# Patient Record
Sex: Female | Born: 1960
Health system: Southern US, Community
[De-identification: ages and names within clinical notes are randomized; demographics above are authoritative.]

## PROBLEM LIST (undated history)

## (undated) DIAGNOSIS — N6002 Solitary cyst of left breast: Secondary | ICD-10-CM

## (undated) DIAGNOSIS — K3189 Other diseases of stomach and duodenum: Secondary | ICD-10-CM

## (undated) DIAGNOSIS — K649 Unspecified hemorrhoids: Secondary | ICD-10-CM

## (undated) DIAGNOSIS — I6523 Occlusion and stenosis of bilateral carotid arteries: Secondary | ICD-10-CM

## (undated) DIAGNOSIS — E785 Hyperlipidemia, unspecified: Secondary | ICD-10-CM

## (undated) DIAGNOSIS — M65319 Trigger thumb, unspecified thumb: Secondary | ICD-10-CM

## (undated) DIAGNOSIS — R109 Unspecified abdominal pain: Secondary | ICD-10-CM

## (undated) DIAGNOSIS — R7301 Impaired fasting glucose: Secondary | ICD-10-CM

## (undated) DIAGNOSIS — T7840XA Allergy, unspecified, initial encounter: Secondary | ICD-10-CM

## (undated) DIAGNOSIS — B029 Zoster without complications: Secondary | ICD-10-CM

## (undated) DIAGNOSIS — R569 Unspecified convulsions: Secondary | ICD-10-CM

## (undated) DIAGNOSIS — N200 Calculus of kidney: Secondary | ICD-10-CM

## (undated) DIAGNOSIS — R55 Syncope and collapse: Secondary | ICD-10-CM

## (undated) DIAGNOSIS — G629 Polyneuropathy, unspecified: Secondary | ICD-10-CM

## (undated) DIAGNOSIS — R10A0 Flank pain, unspecified side: Secondary | ICD-10-CM

## (undated) DIAGNOSIS — J309 Allergic rhinitis, unspecified: Secondary | ICD-10-CM

## (undated) DIAGNOSIS — K219 Gastro-esophageal reflux disease without esophagitis: Secondary | ICD-10-CM

## (undated) HISTORY — PX: FOOT SURGERY: SHX648

## (undated) HISTORY — DX: Allergic rhinitis, unspecified: J30.9

## (undated) HISTORY — DX: Allergy, unspecified, initial encounter: T78.40XA

## (undated) HISTORY — DX: Gastro-esophageal reflux disease without esophagitis: K21.9

## (undated) HISTORY — DX: Flank pain, unspecified side: R10.A0

## (undated) HISTORY — DX: Unspecified hemorrhoids: K64.9

## (undated) HISTORY — DX: Syncope and collapse: R55

## (undated) HISTORY — DX: Zoster without complications: B02.9

## (undated) HISTORY — DX: Other diseases of stomach and duodenum: K31.89

## (undated) HISTORY — DX: Calculus of kidney: N20.0

## (undated) HISTORY — PX: BREAST SURGERY: SHX581

## (undated) HISTORY — DX: Unspecified abdominal pain: R10.9

## (undated) HISTORY — DX: Impaired fasting glucose: R73.01

## (undated) HISTORY — DX: Trigger thumb, unspecified thumb: M65.319

## (undated) HISTORY — DX: Solitary cyst of left breast: N60.02

## (undated) HISTORY — DX: Polyneuropathy, unspecified: G62.9

## (undated) HISTORY — DX: Hyperlipidemia, unspecified: E78.5

## (undated) HISTORY — PX: ABDOMINAL HYSTERECTOMY: SHX81

## (undated) HISTORY — PX: CHOLECYSTECTOMY: SHX55

## (undated) HISTORY — PX: BREAST EXCISIONAL BIOPSY: SUR124

## (undated) HISTORY — DX: Occlusion and stenosis of bilateral carotid arteries: I65.23

---

## 1998-06-07 ENCOUNTER — Observation Stay (HOSPITAL_COMMUNITY): Admission: RE | Admit: 1998-06-07 | Discharge: 1998-06-08 | Payer: Self-pay | Admitting: Gynecology

## 2000-01-03 ENCOUNTER — Emergency Department (HOSPITAL_COMMUNITY): Admission: EM | Admit: 2000-01-03 | Discharge: 2000-01-03 | Payer: Self-pay | Admitting: Emergency Medicine

## 2000-01-03 ENCOUNTER — Encounter: Payer: Self-pay | Admitting: Emergency Medicine

## 2001-09-24 ENCOUNTER — Ambulatory Visit (HOSPITAL_COMMUNITY): Admission: RE | Admit: 2001-09-24 | Discharge: 2001-09-24 | Payer: Self-pay | Admitting: *Deleted

## 2001-09-24 ENCOUNTER — Encounter: Payer: Self-pay | Admitting: *Deleted

## 2006-02-28 ENCOUNTER — Ambulatory Visit: Payer: Self-pay | Admitting: Family Medicine

## 2006-03-11 ENCOUNTER — Ambulatory Visit: Payer: Self-pay | Admitting: Family Medicine

## 2006-12-11 ENCOUNTER — Ambulatory Visit: Payer: Self-pay

## 2007-03-25 ENCOUNTER — Ambulatory Visit: Payer: Self-pay | Admitting: Family Medicine

## 2007-11-02 DIAGNOSIS — K219 Gastro-esophageal reflux disease without esophagitis: Secondary | ICD-10-CM | POA: Insufficient documentation

## 2007-11-02 DIAGNOSIS — R3129 Other microscopic hematuria: Secondary | ICD-10-CM | POA: Insufficient documentation

## 2007-12-23 DIAGNOSIS — M1990 Unspecified osteoarthritis, unspecified site: Secondary | ICD-10-CM | POA: Insufficient documentation

## 2008-05-24 ENCOUNTER — Ambulatory Visit: Payer: Self-pay

## 2008-06-08 ENCOUNTER — Ambulatory Visit: Payer: Self-pay

## 2008-10-28 ENCOUNTER — Ambulatory Visit: Payer: Self-pay

## 2008-11-20 ENCOUNTER — Emergency Department (HOSPITAL_COMMUNITY): Admission: EM | Admit: 2008-11-20 | Discharge: 2008-11-20 | Payer: Self-pay | Admitting: Emergency Medicine

## 2008-11-21 ENCOUNTER — Emergency Department: Payer: Self-pay | Admitting: Emergency Medicine

## 2008-11-22 DIAGNOSIS — Z0000 Encounter for general adult medical examination without abnormal findings: Secondary | ICD-10-CM | POA: Insufficient documentation

## 2008-11-22 DIAGNOSIS — Z139 Encounter for screening, unspecified: Secondary | ICD-10-CM | POA: Insufficient documentation

## 2009-01-27 ENCOUNTER — Ambulatory Visit: Payer: Self-pay | Admitting: Family Medicine

## 2009-08-03 ENCOUNTER — Emergency Department (HOSPITAL_BASED_OUTPATIENT_CLINIC_OR_DEPARTMENT_OTHER): Admission: EM | Admit: 2009-08-03 | Discharge: 2009-08-03 | Payer: Self-pay | Admitting: Emergency Medicine

## 2009-08-04 ENCOUNTER — Emergency Department: Payer: Self-pay | Admitting: Internal Medicine

## 2009-08-08 ENCOUNTER — Ambulatory Visit: Payer: Self-pay | Admitting: Internal Medicine

## 2009-08-08 ENCOUNTER — Encounter: Payer: Self-pay | Admitting: Cardiology

## 2010-04-13 DIAGNOSIS — E7439 Other disorders of intestinal carbohydrate absorption: Secondary | ICD-10-CM | POA: Insufficient documentation

## 2010-04-17 ENCOUNTER — Ambulatory Visit: Payer: Self-pay | Admitting: Family Medicine

## 2010-04-30 ENCOUNTER — Ambulatory Visit: Payer: Self-pay | Admitting: Family Medicine

## 2010-08-29 ENCOUNTER — Emergency Department: Payer: Self-pay | Admitting: Emergency Medicine

## 2010-10-30 ENCOUNTER — Ambulatory Visit: Payer: Self-pay | Admitting: Cardiovascular Disease

## 2010-10-30 ENCOUNTER — Encounter: Payer: Self-pay | Admitting: Cardiology

## 2010-10-30 ENCOUNTER — Observation Stay: Payer: Self-pay | Admitting: Internal Medicine

## 2010-10-31 ENCOUNTER — Encounter: Payer: Self-pay | Admitting: Cardiology

## 2010-11-07 ENCOUNTER — Ambulatory Visit: Payer: Self-pay | Admitting: Cardiology

## 2010-11-07 ENCOUNTER — Telehealth: Payer: Self-pay | Admitting: Cardiology

## 2010-11-07 ENCOUNTER — Encounter: Payer: Self-pay | Admitting: Cardiology

## 2010-11-07 DIAGNOSIS — R55 Syncope and collapse: Secondary | ICD-10-CM | POA: Insufficient documentation

## 2010-11-08 ENCOUNTER — Telehealth (INDEPENDENT_AMBULATORY_CARE_PROVIDER_SITE_OTHER): Payer: Self-pay | Admitting: *Deleted

## 2010-11-09 ENCOUNTER — Telehealth: Payer: Self-pay | Admitting: Cardiology

## 2010-11-12 ENCOUNTER — Telehealth: Payer: Self-pay | Admitting: Cardiology

## 2010-12-07 ENCOUNTER — Ambulatory Visit: Payer: Self-pay | Admitting: Cardiovascular Disease

## 2010-12-07 ENCOUNTER — Encounter: Payer: Self-pay | Admitting: Cardiovascular Disease

## 2010-12-07 DIAGNOSIS — R079 Chest pain, unspecified: Secondary | ICD-10-CM | POA: Insufficient documentation

## 2011-01-09 ENCOUNTER — Ambulatory Visit
Admission: RE | Admit: 2011-01-09 | Discharge: 2011-01-09 | Payer: Self-pay | Source: Home / Self Care | Attending: Cardiovascular Disease | Admitting: Cardiovascular Disease

## 2011-01-09 ENCOUNTER — Emergency Department: Payer: Self-pay | Admitting: Emergency Medicine

## 2011-01-09 ENCOUNTER — Encounter: Payer: Self-pay | Admitting: Cardiovascular Disease

## 2011-01-09 DIAGNOSIS — R42 Dizziness and giddiness: Secondary | ICD-10-CM | POA: Insufficient documentation

## 2011-01-31 NOTE — Letter (Addendum)
SummaryScientist, physiological [facility] Instructions    [facility]   Imported By: Roderic Ovens 11/12/2010 10:27:02  _____________________________________________________________________  External Attachment:    Type:   Image     Comment:   External Document

## 2011-01-31 NOTE — Assessment & Plan Note (Addendum)
Summary: NP6/AMD   Visit Type:  Initial Consult Primary Provider:  Steiner,Beat  CC:  F/U [facility].  c/o syncope, chest pain, and shortness of breath and rapid heart beats..  History of Present Illness: This patient has 2 charts.  Office note was put in the other chart.   Preventive Screening-Counseling & Management  Alcohol-Tobacco     Smoking Status: never  Caffeine-Diet-Exercise     Does Patient Exercise: yes      Drug Use:  no.    Current Medications (verified): 1)  Metoprolol Tartrate 25 Mg Tabs (Metoprolol Tartrate) .... 1/2 Tablet Once Daily 2)  Motrin Ib 200 Mg Tabs (Ibuprofen) .... Two Tablets Three Times A Day  Allergies (verified): 1)  ! Vicodin (Hydrocodone-Acetaminophen) 2)  ! Hydrocodone-Acetaminophen (Hydrocodone-Acetaminophen) 3)  ! Flagyl  Past History:  Family History: Last updated: 11/07/2010 Father: Living; hypertension. Mother: Living; hypertension & diabetes.  Social History: Last updated: 11/07/2010 Full Time Single  Tobacco Use - No.  Alcohol Use - yes Regular Exercise - yes--stat. bike 3 x weekly. Drug Use - no  Risk Factors: Exercise: yes (11/07/2010)  Risk Factors: Smoking Status: never (11/07/2010)  Past Medical History: syncope  Past Surgical History: partial hysterectomy foot surgery cholecystectomy  Family History: Father: Living; hypertension. Mother: Living; hypertension & diabetes.  Social History: Full Time Single  Tobacco Use - No.  Alcohol Use - yes Regular Exercise - yes--stat. bike 3 x weekly. Drug Use - no Smoking Status:  never Does Patient Exercise:  yes Drug Use:  no  Vital Signs:  Patient profile:   50 year old female Height:      59 inches Weight:      140 pounds BMI:     28.38 Pulse rate:   65 / minute BP supine:   102 / 80  (left arm) BP sitting:   102 / 78  (left arm) BP standing:   100 / 78  (left arm) Cuff size:   regular  Vitals Entered By: Bishop Dublin, CMA (November 07, 2010 10:44  AM)  Serial Vital Signs/Assessments:  Time      Position  BP       Pulse  Resp  Temp     By                     120/70                         Bishop Dublin, CMA    Patient Instructions: 1)  Your physician recommends that you schedule a follow-up appointment in: 1 month 2)  Your physician has recommended you make the following change in your medication: Stop Metoprolol and Start .1mg  Fludrocortisone and take 1 tablet per day. Prescriptions: FLUDROCORTISONE ACETATE 0.1 MG TABS (FLUDROCORTISONE ACETATE) one tablet once daily  #30 x 3   Entered by:   West Carbo   Authorized by:   Marca Ancona, MD   Signed by:   West Carbo on 11/07/2010   Method used:   Electronically to        [facility] # [phone]* (retail)       [address] [place], [place]  [address]       Ph: [phone]       Fax: [phone]   RxID:   [id number]

## 2011-01-31 NOTE — Letter (Addendum)
SummaryScientist, physiological [facility]   [facility]   Imported By: Roderic Ovens 11/12/2010 10:29:08  _____________________________________________________________________  External Attachment:    Type:   Image     Comment:   External Document

## 2011-01-31 NOTE — Progress Notes (Addendum)
Summary: CALLED PT  Phone Note Outgoing Call Call back at [facility] Phone [phone]   Call placed by: Harlon Flor,  November 08, 2010 10:55 AM Call placed to: Patient Summary of Call: [facility] TO SCHEDULE 1 MONTH F/U Initial call taken by: Harlon Flor,  November 08, 2010 10:56 AM

## 2011-01-31 NOTE — Assessment & Plan Note (Signed)
Summary: ROV/AMD   Visit Type:  Follow-up Primary Provider:  Dr. Madaline Guthrie, Bernestine Amass  CC:  c/o dizziness and blacking out. Just released from [facility] today.Marland Kitchen  History of Present Illness: 50 yo with history of recurrent syncope,  with 5-6 syncopal episodes over the last 2 years, hospitalized in 8/11 and in 11/11 with workup at [facility] with cardiology and neurology, Echo and 3-week event monitor  unremarkable, echo at [facility] in 11/11 that was unremarkable, an EEG that was normal.    Episodes for the most part have occurred while standing.  They are preceded by chest pain and/or lightheadedness.  This prodrome tends to come before each episode.  Her syncope has been deemed most likely to be neurocardiogenic.  She was encouraged to keep herself hydrated and to wear support hose.  She was started on a low dose of metoprolol once a day.   On her last visit, we did prescribe Florinef. She does not seem particularly excited to try Florinef or midodrine.   She had an additional episode today while at work. She felt dizzy, laid down and then had syncope while lying down. She was seen in the emergency room and all routine testing was normal.  ECG: NSR, rate of 56 beats per minute, no significant ST or T wave changes \  Preventive Screening-Counseling & Management  Caffeine-Diet-Exercise     Does Patient Exercise: yes  Current Medications (verified): 1)  Metoprolol Tartrate 25 Mg Tabs (Metoprolol Tartrate) .... Take 1/2 Tablet By Mouth Twice A Day  Allergies (verified): 1)  ! Vicodin 2)  ! * Hydrocodone-Acetaminophen 3)  ! Flagyl  Past History:  Family History: Last updated: 01/09/2011 No premature coronary disease  Social History: Last updated: 01/09/2011 Work in a Associate Professor. 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  The patient denies fever, weight loss, weight gain, vision loss, decreased hearing, hoarseness, chest pain, dyspnea on exertion, peripheral edema, prolonged cough, abdominal pain, incontinence, muscle weakness, depression, and enlarged lymph nodes.         lightheaded prior to syncope  Vital Signs:  Patient profile:   50 year old female Height:      59 inches Weight:      147 pounds BMI:     29.80 Pulse rate:   56 / minute BP supine:   119 / 75  (left arm) BP sitting:   128 / 83  (left arm) BP standing:   132 / 82  (left arm) Cuff size:   regular  Vitals Entered By: Lysbeth Galas CMA (January 09, 2011 11:44 AM)  Physical Exam  General:  Well developed, well nourished, in no acute distress. Head:  normocephalic and atraumatic Neck:  Neck supple, no JVD. No masses, thyromegaly or abnormal cervical nodes. Lungs:  Clear bilaterally to auscultation and percussion. Heart:  Non-displaced PMI, chest non-tender; regular rate and rhythm, S1, S2 without murmurs, rubs or gallops. Carotid upstroke normal, no bruit.  Pedals normal pulses. No edema, no varicosities. Abdomen:  Bowel sounds positive; abdomen soft and non-tender without masses Msk:  Back normal, normal gait. Muscle strength and tone normal.  Pulses:  pulses normal in all 4 extremities Extremities:  No clubbing or cyanosis. Neurologic:  Alert and oriented x 3. Skin:  Intact without lesions or rashes. Psych:  Normal affect.   Impression & Recommendations:  Problem # 1:  SYNCOPE AND COLLAPSE (ICD-780.2) repeat episode of syncope today with a warning prior to the passing out. Continued suspicion for neurocardiogenic syncope. She does not have a blood pressure cuff, has not tried Florinef or midodrine, is  not using TED hose. I suggested she purchase a blood pressure cuff to monitor her blood pressure, wear TED hose and will call in a prescription again for Florinef. We have suggested she try Florinef 0.1 mg 3 times a week, titrating up to 4 times a week after 2 weeks and call us at that time.  We'll continue the low-dose metoprolol tartrate 12.5 mg b.i.d.  Her updated medication list for this problem includes:    Metoprolol Tartrate 25 Mg Tabs (Metoprolol tartrate) .Marland Kitchen... Take 1/2 tablet by mouth twice a day  Other Orders: EKG w/ Interpretation (93000)  Patient Instructions: 1)  Your physician recommends that you schedule a follow-up appointment in: 6 months 2)  Your physician has recommended you make the following change in your medication: START Florinef 0.1mg  Mon,Wed, Fri for 2 weeks then INCREASE  to SAT, MON, WED, FRI. Prescriptions: METOPROLOL TARTRATE 25 MG TABS (METOPROLOL TARTRATE) Take one tablet by mouth twice a day  #60 x 6   Entered by:   Lanny Hurst RN   Authorized by:   Dossie Arbour MD   Signed by:   Lanny Hurst RN on 01/09/2011   Method used:   [facility]  [address] [address] [phone][facility] (retail[facility]       [address]       [place], [place]  [address]       Ph: [phone] or [phone]       Fax: [phone]   RxID:   [id number] FLUDROCORTISONE ACETATE 0.1 MG TABS (FLUDROCORTISONE ACETATE) Take 1 tablet by mouth once daily  #30 x 6   Entered by:   Lanny Hurst RN   Authorized by:   Dossie Arbour MD   Signed by:   Lanny Hurst RN on 01/09/2011   Method used:   [facility]        [facility]  [address] [phone].* (retail)       [address]       [place], [place]  [address]       Ph: [phone] or [phone]       Fax: [phone]   RxID:   [id number]

## 2011-01-31 NOTE — Letter (Signed)
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 [address]   [place], [place] [address]   Phone: [phone]  Fax: [phone]     January 09, 2011      [facility]     The above named patient had a medical visit today at: 11:15  am. Please also excuse patient tomorrow, 01/10/11.  Please take this into consideration when reviewing the time away from work.      Sincerely yours,         Dr. Dossie Arbour [facility]

## 2011-01-31 NOTE — Assessment & Plan Note (Addendum)
Summary: [facility]   Visit Type:  Follow-up Primary Elizabeth Mata:  Dr. Madaline Guthrie, Bernestine Amass  CC:  Establish care. c/o chest pain and fatigue. Denies SOB.Marland Kitchen  History of Present Illness: 50 yo with history of recurrent syncope,  with 5-6 syncopal episodes over the last 2 years, hospitalized in 8/11 and in 11/11 with workup at [facility] with cardiology and neurology, Echo and 3-week event monitor  unremarkable, echo at [facility] in 11/11 that was unremarkable, an EEG that was normal.    Episodes for the most part have occurred while standing.  They are preceded by chest pain and lightheadedness.  This prodrome tends to come before each episode.  her syncope has been deemed most likely to be neurocardiogenic.  She was encouraged to keep herself hydrated and to wear support hose.  She was started on a low dose of metoprolol once a day.    she has had no further syncope, but she feels fatigued, weak, and lightheaded with standing.  She attributes this to metoprolol.  she has not tried Florinef, also has not been taking midodrine. she is scared of the side effects of these medications. She also reports episodes of chest pain and dizziness.  She wonders if her symptoms could be secondary to menopausal changes.  ECG: NSR, normal, rate of 57 beats per minute  Current Medications (verified): 1)  Motrin Ib 200 Mg Tabs (Ibuprofen) .... Two Tablets Three Times A Day 2)  Midodrine Hcl 2.5 Mg Tabs (Midodrine Hcl) .... One Tablet Three Times A Day 3)  Metoprolol Tartrate 25 Mg Tabs (Metoprolol Tartrate) .... 1/2 Tablet Daily  Allergies (verified): 1)  ! Vicodin (Hydrocodone-Acetaminophen) 2)  ! Hydrocodone-Acetaminophen (Hydrocodone-Acetaminophen) 3)  ! Flagyl  Past History:  Past Medical History: Last updated: 11/07/2010 1. HTN 2. Anemia 3. Syncope: Recurrent over the last 2 years.  Had workup at [facility] this year with cardiology and neurology.  Had echo and 3-week event monitor that were unremarkable.   Has had negative EEG.  Echo in 11/11 at [facility] showed no significant abnormalities. Possibly vasovagal.   Past Surgical History: Last updated: 11/07/2010 partial hysterectomy foot surgery cholecystectomy  Family History: Last updated: 11/07/2010 No premature coronary disease.   Social History: Last updated: 12/07/2010 Works in a Associate Professor.  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  Single   Risk Factors: Exercise: yes (11/07/2010)  Risk Factors: Smoking Status: never (11/07/2010)  Social History: Works in a Associate Professor.  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  Single   Review of Systems  The patient denies fever, weight loss, weight gain, vision loss, decreased hearing, hoarseness, chest pain, syncope, dyspnea on exertion, peripheral edema, prolonged cough, abdominal pain, incontinence, muscle weakness, depression, and enlarged lymph nodes.         dizzy, vgue chest pain at rest, fatigue  Vital Signs:  Patient profile:   50 year old female Height:      59 inches Weight:      144 pounds BMI:     29.19 Pulse rate:   57 / minute BP sitting:   122 / 60  (left arm) Cuff size:   regular  Vitals Entered By: Lysbeth Galas CMA (December 07, 2010 10:10 AM)  Physical Exam  General:  Well developed, well nourished, in no acute distress. Head:  normocephalic and atraumatic Neck:  Neck supple, no JVD. No masses, thyromegaly or abnormal cervical nodes. Lungs:  Clear bilaterally to auscultation and percussion. Heart:  Non-displaced PMI, chest non-tender; regular rate and rhythm,  S1, S2 without murmurs, rubs or gallops. Carotid upstroke normal, no bruit.Pedals normal pulses. No edema, no varicosities. Abdomen:  Bowel sounds positive; abdomen soft and non-tender without masses Msk:  Back normal, normal gait. Muscle strength and tone normal. Pulses:  pulses normal in all 4 extremities Extremities:  No clubbing or cyanosis. Neurologic:  Alert and oriented x 3. Skin:  Intact without  lesions or rashes. Psych:  Normal affect.   Impression & Recommendations:  Problem # 1:  SYNCOPE AND COLLAPSE (ICD-780.2) current therapy is that she has neurocardiogenic syncope. She has had extensive workup. We have encouraged TED hose, hydration, salt loading, and if symptoms persist, starting Florinef. Continue low-dose metoprolol.  Her updated medication list for this problem includes:    Metoprolol Tartrate 25 Mg Tabs (Metoprolol tartrate) .Marland Kitchen... 1/2 tablet daily  Orders: EKG w/ Interpretation (93000)  Problem # 2:  CHEST PAIN UNSPECIFIED (ICD-786.50) Etiology of chest pain is likely noncardiac. I encouraged her to continue exercise. She may benefit from evaluation by primary care physician to determine if her symptoms would improve on hormone supplementation.  Her updated medication list for this problem includes:    Metoprolol Tartrate 25 Mg Tabs (Metoprolol tartrate) .Marland Kitchen... 1/2 tablet daily  Orders: EKG w/ Interpretation (93000)  Patient Instructions: 1)  Your physician recommends that you schedule a follow-up appointment in: 1 year 2)  Your physician has recommended you make the following change in your medication: Stop Midrinone. Start Florinef 0.1mg  by mouth two times a day. Prescriptions: FLUDROCORTISONE ACETATE 0.1 MG TABS (FLUDROCORTISONE ACETATE) Take one tablet by mouth two times a day  #60 x 6   Entered by:   Lanny Hurst RN   Authorized by:   Dossie Arbour MD   Signed by:   Lanny Hurst RN on 12/07/2010   Method used:   [facility]ron[facility] to        [facility] # [phone]* (retail)       [address] [place], [place]  [address]       Ph: [phone]       Fax: [phone]   RxID:   [id number]   Appended Document: [facility] No further studies are needed. An electrophysiology study would be unrevealing as she has no known arrhythmia. No arrhythmia has been noted on event monitor or telemetry.

## 2011-01-31 NOTE — Progress Notes (Addendum)
Summary: RETURNING CALL  Phone Note Call from Patient Call back at Home Phone [phone]   Caller: SELF Call For: [facility] Summary of Call: PT RETURNING CALL TO [facility] Initial call taken by: Harlon Flor,  November 12, 2010 3:44 PM    New/Updated Medications: MIDODRINE HCL 2.5 MG TABS (MIDODRINE HCL) one tablet three times a day Prescriptions: MIDODRINE HCL 2.5 MG TABS (MIDODRINE HCL) one tablet three times a day  #90 x 1   Entered by:   Bishop Dublin, CMA   Authorized by:   Marca Ancona, MD   Signed by:   Bishop Dublin, CMA on 11/12/2010   Method used:   [facility]        [facility] # [phone]* (retail)       [address] [place], [place]  [address]       Ph: [phone]       Fax: [phone]   RxID:   [id number]

## 2011-01-31 NOTE — Consult Note (Addendum)
SummaryScientist, physiological [facility]   [facility]   Imported By: Roderic Ovens 11/12/2010 10:24:29  _____________________________________________________________________  External Attachment:    Type:   Image     Comment:   External Document

## 2011-01-31 NOTE — Assessment & Plan Note (Addendum)
Summary: NP6/AMD   Primary Provider:  Dr. Madaline Guthrie, Bernestine Amass   History of Present Illness: 50 yo with history of recurrent syncope presents to establish outpatient cardiology care.  She has had 5-6 syncopal episodes over the last 2 years.  She was hospitalized in 8/11 and in 11/11.  She had a workup this summer at [facility] with cardiology and neurology.  Echo and 3-week event monitor were unremarkable.  She had an echo at [facility] in 11/11 that was unremarkable.  She has had an EEG that was normal.  During the time she spent in the hospital at [facility], telemetry was unremarkable.    Episodes for the most part have occurred while standing.  They are preceded by chest pain and lightheadedness.  This prodrome tends to come before each episode.  She was seen by Dr. Mariah Milling in the hospital.  After the extensive workup she has had over the last few months, her syncope has been deemed most likely to be neurocardiogenic.  She was encouraged to keep herself hydrated and to wear support hose.  She was started on a low dose of metoprolol once a day. Since discharge, she has had no further syncope, but she feels fatigued, weak, and lightheaded with standing.  She attributes this to metoprolol.    ECG: NSR, normal  Past History:  Past Medical History: 1. HTN 2. Anemia 3. Syncope: Recurrent over the last 2 years.  Had workup at [facility] this year with cardiology and neurology.  Had echo and 3-week event monitor that were unremarkable.  Has had negative EEG.  Echo in 11/11 at [facility] showed no significant abnormalities. Possibly vasovagal.   Family History: No premature coronary disease.   Social History: Works in a Associate Professor.  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   Review of Systems       All systems reviewed and negative except as per HPI.   Vital Signs:  Patient profile:   49 year old female Pulse rate:   65 / minute BP supine:   120 / 80  Physical Exam  General:  Well developed, well nourished, in no  acute distress. Head:  normocephalic and atraumatic Nose:  no deformity, discharge, inflammation, or lesions Mouth:  Teeth, gums and palate normal. Oral mucosa normal. Neck:  Neck supple, no JVD. No masses, thyromegaly or abnormal cervical nodes. Lungs:  Clear bilaterally to auscultation and percussion. Heart:  Non-displaced PMI, chest non-tender; regular rate and rhythm, S1, S2 without murmurs, rubs or gallops. Carotid upstroke normal, no bruit.Pedals normal pulses. No edema, no varicosities. Abdomen:  Bowel sounds positive; abdomen soft and non-tender without masses, organomegaly, or hernias noted. No hepatosplenomegaly. Msk:  Back normal, normal gait. Muscle strength and tone normal. Extremities:  No clubbing or cyanosis. Neurologic:  Alert and oriented x 3. Skin:  Intact without lesions or rashes. Psych:  Normal affect.   Impression & Recommendations:  Problem # 1:  SYNCOPE AND COLLAPSE (ICD-780.2) Patient has had extensive but unrevealing workup of her syncope.  It always occurs while standing and is preceded by a prodrome.  I agree that it seems most likely to be vasovagal.  No evidence for arrhythmia on telemetry evaluation as an inpatient or on 3-week monitor as outpatient.  She needs to continue to hydrate herself well and to avoid prolonged standing.  When she feels the prodrome, she should lie down and raise her legs.  Ted hose would be reasonable to wear and she should liberalize salt intake.  Finally, I  will start a trial of low-dose fludrocortisone 0.1 mg daily (if this fails, low-dose midodrine would be an option).  I think that a significant amount of her new fatigue and lightheadedness may be related to metoprolol, so I will stop this medicine.

## 2011-01-31 NOTE — Progress Notes (Addendum)
Summary: MEDICATION  Phone Note Call from Patient Call back at Home Phone [phone] Call back at Work Phone [phone]   Caller: SELF Call For: [facility] Summary of Call: PT STILL HAS NOT HEARD ANYTHING BACK ON THE MEDICATION THAT SHE DID NOT WANT TO TAKE-UPSET THAT THE MEDICATION WAS CALLED IN ANYWAY-UPSET THAT SHE HAS NOT HEARD ANYTHING BACK-STATES THAT IT IS UNPROFESSIONAL THAT SHE WAS SUPPOSED TO RECEIVE A CALL ON WEDNESDAY AND HAS NOT HEARD ANYTHING YET. Initial call taken by: Harlon Flor,  November 09, 2010 11:46 AM  Follow-up for Phone Call        [facility] Follow-up by: Bishop Dublin, CMA,  November 09, 2010 11:57 AM     Appended Document: MEDICATION This patient has 2 medical record numbers.  I left a message for Jasmine December in her other chart (that needs to be removed because she does not need 2 charts).  Fludrocortisone will not cause any permanent problems if it causes her to swell.  She will probably do fine with it and could stop it if there is swelling.  If she does not want to use this, she can try midodrine 2.5 mg three times a day.  Otherwise, she could try to use compression stockings and push salt in her diet.   Appended Document: MEDICATION [facility].  Appended Document: MEDICATION Notified patient of message with medication interactions.  She suggested she take another medication because she does not want to swell at all.  Per Dr. Alford Highland office note we will call in the midodrine hydrochloride 2.5 mg three times a day.

## 2011-01-31 NOTE — Progress Notes (Addendum)
Summary: MEDICATIONS  Phone Note Call from Patient Call back at Home Phone [phone]   Caller: self Call For: [facility] Summary of Call: WOULD LIKE A CALL BACK REGARDING THE MEDICATIONS THAT ARE BEING CALLED IN FOR HER Initial call taken by: Harlon Flor,  November 07, 2010 12:15 PM  Follow-up for Phone Call        notified patient of medication.  She is concerned about swelling.  The pharmacist told will cause some swelling. Follow-up by: [place],  November 07, 2010 12:28 PM  Additional Follow-up for Phone Call Additional follow up Details #1::        Spoke with patient.  She is concerned over any medication that may cause any swelling because she does not want weight gain.  She is not going to take fludrocortisone and wants another option.   Weston Brass PharmD  November 07, 2010 3:38 PM      Appended Document: MEDICATIONS She can try midodrine 2.5 mg three times a day.  Fludrocortisone is really a better option is not likely to cause any harmful side effects.   Appended Document: MEDICATIONS Notified patient of a different medication she could try that has less side effects.  She will try the  midodrine instead of the fludrocortisone.  Appended Document: MEDICATIONS    Clinical Lists Changes  Medications: Removed medication of FLUDROCORTISONE ACETATE 0.1 MG TABS (FLUDROCORTISONE ACETATE) one tablet once daily Added new medication of MIDODRINE HCL 2.5 MG TABS (MIDODRINE HCL) Take 1 tablet by mouth three times a day - Signed Rx of MIDODRINE HCL 2.5 MG TABS (MIDODRINE HCL) Take 1 tablet by mouth three times a day;  #90 x 3;  Signed;  Entered by: Cloyde Reams RN;  Authorized by: Marca Ancona, MD;  Method used: Electronically to [facility] # [id number]*, [address] [place], [place], [place]  [address], Ph: [phone], Fax: [phone]    Prescriptions: MIDODRINE HCL 2.5 MG TABS (MIDODRINE HCL) Take 1 tablet by mouth three times a day  #90 x 3   Entered by:   Cloyde Reams RN   Authorized by:   Marca Ancona, MD   Signed by:   Cloyde Reams RN on 11/13/2010   Method used:   Electronically to        [facility] # [phone]* (retail)       [address] [place], [place]  [address]       Ph: [phone]       Fax: [phone]   RxID:   [id number]

## 2011-04-23 ENCOUNTER — Ambulatory Visit: Payer: Self-pay | Admitting: Family Medicine

## 2011-05-13 ENCOUNTER — Ambulatory Visit: Payer: Self-pay | Admitting: Family Medicine

## 2011-06-21 ENCOUNTER — Emergency Department: Payer: Self-pay | Admitting: Emergency Medicine

## 2011-06-24 ENCOUNTER — Emergency Department: Payer: Self-pay | Admitting: Emergency Medicine

## 2011-07-16 ENCOUNTER — Encounter: Payer: Self-pay | Admitting: Cardiovascular Disease

## 2011-10-02 LAB — URINALYSIS, ROUTINE W REFLEX MICROSCOPIC
Bilirubin Urine: NEGATIVE
Glucose, UA: NEGATIVE
Ketones, ur: NEGATIVE
Leukocytes, UA: NEGATIVE
Nitrite: NEGATIVE
Protein, ur: NEGATIVE
Specific Gravity, Urine: 1.008
Urobilinogen, UA: 0.2
pH: 6.5

## 2011-10-02 LAB — CBC
HCT: 39.7
Hemoglobin: 13.5
MCHC: 34.1
MCV: 95.9
Platelets: 384
RBC: 4.14
RDW: 13.1
WBC: 5.4

## 2011-10-02 LAB — POCT I-STAT, CHEM 8
BUN: 8
Calcium, Ion: 1.28
Chloride: 107
Creatinine, Ser: 0.9
Glucose, Bld: 102 — ABNORMAL HIGH
HCT: 40
Hemoglobin: 13.6
Potassium: 3.7
Sodium: 139
TCO2: 25

## 2011-10-02 LAB — DIFFERENTIAL
Basophils Absolute: 0.1
Basophils Relative: 1
Eosinophils Absolute: 0
Eosinophils Relative: 1
Lymphocytes Relative: 38
Lymphs Abs: 2
Monocytes Absolute: 0.2
Monocytes Relative: 4
Neutro Abs: 3.1
Neutrophils Relative %: 57

## 2011-10-02 LAB — URINE MICROSCOPIC-ADD ON

## 2011-10-02 LAB — GLUCOSE, CAPILLARY: Glucose-Capillary: 125 — ABNORMAL HIGH

## 2011-11-14 DIAGNOSIS — R109 Unspecified abdominal pain: Secondary | ICD-10-CM | POA: Insufficient documentation

## 2011-11-14 DIAGNOSIS — R1084 Generalized abdominal pain: Secondary | ICD-10-CM | POA: Insufficient documentation

## 2011-12-26 DIAGNOSIS — M25569 Pain in unspecified knee: Secondary | ICD-10-CM | POA: Insufficient documentation

## 2012-04-17 DIAGNOSIS — M25519 Pain in unspecified shoulder: Secondary | ICD-10-CM | POA: Insufficient documentation

## 2012-05-03 ENCOUNTER — Emergency Department: Payer: Self-pay | Admitting: Unknown Physician Specialty

## 2012-05-03 LAB — COMPREHENSIVE METABOLIC PANEL
Albumin: 3.8 g/dL (ref 3.4–5.0)
Alkaline Phosphatase: 88 U/L (ref 50–136)
Anion Gap: 10 (ref 7–16)
BUN: 16 mg/dL (ref 7–18)
Bilirubin,Total: 0.6 mg/dL (ref 0.2–1.0)
Calcium, Total: 9.6 mg/dL (ref 8.5–10.1)
Chloride: 111 mmol/L — ABNORMAL HIGH (ref 98–107)
Co2: 22 mmol/L (ref 21–32)
Creatinine: 0.86 mg/dL (ref 0.60–1.30)
EGFR (African American): >60
EGFR (Non-African Amer.): >60
Glucose: 91 mg/dL (ref 65–99)
Osmolality: 286 (ref 275–301)
Potassium: 4.1 mmol/L (ref 3.5–5.1)
SGOT(AST): 30 U/L (ref 15–37)
SGPT (ALT): 25 U/L
Sodium: 143 mmol/L (ref 136–145)
Total Protein: 7.8 g/dL (ref 6.4–8.2)

## 2012-05-03 LAB — URINALYSIS, COMPLETE
Bacteria: NEGATIVE
Bilirubin,UR: NEGATIVE
Glucose,UR: NEGATIVE mg/dL (ref 0–75)
Ketone: NEGATIVE
Leukocyte Esterase: NEGATIVE
Nitrite: NEGATIVE
Ph: 7 (ref 4.5–8.0)
Protein: NEGATIVE
Specific Gravity: 1.004 (ref 1.003–1.030)
Squamous Epithelial: NONE SEEN
WBC UR: NONE SEEN /HPF (ref 0–5)

## 2012-05-03 LAB — CBC
HCT: 39 % (ref 35.0–47.0)
HGB: 13.6 g/dL (ref 12.0–16.0)
MCH: 32.2 pg (ref 26.0–34.0)
MCHC: 34.7 g/dL (ref 32.0–36.0)
MCV: 93 fL (ref 80–100)
Platelet: 323 10*3/uL (ref 150–440)
RBC: 4.21 10*6/uL (ref 3.80–5.20)
RDW: 13.3 % (ref 11.5–14.5)
WBC: 3.6 10*3/uL (ref 3.6–11.0)

## 2012-05-08 DIAGNOSIS — N200 Calculus of kidney: Secondary | ICD-10-CM | POA: Insufficient documentation

## 2012-05-22 ENCOUNTER — Ambulatory Visit: Payer: Self-pay | Admitting: Family Medicine

## 2012-06-19 ENCOUNTER — Encounter: Payer: Self-pay | Admitting: Family Medicine

## 2012-06-26 ENCOUNTER — Ambulatory Visit: Payer: Self-pay | Admitting: Urology

## 2012-06-29 ENCOUNTER — Encounter: Payer: Self-pay | Admitting: Family Medicine

## 2012-07-06 DIAGNOSIS — G43109 Migraine with aura, not intractable, without status migrainosus: Secondary | ICD-10-CM | POA: Insufficient documentation

## 2012-08-22 ENCOUNTER — Ambulatory Visit: Payer: Self-pay | Admitting: Internal Medicine

## 2013-04-26 DIAGNOSIS — Z1239 Encounter for other screening for malignant neoplasm of breast: Secondary | ICD-10-CM | POA: Insufficient documentation

## 2013-05-04 DIAGNOSIS — R102 Pelvic and perineal pain unspecified side: Secondary | ICD-10-CM | POA: Insufficient documentation

## 2013-05-25 ENCOUNTER — Ambulatory Visit: Payer: Self-pay | Admitting: Family Medicine

## 2013-06-04 ENCOUNTER — Encounter (HOSPITAL_BASED_OUTPATIENT_CLINIC_OR_DEPARTMENT_OTHER): Payer: Self-pay | Admitting: Emergency Medicine

## 2013-06-04 ENCOUNTER — Emergency Department (HOSPITAL_BASED_OUTPATIENT_CLINIC_OR_DEPARTMENT_OTHER)
Admission: EM | Admit: 2013-06-04 | Discharge: 2013-06-04 | Disposition: A | Discharge status: Home / Self Care | Payer: Medicaid Other | Attending: Emergency Medicine | Admitting: Emergency Medicine

## 2013-06-04 DIAGNOSIS — G43909 Migraine, unspecified, not intractable, without status migrainosus: Secondary | ICD-10-CM | POA: Insufficient documentation

## 2013-06-04 DIAGNOSIS — R569 Unspecified convulsions: Secondary | ICD-10-CM

## 2013-06-04 DIAGNOSIS — Z862 Personal history of diseases of the blood and blood-forming organs and certain disorders involving the immune mechanism: Secondary | ICD-10-CM | POA: Insufficient documentation

## 2013-06-04 DIAGNOSIS — Z79899 Other long term (current) drug therapy: Secondary | ICD-10-CM | POA: Insufficient documentation

## 2013-06-04 HISTORY — DX: Unspecified convulsions: R56.9

## 2013-06-04 LAB — CBC WITH DIFFERENTIAL/PLATELET
Basophils Absolute: 0 10*3/uL (ref 0.0–0.1)
Basophils Relative: 1 % (ref 0–1)
Eosinophils Absolute: 0.1 10*3/uL (ref 0.0–0.7)
Eosinophils Relative: 1 % (ref 0–5)
HCT: 37.6 % (ref 36.0–46.0)
Hemoglobin: 13.8 g/dL (ref 12.0–15.0)
Lymphocytes Relative: 58 % — ABNORMAL HIGH (ref 12–46)
Lymphs Abs: 3.3 10*3/uL (ref 0.7–4.0)
MCH: 31.9 pg (ref 26.0–34.0)
MCHC: 36.7 g/dL — ABNORMAL HIGH (ref 30.0–36.0)
MCV: 87 fL (ref 78.0–100.0)
Monocytes Absolute: 0.4 10*3/uL (ref 0.1–1.0)
Monocytes Relative: 7 % (ref 3–12)
Neutro Abs: 2 10*3/uL (ref 1.7–7.7)
Neutrophils Relative %: 34 % — ABNORMAL LOW (ref 43–77)
Platelets: 258 10*3/uL (ref 150–400)
RBC: 4.32 MIL/uL (ref 3.87–5.11)
RDW: 12.1 % (ref 11.5–15.5)
WBC: 5.8 10*3/uL (ref 4.0–10.5)

## 2013-06-04 LAB — BASIC METABOLIC PANEL
BUN: 20 mg/dL (ref 6–23)
CO2: 21 mEq/L (ref 19–32)
Calcium: 10.3 mg/dL (ref 8.4–10.5)
Chloride: 108 mEq/L (ref 96–112)
Creatinine, Ser: 1 mg/dL (ref 0.50–1.10)
GFR calc Af Amer: 75 mL/min — ABNORMAL LOW (ref >90)
GFR calc non Af Amer: 65 mL/min — ABNORMAL LOW (ref >90)
Glucose, Bld: 106 mg/dL — ABNORMAL HIGH (ref 70–99)
Potassium: 3.2 mEq/L — ABNORMAL LOW (ref 3.5–5.1)
Sodium: 141 mEq/L (ref 135–145)

## 2013-06-04 MED ORDER — POTASSIUM CHLORIDE CRYS ER 20 MEQ PO TBCR
40.0000 meq | EXTENDED_RELEASE_TABLET | Freq: Once | ORAL | Status: AC
  Administered 2013-06-04: 40 meq via ORAL
  Filled 2013-06-04: qty 2

## 2013-06-04 MED ORDER — LORAZEPAM 2 MG/ML IJ SOLN
1.0000 mg | Freq: Once | INTRAMUSCULAR | Status: AC
  Administered 2013-06-04: 1 mg via INTRAVENOUS
  Filled 2013-06-04: qty 1

## 2013-06-04 NOTE — ED Notes (Signed)
114/80, 84hr, 16 r, 96cbg, nsr

## 2013-06-04 NOTE — ED Notes (Signed)
Pt reports feeling like she is going to have another seizure, no specific predromal symptoms described, just an akward feeling, pt medicated

## 2013-06-04 NOTE — ED Provider Notes (Signed)
History     CSN: [id number]  Arrival date & time 06/04/13  1916   First MD Initiated Contact with Patient 06/04/13 1926      No chief complaint on file.   (Consider location/radiation/quality/duration/timing/severity/associated sxs/prior treatment) HPI Comments: Patient comes to the ER for evaluation of a seizure. Patient was cleaning her son's house when the episode occurred. She reports that it was very hot in the house, felt like she was going to have a seizure that had a 1 minute long generalized tonic-clonic seizure. Patient has a history of seizure disorder, has not had a seizure for about one year. She takes Topamax daily. She has not had any recent illness. No nausea, vomiting or diarrhea.   Past Medical History  Diagnosis Date  . Anemia   . Syncope     Recurrent over the last 2 years. had workup at [facility] this year with cardiology and neurology. Had echo and 3-week event monitor that were unremarkable. Has had negative EEG. Echo in 11/11 at [facility] showed no significant abnormalities. Possibly vasovagal  . Seizures     Past Surgical History  Procedure Laterality Date  . Partial hysterectomy    . Foot surgery    . Cholecystectomy      Family History  Problem Relation Age of Onset  . Coronary artery disease Neg Hx     History  Substance Use Topics  . Smoking status: Never Smoker   . Smokeless tobacco: Not on file  . Alcohol Use: No    OB History   Grav Para Term Preterm Abortions TAB SAB Ect Mult Living                  Review of Systems  Neurological: Positive for seizures.  All other systems reviewed and are negative.    Allergies  Hydrocodone-acetaminophen and Metronidazole  Home Medications   Current Outpatient Rx  Name  Route  Sig  Dispense  Refill  . fexofenadine (ALLEGRA) 30 MG tablet   Oral   Take 30 mg by mouth 2 (two) times daily.         Marland Kitchen topiramate (TOPAMAX) 100 MG tablet   Oral   Take 150 mg by mouth 2 (two) times daily.          . fludrocortisone (FLORINEF) 0.1 MG tablet   Oral   Take 0.1 mg by mouth 2 (two) times daily.           Marland Kitchen ibuprofen (ADVIL,MOTRIN) 200 MG tablet   Oral   Take 400 mg by mouth 3 (three) times daily.           . metoprolol tartrate (LOPRESSOR) 25 MG tablet   Oral   Take 12.5 mg by mouth 2 (two) times daily.             BP 118/82  Pulse 81  Resp 18  Ht 4\' 11"  (1.499 m)  Wt 137 lb (62.143 kg)  BMI 27.66 kg/m2  SpO2 100%  Physical Exam  Constitutional: She is oriented to person, place, and time. She appears well-developed and well-nourished. No distress.  HENT:  Head: Normocephalic and atraumatic.  Right Ear: Hearing normal.  Left Ear: Hearing normal.  Nose: Nose normal.  Mouth/Throat: Oropharynx is clear and moist and mucous membranes are normal.  Eyes: Conjunctivae and EOM are normal. Pupils are equal, round, and reactive to light.  Neck: Normal range of motion. Neck supple.  Cardiovascular: Regular rhythm, S1 normal and S2 normal.  Exam  reveals no gallop and no friction rub.   No murmur heard. Pulmonary/Chest: Effort normal and breath sounds normal. No respiratory distress. She exhibits no tenderness.  Abdominal: Soft. Normal appearance and bowel sounds are normal. There is no hepatosplenomegaly. There is no tenderness. There is no rebound, no guarding, no tenderness at McBurney's point and negative Murphy's sign. No hernia.  Musculoskeletal: Normal range of motion.  Neurological: She is alert and oriented to person, place, and time. She has normal strength. No cranial nerve deficit or sensory deficit. Coordination normal. GCS eye subscore is 4. GCS verbal subscore is 5. GCS motor subscore is 6.  Skin: Skin is warm, dry and intact. No rash noted. No cyanosis.  Psychiatric: She has a normal mood and affect. Her speech is normal and behavior is normal. Thought content normal.    ED Course  Procedures (including critical care time)  EKG:  Date: 06/04/2013  Rate: 83   Rhythm: normal sinus rhythm  QRS Axis: normal  Intervals: normal  ST/T Wave abnormalities: normal  Conduction Disutrbances:none  Narrative Interpretation:   Old EKG Reviewed: none available    Labs Reviewed  CBC WITH DIFFERENTIAL - Abnormal; Notable for the following:    MCHC 36.7 (*)    Neutrophils Relative % 34 (*)    Lymphocytes Relative 58 (*)    All other components within normal limits  BASIC METABOLIC PANEL - Abnormal; Notable for the following:    Potassium 3.2 (*)    Glucose, Bld 106 (*)    GFR calc non Af Amer 65 (*)    GFR calc Af Amer 75 (*)    All other components within normal limits   No results found.   Diagnosis: Seizure    MDM  Patient presents to the ER for evaluation of seizure. She does have a history of seizures, takes Topamax for seizures. She indicates that she has not missed any doses. Patient was a very hot environment today, this may have precipitated the seizure. It was a very self-limited, 1 minute seizure. Patient administered Ativan here in the ER and observed. No further seizures noted. Basic lab work unremarkable. Patient was discharged, continue her current medications.        Gilda Crease, MD 06/04/13 2030

## 2013-06-04 NOTE — ED Notes (Signed)
Seizure at home lasted 1 min, witnessed by family, postical upon ems arrival,

## 2013-06-04 NOTE — ED Notes (Signed)
Pt states that she was at her son's house cleaning  All day, no airconditioning in apartment, pt reports getting really hot, + prodromal symptoms, could not talk, then family reports grandmal seizure activity for 1 min, son present

## 2013-07-05 DIAGNOSIS — I6529 Occlusion and stenosis of unspecified carotid artery: Secondary | ICD-10-CM | POA: Insufficient documentation

## 2013-09-09 ENCOUNTER — Emergency Department: Payer: Self-pay | Admitting: Emergency Medicine

## 2013-09-09 LAB — URINALYSIS, COMPLETE
Bilirubin,UR: NEGATIVE
Glucose,UR: NEGATIVE mg/dL (ref 0–75)
Granular Cast: 2
Ketone: NEGATIVE
Nitrite: NEGATIVE
Ph: 7 (ref 4.5–8.0)
Protein: NEGATIVE
RBC,UR: 11 /HPF (ref 0–5)
Specific Gravity: 1.012 (ref 1.003–1.030)
Squamous Epithelial: 1
WBC UR: 46 /HPF (ref 0–5)

## 2013-09-09 LAB — GC/CHLAMYDIA PROBE AMP

## 2013-09-09 LAB — WET PREP, GENITAL

## 2014-01-08 ENCOUNTER — Ambulatory Visit: Payer: Self-pay | Admitting: Family Medicine

## 2014-01-08 LAB — URINALYSIS, COMPLETE
Bilirubin,UR: NEGATIVE
Glucose,UR: NEGATIVE mg/dL (ref 0–75)
Ketone: NEGATIVE
Nitrite: NEGATIVE
Ph: 7 (ref 4.5–8.0)
Protein: NEGATIVE
Specific Gravity: 1.01 (ref 1.003–1.030)

## 2014-01-08 LAB — GC/CHLAMYDIA PROBE AMP

## 2014-01-08 LAB — WET PREP, GENITAL

## 2014-01-10 LAB — URINE CULTURE

## 2014-03-08 DIAGNOSIS — H538 Other visual disturbances: Secondary | ICD-10-CM | POA: Insufficient documentation

## 2014-05-16 ENCOUNTER — Emergency Department: Payer: Self-pay | Admitting: Emergency Medicine

## 2014-07-26 DIAGNOSIS — G5600 Carpal tunnel syndrome, unspecified upper limb: Secondary | ICD-10-CM | POA: Insufficient documentation

## 2014-07-26 DIAGNOSIS — M779 Enthesopathy, unspecified: Secondary | ICD-10-CM | POA: Insufficient documentation

## 2014-10-24 ENCOUNTER — Ambulatory Visit: Payer: Self-pay | Admitting: Physician Assistant

## 2014-10-24 LAB — URINALYSIS, COMPLETE
Bacteria: NEGATIVE
Bilirubin,UR: NEGATIVE
Glucose,UR: NEGATIVE
Ketone: NEGATIVE
Nitrite: NEGATIVE
Ph: 7 (ref 5.0–8.0)
Protein: NEGATIVE
Specific Gravity: 1.01 (ref 1.000–1.030)
Squamous Epithelial: NONE SEEN

## 2014-10-27 LAB — URINE CULTURE

## 2014-10-30 ENCOUNTER — Emergency Department: Payer: Self-pay | Admitting: Emergency Medicine

## 2014-10-30 LAB — URINALYSIS, COMPLETE
Bilirubin,UR: NEGATIVE
Glucose,UR: NEGATIVE mg/dL (ref 0–75)
Ketone: NEGATIVE
Leukocyte Esterase: NEGATIVE
Nitrite: NEGATIVE
Ph: 7 (ref 4.5–8.0)
Protein: NEGATIVE
RBC,UR: 5 /HPF (ref 0–5)
Specific Gravity: 1.014 (ref 1.003–1.030)
Squamous Epithelial: 2
WBC UR: 2 /HPF (ref 0–5)

## 2014-12-09 DIAGNOSIS — K589 Irritable bowel syndrome without diarrhea: Secondary | ICD-10-CM | POA: Insufficient documentation

## 2014-12-09 DIAGNOSIS — M7710 Lateral epicondylitis, unspecified elbow: Secondary | ICD-10-CM | POA: Insufficient documentation

## 2014-12-31 ENCOUNTER — Emergency Department: Payer: Self-pay | Admitting: Emergency Medicine

## 2014-12-31 DIAGNOSIS — M542 Cervicalgia: Secondary | ICD-10-CM | POA: Diagnosis not present

## 2014-12-31 DIAGNOSIS — R9431 Abnormal electrocardiogram [ECG] [EKG]: Secondary | ICD-10-CM | POA: Diagnosis not present

## 2014-12-31 DIAGNOSIS — S161XXA Strain of muscle, fascia and tendon at neck level, initial encounter: Secondary | ICD-10-CM | POA: Diagnosis not present

## 2014-12-31 DIAGNOSIS — Z79899 Other long term (current) drug therapy: Secondary | ICD-10-CM | POA: Diagnosis not present

## 2014-12-31 LAB — CBC
HCT: 41.5 % (ref 35.0–47.0)
HGB: 13.9 g/dL (ref 12.0–16.0)
MCH: 31.9 pg (ref 26.0–34.0)
MCHC: 33.5 g/dL (ref 32.0–36.0)
MCV: 95 fL (ref 80–100)
Platelet: 325 10*3/uL (ref 150–440)
RBC: 4.37 10*6/uL (ref 3.80–5.20)
RDW: 13.1 % (ref 11.5–14.5)
WBC: 6 10*3/uL (ref 3.6–11.0)

## 2014-12-31 LAB — BASIC METABOLIC PANEL
Anion Gap: 10 (ref 7–16)
BUN: 17 mg/dL (ref 7–18)
Calcium, Total: 9.4 mg/dL (ref 8.5–10.1)
Chloride: 113 mmol/L — ABNORMAL HIGH (ref 98–107)
Co2: 21 mmol/L (ref 21–32)
Creatinine: 0.9 mg/dL (ref 0.60–1.30)
EGFR (African American): >60
EGFR (Non-African Amer.): >60
Glucose: 107 mg/dL — ABNORMAL HIGH (ref 65–99)
Osmolality: 289 (ref 275–301)
Potassium: 3.6 mmol/L (ref 3.5–5.1)
Sodium: 144 mmol/L (ref 136–145)

## 2015-01-06 DIAGNOSIS — M542 Cervicalgia: Secondary | ICD-10-CM | POA: Diagnosis not present

## 2015-02-10 DIAGNOSIS — M67971 Unspecified disorder of synovium and tendon, right ankle and foot: Secondary | ICD-10-CM | POA: Diagnosis not present

## 2015-02-10 DIAGNOSIS — M1990 Unspecified osteoarthritis, unspecified site: Secondary | ICD-10-CM | POA: Diagnosis not present

## 2015-02-10 DIAGNOSIS — M7751 Other enthesopathy of right foot: Secondary | ICD-10-CM | POA: Diagnosis not present

## 2015-02-21 ENCOUNTER — Emergency Department: Payer: Self-pay | Admitting: Emergency Medicine

## 2015-02-21 DIAGNOSIS — R1084 Generalized abdominal pain: Secondary | ICD-10-CM | POA: Diagnosis not present

## 2015-02-21 DIAGNOSIS — R1110 Vomiting, unspecified: Secondary | ICD-10-CM | POA: Diagnosis not present

## 2015-02-21 DIAGNOSIS — R112 Nausea with vomiting, unspecified: Secondary | ICD-10-CM | POA: Diagnosis not present

## 2015-02-21 DIAGNOSIS — R197 Diarrhea, unspecified: Secondary | ICD-10-CM | POA: Diagnosis not present

## 2015-03-01 DIAGNOSIS — M79671 Pain in right foot: Secondary | ICD-10-CM | POA: Diagnosis not present

## 2015-03-01 DIAGNOSIS — M2141 Flat foot [pes planus] (acquired), right foot: Secondary | ICD-10-CM | POA: Diagnosis not present

## 2015-03-01 DIAGNOSIS — M76821 Posterior tibial tendinitis, right leg: Secondary | ICD-10-CM | POA: Diagnosis not present

## 2015-03-10 ENCOUNTER — Ambulatory Visit: Payer: Self-pay | Admitting: Podiatry

## 2015-03-10 DIAGNOSIS — M25871 Other specified joint disorders, right ankle and foot: Secondary | ICD-10-CM | POA: Diagnosis not present

## 2015-03-10 DIAGNOSIS — M19071 Primary osteoarthritis, right ankle and foot: Secondary | ICD-10-CM | POA: Diagnosis not present

## 2015-03-10 DIAGNOSIS — M25471 Effusion, right ankle: Secondary | ICD-10-CM | POA: Diagnosis not present

## 2015-03-10 DIAGNOSIS — R600 Localized edema: Secondary | ICD-10-CM | POA: Diagnosis not present

## 2015-03-13 DIAGNOSIS — M25571 Pain in right ankle and joints of right foot: Secondary | ICD-10-CM | POA: Diagnosis not present

## 2015-03-13 DIAGNOSIS — G8929 Other chronic pain: Secondary | ICD-10-CM | POA: Diagnosis not present

## 2015-03-20 DIAGNOSIS — M25571 Pain in right ankle and joints of right foot: Secondary | ICD-10-CM | POA: Diagnosis not present

## 2015-03-20 DIAGNOSIS — G8929 Other chronic pain: Secondary | ICD-10-CM | POA: Diagnosis not present

## 2015-03-27 DIAGNOSIS — G8929 Other chronic pain: Secondary | ICD-10-CM | POA: Diagnosis not present

## 2015-03-27 DIAGNOSIS — M25571 Pain in right ankle and joints of right foot: Secondary | ICD-10-CM | POA: Diagnosis not present

## 2015-03-29 DIAGNOSIS — M25571 Pain in right ankle and joints of right foot: Secondary | ICD-10-CM | POA: Diagnosis not present

## 2015-03-29 DIAGNOSIS — G8929 Other chronic pain: Secondary | ICD-10-CM | POA: Diagnosis not present

## 2015-04-03 DIAGNOSIS — M2141 Flat foot [pes planus] (acquired), right foot: Secondary | ICD-10-CM | POA: Diagnosis not present

## 2015-04-03 DIAGNOSIS — M76821 Posterior tibial tendinitis, right leg: Secondary | ICD-10-CM | POA: Diagnosis not present

## 2015-04-11 DIAGNOSIS — M76821 Posterior tibial tendinitis, right leg: Secondary | ICD-10-CM | POA: Diagnosis not present

## 2015-05-17 DIAGNOSIS — K644 Residual hemorrhoidal skin tags: Secondary | ICD-10-CM | POA: Diagnosis not present

## 2015-05-17 DIAGNOSIS — K5900 Constipation, unspecified: Secondary | ICD-10-CM | POA: Diagnosis not present

## 2015-05-17 DIAGNOSIS — Z86010 Personal history of colonic polyps: Secondary | ICD-10-CM | POA: Diagnosis not present

## 2015-05-26 ENCOUNTER — Emergency Department: Payer: Medicare Other

## 2015-05-26 ENCOUNTER — Encounter: Payer: Self-pay | Admitting: Emergency Medicine

## 2015-05-26 ENCOUNTER — Emergency Department
Admission: EM | Admit: 2015-05-26 | Discharge: 2015-05-26 | Disposition: A | Discharge status: Home / Self Care | Payer: Medicare Other | Attending: Student | Admitting: Student

## 2015-05-26 DIAGNOSIS — Y9389 Activity, other specified: Secondary | ICD-10-CM | POA: Insufficient documentation

## 2015-05-26 DIAGNOSIS — Z79899 Other long term (current) drug therapy: Secondary | ICD-10-CM | POA: Insufficient documentation

## 2015-05-26 DIAGNOSIS — S6991XA Unspecified injury of right wrist, hand and finger(s), initial encounter: Secondary | ICD-10-CM | POA: Diagnosis not present

## 2015-05-26 DIAGNOSIS — W231XXA Caught, crushed, jammed, or pinched between stationary objects, initial encounter: Secondary | ICD-10-CM | POA: Insufficient documentation

## 2015-05-26 DIAGNOSIS — Y998 Other external cause status: Secondary | ICD-10-CM | POA: Insufficient documentation

## 2015-05-26 DIAGNOSIS — Y9289 Other specified places as the place of occurrence of the external cause: Secondary | ICD-10-CM | POA: Diagnosis not present

## 2015-05-26 DIAGNOSIS — S6000XA Contusion of unspecified finger without damage to nail, initial encounter: Secondary | ICD-10-CM

## 2015-05-26 DIAGNOSIS — S60051A Contusion of right little finger without damage to nail, initial encounter: Secondary | ICD-10-CM | POA: Diagnosis not present

## 2015-05-26 DIAGNOSIS — M7989 Other specified soft tissue disorders: Secondary | ICD-10-CM | POA: Diagnosis not present

## 2015-05-26 DIAGNOSIS — M79641 Pain in right hand: Secondary | ICD-10-CM | POA: Diagnosis not present

## 2015-05-26 MED ORDER — IBUPROFEN 600 MG PO TABS
600.0000 mg | ORAL_TABLET | Freq: Once | ORAL | Status: AC
  Administered 2015-05-26: 600 mg via ORAL

## 2015-05-26 MED ORDER — IBUPROFEN 600 MG PO TABS
ORAL_TABLET | ORAL | Status: AC
  Administered 2015-05-26: 600 mg via ORAL
  Filled 2015-05-26: qty 1

## 2015-05-26 MED ORDER — TRAMADOL HCL 50 MG PO TABS: 50.0000 mg | ORAL_TABLET | Freq: Four times a day (QID) | ORAL | Status: DC | PRN

## 2015-05-26 NOTE — Discharge Instructions (Signed)
Contusion °A contusion is a deep bruise. Contusions happen when an injury causes bleeding under the skin. Signs of bruising include pain, puffiness (swelling), and discolored skin. The contusion may turn blue, purple, or yellow. °HOME CARE  °· Put ice on the injured area. °¨ Put ice in a plastic bag. °¨ Place a towel between your skin and the bag. °¨ Leave the ice on for 15-20 minutes, 03-04 times a day. °· Only take medicine as told by your doctor. °· Rest the injured area. °· If possible, raise (elevate) the injured area to lessen puffiness. °GET HELP RIGHT AWAY IF:  °· You have more bruising or puffiness. °· You have pain that is getting worse. °· Your puffiness or pain is not helped by medicine. °MAKE SURE YOU:  °· Understand these instructions. °· Will watch your condition. °· Will get help right away if you are not doing well or get worse. °Document Released: 06/03/2008 Document Revised: 03/09/2012 Document Reviewed: 10/21/2011 °ExitCare® Patient Information ©2015 [employer]. This information is not intended to replace advice given to you by your health care provider. Make sure you discuss any questions you have with your health care provider. ° °

## 2015-05-26 NOTE — ED Notes (Signed)
Pt reports shutting right pinky finger in car door about 2 hours ago. Pt reports pain from pinky that throbs down to right wrist. Pt able to move finger at joint connected to hand.

## 2015-05-26 NOTE — ED Provider Notes (Signed)
[facility] Emergency Department Provider Note  ____________________________________________  Time seen:1500 I have reviewed the triage vital signs and the nursing notes.   HISTORY  Chief Complaint Hand Pain    HPI Elizabeth Mata is a 54 y.o. female caught her right fifth finger in the car door approximately 2 hours ago. She states it throbs but she is able to use it.She has not taken any medication for this. She describes her pain as 8 out of 10.   Past Medical History  Diagnosis Date  . Syncope     Recurrent over the last 2 years. had workup at [facility] this year with cardiology and neurology. Had echo and 3-week event monitor that were unremarkable. Has had negative EEG. Echo in 11/11 at [facility] showed no significant abnormalities. Possibly vasovagal  . Seizures     Patient Active Problem List   Diagnosis Date Noted  . DIZZINESS 01/09/2011  . CHEST PAIN UNSPECIFIED 12/07/2010  . SYNCOPE AND COLLAPSE 11/07/2010    Past Surgical History  Procedure Laterality Date  . Partial hysterectomy    . Foot surgery    . Cholecystectomy      Current Outpatient Rx  Name  Route  Sig  Dispense  Refill  . fexofenadine (ALLEGRA) 30 MG tablet   Oral   Take 30 mg by mouth 2 (two) times daily.         . fludrocortisone (FLORINEF) 0.1 MG tablet   Oral   Take 0.1 mg by mouth 2 (two) times daily.           Marland Kitchen ibuprofen (ADVIL,MOTRIN) 200 MG tablet   Oral   Take 400 mg by mouth 3 (three) times daily.           . metoprolol tartrate (LOPRESSOR) 25 MG tablet   Oral   Take 12.5 mg by mouth 2 (two) times daily.           Marland Kitchen topiramate (TOPAMAX) 100 MG tablet   Oral   Take 150 mg by mouth 2 (two) times daily.         . traMADol (ULTRAM) 50 MG tablet   Oral   Take 1 tablet (50 mg total) by mouth every 6 (six) hours as needed.   20 tablet   0     Allergies Hydrocodone-acetaminophen; Metronidazole; and Sulfa antibiotics  Family History   Problem Relation Age of Onset  . Coronary artery disease Neg Hx     Social History History  Substance Use Topics  . Smoking status: Never Smoker   . Smokeless tobacco: Not on file  . Alcohol Use: No    Review of Systems Constitutional: No fever/chills Eyes: No visual changes. Cardiovascular: Denies chest pain. Respiratory: Denies shortness of breath. Gastrointestinal: No abdominal pain.  No nausea, no vomiting.  Musculoskeletal: Negative for back pain. Skin: Negative for rash. Neurological: Negative for headaches 10-point ROS otherwise negative.  ____________________________________________   PHYSICAL EXAM:  VITAL SIGNS: ED Triage Vitals  Enc Vitals Group     BP 05/26/15 1213 117/84 mmHg     Pulse Rate 05/26/15 1213 68     Resp 05/26/15 1213 19     Temp 05/26/15 1213 98.4 F (36.9 C)     Temp Source 05/26/15 1213 Oral     SpO2 05/26/15 1213 100 %     Weight 05/26/15 1213 134 lb (60.782 kg)     Height 05/26/15 1213 4\' 11"  (1.499 m)     Head Cir --  Peak Flow --      Pain Score 05/26/15 1215 8     Pain Loc --      Pain Edu? --      Excl. in [place]? --     Constitutional: Alert and oriented. Well appearing and in no acute distress. Eyes: Conjunctivae are normal. PERRL. EOMI. Head: Atraumatic. Nose: No congestion/rhinnorhea. Cardiovascular: Normal rate, regular rhythm. Grossly normal heart sounds.  Good peripheral circulation. Respiratory: Normal respiratory effort.  No retractions. Lungs CTAB. Musculoskeletal: No lower extremity tenderness nor edema.  No joint effusions. Neurologic:  Normal speech and language. No gross focal neurologic deficits are appreciated. Speech is normal. No gait instability. Skin:  Skin is warm, dry and intact. No rash noted. Psychiatric: Mood and affect are normal. Speech and behavior are normal.  ____________________________________________   LABS (all labs ordered are listed, but only abnormal results are displayed)  Labs  Reviewed - No data to display RADIOLOGY  X-rays were negative per radiologist. ____________________________________________   PROCEDURES  Procedure(s) performed: None  Critical Care performed: No  ____________________________________________   INITIAL IMPRESSION / ASSESSMENT AND PLAN / ED COURSE  Pertinent labs & imaging results that were available during my care of the patient were reviewed by me and considered in my medical decision making (see chart for details).  Patient was placed in a splint, told to ice and elevate. She is also given a prescription for ibuprofen. He is to follow-up with Dr. Sabra Heck if any continued problems. ____________________________________________   FINAL CLINICAL IMPRESSION(S) / ED DIAGNOSES  Final diagnoses:  Contusion, finger, initial encounter      Johnn Hai, PA-C 05/26/15 1727  Joanne Gavel, MD 05/27/15 [phone]

## 2015-07-19 ENCOUNTER — Other Ambulatory Visit: Payer: Self-pay | Admitting: Family Medicine

## 2015-07-19 ENCOUNTER — Telehealth: Payer: Self-pay | Admitting: Family Medicine

## 2015-07-19 DIAGNOSIS — Z1239 Encounter for other screening for malignant neoplasm of breast: Secondary | ICD-10-CM

## 2015-07-19 NOTE — Telephone Encounter (Signed)
Please let patient know that mammogram has been ordered, she can call [place] and schedule at her convenience. She is new to practice, seen once 04/2015, will need to schedule for Medicare Wellness exam before end of the year.

## 2015-07-19 NOTE — Telephone Encounter (Signed)
Patient informed and wellness exam made

## 2015-07-19 NOTE — Telephone Encounter (Signed)
Pt is requesting an referral so she can do her mammogram at [facility]

## 2015-07-24 ENCOUNTER — Ambulatory Visit: Payer: Medicare Other

## 2015-08-09 ENCOUNTER — Other Ambulatory Visit: Payer: Self-pay | Admitting: Family Medicine

## 2015-08-09 ENCOUNTER — Ambulatory Visit
Admission: RE | Admit: 2015-08-09 | Discharge: 2015-08-09 | Disposition: A | Discharge status: Home / Self Care | Payer: Medicare Other | Source: Ambulatory Visit | Attending: Family Medicine | Admitting: Family Medicine

## 2015-08-09 DIAGNOSIS — Z1239 Encounter for other screening for malignant neoplasm of breast: Secondary | ICD-10-CM

## 2015-08-09 DIAGNOSIS — Z1231 Encounter for screening mammogram for malignant neoplasm of breast: Secondary | ICD-10-CM | POA: Insufficient documentation

## 2015-08-16 ENCOUNTER — Ambulatory Visit (INDEPENDENT_AMBULATORY_CARE_PROVIDER_SITE_OTHER): Payer: Medicaid Other | Admitting: Family Medicine

## 2015-08-16 ENCOUNTER — Encounter: Payer: Self-pay | Admitting: Family Medicine

## 2015-08-16 VITALS — BP 122/84 | HR 81 | Temp 98.3°F | Resp 16 | Ht 59.0 in | Wt 130.4 lb

## 2015-08-16 DIAGNOSIS — N200 Calculus of kidney: Secondary | ICD-10-CM | POA: Insufficient documentation

## 2015-08-16 DIAGNOSIS — G43109 Migraine with aura, not intractable, without status migrainosus: Secondary | ICD-10-CM

## 2015-08-16 DIAGNOSIS — R319 Hematuria, unspecified: Secondary | ICD-10-CM | POA: Diagnosis not present

## 2015-08-16 DIAGNOSIS — R569 Unspecified convulsions: Secondary | ICD-10-CM | POA: Insufficient documentation

## 2015-08-16 DIAGNOSIS — N390 Urinary tract infection, site not specified: Secondary | ICD-10-CM | POA: Insufficient documentation

## 2015-08-16 DIAGNOSIS — I6529 Occlusion and stenosis of unspecified carotid artery: Secondary | ICD-10-CM

## 2015-08-16 LAB — POCT URINALYSIS DIPSTICK
Bilirubin, UA: NEGATIVE
Blood, UA: POSITIVE
Glucose, UA: NEGATIVE
Leukocytes, UA: NEGATIVE
Nitrite, UA: NEGATIVE
Protein, UA: NEGATIVE
Spec Grav, UA: 1.01
Urobilinogen, UA: NEGATIVE
pH, UA: 5.5

## 2015-08-16 MED ORDER — FLUCONAZOLE 150 MG PO TABS: 150.0000 mg | ORAL_TABLET | Freq: Once | ORAL | Status: DC

## 2015-08-16 MED ORDER — CIPROFLOXACIN HCL 500 MG PO TABS: 500.0000 mg | ORAL_TABLET | Freq: Two times a day (BID) | ORAL | Status: DC

## 2015-08-17 NOTE — Progress Notes (Signed)
Name: Elizabeth Mata   MRN: [id number]    DOB: Oct 09, 1961   Date:08/17/2015       Progress Note  Subjective  Chief Complaint  Chief Complaint  Patient presents with  . Urinary Tract Infection    pressure,burning and urgency for last 2 days    Urinary Tract Infection  This is a new problem. The current episode started in the past 7 days. The problem occurs every urination. The problem has been gradually worsening. The quality of the pain is described as burning and stabbing. The pain is moderate. There has been no fever. There is no history of pyelonephritis. Associated symptoms include chills, flank pain, frequency, hesitancy and urgency. Pertinent negatives include no hematuria. She has tried nothing for the symptoms. Her past medical history is significant for recurrent UTIs.      Past Medical History  Diagnosis Date  . Syncope     Recurrent over the last 2 years. had workup at [facility] this year with cardiology and neurology. Had echo and 3-week event monitor that were unremarkable. Has had negative EEG. Echo in 11/11 at [facility] showed no significant abnormalities. Possibly vasovagal  . Seizures     Social History  Substance Use Topics  . Smoking status: Never Smoker   . Smokeless tobacco: Not on file  . Alcohol Use: No     Current outpatient prescriptions:  .  fexofenadine (ALLEGRA) 30 MG tablet, Take 30 mg by mouth 2 (two) times daily., Disp: , Rfl:  .  ibuprofen (ADVIL,MOTRIN) 200 MG tablet, Take 400 mg by mouth 3 (three) times daily.  , Disp: , Rfl:  .  topiramate (TOPAMAX) 100 MG tablet, Take 150 mg by mouth 2 (two) times daily., Disp: , Rfl:  .  ciprofloxacin (CIPRO) 500 MG tablet, Take 1 tablet (500 mg total) by mouth 2 (two) times daily., Disp: 10 tablet, Rfl: 0 .  fluconazole (DIFLUCAN) 150 MG tablet, Take 1 tablet (150 mg total) by mouth once., Disp: 1 tablet, Rfl: 0  Allergies  Allergen Reactions  . Hydrocodone-Acetaminophen   . Metronidazole   . Sulfa  Antibiotics     Review of Systems  Constitutional: Positive for chills.  Genitourinary: Positive for hesitancy, urgency, frequency and flank pain. Negative for hematuria.     Objective  Filed Vitals:   08/16/15 1058  BP: 122/84  Pulse: 81  Temp: 98.3 F (36.8 C)  Resp: 16  Height: 4\' 11"  (1.499 m)  Weight: 130 lb 6 oz (59.138 kg)  SpO2: 97%     Physical Exam  Constitutional: She is well-developed, well-nourished, and in no distress.  HENT:  Head: Normocephalic.  Neck: Normal range of motion. Neck supple.  Cardiovascular: Normal rate, regular rhythm and normal heart sounds.   Abdominal: Soft. She exhibits no distension. There is tenderness (SUPRAPUBIC DISCOMFORT).      Assessment & Plan  1. Urinary tract infection with hematuria, site unspecified  - POCT Urinalysis Dipstick - ciprofloxacin (CIPRO) 500 MG tablet; Take 1 tablet (500 mg total) by mouth 2 (two) times daily.  Dispense: 10 tablet; Refill: 0 - fluconazole (DIFLUCAN) 150 MG tablet; Take 1 tablet (150 mg total) by mouth once.  Dispense: 1 tablet; Refill: 0 - CULTURE, URINE COMPREHENSIVE  2. Carotid artery narrowing, unspecified laterality   3. Basilar artery migraine

## 2015-08-18 LAB — CULTURE, URINE COMPREHENSIVE

## 2015-09-13 ENCOUNTER — Ambulatory Visit (INDEPENDENT_AMBULATORY_CARE_PROVIDER_SITE_OTHER): Payer: Medicare Other | Admitting: Family Medicine

## 2015-09-13 ENCOUNTER — Encounter: Payer: Self-pay | Admitting: Family Medicine

## 2015-09-13 VITALS — BP 110/70 | HR 67 | Temp 98.1°F | Resp 14 | Ht 59.0 in | Wt 133.8 lb

## 2015-09-13 DIAGNOSIS — L309 Dermatitis, unspecified: Secondary | ICD-10-CM | POA: Diagnosis not present

## 2015-09-13 DIAGNOSIS — Z0000 Encounter for general adult medical examination without abnormal findings: Secondary | ICD-10-CM | POA: Diagnosis not present

## 2015-09-13 DIAGNOSIS — K581 Irritable bowel syndrome with constipation: Secondary | ICD-10-CM | POA: Insufficient documentation

## 2015-09-13 DIAGNOSIS — K5904 Chronic idiopathic constipation: Secondary | ICD-10-CM

## 2015-09-13 DIAGNOSIS — Z1231 Encounter for screening mammogram for malignant neoplasm of breast: Secondary | ICD-10-CM | POA: Insufficient documentation

## 2015-09-13 DIAGNOSIS — K5900 Constipation, unspecified: Secondary | ICD-10-CM | POA: Diagnosis not present

## 2015-09-13 DIAGNOSIS — Z23 Encounter for immunization: Secondary | ICD-10-CM | POA: Diagnosis not present

## 2015-09-13 MED ORDER — LINACLOTIDE 145 MCG PO CAPS
145.0000 ug | ORAL_CAPSULE | Freq: Every day | ORAL | Status: DC
Start: 2015-09-13 — End: 2016-06-28

## 2015-09-13 MED ORDER — HYDROCORTISONE VALERATE 0.2 % EX CREA: 1.0000 "application " | TOPICAL_CREAM | Freq: Two times a day (BID) | CUTANEOUS | Status: DC

## 2015-09-13 NOTE — Progress Notes (Signed)
Name: Elizabeth Mata   MRN: [id number]    DOB: 1961/05/27   Date:09/13/2015       Progress Note  Subjective  Chief Complaint  Chief Complaint  Patient presents with  . Annual Exam    patient had a mammogram and a hysterectomy  . Skin Discoloration    patient states that her skin will it itch and then it turns dark.    HPI  Patient is here today for a Complete Female Physical Exam:  The patient has has no unusual complaints and complains of skin changes and constipation. Overall feels healthy otherwise. Diet is well balanced. In general does exercise regularly. Sees dentist regularly and addresses vision concerns with ophthalmologist if applicable. Currently is not concerned about exposure to any STDs. Menstrual history is significant for hysterectomy including cervix removal.   Constipation: Patient complains of constipation.  Stool pattern has been 3 firm, formed and pellet like stool(s) per week. Onset was several years ago Defecation has been incomplete. Co-Morbid conditions:irritable bowel syndrome and neurologic disease. Symptoms have been stable. Eating well balanced meal with fiber has tried regular exercise and hydration with water for relief from constipation with minimal effects. OTC medications tried include metamucil, miralax, laxatives, prune juice, colace. These had inconsistent or minimal benefits.   Rash: Patient complains of rash involving the around mouth, at nose, ear lobes, anterior chest wall, feet. Rash started several months ago. Appearance of rash at onset: Texture of lesion(s): raised. Rash has not changed over time Initial distribution: same as previously mentioned.  Discomfort associated with rash: is pruritic.  Associated symptoms: none. Denies: abdominal pain, arthralgia, congestion, cough, decrease in appetite, decrease in energy level, fever, headache, irritability, myalgia, nausea, sore throat and vomiting. Patient has not had previous evaluation of rash.  Patient has not had previous treatment.  Taking allegra every day and notices itching is worse when she does not take her Allegra. Patient has not had contacts with similar rash. Patient has not identified precipitant. Patient has not had new exposures (soaps, lotions, laundry detergents, foods, medications, plants, insects or animals.)    Past Medical History  Diagnosis Date  . Syncope     Recurrent over the last 2 years. had workup at [facility] this year with cardiology and neurology. Had echo and 3-week event monitor that were unremarkable. Has had negative EEG. Echo in 11/11 at [facility] showed no significant abnormalities. Possibly vasovagal  . Seizures     Past Surgical History  Procedure Laterality Date  . Partial hysterectomy    . Foot surgery    . Cholecystectomy    . Breast biopsy Left 1980'S    EXCISIONAL - NEG    Family History  Problem Relation Age of Onset  . Coronary artery disease Neg Hx   . Breast cancer Neg Hx     Social History   Social History  . Marital Status: Legally Separated    Spouse Name: N/A  . Number of Children: N/A  . Years of Education: N/A   Occupational History  . Patent examiner    Social History Main Topics  . Smoking status: Never Smoker   . Smokeless tobacco: Not on file  . Alcohol Use: No  . Drug Use: No  . Sexual Activity: Not on file   Other Topics Concern  . Not on file   Social History Narrative   Regular exercise     Current outpatient prescriptions:  .  fexofenadine (ALLEGRA) 30 MG tablet, Take 30 mg  by mouth 2 (two) times daily., Disp: , Rfl:  .  ibuprofen (ADVIL,MOTRIN) 200 MG tablet, Take 400 mg by mouth 3 (three) times daily.  , Disp: , Rfl:  .  topiramate (TOPAMAX) 100 MG tablet, Take 150 mg by mouth 2 (two) times daily., Disp: , Rfl:   Allergies  Allergen Reactions  . Hydrocodone-Acetaminophen   . Metronidazole   . Sulfa Antibiotics     ROS  CONSTITUTIONAL: No significant weight changes, fever, chills,  weakness or fatigue.  HEENT:  - Eyes: No visual changes.  - Ears: No auditory changes. No pain.  - Nose: No sneezing, congestion, runny nose. - Throat: No sore throat. No changes in swallowing. SKIN: Yes skin changes. CARDIOVASCULAR: No chest pain, chest pressure or chest discomfort. No palpitations or edema.  RESPIRATORY: No shortness of breath, cough or sputum.  GASTROINTESTINAL: No anorexia, nausea, vomiting. No abdominal pain or blood.  GENITOURINARY: No dysuria. No frequency. No discharge.  NEUROLOGICAL: No headache, dizziness, syncope, paralysis, ataxia, numbness or tingling in the extremities. No memory changes. No change in bowel or bladder control.  MUSCULOSKELETAL: No joint pain. No muscle pain. HEMATOLOGIC: No anemia, bleeding or bruising.  LYMPHATICS: No enlarged lymph nodes.  PSYCHIATRIC: No change in mood. No change in sleep pattern.  ENDOCRINOLOGIC: No reports of sweating, cold or heat intolerance. No polyuria or polydipsia.   Objective  Filed Vitals:   09/13/15 1135  BP: 110/70  Pulse: 67  Temp: 98.1 F (36.7 C)  TempSrc: Oral  Resp: 14  Height: 4\' 11"  (1.499 m)  Weight: 133 lb 12.8 oz (60.691 kg)  SpO2: 95%   Body mass index is 27.01 kg/(m^2).  No exam data present  Recent Results (from the past 2160 hour(s))  CULTURE, URINE COMPREHENSIVE     Status: None   Collection Time: 08/16/15 12:00 AM  Result Value Ref Range   Urine Culture, Comprehensive Final report    Result 1 Comment     Comment: No growth in 36 - 48 hours.  POCT Urinalysis Dipstick     Status: None   Collection Time: 08/16/15 11:03 AM  Result Value Ref Range   Color, UA yellow    Clarity, UA clear    Glucose, UA neg    Bilirubin, UA neg    Ketones, UA trace    Spec Grav, UA 1.010    Blood, UA pos    pH, UA 5.5    Protein, UA neg    Urobilinogen, UA negative    Nitrite, UA neg    Leukocytes, UA Negative Negative    Physical Exam  Constitutional: Patient appears well-developed  and well-nourished. In no distress.  HEENT:  - Head: Normocephalic and atraumatic.  - Ears: Bilateral TMs gray, no erythema or effusion - Nose: Nasal mucosa moist - Mouth/Throat: Oropharynx is clear and moist. No tonsillar hypertrophy or erythema. No post nasal drainage.  - Eyes: Conjunctivae clear, EOM movements normal. PERRLA. No scleral icterus.  Neck: Normal range of motion. Neck supple. No JVD present. No thyromegaly present.  Cardiovascular: Normal rate, regular rhythm and normal heart sounds.  No murmur heard.  Pulmonary/Chest: Effort normal and breath sounds normal. No respiratory distress. Abdominal: Soft. Bowel sounds are normal, no distension. There is no tenderness. no masses Musculoskeletal: Normal range of motion bilateral UE and LE, no joint effusions. Peripheral vascular: Bilateral LE no edema. Neurological: CN II-XII grossly intact with no focal deficits. Alert and oriented to person, place, and time. Coordination, balance, strength, speech  and gait are normal.  Skin: Skin is warm and dry. Scattered areas of very fine dry, papular lesions around chin, base of neck, base of wrists, rim of her feet.    Psychiatric: Patient has a normal mood and affect. Behavior is normal in office today. Judgment and thought content normal in office today.   Assessment & Plan  1. Annual physical exam She has opted to defer breast and pelvic exam today. Mammogram done 07/2015.  2. Need for immunization against influenza  - Flu Vaccine QUAD 36+ mos PF IM (Fluarix & Fluzone Quad PF)  3. Eczema Handout given.   - hydrocortisone valerate cream (WESTCORT) 0.2 %; Apply 1 application topically 2 (two) times daily.  Dispense: 45 g; Refill: 1  4. Chronic idiopathic constipation Discussed symptoms and treatment options. Decided on trial of Linzess if affordable. The patient has been counseled on the proper use, side effects and potential interactions of the new medication. Patient encouraged to  review the side effects and safety profile pamphlet provided with the prescription from the pharmacy as well as request counseling from the pharmacy team as needed.   - Linaclotide (LINZESS) 145 MCG CAPS capsule; Take 1 capsule (145 mcg total) by mouth daily.  Dispense: 90 capsule; Refill: 1

## 2015-09-13 NOTE — Patient Instructions (Signed)
Eczema Eczema, also called atopic dermatitis, is a skin disorder that causes inflammation of the skin. It causes a red rash and dry, scaly skin. The skin becomes very itchy. Eczema is generally worse during the cooler winter months and often improves with the warmth of summer. Eczema usually starts showing signs in infancy. Some children outgrow eczema, but it may last through adulthood.  CAUSES  The exact cause of eczema is not known, but it appears to run in families. People with eczema often have a family history of eczema, allergies, asthma, or hay fever. Eczema is not contagious. Flare-ups of the condition may be caused by:   Contact with something you are sensitive or allergic to.   Stress. SIGNS AND SYMPTOMS  Dry, scaly skin.   Red, itchy rash.   Itchiness. This may occur before the skin rash and may be very intense.  DIAGNOSIS  The diagnosis of eczema is usually made based on symptoms and medical history. TREATMENT  Eczema cannot be cured, but symptoms usually can be controlled with treatment and other strategies. A treatment plan might include:  Controlling the itching and scratching.   Use over-the-counter antihistamines as directed for itching. This is especially useful at night when the itching tends to be worse.   Use over-the-counter steroid creams as directed for itching.   Avoid scratching. Scratching makes the rash and itching worse. It may also result in a skin infection (impetigo) due to a break in the skin caused by scratching.   Keeping the skin well moisturized with creams every day. This will seal in moisture and help prevent dryness. Lotions that contain alcohol and water should be avoided because they can dry the skin.   Limiting exposure to things that you are sensitive or allergic to (allergens).   Recognizing situations that cause stress.   Developing a plan to manage stress.  HOME CARE INSTRUCTIONS   Only take over-the-counter or  prescription medicines as directed by your health care provider.   Do not use anything on the skin without checking with your health care provider.   Keep baths or showers short (5 minutes) in warm (not hot) water. Use mild cleansers for bathing. These should be unscented. You may add nonperfumed bath oil to the bath water. It is best to avoid soap and bubble bath.   Immediately after a bath or shower, when the skin is still damp, apply a moisturizing ointment to the entire body. This ointment should be a petroleum ointment. This will seal in moisture and help prevent dryness. The thicker the ointment, the better. These should be unscented.   Keep fingernails cut short. Children with eczema may need to wear soft gloves or mittens at night after applying an ointment.   Dress in clothes made of cotton or cotton blends. Dress lightly, because heat increases itching.   A child with eczema should stay away from anyone with fever blisters or cold sores. The virus that causes fever blisters (herpes simplex) can cause a serious skin infection in children with eczema. SEEK MEDICAL CARE IF:   Your itching interferes with sleep.   Your rash gets worse or is not better within 1 week after starting treatment.   You see pus or soft yellow scabs in the rash area.   You have a fever.   You have a rash flare-up after contact with someone who has fever blisters.  Document Released: 12/13/2000 Document Revised: 10/06/2013 Document Reviewed: 07/19/2013 Exit[employer] Patient Information 2015 [employer]. This information   is not intended to replace advice given to you by your health care provider. Make sure you discuss any questions you have with your health care provider.  

## 2015-09-19 ENCOUNTER — Other Ambulatory Visit: Payer: Self-pay

## 2015-09-19 MED ORDER — HYDROCORTISONE 2.5 % EX CREA: TOPICAL_CREAM | Freq: Two times a day (BID) | CUTANEOUS | Status: DC

## 2015-09-19 NOTE — Telephone Encounter (Signed)
Got a fax from [facility] stating the original rx [facility]) was not covered and a substitute is needed.  Refill request was sent to Dr. Bobetta Lime for approval and submission.

## 2015-10-16 ENCOUNTER — Ambulatory Visit: Payer: Medicaid Other | Admitting: Family Medicine

## 2015-10-24 ENCOUNTER — Ambulatory Visit (INDEPENDENT_AMBULATORY_CARE_PROVIDER_SITE_OTHER): Payer: Medicare Other | Admitting: Family Medicine

## 2015-10-24 ENCOUNTER — Ambulatory Visit
Admission: RE | Admit: 2015-10-24 | Discharge: 2015-10-24 | Disposition: A | Discharge status: Home / Self Care | Payer: Medicare Other | Source: Ambulatory Visit | Attending: Family Medicine | Admitting: Family Medicine

## 2015-10-24 ENCOUNTER — Encounter: Payer: Self-pay | Admitting: Family Medicine

## 2015-10-24 ENCOUNTER — Other Ambulatory Visit: Payer: Self-pay | Admitting: Family Medicine

## 2015-10-24 VITALS — BP 122/86 | HR 83 | Temp 98.2°F | Resp 16 | Ht 59.0 in | Wt 133.4 lb

## 2015-10-24 DIAGNOSIS — N200 Calculus of kidney: Secondary | ICD-10-CM

## 2015-10-24 DIAGNOSIS — R319 Hematuria, unspecified: Secondary | ICD-10-CM | POA: Diagnosis not present

## 2015-10-24 DIAGNOSIS — R109 Unspecified abdominal pain: Secondary | ICD-10-CM

## 2015-10-24 DIAGNOSIS — I6529 Occlusion and stenosis of unspecified carotid artery: Secondary | ICD-10-CM | POA: Diagnosis not present

## 2015-10-24 DIAGNOSIS — R1012 Left upper quadrant pain: Secondary | ICD-10-CM

## 2015-10-24 HISTORY — DX: Calculus of kidney: N20.0

## 2015-10-24 LAB — POCT URINALYSIS DIPSTICK
Bilirubin, UA: NEGATIVE
Glucose, UA: NEGATIVE
Ketones, UA: NEGATIVE
Nitrite, UA: NEGATIVE
Protein, UA: NEGATIVE
Spec Grav, UA: <=1.005
Urobilinogen, UA: 0.2
pH, UA: 6

## 2015-10-24 MED ORDER — ONDANSETRON 4 MG PO TBDP: 4.0000 mg | ORAL_TABLET | Freq: Three times a day (TID) | ORAL | Status: DC | PRN

## 2015-10-24 MED ORDER — OXYCODONE-ACETAMINOPHEN 5-325 MG PO TABS: 1.0000 | ORAL_TABLET | Freq: Three times a day (TID) | ORAL | Status: DC | PRN

## 2015-10-24 NOTE — Progress Notes (Signed)
Name: Elizabeth Mata   MRN: [id number]    DOB: [birth year]   Date:10/24/2015       Progress Note  Subjective  Chief Complaint  Chief Complaint  Patient presents with  . Urinary Tract Infection    Onset-Saturday, vaginal burning, left flank pain, vaginal discharge-yellow, hestiancy, frequently and getting worst. Patient states she has noticed she was having blood when wiping yesterday.    Hematuria This is a new problem. The current episode started yesterday. She describes the hematuria as gross (red colored urine and also blood on toilet paper when wiping) hematuria. Her pain is at a severity of 6/10 (last night pain was so severe that it woke her up). The pain is moderate. She describes her urine color as dark red. Irritative symptoms do not include frequency. Obstructive symptoms include incomplete emptying. Associated symptoms include dysuria and flank pain. Pertinent negatives include no abdominal pain, chills, fever, inability to urinate, nausea or vomiting. Her past medical history is significant for kidney stones.    Past Medical History  Diagnosis Date  . Syncope     Recurrent over the last 2 years. had workup at [facility] this year with cardiology and neurology. Had echo and 3-week event monitor that were unremarkable. Has had negative EEG. Echo in 11/11 at [facility] showed no significant abnormalities. Possibly vasovagal  . Seizures ([place])     Past Surgical History  Procedure Laterality Date  . Partial hysterectomy    . Foot surgery    . Cholecystectomy    . Breast biopsy Left 1980'S    EXCISIONAL - NEG    Family History  Problem Relation Age of Onset  . Coronary artery disease Neg Hx   . Breast cancer Neg Hx     Social History   Social History  . Marital Status: Legally Separated    Spouse Name: N/A  . Number of Children: N/A  . Years of Education: N/A   Occupational History  . Patent examiner    Social History Main Topics  . Smoking status: Never Smoker    . Smokeless tobacco: Never Used  . Alcohol Use: No  . Drug Use: No  . Sexual Activity:    Partners: Male   Other Topics Concern  . Not on file   Social History Narrative   Regular exercise     Current outpatient prescriptions:  .  fexofenadine (ALLEGRA) 30 MG tablet, Take 30 mg by mouth 2 (two) times daily., Disp: , Rfl:  .  hydrocortisone 2.5 % cream, Apply topically 2 (two) times daily., Disp: 28 g, Rfl: 1 .  hydrocortisone valerate cream (WESTCORT) 0.2 %, Apply 1 application topically 2 (two) times daily., Disp: 45 g, Rfl: 1 .  ibuprofen (ADVIL,MOTRIN) 200 MG tablet, Take 400 mg by mouth 3 (three) times daily.  , Disp: , Rfl:  .  Linaclotide (LINZESS) 145 MCG CAPS capsule, Take 1 capsule (145 mcg total) by mouth daily., Disp: 90 capsule, Rfl: 1 .  topiramate (TOPAMAX) 100 MG tablet, Take 150 mg by mouth 2 (two) times daily., Disp: , Rfl:   Allergies  Allergen Reactions  . Hydrocodone-Acetaminophen   . Metronidazole   . Sulfa Antibiotics    Review of Systems  Constitutional: Negative for fever and chills.  Gastrointestinal: Negative for nausea, vomiting and abdominal pain.  Genitourinary: Positive for dysuria, hematuria, flank pain and incomplete emptying. Negative for frequency.    Objective  Filed Vitals:   10/24/15 1447  BP: 122/86  Pulse: 83  Temp: 98.2 F (36.8 C)  TempSrc: Oral  Resp: 16  Height: 4\' 11"  (1.499 m)  Weight: 133 lb 6.4 oz (60.51 kg)  SpO2: 98%    Physical Exam  Constitutional: She is well-developed, well-nourished, and in no distress.  Cardiovascular: Normal rate, regular rhythm and normal heart sounds.   Pulmonary/Chest: Effort normal and breath sounds normal.  Abdominal: Soft. Bowel sounds are normal. There is tenderness in the left lower quadrant. There is CVA tenderness (left CVA tenderness.).  Nursing note and vitals reviewed.  Assessment & Plan  1. Hematuria  - POCT Urinalysis Dipstick - Urine Culture - Urinalysis, Routine w  reflex microscopic  2. Acute left flank pain Symptoms and exam suspicious for kidney stone. We'll obtain urinalysis, culture, creatinine for evaluation. Patient will receive CT scan of abdomen and pelvis without contrast for evaluation. Started on opioid therapy for pain relief. - CT Abdomen Pelvis Wo Contrast; Future - Basic Metabolic Panel (BMET) - CBC with Differential - oxyCODONE-acetaminophen (PERCOCET/ROXICET) 5-325 MG tablet; Take 1 tablet by mouth every 8 (eight) hours as needed for severe pain.  Dispense: 30 tablet; Refill: 0 - ondansetron (ZOFRAN ODT) 4 MG disintegrating tablet; Take 1 tablet (4 mg total) by mouth every 8 (eight) hours as needed for nausea or vomiting.  Dispense: 30 tablet; Refill: 0    Korie Streat [facility] 10/24/2015 3:05 PM

## 2015-10-25 DIAGNOSIS — R319 Hematuria, unspecified: Secondary | ICD-10-CM | POA: Diagnosis not present

## 2015-10-25 LAB — MICROSCOPIC EXAMINATION
Bacteria, UA: NONE SEEN
Casts: NONE SEEN /lpf

## 2015-10-25 LAB — CBC WITH DIFFERENTIAL/PLATELET
Basophils Absolute: 0 10*3/uL (ref 0.0–0.2)
Basos: 1 %
EOS (ABSOLUTE): 0.1 10*3/uL (ref 0.0–0.4)
Eos: 2 %
Hematocrit: 42.5 % (ref 34.0–46.6)
Hemoglobin: 14.3 g/dL (ref 11.1–15.9)
Immature Grans (Abs): 0 10*3/uL (ref 0.0–0.1)
Immature Granulocytes: 0 %
Lymphocytes Absolute: 3.3 10*3/uL — ABNORMAL HIGH (ref 0.7–3.1)
Lymphs: 54 %
MCH: 30.9 pg (ref 26.6–33.0)
MCHC: 33.6 g/dL (ref 31.5–35.7)
MCV: 92 fL (ref 79–97)
Monocytes Absolute: 0.3 10*3/uL (ref 0.1–0.9)
Monocytes: 6 %
Neutrophils Absolute: 2.2 10*3/uL (ref 1.4–7.0)
Neutrophils: 37 %
Platelets: 375 10*3/uL (ref 150–379)
RBC: 4.63 x10E6/uL (ref 3.77–5.28)
RDW: 13.7 % (ref 12.3–15.4)
WBC: 6 10*3/uL (ref 3.4–10.8)

## 2015-10-25 LAB — URINALYSIS, ROUTINE W REFLEX MICROSCOPIC
Bilirubin, UA: NEGATIVE
Glucose, UA: NEGATIVE
Ketones, UA: NEGATIVE
Nitrite, UA: NEGATIVE
Protein, UA: NEGATIVE
Specific Gravity, UA: 1.006 (ref 1.005–1.030)
Urobilinogen, Ur: 0.2 mg/dL (ref 0.2–1.0)
pH, UA: 7 (ref 5.0–7.5)

## 2015-10-25 LAB — BASIC METABOLIC PANEL
BUN/Creatinine Ratio: 13 (ref 9–23)
BUN: 11 mg/dL (ref 6–24)
CO2: 22 mmol/L (ref 18–29)
Calcium: 10.3 mg/dL — ABNORMAL HIGH (ref 8.7–10.2)
Chloride: 107 mmol/L — ABNORMAL HIGH (ref 97–106)
Creatinine, Ser: 0.87 mg/dL (ref 0.57–1.00)
GFR calc Af Amer: 88 mL/min/{1.73_m2} (ref >59)
GFR calc non Af Amer: 76 mL/min/{1.73_m2} (ref >59)
Glucose: 92 mg/dL (ref 65–99)
Potassium: 4.3 mmol/L (ref 3.5–5.2)
Sodium: 145 mmol/L — ABNORMAL HIGH (ref 136–144)

## 2015-10-25 LAB — URINE CULTURE

## 2015-10-26 DIAGNOSIS — R300 Dysuria: Secondary | ICD-10-CM | POA: Diagnosis not present

## 2015-10-26 DIAGNOSIS — N3001 Acute cystitis with hematuria: Secondary | ICD-10-CM | POA: Diagnosis not present

## 2015-10-26 LAB — URINE CULTURE

## 2015-10-30 ENCOUNTER — Encounter: Payer: Self-pay | Admitting: Urology

## 2015-10-30 ENCOUNTER — Ambulatory Visit: Payer: Medicare Other | Admitting: Urology

## 2015-10-30 DIAGNOSIS — Z113 Encounter for screening for infections with a predominantly sexual mode of transmission: Secondary | ICD-10-CM | POA: Diagnosis not present

## 2015-10-30 DIAGNOSIS — N898 Other specified noninflammatory disorders of vagina: Secondary | ICD-10-CM | POA: Diagnosis not present

## 2015-10-31 ENCOUNTER — Encounter: Payer: Self-pay | Admitting: Family Medicine

## 2015-10-31 ENCOUNTER — Ambulatory Visit (INDEPENDENT_AMBULATORY_CARE_PROVIDER_SITE_OTHER): Payer: Medicare Other | Admitting: Family Medicine

## 2015-10-31 VITALS — BP 112/68 | HR 78 | Temp 97.8°F | Resp 16 | Ht 59.0 in | Wt 131.4 lb

## 2015-10-31 DIAGNOSIS — I6529 Occlusion and stenosis of unspecified carotid artery: Secondary | ICD-10-CM | POA: Diagnosis not present

## 2015-10-31 DIAGNOSIS — A499 Bacterial infection, unspecified: Secondary | ICD-10-CM | POA: Diagnosis not present

## 2015-10-31 DIAGNOSIS — Z113 Encounter for screening for infections with a predominantly sexual mode of transmission: Secondary | ICD-10-CM

## 2015-10-31 DIAGNOSIS — N898 Other specified noninflammatory disorders of vagina: Secondary | ICD-10-CM | POA: Insufficient documentation

## 2015-10-31 DIAGNOSIS — N760 Acute vaginitis: Secondary | ICD-10-CM

## 2015-10-31 DIAGNOSIS — B9689 Other specified bacterial agents as the cause of diseases classified elsewhere: Secondary | ICD-10-CM

## 2015-10-31 LAB — POCT WET PREP WITH KOH
KOH Prep POC: NEGATIVE
Trichomonas, UA: NEGATIVE

## 2015-10-31 MED ORDER — CLINDAMYCIN PHOSPHATE 2 % VA CREA: 1.0000 | TOPICAL_CREAM | Freq: Every day | VAGINAL | Status: DC

## 2015-10-31 NOTE — Progress Notes (Signed)
Name: Elizabeth Mata   MRN: [id number]    DOB: [birth year]   Date:10/31/2015       Progress Note  Subjective  Chief Complaint  Chief Complaint  Patient presents with  . Vaginal Discharge    patient stated that she had some discharge for about a week. patient stated that there is no odor and no burning. patient was seen at an urgent care and was given a rx for Macrobid. patient has one day left.    HPI  Elizabeth Mata is a 54 year old female here with concerns of vaginal discharge for about a week. Patient stated that there is no odor and no burning. Patient was seen at an urgent care and was given a rx for Macrobid for UTI. Patient has one day left to finish abx. She notes having intercourse with her usual female partner about 2 weeks ago and the condom did break. He has been unfaithful before.   Past Medical History  Diagnosis Date  . Syncope     Recurrent over the last 2 years. had workup at [facility] this year with cardiology and neurology. Had echo and 3-week event monitor that were unremarkable. Has had negative EEG. Echo in 11/11 at [facility] showed no significant abnormalities. Possibly vasovagal  . Seizures [facility])     Patient Active Problem List   Diagnosis Date Noted  . Acute left flank pain 10/24/2015  . Kidney stone on left side 10/24/2015  . Annual physical exam 09/13/2015  . Encounter for screening mammogram for malignant neoplasm of breast 09/13/2015  . Need for immunization against influenza 09/13/2015  . Eczema 09/13/2015  . Irritable bowel syndrome with constipation 09/13/2015  . Seizure ([place]) 08/16/2015  . Carpal tunnel syndrome 07/26/2014  . Carotid artery narrowing 07/05/2013  . 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 Smoking status: Never Smoker   . Smokeless tobacco: Never Used  . Alcohol Use: No     Current outpatient prescriptions:  .  fexofenadine (ALLEGRA) 30 MG tablet, Take 30 mg by mouth 2 (two) times daily.,  Disp: , Rfl:  .  hydrocortisone 2.5 % cream, Apply topically 2 (two) times daily., Disp: 28 g, Rfl: 1 .  hydrocortisone valerate cream (WESTCORT) 0.2 %, Apply 1 application topically 2 (two) times daily., Disp: 45 g, Rfl: 1 .  ibuprofen (ADVIL,MOTRIN) 200 MG tablet, Take 400 mg by mouth 3 (three) times daily.  , Disp: , Rfl:  .  Linaclotide (LINZESS) 145 MCG CAPS capsule, Take 1 capsule (145 mcg total) by mouth daily., Disp: 90 capsule, Rfl: 1 .  nitrofurantoin, macrocrystal-monohydrate, (MACROBID) 100 MG capsule, TAKE 1 CAPSULE BY ORAL ROUTE EVERY 12 HOURS WITH FOOD, Disp: , Rfl: 0 .  ondansetron (ZOFRAN ODT) 4 MG disintegrating tablet, Take 1 tablet (4 mg total) by mouth every 8 (eight) hours as needed for nausea or vomiting., Disp: 30 tablet, Rfl: 0 .  oxyCODONE-acetaminophen (PERCOCET/ROXICET) 5-325 MG tablet, Take 1 tablet by mouth every 8 (eight) hours as needed for severe pain., Disp: 30 tablet, Rfl: 0 .  topiramate (TOPAMAX) 100 MG tablet, Take 150 mg by mouth 2 (two) times daily., Disp: , Rfl:   Allergies  Allergen Reactions  . Hydrocodone-Acetaminophen   . Metronidazole   . Sulfa Antibiotics     Review of Systems  Positive for vaginal discharge as mentioned in HPI, otherwise all systems reviewed and are negative.  Objective  BP 112/68 mmHg  Pulse 78  Temp(Src) 97.8 F (36.6 C) (Oral)  Resp 16  Ht 4\' 11"  (1.499 m)  Wt 131 lb 6.4 oz (59.603 kg)  BMI 26.53 kg/m2  SpO2 99%  Body mass index is 26.53 kg/(m^2).   Physical Exam  Constitutional: Patient appears well-developed and well-nourished. In no distress.  Cardiovascular: Normal rate, regular rhythm and normal heart sounds.  No murmur heard.  Pulmonary/Chest: Effort normal and breath sounds normal. No respiratory distress. FEMALE GENITALIA:  External genitalia normal External urethra normal Vaginal vault normal with scant white-yellow discharge Cervix normal without discharge or lesions Bimanual exam normal  without masses Skin: Skin is warm and dry. No rash noted. No erythema.  Psychiatric: Patient has a normal mood and affect. Behavior is normal in office today. Judgment and thought content normal in office today.   Results for orders placed or performed in visit on 10/31/15 (from the past 24 hour(s))  POCT Wet Prep with KOH     Status: Abnormal   Collection Time: 10/31/15 11:35 AM  Result Value Ref Range   Trichomonas, UA Negative    Clue Cells Wet Prep HPF POC Many    Epithelial Wet Prep HPF POC Few Few, Moderate, Many, Too numerous to count   Yeast Wet Prep HPF POC None    Bacteria Wet Prep HPF POC Many (A) None, Few, Too numerous to count   RBC Wet Prep HPF POC None    WBC Wet Prep HPF POC Few    KOH Prep POC Negative     Assessment & Plan  1. Bacterial vaginosis  - clindamycin (CLEOCIN) 2 % vaginal cream; Place 1 Applicatorful vaginally at bedtime.  Dispense: 40 g; Refill: 0 - Chlamydia/Gonococcus/Trichomonas, NAA  2. Screening for STD (sexually transmitted disease)  - Chlamydia/Gonococcus/Trichomonas, NAA

## 2015-10-31 NOTE — Patient Instructions (Signed)

## 2015-11-01 LAB — CHLAMYDIA/GONOCOCCUS/TRICHOMONAS, NAA: Trich vag by NAA: NEGATIVE

## 2015-11-06 ENCOUNTER — Telehealth: Payer: Self-pay

## 2015-11-06 NOTE — Telephone Encounter (Signed)
Patient called stating that the medication that she was given for the Bacterial Vaginosis has broken her out in hives. She stated that the vaginal area has cleared up, so I told her to stop taking that medication and to start taking Benadryl and an over the count antihistamine such as Claritin or Zyrtec to combat the itching. I also told her that she could use otc hydrocortisone cream. She was encouraged to increase her water intake and to give [place] a call back if her hives does not clear up in a few days but if her sx worsens to call 911. She agreed and said thanks.

## 2015-11-08 ENCOUNTER — Telehealth: Payer: Self-pay | Admitting: Family Medicine

## 2015-11-08 DIAGNOSIS — L239 Allergic contact dermatitis, unspecified cause: Secondary | ICD-10-CM | POA: Diagnosis not present

## 2015-11-08 DIAGNOSIS — L282 Other prurigo: Secondary | ICD-10-CM | POA: Diagnosis not present

## 2015-11-08 NOTE — Telephone Encounter (Signed)
On 11/06/15 AT 9:42am  Patient called stating that the medication (Cleoncin 2%) that she was given for the Bacterial Vaginosis has broken her out in hives. She stated that the vaginal area has cleared up, so I told her to stop taking that medication and to start taking Benadryl and an over the counter antihistamine such as Claritin or Zyrtec to combat the itching. I also told her that she could use otc hydrocortisone cream. She was encouraged to increase her water intake and to give [place] a call back if her hives does not clear up in a few days but if her sx worsens to call 911. She agreed and said thanks.

## 2015-11-08 NOTE — Telephone Encounter (Signed)
She can try adding Zantac 150 mg twice daily to decrease itching, even though it is a reflux medication it also improves allergic reactions. She can also come in to be seen

## 2015-11-08 NOTE — Telephone Encounter (Signed)
Was prescribed a medication that broke her out in hives over the weekend and was told that if the hives did not improve for her to call back on Wednesday and Dr Nadine Counts would call in something to help. Patient states that it has not improved and she would like something called in to [facility]

## 2015-11-08 NOTE — Telephone Encounter (Signed)
Pt says that her hives are not any better and she needs some direction on what to do . She said you all were going to call her something in to [place].

## 2015-11-09 NOTE — Telephone Encounter (Signed)
PT NOTIFIED AND ASKED IF SHE NEEDED AN APPT AND SHE SAID NOT AT THIS TIME.

## 2015-11-10 ENCOUNTER — Emergency Department
Admission: EM | Admit: 2015-11-10 | Discharge: 2015-11-10 | Disposition: A | Discharge status: Home / Self Care | Payer: Medicare Other | Attending: Emergency Medicine | Admitting: Emergency Medicine

## 2015-11-10 ENCOUNTER — Encounter: Payer: Self-pay | Admitting: Emergency Medicine

## 2015-11-10 DIAGNOSIS — L500 Allergic urticaria: Secondary | ICD-10-CM | POA: Diagnosis not present

## 2015-11-10 DIAGNOSIS — Z792 Long term (current) use of antibiotics: Secondary | ICD-10-CM | POA: Insufficient documentation

## 2015-11-10 DIAGNOSIS — T368X5A Adverse effect of other systemic antibiotics, initial encounter: Secondary | ICD-10-CM | POA: Diagnosis not present

## 2015-11-10 DIAGNOSIS — R21 Rash and other nonspecific skin eruption: Secondary | ICD-10-CM | POA: Diagnosis present

## 2015-11-10 DIAGNOSIS — Z79899 Other long term (current) drug therapy: Secondary | ICD-10-CM | POA: Insufficient documentation

## 2015-11-10 DIAGNOSIS — T50905A Adverse effect of unspecified drugs, medicaments and biological substances, initial encounter: Secondary | ICD-10-CM

## 2015-11-10 DIAGNOSIS — Z791 Long term (current) use of non-steroidal anti-inflammatories (NSAID): Secondary | ICD-10-CM | POA: Insufficient documentation

## 2015-11-10 MED ORDER — RANITIDINE HCL 150 MG PO TABS: 150.0000 mg | ORAL_TABLET | Freq: Two times a day (BID) | ORAL | Status: DC

## 2015-11-10 MED ORDER — METHYLPREDNISOLONE SODIUM SUCC 125 MG IJ SOLR
80.0000 mg | Freq: Once | INTRAMUSCULAR | Status: AC
  Administered 2015-11-10: 80 mg via INTRAMUSCULAR
  Filled 2015-11-10: qty 2

## 2015-11-10 NOTE — Discharge Instructions (Signed)
Begin Taking Zantac which will help with itching and will not cause drowsiness like Benadryl. You may also take Benadryl at night as needed for itching. Follow-up with your doctor if any continued problems. Return to the emergency room if any severe worsening of your symptoms.

## 2015-11-10 NOTE — ED Provider Notes (Signed)
[facility] Emergency Department Provider Note  ____________________________________________  Time seen: Approximately 11:42 AM  I have reviewed the triage vital signs and the nursing notes.   HISTORY  Chief Complaint Urticaria  HPI Jin Labarge is a 54 y.o. female is here complaining of rash. Patient states that she was given clindamycin cream 5 days ago and after 3 days begin breaking out in hives. She called [facility] who told to stop the clindamycin and start taking Benadryl. They also called in a Medrol Dosepak which she started on Tuesday. She states there is no improvement in that she continues to itch all over. She has been taking Benadryl which makes her extremely sleepy and therefore she can't function. She denies any difficulty breathing, swallowing or talking. She still continues to itch all over and has a rash to her torso and upper and lower extremities.Itching is constant. Patient denies any pain rating it 0/10.   Past Medical History  Diagnosis Date  . Syncope     Recurrent over the last 2 years. had workup at [facility] this year with cardiology and neurology. Had echo and 3-week event monitor that were unremarkable. Has had negative EEG. Echo in 11/11 at [facility] showed no significant abnormalities. Possibly vasovagal  . Seizures [facility])     Patient Active Problem List   Diagnosis Date Noted  . Vaginal discharge 10/31/2015  . Screening for STD (sexually transmitted disease) 10/31/2015  . Acute left flank pain 10/24/2015  . Kidney stone on left side 10/24/2015  . Annual physical exam 09/13/2015  . Encounter for screening mammogram for malignant neoplasm of breast 09/13/2015  . Need for immunization against influenza 09/13/2015  . Eczema 09/13/2015  . Irritable bowel syndrome with constipation 09/13/2015  . Seizure ([place]) 08/16/2015  . Carpal tunnel syndrome 07/26/2014  . Carotid artery narrowing 07/05/2013  . Basilar artery migraine  07/06/2012    Past Surgical History  Procedure Laterality Date  . Partial hysterectomy    . Foot surgery    . Cholecystectomy    . Breast biopsy Left 1980'S    EXCISIONAL - NEG    Current Outpatient Rx  Name  Route  Sig  Dispense  Refill  . clindamycin (CLEOCIN) 2 % vaginal cream   Vaginal   Place 1 Applicatorful vaginally at bedtime.   40 g   0   . fexofenadine (ALLEGRA) 30 MG tablet   Oral   Take 30 mg by mouth 2 (two) times daily.         . hydrocortisone 2.5 % cream   Topical   Apply topically 2 (two) times daily.   28 g   1   . hydrocortisone valerate cream (WESTCORT) 0.2 %   Topical   Apply 1 application topically 2 (two) times daily.   45 g   1   . ibuprofen (ADVIL,MOTRIN) 200 MG tablet   Oral   Take 400 mg by mouth 3 (three) times daily.           Marland Kitchen Linaclotide (LINZESS) 145 MCG CAPS capsule   Oral   Take 1 capsule (145 mcg total) by mouth daily.   90 capsule   1   . nitrofurantoin, macrocrystal-monohydrate, (MACROBID) 100 MG capsule      TAKE 1 CAPSULE BY ORAL ROUTE EVERY 12 HOURS WITH FOOD      0   . ondansetron (ZOFRAN ODT) 4 MG disintegrating tablet   Oral   Take 1 tablet (4 mg total) by mouth  every 8 (eight) hours as needed for nausea or vomiting.   30 tablet   0   . oxyCODONE-acetaminophen (PERCOCET/ROXICET) 5-325 MG tablet   Oral   Take 1 tablet by mouth every 8 (eight) hours as needed for severe pain.   30 tablet   0   . ranitidine (ZANTAC) 150 MG tablet   Oral   Take 1 tablet (150 mg total) by mouth 2 (two) times daily.   30 tablet   1   . topiramate (TOPAMAX) 100 MG tablet   Oral   Take 150 mg by mouth 2 (two) times daily.           Allergies Clindamycin/lincomycin; Hydrocodone-acetaminophen; Metronidazole; and Sulfa antibiotics  Family History  Problem Relation Age of Onset  . Coronary artery disease Neg Hx   . Breast cancer Neg Hx     Social History Social History  Substance Use Topics  . Smoking status:  Never Smoker   . Smokeless tobacco: Never Used  . Alcohol Use: No    Review of Systems Constitutional: No fever/chills Eyes: No visual changes. ENT: No sore throat. Cardiovascular: Denies chest pain. Respiratory: Denies shortness of breath. Gastrointestinal: No abdominal pain.  No nausea, no vomiting.   Musculoskeletal: Negative for back pain. Skin: Positive for rash and itching Neurological: Negative for headaches, focal weakness or numbness.  10-point ROS otherwise negative.  ____________________________________________   PHYSICAL EXAM:  VITAL SIGNS: ED Triage Vitals  Enc Vitals Group     BP 11/10/15 1124 142/99 mmHg     Pulse Rate 11/10/15 1124 89     Resp 11/10/15 1124 16     Temp 11/10/15 1124 98.2 F (36.8 C)     Temp Source 11/10/15 1124 Oral     SpO2 11/10/15 1124 100 %     Weight 11/10/15 1124 130 lb (58.968 kg)     Height 11/10/15 1124 4\' 11"  (1.499 m)     Head Cir --      Peak Flow --      Pain Score 11/10/15 1125 0     Pain Loc --      Pain Edu? --      Excl. in [place]? --     Constitutional: Alert and oriented. Well appearing and in no acute distress. Eyes: Conjunctivae are normal. PERRL. EOMI. Head: Atraumatic. Nose: No congestion/rhinnorhea. Mouth/Throat: No edema. No erythema or exudate noted. Neck: No stridor.  Supple Cardiovascular: Normal rate, regular rhythm. Grossly normal heart sounds.  Good peripheral circulation. Respiratory: Normal respiratory effort.  No retractions. Lungs CTAB. Gastrointestinal: Soft and nontender. No distention. Musculoskeletal: No lower extremity tenderness nor edema.  No joint effusions. Neurologic:  Normal speech and language. No gross focal neurologic deficits are appreciated. No gait instability. Skin:  Skin is warm, dry and intact. There is a erythematous papular/macular rash diffusely scattered over the torso upper and lower extremities. Patient is noted to be rubbing and scratching frequently. Psychiatric: Mood  and affect are normal. Speech and behavior are normal.  ____________________________________________   LABS (all labs ordered are listed, but only abnormal results are displayed)  Labs Reviewed - No data to display  PROCEDURES  Procedure(s) performed: None  Critical Care performed: No  ____________________________________________   INITIAL IMPRESSION / ASSESSMENT AND PLAN / ED COURSE  Pertinent labs & imaging results that were available during my care of the patient were reviewed by me and considered in my medical decision making (see chart for details).  Patient was given Solu-Medrol  IM while in the emergency room. She is encouraged to continue Benadryl when she is at home she is also given a prescription for Zantac 150 mg 1 twice a day and told that this would help with itching but not cause any drowsiness. She is to follow-up with our standard any continued problems. She is also told she may return to the emergency room if any severe worsening of her symptoms. ____________________________________________   FINAL CLINICAL IMPRESSION(S) / ED DIAGNOSES  Final diagnoses:  Urticaria due to drug allergy      Johnn Hai, PA-C 11/10/15 1241  Earleen Newport, MD 11/10/15 [phone]

## 2015-11-10 NOTE — ED Notes (Signed)
Patient states she was taking Clindamycin 2% cream for 5 days. After 3 days of using broke out in hives.  Called [facility], stopped using Clindamycin and started benadryl and then was started on Methylprednisolone 4 mg tablets on Tuesday, and rash has not improved.  C/O itching all over.

## 2015-11-13 ENCOUNTER — Telehealth: Payer: Self-pay | Admitting: Family Medicine

## 2015-11-13 ENCOUNTER — Other Ambulatory Visit: Payer: Self-pay | Admitting: Family Medicine

## 2015-11-13 MED ORDER — HYDROXYZINE HCL 10 MG PO TABS: 10.0000 mg | ORAL_TABLET | Freq: Three times a day (TID) | ORAL | Status: DC | PRN

## 2015-11-13 MED ORDER — PREDNISONE 10 MG (21) PO TBPK: ORAL_TABLET | ORAL | Status: DC

## 2015-11-13 NOTE — Telephone Encounter (Signed)
Patient is still broken out with hives and it has also reached her vagina area (which is now raw). Asking that you please call her something in for the hives and the vagina area. She has tried the benadryl and cream. Did go to the ER and was given a shot and they still will not go away. Please send to [place].

## 2015-11-13 NOTE — Telephone Encounter (Signed)
Prednisone taper pack for hives and atarax for itching sent to her pharmacy.

## 2015-11-14 NOTE — Telephone Encounter (Signed)
Patient informed. 

## 2015-11-21 DIAGNOSIS — G43109 Migraine with aura, not intractable, without status migrainosus: Secondary | ICD-10-CM | POA: Diagnosis not present

## 2015-12-04 ENCOUNTER — Encounter: Payer: Self-pay | Admitting: Urology

## 2015-12-04 ENCOUNTER — Ambulatory Visit: Payer: Medicare Other | Admitting: Urology

## 2015-12-04 ENCOUNTER — Encounter: Payer: Self-pay | Admitting: *Deleted

## 2015-12-27 ENCOUNTER — Ambulatory Visit (INDEPENDENT_AMBULATORY_CARE_PROVIDER_SITE_OTHER): Payer: Medicare Other | Admitting: Family Medicine

## 2015-12-27 ENCOUNTER — Encounter: Payer: Self-pay | Admitting: Family Medicine

## 2015-12-27 VITALS — BP 116/78 | HR 81 | Temp 98.3°F | Resp 12 | Wt 131.1 lb

## 2015-12-27 DIAGNOSIS — I6529 Occlusion and stenosis of unspecified carotid artery: Secondary | ICD-10-CM | POA: Diagnosis not present

## 2015-12-27 DIAGNOSIS — R234 Changes in skin texture: Secondary | ICD-10-CM | POA: Diagnosis not present

## 2015-12-27 NOTE — Progress Notes (Signed)
Name: Elizabeth Mata   MRN: [id number]    DOB: 05-12-1961   Date:12/27/2015       Progress Note  Subjective  Chief Complaint  Chief Complaint  Patient presents with  . Referral    dermatology. facial discoloration.    HPI  Elizabeth Mata is a 54 year old female with complaints of ongoing issues with her skin. Onset Summer 2016, aggravated by brief dermatitis allergic reaction to oral antibiotics, but now she has lingering papular rough skin on nose and cheeks. She states the area turns red with scratching and then hyperpigments despite using topical steroid prescription cream and heavy cream. Denies using excessive irritative make ups, facial creams or other products. She would like dermatology consultation.    Past Medical History  Diagnosis Date  . Syncope     Recurrent over the last 2 years. had workup at [facility] this year with cardiology and neurology. Had echo and 3-week event monitor that were unremarkable. Has had negative EEG. Echo in 11/11 at [facility] showed no significant abnormalities. Possibly vasovagal  . Seizures ([place])   . Hemorrhoid   . GERD (gastroesophageal reflux disease)   . Trigger thumb   . Cyst of breast, left, solitary   . Kidney stones   . Flank pain     Patient Active Problem List   Diagnosis Date Noted  . Vaginal discharge 10/31/2015  . Screening for STD (sexually transmitted disease) 10/31/2015  . Acute left flank pain 10/24/2015  . Kidney stone on left side 10/24/2015  . Annual physical exam 09/13/2015  . Encounter for screening mammogram for malignant neoplasm of breast 09/13/2015  . Need for immunization against influenza 09/13/2015  . Eczema 09/13/2015  . Irritable bowel syndrome with constipation 09/13/2015  . Seizure ([place]) 08/16/2015  . Carpal tunnel syndrome 07/26/2014  . Carotid artery narrowing 07/05/2013  . 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 Smoking status: Never Smoker   . Smokeless  tobacco: Never Used  . Alcohol Use: 0.0 oz/week    0 Standard drinks or equivalent per week     Comment: occ     Current outpatient prescriptions:  .  clindamycin (CLEOCIN) 2 % vaginal cream, Place 1 Applicatorful vaginally at bedtime., Disp: 40 g, Rfl: 0 .  fexofenadine (ALLEGRA) 30 MG tablet, Take 30 mg by mouth 2 (two) times daily., Disp: , Rfl:  .  hydrocortisone 2.5 % cream, Apply topically 2 (two) times daily., Disp: 28 g, Rfl: 1 .  hydrocortisone valerate cream (WESTCORT) 0.2 %, Apply 1 application topically 2 (two) times daily., Disp: 45 g, Rfl: 1 .  hydrOXYzine (ATARAX/VISTARIL) 10 MG tablet, Take 1 tablet (10 mg total) by mouth 3 (three) times daily as needed., Disp: 30 tablet, Rfl: 0 .  ibuprofen (ADVIL,MOTRIN) 200 MG tablet, Take 400 mg by mouth 3 (three) times daily.  , Disp: , Rfl:  .  Linaclotide (LINZESS) 145 MCG CAPS capsule, Take 1 capsule (145 mcg total) by mouth daily., Disp: 90 capsule, Rfl: 1 .  ondansetron (ZOFRAN ODT) 4 MG disintegrating tablet, Take 1 tablet (4 mg total) by mouth every 8 (eight) hours as needed for nausea or vomiting., Disp: 30 tablet, Rfl: 0 .  oxyCODONE-acetaminophen (PERCOCET/ROXICET) 5-325 MG tablet, Take 1 tablet by mouth every 8 (eight) hours as needed for severe pain., Disp: 30 tablet, Rfl: 0 .  ranitidine (ZANTAC) 150 MG tablet, Take 1 tablet (150 mg total) by mouth 2 (two) times daily.,  Disp: 30 tablet, Rfl: 1 .  topiramate (TOPAMAX) 100 MG tablet, Take 150 mg by mouth 2 (two) times daily., Disp: , Rfl:   Allergies  Allergen Reactions  . Clindamycin/Lincomycin Hives  . Hydrocodone-Acetaminophen   . Metronidazole   . Sulfa Antibiotics     Review of Systems  Positive for skin color/texture changes as mentioned in HPI, otherwise all systems reviewed and are negative.  Objective  BP 116/78 mmHg  Pulse 81  Temp(Src) 98.3 F (36.8 C) (Oral)  Resp 12  Wt 131 lb 1.6 oz (59.467 kg)  SpO2 94%  Body mass index is 26.47  kg/(m^2).   Physical Exam  Constitutional: Patient appears well-developed and well-nourished. In no distress.  Cardiovascular: Normal rate, regular rhythm and normal heart sounds.  No murmur heard.  Pulmonary/Chest: Effort normal and breath sounds normal. No respiratory distress. Skin: Skin is warm and dry. Very minimal find papular dry skin over nose extending onto cheeks with questionable hyperpigmentation.  Psychiatric: Patient has a normal mood and affect. Behavior is normal in office today. Judgment and thought content normal in office today.    Assessment & Plan   1. Skin texture changes  - Ambulatory referral to Dermatology

## 2016-01-03 ENCOUNTER — Telehealth: Payer: Self-pay | Admitting: Family Medicine

## 2016-01-03 ENCOUNTER — Other Ambulatory Visit: Payer: Self-pay | Admitting: Family Medicine

## 2016-01-03 DIAGNOSIS — B3731 Acute candidiasis of vulva and vagina: Secondary | ICD-10-CM

## 2016-01-03 DIAGNOSIS — B373 Candidiasis of vulva and vagina: Secondary | ICD-10-CM

## 2016-01-03 MED ORDER — FLUCONAZOLE 150 MG PO TABS: 150.0000 mg | ORAL_TABLET | Freq: Once | ORAL | Status: DC

## 2016-01-03 NOTE — Telephone Encounter (Signed)
Patient has yeast infection and is requesting a prescription for diflucan. Please send to [place]. The itching began Sunday no odor no discharge.

## 2016-01-03 NOTE — Telephone Encounter (Signed)
Diflucan sent to [facility]. 

## 2016-04-08 DIAGNOSIS — L218 Other seborrheic dermatitis: Secondary | ICD-10-CM | POA: Diagnosis not present

## 2016-04-16 ENCOUNTER — Telehealth: Payer: Self-pay | Admitting: Family Medicine

## 2016-04-16 DIAGNOSIS — Z1239 Encounter for other screening for malignant neoplasm of breast: Secondary | ICD-10-CM

## 2016-04-16 NOTE — Telephone Encounter (Signed)
Patient called back stating no problems! Normal self breast exams, no lumps or discharge.  Normal mammos in past

## 2016-04-16 NOTE — Telephone Encounter (Signed)
I called to find out if screening purposes or if she has found something worrisome; I called, left detailed voicemail msg; need more information and I'll be glad to order test ------------------ When patient calls back, please find out if this is for screening purposes only (no breast masses, discharge, etc), or has she found a lump or some other breast concern that she wants checked out? If so, I need to know which breast and where Thank you

## 2016-04-16 NOTE — Telephone Encounter (Signed)
Dr Nadine Counts patient: requesting referral for mammogram.

## 2016-04-17 DIAGNOSIS — Z1239 Encounter for other screening for malignant neoplasm of breast: Secondary | ICD-10-CM | POA: Insufficient documentation

## 2016-04-17 NOTE — Telephone Encounter (Signed)
mammo order entered; thank you

## 2016-04-17 NOTE — Telephone Encounter (Signed)
pT NOTIFIED TO CALL AND SETUP TIME BEST FOR HER

## 2016-04-19 ENCOUNTER — Emergency Department: Payer: Medicare Other

## 2016-04-19 ENCOUNTER — Emergency Department
Admission: EM | Admit: 2016-04-19 | Discharge: 2016-04-19 | Disposition: A | Discharge status: Home / Self Care | Payer: Medicare Other | Attending: Emergency Medicine | Admitting: Emergency Medicine

## 2016-04-19 DIAGNOSIS — L03113 Cellulitis of right upper limb: Secondary | ICD-10-CM | POA: Diagnosis not present

## 2016-04-19 DIAGNOSIS — R2241 Localized swelling, mass and lump, right lower limb: Secondary | ICD-10-CM | POA: Diagnosis present

## 2016-04-19 DIAGNOSIS — S9001XA Contusion of right ankle, initial encounter: Secondary | ICD-10-CM | POA: Diagnosis not present

## 2016-04-19 DIAGNOSIS — M79671 Pain in right foot: Secondary | ICD-10-CM

## 2016-04-19 DIAGNOSIS — L03115 Cellulitis of right lower limb: Secondary | ICD-10-CM | POA: Diagnosis not present

## 2016-04-19 DIAGNOSIS — Z79899 Other long term (current) drug therapy: Secondary | ICD-10-CM | POA: Insufficient documentation

## 2016-04-19 LAB — URIC ACID: Uric Acid, Serum: 4.9 mg/dL (ref 2.3–6.6)

## 2016-04-19 LAB — CBC WITH DIFFERENTIAL/PLATELET
Basophils Absolute: 0 10*3/uL (ref 0–0.1)
Basophils Relative: 1 %
Eosinophils Absolute: 0 10*3/uL (ref 0–0.7)
Eosinophils Relative: 1 %
HCT: 41.2 % (ref 35.0–47.0)
Hemoglobin: 14 g/dL (ref 12.0–16.0)
Lymphocytes Relative: 48 %
Lymphs Abs: 2.6 10*3/uL (ref 1.0–3.6)
MCH: 31.5 pg (ref 26.0–34.0)
MCHC: 34.1 g/dL (ref 32.0–36.0)
MCV: 92.4 fL (ref 80.0–100.0)
Monocytes Absolute: 0.3 10*3/uL (ref 0.2–0.9)
Monocytes Relative: 6 %
Neutro Abs: 2.3 10*3/uL (ref 1.4–6.5)
Neutrophils Relative %: 44 %
Platelets: 319 10*3/uL (ref 150–440)
RBC: 4.46 MIL/uL (ref 3.80–5.20)
RDW: 13.1 % (ref 11.5–14.5)
WBC: 5.3 10*3/uL (ref 3.6–11.0)

## 2016-04-19 MED ORDER — NAPROXEN 500 MG PO TABS: 500.0000 mg | ORAL_TABLET | Freq: Two times a day (BID) | ORAL | Status: DC

## 2016-04-19 MED ORDER — KETOROLAC TROMETHAMINE 30 MG/ML IJ SOLN
30.0000 mg | Freq: Once | INTRAMUSCULAR | Status: AC
  Administered 2016-04-19: 30 mg via INTRAMUSCULAR
  Filled 2016-04-19: qty 1

## 2016-04-19 NOTE — ED Notes (Signed)
Pt c/o pain with swelling and bruising to the right medial arch of the foot for the past 2 days.. Denies injury

## 2016-04-19 NOTE — Discharge Instructions (Signed)
Cellulitis Cellulitis is an infection of the skin and the tissue under the skin. The infected area is usually red and tender. This happens most often in the arms and lower legs. HOME CARE   Take your antibiotic medicine as told. Finish the medicine even if you start to feel better.  Keep the infected arm or leg raised (elevated).  Put a warm cloth on the area up to 4 times per day.  Only take medicines as told by your doctor.  Keep all doctor visits as told. GET HELP IF:  You see red streaks on the skin coming from the infected area.  Your red area gets bigger or turns a dark color.  Your bone or joint under the infected area is painful after the skin heals.  Your infection comes back in the same area or different area.  You have a puffy (swollen) bump in the infected area.  You have new symptoms.  You have a fever. GET HELP RIGHT AWAY IF:   You feel very sleepy.  You throw up (vomit) or have watery poop (diarrhea).  You feel sick and have muscle aches and pains.   This information is not intended to replace advice given to you by your health care provider. Make sure you discuss any questions you have with your health care provider.   Document Released: 06/03/2008 Document Revised: 09/06/2015 Document Reviewed: 03/02/2012 Elsevier Interactive Patient Education 2016 Elsevier Inc[employer]  Cryotherapy Cryotherapy is when you put ice on your injury. Ice helps lessen pain and puffiness (swelling) after an injury. Ice works the best when you start using it in the first 24 to 48 hours after an injury. HOME CARE  Put a dry or damp towel between the ice pack and your skin.  You may press gently on the ice pack.  Leave the ice on for no more than 10 to 20 minutes at a time.  Check your skin after 5 minutes to make sure your skin is okay.  Rest at least 20 minutes between ice pack uses.  Stop using ice when your skin loses feeling (numbness).  Do not use ice on someone who  cannot tell you when it hurts. This includes small children and people with memory problems (dementia). GET HELP RIGHT AWAY IF:  You have white spots on your skin.  Your skin turns blue or pale.  Your skin feels waxy or hard.  Your puffiness gets worse. MAKE SURE YOU:   Understand these instructions.  Will watch your condition.  Will get help right away if you are not doing well or get worse.   This information is not intended to replace advice given to you by your health care provider. Make sure you discuss any questions you have with your health care provider.   Document Released: 06/03/2008 Document Revised: 03/09/2012 Document Reviewed: 08/08/2011 Elsevier Interactive Patient Education 2016 [place] therapy can help ease sore, stiff, injured, and tight muscles and joints. Heat relaxes your muscles, which may help ease your pain. Heat therapy should only be used on old, pre-existing, or long-lasting (chronic) injuries. Do not use heat therapy unless told by your doctor. HOW TO USE HEAT THERAPY There are several different kinds of heat therapy, including:  Moist heat pack.  Warm water bath.  Hot water bottle.  Electric heating pad.  Heated gel pack.  Heated wrap.  Electric heating pad. GENERAL HEAT THERAPY RECOMMENDATIONS   Do not sleep while using heat therapy. Only use  heat therapy while you are awake.  Your skin may turn pink while using heat therapy. Do not use heat therapy if your skin turns red.  Do not use heat therapy if you have new pain.  High heat or long exposure to heat can cause burns. Be careful when using heat therapy to avoid burning your skin.  Do not use heat therapy on areas of your skin that are already irritated, such as with a rash or sunburn. GET HELP IF:   You have blisters, redness, swelling (puffiness), or numbness.  You have new pain.  Your pain is worse. MAKE SURE YOU:  Understand these  instructions.  Will watch your condition.  Will get help right away if you are not doing well or get worse.   This information is not intended to replace advice given to you by your health care provider. Make sure you discuss any questions you have with your health care provider.   Document Released: 03/09/2012 Document Revised: 01/06/2015 Document Reviewed: 02/08/2014 Elsevier Interactive Patient Education [employer].

## 2016-04-19 NOTE — ED Provider Notes (Signed)
[facility] Emergency Department Provider Note  ____________________________________________  Time seen: Approximately 2:44 PM  I have reviewed the triage vital signs and the nursing notes.   HISTORY  Chief Complaint Foot Pain    HPI Elizabeth Mata is a 55 y.o. female , NAD, presents to the emergency department with swelling and bruising to the right medial foot. Denies any injuries, falls, traumas. Has had a history of right Achilles tendon release as well as right bunionectomy in the remote past. Denies any numbness, weakness, tingling. Has had no redness, warmth, swelling to the right ankle or foot other than this one specific area. Denies any skin sores, open wounds or lesions. No fever, chills, body aches. Denies any chest pain, back pain, shortness of breath.    Past Medical History  Diagnosis Date  . Syncope     Recurrent over the last 2 years. had workup at [facility] this year with cardiology and neurology. Had echo and 3-week event monitor that were unremarkable. Has had negative EEG. Echo in 11/11 at [facility] showed no significant abnormalities. Possibly vasovagal  . Seizures ([place])   . Hemorrhoid   . GERD (gastroesophageal reflux disease)   . Trigger thumb   . Cyst of breast, left, solitary   . Kidney stones   . Flank pain     Patient Active Problem List   Diagnosis Date Noted  . Breast cancer screening 04/17/2016  . Skin texture changes 12/27/2015  . Vaginal discharge 10/31/2015  . Screening for STD (sexually transmitted disease) 10/31/2015  . Acute left flank pain 10/24/2015  . Kidney stone on left side 10/24/2015  . Annual physical exam 09/13/2015  . Encounter for screening mammogram for malignant neoplasm of breast 09/13/2015  . Need for immunization against influenza 09/13/2015  . Eczema 09/13/2015  . Irritable bowel syndrome with constipation 09/13/2015  . Seizure ([place]) 08/16/2015  . Carpal tunnel syndrome 07/26/2014  . Carotid  artery narrowing 07/05/2013  . 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 Partial hysterectomy    . Foot surgery    . Cholecystectomy    . Breast biopsy Left 1980'S    EXCISIONAL - NEG    Current Outpatient Rx  Name  Route  Sig  Dispense  Refill  . clindamycin (CLEOCIN) 2 % vaginal cream   Vaginal   Place 1 Applicatorful vaginally at bedtime.   40 g   0   . fexofenadine (ALLEGRA) 30 MG tablet   Oral   Take 30 mg by mouth 2 (two) times daily.         . fluconazole (DIFLUCAN) 150 MG tablet   Oral   Take 1 tablet (150 mg total) by mouth once.   1 tablet   0   . hydrocortisone 2.5 % cream   Topical   Apply topically 2 (two) times daily.   28 g   1   . hydrocortisone valerate cream (WESTCORT) 0.2 %   Topical   Apply 1 application topically 2 (two) times daily.   45 g   1   . hydrOXYzine (ATARAX/VISTARIL) 10 MG tablet   Oral   Take 1 tablet (10 mg total) by mouth 3 (three) times daily as needed.   30 tablet   0   . ibuprofen (ADVIL,MOTRIN) 200 MG tablet   Oral   Take 400 mg by mouth 3 (three) times daily.           Marland Kitchen  Linaclotide (LINZESS) 145 MCG CAPS capsule   Oral   Take 1 capsule (145 mcg total) by mouth daily.   90 capsule   1   . naproxen (NAPROSYN) 500 MG tablet   Oral   Take 1 tablet (500 mg total) by mouth 2 (two) times daily with a meal.   14 tablet   0   . ondansetron (ZOFRAN ODT) 4 MG disintegrating tablet   Oral   Take 1 tablet (4 mg total) by mouth every 8 (eight) hours as needed for nausea or vomiting.   30 tablet   0   . oxyCODONE-acetaminophen (PERCOCET/ROXICET) 5-325 MG tablet   Oral   Take 1 tablet by mouth every 8 (eight) hours as needed for severe pain.   30 tablet   0   . ranitidine (ZANTAC) 150 MG tablet   Oral   Take 1 tablet (150 mg total) by mouth 2 (two) times daily.   30 tablet   1   . topiramate (TOPAMAX) 100 MG tablet   Oral   Take 150 mg by mouth 2 (two)  times daily.           Allergies Clindamycin/lincomycin; Hydrocodone-acetaminophen; Metronidazole; and Sulfa antibiotics  Family History  Problem Relation Age of Onset  . Coronary artery disease Maternal Uncle   . Breast cancer Neg Hx   . Diabetes Mellitus II Mother   . Diabetes Maternal Uncle   . Kidney cancer Father   . Arthritis Mother   . Arthritis Maternal Uncle     Social History Social History  Substance Use Topics  . Smoking status: Never Smoker   . Smokeless tobacco: Never Used  . Alcohol Use: 0.0 oz/week    0 Standard drinks or equivalent per week     Comment: occ     Review of Systems  Constitutional: No fever/chills, fatigue Cardiovascular: No chest pain. Respiratory: No shortness of breath. No wheezing.  Gastrointestinal: No abdominal pain.  No nausea, vomiting.  Musculoskeletal: Positive right medial foot, ankle pain. Negative for back, hip, knee pain.  Skin: Positive redness, swelling. Negative for rash, skin sores, open wounds, bruising. Neurological: Negative for headaches, focal weakness or numbness. No tingling 10-point ROS otherwise negative.  ____________________________________________   PHYSICAL EXAM:  VITAL SIGNS: ED Triage Vitals  Enc Vitals Group     BP 04/19/16 1334 122/71 mmHg     Pulse Rate 04/19/16 1334 75     Resp 04/19/16 1334 16     Temp 04/19/16 1334 98.1 F (36.7 C)     Temp Source 04/19/16 1334 Oral     SpO2 04/19/16 1334 100 %     Weight 04/19/16 1334 128 lb (58.06 kg)     Height 04/19/16 1334 4\' 11"  (1.499 m)     Head Cir --      Peak Flow --      Pain Score 04/19/16 1334 7     Pain Loc --      Pain Edu? --      Excl. in [place]? --      Constitutional: Alert and oriented. Well appearing and in no acute distress. Eyes: Conjunctivae are normal.  Head: Atraumatic. Cardiovascular: Normal rate, regular rhythm. Normal S1 and S2.  Good peripheral circulation with 2+ pulses noted in the right lower extremity. Capillary  refill less than 3 seconds in the right lower extremity. Respiratory: Normal respiratory effort without tachypnea or retractions. Lungs CTAB with breath sounds noted in all lung fields. Musculoskeletal: Full  range of motion of right foot, ankle, toes. No laxity with anterior posterior drawer of the right ankle. No lower extremity tenderness nor edema.  No joint effusions. Neurologic:  Normal speech and language. No gross focal neurologic deficits are appreciated.  Skin:  1 cm annular area of erythema noted about the medial ankle above the right arch. No punctate lesions, skin sores, open wounds in this area. No induration or fluctuance noted. No warmth noted to palpation. Area is mildly tender to palpation. Skin is warm, dry and intact.  Psychiatric: Mood and affect are normal. Speech and behavior are normal. Patient exhibits appropriate insight and judgement.   ____________________________________________   LABS (all labs ordered are listed, but only abnormal results are displayed)  Labs Reviewed  URIC ACID  CBC WITH DIFFERENTIAL/PLATELET   ____________________________________________  EKG  None ____________________________________________  RADIOLOGY I have personally viewed and evaluated these images (plain radiographs) as part of my medical decision making, as well as reviewing the written report by the radiologist.  Dg Ankle Complete Right  04/19/2016  CLINICAL DATA:  Patient has pain at the right medial malleolus and medial ankle region with bruising and swelling without injury since this morning. Patient states that she is unable to walk or apply pressure to the area of interest due to pain. EXAM: RIGHT ANKLE - COMPLETE 3+ VIEW COMPARISON:  03/10/2015 MRI FINDINGS: No evidence for acute fracture or subluxation. Small plantar and Achilles spurs are present. Partially imaged K-wire within the first metatarsal. IMPRESSION: No evidence for acute  abnormality. Electronically Signed   By:  Nolon Nations M.D.   On: 04/19/2016 15:41    ____________________________________________    PROCEDURES  Procedure(s) performed: None    Medications  ketorolac (TORADOL) 30 MG/ML injection 30 mg (30 mg Intramuscular Given 04/19/16 1529)     ____________________________________________   INITIAL IMPRESSION / ASSESSMENT AND PLAN / ED COURSE  Pertinent labs & imaging results that were available during my care of the patient were reviewed by me and considered in my medical decision making (see chart for details).  Patient's diagnosis is consistent with cellulitis of right foot causing foot pain. Lab work and x-ray without any significant abnormalities. Patient did note decrease in pain after being given IM Toradol. Patient will be discharged home with prescriptions for naproxen to take as directed. Patient may apply ice to the affected area 20 minutes 3-4 times daily as needed. Advised elevation of the right lower extremity. Patient is to follow up with her primary care provider or orthopedics if no significant improvement by Monday.  Patient is given ED precautions to return to the ED for any worsening or new symptoms.      ____________________________________________  FINAL CLINICAL IMPRESSION(S) / ED DIAGNOSES  Final diagnoses:  Cellulitis of right foot  Right foot pain      NEW MEDICATIONS STARTED DURING THIS VISIT:  New Prescriptions   NAPROXEN (NAPROSYN) 500 MG TABLET    Take 1 tablet (500 mg total) by mouth 2 (two) times daily with a meal.         Braxton Feathers, PA-C 04/19/16 1614  Lisa Roca, MD 04/21/16 1139

## 2016-05-07 DIAGNOSIS — M722 Plantar fascial fibromatosis: Secondary | ICD-10-CM | POA: Diagnosis not present

## 2016-05-07 DIAGNOSIS — M7662 Achilles tendinitis, left leg: Secondary | ICD-10-CM | POA: Diagnosis not present

## 2016-05-07 DIAGNOSIS — M79672 Pain in left foot: Secondary | ICD-10-CM | POA: Diagnosis not present

## 2016-05-07 DIAGNOSIS — M7732 Calcaneal spur, left foot: Secondary | ICD-10-CM | POA: Diagnosis not present

## 2016-05-10 DIAGNOSIS — R319 Hematuria, unspecified: Secondary | ICD-10-CM | POA: Diagnosis not present

## 2016-05-10 DIAGNOSIS — R399 Unspecified symptoms and signs involving the genitourinary system: Secondary | ICD-10-CM | POA: Diagnosis not present

## 2016-05-17 ENCOUNTER — Telehealth: Payer: Self-pay | Admitting: Family Medicine

## 2016-05-17 MED ORDER — PREDNISONE 10 MG PO TABS: ORAL_TABLET | ORAL | Status: AC

## 2016-05-17 NOTE — Telephone Encounter (Signed)
Pt said that she has broken out in hives for the last 3 days. She has had this to happen before. She is asking for a call back to advise. Says that predizone has helped in the past. Pharm is [facility] [place]

## 2016-05-17 NOTE — Telephone Encounter (Signed)
I tried to call her, but couldn't reach her; left detailed msg; if any trouble breathing, tongue or lip swelling, go to urgent care or ER Will send in prednisone; I don't see diabetes in her hx; if any hx of high sugars or DM, be aware this can raise sugars Call if any questions; it would be helpful to know why this is happening

## 2016-06-04 DIAGNOSIS — M7732 Calcaneal spur, left foot: Secondary | ICD-10-CM | POA: Diagnosis not present

## 2016-06-04 DIAGNOSIS — M722 Plantar fascial fibromatosis: Secondary | ICD-10-CM | POA: Diagnosis not present

## 2016-06-06 DIAGNOSIS — M25561 Pain in right knee: Secondary | ICD-10-CM | POA: Diagnosis not present

## 2016-06-19 ENCOUNTER — Telehealth: Payer: Self-pay | Admitting: Family Medicine

## 2016-06-19 NOTE — Telephone Encounter (Signed)
HAD TO GO TO URGENT CARE AND WAS PLACED ON A ANTIOBOTIC AND NOW HAS A YEAST INFECTION. WANTS TO KNOW IF YOU WOULD CALL IN DIFLUCAN. PHARM IS [facility] [place]

## 2016-06-20 DIAGNOSIS — M25561 Pain in right knee: Secondary | ICD-10-CM | POA: Diagnosis not present

## 2016-06-20 DIAGNOSIS — G8929 Other chronic pain: Secondary | ICD-10-CM | POA: Diagnosis not present

## 2016-06-20 MED ORDER — FLUCONAZOLE 150 MG PO TABS: 150.0000 mg | ORAL_TABLET | Freq: Once | ORAL | Status: DC

## 2016-06-20 NOTE — Telephone Encounter (Signed)
Yes, Rx sent 

## 2016-06-28 ENCOUNTER — Ambulatory Visit
Admission: RE | Admit: 2016-06-28 | Discharge: 2016-06-28 | Disposition: A | Discharge status: Home / Self Care | Payer: Medicare Other | Source: Ambulatory Visit | Attending: Family Medicine | Admitting: Family Medicine

## 2016-06-28 ENCOUNTER — Other Ambulatory Visit: Payer: Self-pay | Admitting: Rheumatology

## 2016-06-28 ENCOUNTER — Ambulatory Visit (INDEPENDENT_AMBULATORY_CARE_PROVIDER_SITE_OTHER): Payer: Medicare Other | Admitting: Family Medicine

## 2016-06-28 ENCOUNTER — Encounter: Payer: Self-pay | Admitting: Family Medicine

## 2016-06-28 VITALS — BP 116/78 | HR 72 | Temp 98.8°F | Resp 14 | Wt 131.0 lb

## 2016-06-28 DIAGNOSIS — M25552 Pain in left hip: Secondary | ICD-10-CM | POA: Insufficient documentation

## 2016-06-28 DIAGNOSIS — R208 Other disturbances of skin sensation: Secondary | ICD-10-CM

## 2016-06-28 DIAGNOSIS — R350 Frequency of micturition: Secondary | ICD-10-CM

## 2016-06-28 DIAGNOSIS — R202 Paresthesia of skin: Secondary | ICD-10-CM | POA: Diagnosis not present

## 2016-06-28 DIAGNOSIS — Z862 Personal history of diseases of the blood and blood-forming organs and certain disorders involving the immune mechanism: Secondary | ICD-10-CM

## 2016-06-28 DIAGNOSIS — Z5181 Encounter for therapeutic drug level monitoring: Secondary | ICD-10-CM | POA: Diagnosis not present

## 2016-06-28 DIAGNOSIS — R61 Generalized hyperhidrosis: Secondary | ICD-10-CM | POA: Diagnosis not present

## 2016-06-28 DIAGNOSIS — G8929 Other chronic pain: Secondary | ICD-10-CM | POA: Insufficient documentation

## 2016-06-28 DIAGNOSIS — M25561 Pain in right knee: Secondary | ICD-10-CM

## 2016-06-28 DIAGNOSIS — J3089 Other allergic rhinitis: Secondary | ICD-10-CM

## 2016-06-28 DIAGNOSIS — R200 Anesthesia of skin: Secondary | ICD-10-CM

## 2016-06-28 LAB — COMPLETE METABOLIC PANEL WITH GFR
ALT: 24 U/L (ref 6–29)
AST: 22 U/L (ref 10–35)
Albumin: 4.2 g/dL (ref 3.6–5.1)
Alkaline Phosphatase: 72 U/L (ref 33–130)
BUN: 13 mg/dL (ref 7–25)
CO2: 24 mmol/L (ref 20–31)
Calcium: 9.7 mg/dL (ref 8.6–10.4)
Chloride: 108 mmol/L (ref 98–110)
Creat: 0.82 mg/dL (ref 0.50–1.05)
GFR, Est African American: >89 mL/min (ref >=60)
GFR, Est Non African American: 82 mL/min (ref >=60)
Glucose, Bld: 86 mg/dL (ref 65–99)
Potassium: 4.2 mmol/L (ref 3.5–5.3)
Sodium: 144 mmol/L (ref 135–146)
Total Bilirubin: 0.5 mg/dL (ref 0.2–1.2)
Total Protein: 6.6 g/dL (ref 6.1–8.1)

## 2016-06-28 LAB — CBC WITH DIFFERENTIAL/PLATELET
Basophils Absolute: 0 cells/uL (ref 0–200)
Basophils Relative: 0 %
Eosinophils Absolute: 106 cells/uL (ref 15–500)
Eosinophils Relative: 2 %
HCT: 40.6 % (ref 35.0–45.0)
Hemoglobin: 13.8 g/dL (ref 11.7–15.5)
Lymphocytes Relative: 51 %
Lymphs Abs: 2703 cells/uL (ref 850–3900)
MCH: 31.3 pg (ref 27.0–33.0)
MCHC: 34 g/dL (ref 32.0–36.0)
MCV: 92.1 fL (ref 80.0–100.0)
MPV: 8.5 fL (ref 7.5–12.5)
Monocytes Absolute: 318 cells/uL (ref 200–950)
Monocytes Relative: 6 %
Neutro Abs: 2173 cells/uL (ref 1500–7800)
Neutrophils Relative %: 41 %
Platelets: 323 10*3/uL (ref 140–400)
RBC: 4.41 MIL/uL (ref 3.80–5.10)
RDW: 13.9 % (ref 11.0–15.0)
WBC: 5.3 10*3/uL (ref 3.8–10.8)

## 2016-06-28 LAB — VITAMIN B12: Vitamin B-12: 1093 pg/mL (ref 200–1100)

## 2016-06-28 LAB — TSH: TSH: 0.71 mIU/L

## 2016-06-28 MED ORDER — LEVOCETIRIZINE DIHYDROCHLORIDE 5 MG PO TABS: 5.0000 mg | ORAL_TABLET | Freq: Every evening | ORAL | Status: DC

## 2016-06-28 NOTE — Progress Notes (Signed)
BP 116/78 mmHg  Pulse 72  Temp(Src) 98.8 F (37.1 C) (Oral)  Resp 14  Wt 131 lb (59.421 kg)  SpO2 96%   Subjective:    Patient ID: Elizabeth Mata, female    DOB: 12/08/1961, 55 y.o.   MRN: [id number]  HPI: Elizabeth Mata is a 55 y.o. female  Chief Complaint  Patient presents with  . Numbness    bilateral hands and feet   Having numbness and tingling in the hands and the feet; bothering her for a while; months duration; not sure about anything thyroid issues  She has a cold, might be allergies; she has been on allegra for a few years; sometimes eyes itch, throat itches; allegra just not working; allergic to cut grass, pollen, cats  Allergic to flagyl, hydrocodone, antibiotics  She can lay down at night and her left hip will hurt, going on for years; only when laying down at night no old injuries; she has arthritis in her right knee; Dr. Precious Reel; has to move off the left side when it really hurts; tried ibuprofen, not much relief; he is getting MRI of the right knee, has not heard back  Sometimes has to urinate; urgency, more frequently  Depression screen [facility] 2/9 06/28/2016 12/27/2015 09/13/2015  Decreased Interest 0 0 0  Down, Depressed, Hopeless 0 0 0  PHQ - 2 Score 0 0 0   Relevant past medical, surgical, family and social history reviewed Past Medical History  Diagnosis Date  . Syncope     Recurrent over the last 2 years. had workup at [facility] this year with cardiology and neurology. Had echo and 3-week event monitor that were unremarkable. Has had negative EEG. Echo in 11/11 at [facility] showed no significant abnormalities. Possibly vasovagal  . Seizures ([place])   . Hemorrhoid   . GERD (gastroesophageal reflux disease)   . Trigger thumb   . Cyst of breast, left, solitary   . Kidney stones   . Flank pain   . Allergic rhinitis 06/30/2016   Past Surgical History  Procedure Laterality Date  . Partial hysterectomy    . Foot surgery    . Cholecystectomy     . Breast biopsy Left 1980'S    EXCISIONAL - NEG   Family History  Problem Relation Age of Onset  . Coronary artery disease Maternal Uncle   . Breast cancer Neg Hx   . Diabetes Mellitus II Mother   . Diabetes Maternal Uncle   . Kidney cancer Father   . Arthritis Mother   . Arthritis Maternal Uncle   MD note: diabetes -- sister  Social History  Substance Use Topics  . Smoking status: Never Smoker   . Smokeless tobacco: Never Used  . Alcohol Use: 0.0 oz/week    0 Standard drinks or equivalent per week     Comment: occ   Interim medical history since last visit reviewed. Allergies and medications reviewed  Review of Systems  Chronic hip pain; night sweats Per HPI unless specifically indicated above     Objective:    BP 116/78 mmHg  Pulse 72  Temp(Src) 98.8 F (37.1 C) (Oral)  Resp 14  Wt 131 lb (59.421 kg)  SpO2 96%  Wt Readings from Last 3 Encounters:  06/28/16 131 lb (59.421 kg)  04/19/16 128 lb (58.06 kg)  12/27/15 131 lb 1.6 oz (59.467 kg)    Physical Exam  Constitutional: She appears well-developed and well-nourished. No distress.  HENT:  Head: Normocephalic and atraumatic.  Eyes: EOM are normal. No scleral icterus.  No ptosis or lid lag, no exophthalmos  Neck: No thyromegaly present.  Cardiovascular: Normal rate, regular rhythm and normal heart sounds.   No murmur heard. Pulmonary/Chest: Effort normal and breath sounds normal. No respiratory distress. She has no wheezes.  Abdominal: Soft. Bowel sounds are normal. She exhibits no distension.  Musculoskeletal: Normal range of motion. She exhibits no edema.  Neurological: She is alert. She displays no atrophy and no tremor. She exhibits normal muscle tone. She displays no seizure activity.  No tics; negative Phalen's; intact sensation to monofilament testing hands and feet; grip 5/5, UE and LE 5/5  Skin: Skin is warm and dry. No rash noted. She is not diaphoretic. No pallor.  Psychiatric: She has a normal  mood and affect. Her behavior is normal. Judgment and thought content normal. Her mood appears not anxious. She does not exhibit a depressed mood.  Very pleasant, cooperative      Assessment & Plan:   Problem List Items Addressed This Visit      Respiratory   Allergic rhinitis    Will switch antihistamines        Other   Chronic left hip pain   Relevant Orders   DG HIP UNILAT W OR W/O PELVIS 2-3 VIEWS LEFT (Completed)    Other Visit Diagnoses    Numbness in feet    -  Primary    Relevant Orders    TSH (Completed)    Vitamin B12 (Completed)    Hemoglobin A1c (Completed)    Paresthesia of both hands        Relevant Orders    TSH (Completed)    Vitamin B12 (Completed)    Hemoglobin A1c (Completed)    Urinary frequency        Relevant Orders    Hemoglobin A1c (Completed)    Urinalysis w microscopic + reflex cultur (Completed)    Medication monitoring encounter        Night sweats        Relevant Orders    TSH (Completed)    COMPLETE METABOLIC PANEL WITH GFR (Completed)    CBC with Differential/Platelet (Completed)    History of anemia        Relevant Orders    CBC with Differential/Platelet (Completed)       Follow up plan: Return 3-4 weeks, for follow-up of any unresolved symptoms.  An after-visit summary was printed and given to the patient at [place].  Please see the patient instructions which may contain other information and recommendations beyond what is mentioned above in the assessment and plan.  Meds ordered this encounter  Medications  . levocetirizine (XYZAL) 5 MG tablet    Sig: Take 1 tablet (5 mg total) by mouth every evening. For allergies (this replaces the allegra)    Dispense:  30 tablet    Refill:  11    Orders Placed This Encounter  Procedures  . DG HIP UNILAT W OR W/O PELVIS 2-3 VIEWS LEFT  . TSH  . Vitamin B12  . COMPLETE METABOLIC PANEL WITH GFR  . Hemoglobin A1c  . CBC with Differential/Platelet  . Urinalysis w microscopic +  reflex cultur

## 2016-06-28 NOTE — Patient Instructions (Addendum)
We'll get labs today If we don't find a reason for your symptoms, we'll keep looking and get additional testing I'll be happy to see you for follow-up in 3-4 weeks to check on all of your symptoms if you are still having any issues

## 2016-06-29 LAB — URINALYSIS W MICROSCOPIC + REFLEX CULTURE
Bacteria, UA: NONE SEEN [HPF]
Bilirubin Urine: NEGATIVE
Casts: NONE SEEN [LPF]
Crystals: NONE SEEN [HPF]
Glucose, UA: NEGATIVE
Ketones, ur: NEGATIVE
Leukocytes, UA: NEGATIVE
Nitrite: NEGATIVE
Protein, ur: NEGATIVE
Specific Gravity, Urine: 1.009 (ref 1.001–1.035)
Squamous Epithelial / LPF: NONE SEEN [HPF] (ref <=5)
WBC, UA: NONE SEEN WBC/HPF (ref <=5)
Yeast: NONE SEEN [HPF]
pH: 7.5 (ref 5.0–8.0)

## 2016-06-29 LAB — HEMOGLOBIN A1C
Hgb A1c MFr Bld: 5.7 % — ABNORMAL HIGH (ref <5.7)
Mean Plasma Glucose: 117 mg/dL

## 2016-06-30 ENCOUNTER — Encounter: Payer: Self-pay | Admitting: Family Medicine

## 2016-06-30 DIAGNOSIS — J309 Allergic rhinitis, unspecified: Secondary | ICD-10-CM

## 2016-06-30 HISTORY — DX: Allergic rhinitis, unspecified: J30.9

## 2016-06-30 NOTE — Assessment & Plan Note (Signed)
Will switch antihistamines

## 2016-07-03 ENCOUNTER — Encounter: Payer: Self-pay | Admitting: Family Medicine

## 2016-07-03 ENCOUNTER — Other Ambulatory Visit: Payer: Self-pay | Admitting: Family Medicine

## 2016-07-03 DIAGNOSIS — R319 Hematuria, unspecified: Secondary | ICD-10-CM | POA: Insufficient documentation

## 2016-07-03 NOTE — Assessment & Plan Note (Signed)
Refer to urologist; get CT urogram

## 2016-07-08 ENCOUNTER — Telehealth: Payer: Self-pay | Admitting: Emergency Medicine

## 2016-07-08 NOTE — Telephone Encounter (Signed)
I lalready put those orders in on July 5th; if you don't see them, please double check and let me know

## 2016-07-08 NOTE — Telephone Encounter (Signed)
Patient notified of lab results. Please put in referral for CT and Urology. If you have any information for foods to avoid for Pre diabtes, patient want copy mailed to her

## 2016-07-09 ENCOUNTER — Ambulatory Visit
Admission: RE | Admit: 2016-07-09 | Discharge: 2016-07-09 | Disposition: A | Discharge status: Home / Self Care | Payer: Medicare Other | Source: Ambulatory Visit | Attending: Family Medicine | Admitting: Family Medicine

## 2016-07-09 ENCOUNTER — Other Ambulatory Visit: Payer: Self-pay | Admitting: Family Medicine

## 2016-07-09 DIAGNOSIS — R319 Hematuria, unspecified: Secondary | ICD-10-CM

## 2016-07-09 DIAGNOSIS — N200 Calculus of kidney: Secondary | ICD-10-CM | POA: Insufficient documentation

## 2016-07-09 MED ORDER — IOPAMIDOL (ISOVUE-300) INJECTION 61%
150.0000 mL | Freq: Once | INTRAVENOUS | Status: AC | PRN
  Administered 2016-07-09: 125 mL via INTRAVENOUS

## 2016-07-09 NOTE — Assessment & Plan Note (Signed)
Order abd/pelv with and without

## 2016-07-16 ENCOUNTER — Ambulatory Visit
Admission: RE | Admit: 2016-07-16 | Discharge: 2016-07-16 | Disposition: A | Discharge status: Home / Self Care | Payer: Medicare Other | Source: Ambulatory Visit | Attending: Rheumatology | Admitting: Rheumatology

## 2016-07-16 DIAGNOSIS — M94261 Chondromalacia, right knee: Secondary | ICD-10-CM | POA: Insufficient documentation

## 2016-07-16 DIAGNOSIS — G8929 Other chronic pain: Secondary | ICD-10-CM | POA: Diagnosis not present

## 2016-07-16 DIAGNOSIS — M25561 Pain in right knee: Secondary | ICD-10-CM | POA: Insufficient documentation

## 2016-07-18 ENCOUNTER — Encounter: Payer: Self-pay | Admitting: Family Medicine

## 2016-07-18 ENCOUNTER — Telehealth: Payer: Self-pay | Admitting: Family Medicine

## 2016-07-18 DIAGNOSIS — R319 Hematuria, unspecified: Secondary | ICD-10-CM

## 2016-07-18 DIAGNOSIS — N200 Calculus of kidney: Secondary | ICD-10-CM

## 2016-07-18 DIAGNOSIS — R7301 Impaired fasting glucose: Secondary | ICD-10-CM

## 2016-07-18 HISTORY — DX: Impaired fasting glucose: R73.01

## 2016-07-18 NOTE — Assessment & Plan Note (Signed)
Refer to urologist 

## 2016-07-18 NOTE — Telephone Encounter (Signed)
I called pt about CT report I'll put in referral to urology Avoid dark sodas, avoid sweet tea, stay hydrated She still has numbness in feet; prediabetes; we'll discuss at next visit, consider further testing (UPEP, SPEP, heavy metals) and how to slow progression to frank diabetes

## 2016-07-24 ENCOUNTER — Ambulatory Visit (INDEPENDENT_AMBULATORY_CARE_PROVIDER_SITE_OTHER): Payer: Medicare Other | Admitting: Family Medicine

## 2016-07-24 ENCOUNTER — Encounter: Payer: Self-pay | Admitting: Family Medicine

## 2016-07-24 DIAGNOSIS — L819 Disorder of pigmentation, unspecified: Secondary | ICD-10-CM | POA: Diagnosis not present

## 2016-07-24 DIAGNOSIS — G629 Polyneuropathy, unspecified: Secondary | ICD-10-CM

## 2016-07-24 DIAGNOSIS — R7301 Impaired fasting glucose: Secondary | ICD-10-CM | POA: Diagnosis not present

## 2016-07-24 HISTORY — DX: Polyneuropathy, unspecified: G62.9

## 2016-07-24 LAB — CORTISOL: Cortisol, Plasma: 4.1 ug/dL

## 2016-07-24 NOTE — Patient Instructions (Addendum)
We'll have labs done today We'll see what those show Please do see the diabetic educator We'll get you to a dermatologist Return in early January for fasting labs and visit

## 2016-07-24 NOTE — Assessment & Plan Note (Signed)
Normal TSH, normal B12, A1c only 5.7; will check heavy metals and UPEP and SPEP; if nothing obvious, refer to neurologist

## 2016-07-24 NOTE — Progress Notes (Signed)
BP 110/78   Pulse 67   Temp 99.1 F (37.3 C) (Oral)   Resp 14   Wt 130 lb (59 kg)   SpO2 95%   BMI 26.26 kg/m    Subjective:    Patient ID: Elizabeth Mata, female    DOB: [birth year], 55 y.o.   MRN: [id number]  HPI: Elizabeth Mata is a 55 y.o. female  No chief complaint on file. MD note/CC: "this diabetes thing"  A1c is 5.7, checked on June 30th Diabetes runs on mother's and father's side; PGF died from complications of diabetes, uncles died from complications, sister has diabetes She does not drink tea, does not eat a lot of pasta; does not eat typical Southern diet; eats whole wheat bread, not white bread; she really changed her diet after her uncle died She is trying to stay active; she used to ride her exercise bike every day, but slacked off; she'll get more consistent No pork period We reviewed her labs in detail Blood in the urine; had CT scan and goes to see the urologist next month; no bright red blood in the urine; has had flank pain on the left; has been to the ER a few times for kidney stones, never saw one pass Numbness in the feet evaluated last visit; normal TSH, B12, normal tests; sharp pains at times; stabbing pain, needles; happens when laying down in the bed; hands might get tingling too No exposure to broken thermometer; no broken new lightbulbs; no hx of exposure to solvents or strong chemicals; 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 Allergic rhinitis 06/30/2016  . Cyst of breast, left, solitary   . Flank pain   . GERD (gastroesophageal reflux disease)     . Hemorrhoid   . IFG (impaired fasting glucose) 07/18/2016   June 2017   . Kidney stones   . Neuropathy ([place]) 07/24/2016  . Seizures ([place])   . Syncope    Recurrent over the last 2 years. had workup at [facility] this year with cardiology and neurology. Had echo and 3-week event monitor that were unremarkable. Has had negative EEG. Echo in 11/11 at [facility] showed no significant abnormalities. Possibly vasovagal  . Trigger thumb    Past Surgical History:  Procedure Laterality Date  . BREAST BIOPSY Left 1980'S   EXCISIONAL - NEG  . CHOLECYSTECTOMY    . FOOT SURGERY    . PARTIAL HYSTERECTOMY     Family History  Problem Relation Age of Onset  . Coronary artery disease Maternal Uncle   . Breast cancer Neg Hx   . Diabetes Mellitus II Mother   . Diabetes Maternal Uncle   . Kidney cancer Father   . Arthritis Mother   . Arthritis Maternal Uncle    Social History  Substance Use Topics  . Smoking status: Never Smoker  . Smokeless tobacco: Never Used  . Alcohol use 0.0 oz/week     Comment: occ   Interim medical history since last visit reviewed. Allergies and medications reviewed  Review of Systems Per HPI  unless specifically indicated above     Objective:    BP 110/78   Pulse 67   Temp 99.1 F (37.3 C) (Oral)   Resp 14   Wt 130 lb (59 kg)   SpO2 95%   BMI 26.26 kg/m   Wt Readings from Last 3 Encounters:  07/24/16 130 lb (59 kg)  06/28/16 131 lb (59.4 kg)  04/19/16 128 lb (58.1 kg)    Physical Exam  Constitutional: She appears well-developed and well-nourished. No distress.  HENT:  Head: Normocephalic and atraumatic.  Eyes: EOM are normal. No scleral icterus.  No ptosis or lid lag, no exophthalmos  Neck: No thyromegaly present.  Cardiovascular: Normal rate, regular rhythm and normal heart sounds.   No murmur heard. Pulmonary/Chest: Effort normal and breath sounds normal. No respiratory distress. She has no wheezes.  Abdominal: Soft. Bowel sounds are normal. She exhibits  no distension.  Musculoskeletal: Normal range of motion. She exhibits no edema.  Neurological: She is alert. She displays no atrophy and no tremor. She exhibits normal muscle tone. She displays no seizure activity.  No tics; intact sensation to monofilament testing hands and feet; grip 5/5, UE and LE 5/5  Skin: Skin is warm and dry. No rash noted. She is not diaphoretic. No pallor.  Areas of what look like slight hyperpigmentation over the face, blotchy  Psychiatric: She has a normal mood and affect. Her behavior is normal. Judgment and thought content normal. Her mood appears not anxious. She does not exhibit a depressed mood.  Very pleasant, cooperative      Assessment & Plan:   Problem List Items Addressed This Visit      Endocrine   IFG (impaired fasting glucose)    Refer to diabetic educator; avoid whites; check A1c every 6 months, next due on or after December 30th      Relevant Orders   Ambulatory referral to diabetic education     Nervous and Auditory   Neuropathy (HCC)    Normal TSH, normal B12, A1c only 5.7; will check heavy metals and UPEP and SPEP; if nothing obvious, refer to neurologist      Relevant Orders   Protein Electrophoresis, (serum) (Completed)   Heavy metals, random urine (Completed)   Protein Electrophoresis,Random Urn (Completed)     Musculoskeletal and Integument   Skin pigmentation disorder   Relevant Orders   Cortisol (Completed)   Ambulatory referral to Dermatology    Other Visit Diagnoses   None.     Follow up plan: Return in about 5 months (around 12/31/2016) for fasting labs and visit for prediabetes.  An after-visit summary was printed and given to the patient at [place].  Please see the patient instructions which may contain other information and recommendations beyond what is mentioned above in the assessment and plan.   Orders Placed This Encounter  Procedures  . Protein Electrophoresis, (serum)  . Heavy metals, random urine  .  Cortisol  . Protein Electrophoresis,Random Urn  . Ambulatory referral to diabetic education  . Ambulatory referral to Dermatology

## 2016-07-24 NOTE — Assessment & Plan Note (Signed)
Refer to diabetic educator; avoid whites; check A1c every 6 months, next due on or after December 30th

## 2016-07-26 ENCOUNTER — Encounter: Payer: Self-pay | Admitting: Family Medicine

## 2016-07-26 ENCOUNTER — Ambulatory Visit: Payer: Medicare Other | Admitting: Family Medicine

## 2016-07-26 DIAGNOSIS — R771 Abnormality of globulin: Secondary | ICD-10-CM | POA: Insufficient documentation

## 2016-07-26 LAB — PROTEIN ELECTROPHORESIS,RANDOM URN
Creatinine, Urine: 69 mg/dL (ref 20–320)
Protein Creatinine Ratio: 72 mg/g creat (ref 21–161)
Total Protein, Urine: 5 mg/dL (ref 5–24)

## 2016-07-26 LAB — PROTEIN ELECTROPHORESIS, SERUM
Albumin ELP: 4.1 g/dL (ref 3.8–4.8)
Alpha-1-Globulin: 0.4 g/dL — ABNORMAL HIGH (ref 0.2–0.3)
Alpha-2-Globulin: 0.7 g/dL (ref 0.5–0.9)
Beta 2: 0.3 g/dL (ref 0.2–0.5)
Beta Globulin: 0.5 g/dL (ref 0.4–0.6)
Gamma Globulin: 0.7 g/dL — ABNORMAL LOW (ref 0.8–1.7)
Total Protein, Serum Electrophoresis: 6.7 g/dL (ref 6.1–8.1)

## 2016-07-31 LAB — HEAVY METALS, RANDOM URINE: Creatinine Random, Urine: 69.3 mg/dL (ref 20.0–320.0)

## 2016-08-08 ENCOUNTER — Ambulatory Visit (INDEPENDENT_AMBULATORY_CARE_PROVIDER_SITE_OTHER): Payer: Medicare Other | Admitting: Urology

## 2016-08-08 ENCOUNTER — Encounter: Payer: Self-pay | Admitting: Urology

## 2016-08-08 VITALS — BP 110/77 | HR 66 | Ht 59.0 in | Wt 133.7 lb

## 2016-08-08 DIAGNOSIS — N3281 Overactive bladder: Secondary | ICD-10-CM

## 2016-08-08 DIAGNOSIS — N200 Calculus of kidney: Secondary | ICD-10-CM | POA: Diagnosis not present

## 2016-08-08 DIAGNOSIS — R3129 Other microscopic hematuria: Secondary | ICD-10-CM | POA: Diagnosis not present

## 2016-08-08 LAB — BLADDER SCAN AMB NON-IMAGING: Scan Result: 41

## 2016-08-08 MED ORDER — MIRABEGRON ER 25 MG PO TB24: 25.0000 mg | ORAL_TABLET | Freq: Every day | ORAL | 11 refills | Status: DC

## 2016-08-08 NOTE — Progress Notes (Signed)
08/08/2016 9:19 AM   Elizabeth Mata December 04, 1961 [id number]  Referring provider: Arnetha Courser, MD [address] [place],  [address]  Chief Complaint  Patient presents with  . New Patient (Initial Visit)    Kidney stone    HPI: The patient is a 55 year old female presents for evaluation of a kidney stone and microscopic hematuria. She recent CT urogram which showed a 3.5 millimeter left mid pole nonobstructing stone and no other GU abnormalities. She states that she's had this stone for a number of years and has never had an issue with it. She also reports urinary frequency. She feels that she voids and then ostomy return to the bathroom. She feels that she empties her bladder. When she has frequent urination she only voids small amount. She denies any bulges in her vagina. She denies leakage of urine. She denies dysuria or UTIs.  PVR:41 cc   PMH: Past Medical History:  Diagnosis Date  . Allergic rhinitis 06/30/2016  . Cyst of breast, left, solitary   . Flank pain   . GERD (gastroesophageal reflux disease)   . Hemorrhoid   . IFG (impaired fasting glucose) 07/18/2016   June 2017   . Kidney stones   . Neuropathy ([place]) 07/24/2016  . Seizures ([place])   . Syncope    Recurrent over the last 2 years. had workup at [facility] this year with cardiology and neurology. Had echo and 3-week event monitor that were unremarkable. Has had negative EEG. Echo in 11/11 at [facility] showed no significant abnormalities. Possibly vasovagal  . Trigger thumb     Surgical History: Past Surgical History:  Procedure Laterality Date  . BREAST BIOPSY Left 1980'S   EXCISIONAL - NEG  . CHOLECYSTECTOMY    . FOOT SURGERY    . PARTIAL HYSTERECTOMY      Home Medications:    Medication List       Accurate as of 08/08/16  9:19 AM. Always use your most recent med list.          levocetirizine 5 MG tablet Commonly known as:  XYZAL Take 1 tablet (5 mg total) by mouth every evening. For  allergies (this replaces the allegra)   mirabegron ER 25 MG Tb24 tablet Commonly known as:  MYRBETRIQ Take 1 tablet (25 mg total) by mouth daily.   topiramate 100 MG tablet Commonly known as:  TOPAMAX Take 150 mg by mouth 2 (two) times daily.       Allergies:  Allergies  Allergen Reactions  . Clindamycin/Lincomycin Hives  . Hydrocodone-Acetaminophen   . Metronidazole   . Sulfa Antibiotics     Family History: Family History  Problem Relation Age of Onset  . Coronary artery disease Maternal Uncle   . Diabetes Mellitus II Mother   . Arthritis Mother   . Diabetes Maternal Uncle   . Kidney cancer Father   . Arthritis Maternal Uncle   . Breast cancer Neg Hx   . Bladder Cancer Neg Hx     Social History:  reports that she has never smoked. She has never used smokeless tobacco. She reports that she drinks alcohol. She reports that she does not use drugs.  ROS: UROLOGY Frequent Urination?: Yes Hard to postpone urination?: No Burning/pain with urination?: No Get up at night to urinate?: No Leakage of urine?: No Urine stream starts and stops?: No Trouble starting stream?: No Do you have to strain to urinate?: No Blood in urine?: Yes Urinary tract infection?: No Sexually transmitted  disease?: No Injury to kidneys or bladder?: No Painful intercourse?: No Weak stream?: No Currently pregnant?: No Vaginal bleeding?: No Last menstrual period?: n  Gastrointestinal Nausea?: No Vomiting?: No Indigestion/heartburn?: No Diarrhea?: No Constipation?: No  Constitutional Fever: No Night sweats?: No Weight loss?: No Fatigue?: No  Skin Skin rash/lesions?: No Itching?: No  Eyes Blurred vision?: No Double vision?: No  Ears/Nose/Throat Sore throat?: No Sinus problems?: No  Hematologic/Lymphatic Swollen glands?: No Easy bruising?: No  Cardiovascular Leg swelling?: No Chest pain?: No  Respiratory Cough?: Yes Shortness of breath?: No  Endocrine Excessive  thirst?: No  Musculoskeletal Back pain?: No Joint pain?: Yes  Neurological Headaches?: No Dizziness?: No  Psychologic Depression?: No Anxiety?: No  Physical Exam: BP 110/77 (BP Location: Left Arm, Patient Position: Sitting, Cuff Size: Normal)   Pulse 66   Ht 4\' 11"  (1.499 m)   Wt 133 lb 11.2 oz (60.6 kg)   BMI 27.00 kg/m   Constitutional:  Alert and oriented, No acute distress. HEENT: [place] AT, moist mucus membranes.  Trachea midline, no masses. Cardiovascular: No clubbing, cyanosis, or edema. Respiratory: Normal respiratory effort, no increased work of breathing. GI: Abdomen is soft, nontender, nondistended, no abdominal masses GU: No CVA tenderness.  Skin: No rashes, bruises or suspicious lesions. Lymph: No cervical or inguinal adenopathy. Neurologic: Grossly intact, no focal deficits, moving all 4 extremities. Psychiatric: Normal mood and affect.  Laboratory Data: Lab Results  Component Value Date   WBC 5.3 06/28/2016   HGB 13.8 06/28/2016   HCT 40.6 06/28/2016   MCV 92.1 06/28/2016   PLT 323 06/28/2016    Lab Results  Component Value Date   CREATININE 0.82 06/28/2016    No results found for: PSA  No results found for: TESTOSTERONE  Lab Results  Component Value Date   HGBA1C 5.7 (H) 06/28/2016    Urinalysis    Component Value Date/Time   COLORURINE YELLOW 06/28/2016 1500   APPEARANCEUR CLEAR 06/28/2016 1500   APPEARANCEUR Clear 10/24/2015 1645   LABSPEC 1.009 06/28/2016 1500   LABSPEC 1.014 10/30/2014 1914   PHURINE 7.5 06/28/2016 1500   GLUCOSEU NEGATIVE 06/28/2016 1500   GLUCOSEU Negative 10/30/2014 1914   HGBUR 1+ (A) 06/28/2016 1500   BILIRUBINUR NEGATIVE 06/28/2016 1500   BILIRUBINUR Negative 10/24/2015 1645   BILIRUBINUR Negative 10/30/2014 1914   KETONESUR NEGATIVE 06/28/2016 1500   PROTEINUR NEGATIVE 06/28/2016 1500   UROBILINOGEN 0.2 10/24/2015 1450   UROBILINOGEN 0.2 11/20/2008 1126   NITRITE NEGATIVE 06/28/2016 1500   LEUKOCYTESUR  NEGATIVE 06/28/2016 1500   LEUKOCYTESUR 2+ (A) 10/24/2015 1645   LEUKOCYTESUR Negative 10/30/2014 1914    Pertinent Imaging: CLINICAL DATA:  Hematuria.  Back pain.  Nephrolithiasis.  EXAM: CT ABDOMEN AND PELVIS WITHOUT AND WITH CONTRAST  TECHNIQUE: Multidetector CT imaging of the abdomen and pelvis was performed following the standard protocol before and following the bolus administration of intravenous contrast.  CONTRAST:  176mL ISOVUE-300 IOPAMIDOL (ISOVUE-300) INJECTION 61%  COMPARISON:  Noncontrast CT on 10/24/2015  FINDINGS: Lower chest:  No acute findings.  Hepatobiliary: Tiny right hepatic lobe cyst remains stable. No liver masses are identified. Prior cholecystectomy noted. No evidence of biliary dilatation.  Pancreas: No mass, inflammatory changes, or other significant abnormality.  Spleen: Within normal limits in size and appearance.  Adrenals/Urinary Tract: Normal adrenal glands. A 4 mm nonobstructive calculus is again seen in the midpole of the left kidney. No evidence of hydronephrosis. No evidence of ureteral or bladder calculi. No evidence of renal masses. No masses  are seen involving the renal collecting systems or lower urinary tract.  Stomach/Bowel: No evidence of obstruction, inflammatory process, or abnormal fluid collections. Normal appendix visualized.  Vascular/Lymphatic: No pathologically enlarged lymph nodes. No evidence of abdominal aortic aneurysm.  Reproductive: No mass or other significant abnormality.  Other: None.  Musculoskeletal:  No suspicious bone lesions identified.  IMPRESSION: 4 mm nonobstructive left renal calculus again noted. No evidence of ureteral calculi, hydronephrosis, or other acute findings.  No radiographic evidence of urinary tract carcinoma.  Assessment & Plan:    1. Left nephrolithiasis -active surveillance for now  2. Microscopic hematuria -needs cystoscopy to complete work up  3.  Overactive bladder -will start Myrbetriq 25 mg daily. Samples given -will need pelvic exam during cystoscopy as well   Return in about 2 weeks (around 08/22/2016) for cystoscopy/pelvic exam.  Nickie Retort, [facility] [address] [place], [place] [address] [phone]

## 2016-08-12 ENCOUNTER — Other Ambulatory Visit: Payer: Self-pay | Admitting: Family Medicine

## 2016-08-12 ENCOUNTER — Ambulatory Visit
Admission: RE | Admit: 2016-08-12 | Discharge: 2016-08-12 | Disposition: A | Discharge status: Home / Self Care | Payer: Medicare Other | Source: Ambulatory Visit | Attending: Family Medicine | Admitting: Family Medicine

## 2016-08-12 DIAGNOSIS — Z1231 Encounter for screening mammogram for malignant neoplasm of breast: Secondary | ICD-10-CM | POA: Insufficient documentation

## 2016-08-22 ENCOUNTER — Encounter: Payer: Self-pay | Admitting: Urology

## 2016-08-22 ENCOUNTER — Ambulatory Visit (INDEPENDENT_AMBULATORY_CARE_PROVIDER_SITE_OTHER): Payer: Medicare Other | Admitting: Urology

## 2016-08-22 DIAGNOSIS — N3281 Overactive bladder: Secondary | ICD-10-CM

## 2016-08-22 DIAGNOSIS — R3129 Other microscopic hematuria: Secondary | ICD-10-CM | POA: Diagnosis not present

## 2016-08-22 DIAGNOSIS — N200 Calculus of kidney: Secondary | ICD-10-CM | POA: Diagnosis not present

## 2016-08-22 LAB — URINALYSIS, COMPLETE
Bilirubin, UA: NEGATIVE
Glucose, UA: NEGATIVE
Ketones, UA: NEGATIVE
Leukocytes, UA: NEGATIVE
Nitrite, UA: NEGATIVE
Protein, UA: NEGATIVE
Specific Gravity, UA: 1.02 (ref 1.005–1.030)
Urobilinogen, Ur: 0.2 mg/dL (ref 0.2–1.0)
pH, UA: 6 (ref 5.0–7.5)

## 2016-08-22 LAB — MICROSCOPIC EXAMINATION
Bacteria, UA: NONE SEEN
WBC, UA: NONE SEEN /hpf (ref 0–?)

## 2016-08-22 MED ORDER — LIDOCAINE HCL 2 % EX GEL
1.0000 "application " | Freq: Once | CUTANEOUS | Status: AC
  Administered 2016-08-22: 1 via URETHRAL

## 2016-08-22 MED ORDER — CIPROFLOXACIN HCL 500 MG PO TABS
500.0000 mg | ORAL_TABLET | Freq: Once | ORAL | Status: AC
  Administered 2016-08-22: 500 mg via ORAL

## 2016-08-22 NOTE — Progress Notes (Signed)
08/22/2016 11:47 AM   Elizabeth Mata 1961-08-06 [id number]  Referring provider: Arnetha Courser, MD [address] [place], [place] [address]  Chief Complaint  Patient presents with  . Cysto    microscopic hematuria    HPI: The patient is a 55 year old female presents for evaluation of a kidney stone and microscopic hematuria. She recent CT urogram which showed a 3.5 millimeter left mid pole nonobstructing stone and no other GU abnormalities. She states that she's had this stone for a number of years and has never had an issue with it. She also reports urinary frequency. She feels that she voids and then immediately returns to the bathroom. She feels that she empties her bladder. When she has frequent urination she only voids small amount. She denies any bulges in her vagina. She denies leakage of urine. She denies dysuria or UTIs.  PVR:41 cc  She is due for cystoscopy/pelvic exam today.   PMH: Past Medical History:  Diagnosis Date  . Allergic rhinitis 06/30/2016  . Cyst of breast, left, solitary   . Flank pain   . GERD (gastroesophageal reflux disease)   . Hemorrhoid   . IFG (impaired fasting glucose) 07/18/2016   June 2017   . Kidney stones   . Neuropathy ([place]) 07/24/2016  . Seizures ([place])   . Syncope    Recurrent over the last 2 years. had workup at [facility] this year with cardiology and neurology. Had echo and 3-week event monitor that were unremarkable. Has had negative EEG. Echo in 11/11 at [facility] showed no significant abnormalities. Possibly vasovagal  . Trigger thumb     Surgical History: Past Surgical History:  Procedure Laterality Date  . BREAST BIOPSY Left 1980'S   EXCISIONAL - NEG  . CHOLECYSTECTOMY    . FOOT SURGERY    . PARTIAL HYSTERECTOMY      Home Medications:    Medication List       Accurate as of 08/22/16 11:47 AM. Always use your most recent med list.          levocetirizine 5 MG tablet Commonly known as:  XYZAL Take 1 tablet  (5 mg total) by mouth every evening. For allergies (this replaces the allegra)   mirabegron ER 25 MG Tb24 tablet Commonly known as:  MYRBETRIQ Take 1 tablet (25 mg total) by mouth daily.   topiramate 100 MG tablet Commonly known as:  TOPAMAX Take 150 mg by mouth 2 (two) times daily.       Allergies:  Allergies  Allergen Reactions  . Clindamycin/Lincomycin Hives  . Hydrocodone-Acetaminophen   . Metronidazole   . Sulfa Antibiotics     Family History: Family History  Problem Relation Age of Onset  . Coronary artery disease Maternal Uncle   . Diabetes Mellitus II Mother   . Arthritis Mother   . Diabetes Maternal Uncle   . Kidney cancer Father   . Arthritis Maternal Uncle   . Breast cancer Neg Hx   . Bladder Cancer Neg Hx     Social History:  reports that she has never smoked. She has never used smokeless tobacco. She reports that she drinks alcohol. She reports that she does not use drugs.  ROS:                                        Physical Exam: There were no vitals taken for this  visit.  Constitutional:  Alert and oriented, No acute distress. HEENT: [place] AT, moist mucus membranes.  Trachea midline, no masses. Cardiovascular: No clubbing, cyanosis, or edema. Respiratory: Normal respiratory effort, no increased work of breathing. GI: Abdomen is soft, nontender, nondistended, no abdominal masses GU: No CVA tenderness.  Skin: No rashes, bruises or suspicious lesions. Lymph: No cervical or inguinal adenopathy. Neurologic: Grossly intact, no focal deficits, moving all 4 extremities. Psychiatric: Normal mood and affect.  Laboratory Data: Lab Results  Component Value Date   WBC 5.3 06/28/2016   HGB 13.8 06/28/2016   HCT 40.6 06/28/2016   MCV 92.1 06/28/2016   PLT 323 06/28/2016    Lab Results  Component Value Date   CREATININE 0.82 06/28/2016    No results found for: PSA  No results found for: TESTOSTERONE  Lab Results  Component  Value Date   HGBA1C 5.7 (H) 06/28/2016    Urinalysis    Component Value Date/Time   COLORURINE YELLOW 06/28/2016 1500   APPEARANCEUR CLEAR 06/28/2016 1500   APPEARANCEUR Clear 10/24/2015 1645   LABSPEC 1.009 06/28/2016 1500   LABSPEC 1.014 10/30/2014 1914   PHURINE 7.5 06/28/2016 1500   GLUCOSEU NEGATIVE 06/28/2016 1500   GLUCOSEU Negative 10/30/2014 1914   HGBUR 1+ (A) 06/28/2016 1500   BILIRUBINUR NEGATIVE 06/28/2016 1500   BILIRUBINUR Negative 10/24/2015 1645   BILIRUBINUR Negative 10/30/2014 1914   KETONESUR NEGATIVE 06/28/2016 1500   PROTEINUR NEGATIVE 06/28/2016 1500   UROBILINOGEN 0.2 10/24/2015 1450   UROBILINOGEN 0.2 11/20/2008 1126   NITRITE NEGATIVE 06/28/2016 1500   LEUKOCYTESUR NEGATIVE 06/28/2016 1500   LEUKOCYTESUR 2+ (A) 10/24/2015 1645   LEUKOCYTESUR Negative 10/30/2014 1914    Pertinent Imaging: CLINICAL DATA: Hematuria. Back pain. Nephrolithiasis.  EXAM: CT ABDOMEN AND PELVIS WITHOUT AND WITH CONTRAST  TECHNIQUE: Multidetector CT imaging of the abdomen and pelvis was performed following the standard protocol before and following the bolus administration of intravenous contrast.  CONTRAST: 154mL ISOVUE-300 IOPAMIDOL (ISOVUE-300) INJECTION 61%  COMPARISON: Noncontrast CT on 10/24/2015  FINDINGS: Lower chest: No acute findings.  Hepatobiliary: Tiny right hepatic lobe cyst remains stable. No liver masses are identified. Prior cholecystectomy noted. No evidence of biliary dilatation.  Pancreas: No mass, inflammatory changes, or other significant abnormality.  Spleen: Within normal limits in size and appearance.  Adrenals/Urinary Tract: Normal adrenal glands. A 4 mm nonobstructive calculus is again seen in the midpole of the left kidney. No evidence of hydronephrosis. No evidence of ureteral or bladder calculi. No evidence of renal masses. No masses are seen involving the renal collecting systems or lower urinary  tract.  Stomach/Bowel: No evidence of obstruction, inflammatory process, or abnormal fluid collections. Normal appendix visualized.  Vascular/Lymphatic: No pathologically enlarged lymph nodes. No evidence of abdominal aortic aneurysm.  Reproductive: No mass or other significant abnormality.  Other: None.  Musculoskeletal: No suspicious bone lesions identified.  IMPRESSION: 4 mm nonobstructive left renal calculus again noted. No evidence of ureteral calculi, hydronephrosis, or other acute findings.  No radiographic evidence of urinary tract carcinoma.   Cystoscopy Procedure Note  Patient identification was confirmed, informed consent was obtained, and patient was prepped using Betadine solution.  Lidocaine jelly was administered per urethral meatus.    Preoperative abx where received prior to procedure.    Procedure: - Flexible cystoscope introduced, without any difficulty.   - Thorough search of the bladder revealed:    normal urethral meatus    normal urothelium    no stones    no ulcers  no tumors    no urethral polyps    no trabeculation  - Ureteral orifices were normal in position and appearance.  Post-Procedure: - Patient tolerated the procedure well   Assessment & Plan:    1. Left nephrolithiasis -active surveillance for now. Repeat KUB in one year.  2. Microscopic hematuria -negative work up except for above. Repeat urinalysis in one year  3. Overactive bladder -continue Myrbetriq 25 mg daily  Return in about 1 year (around 08/22/2017) for with KUB prior.  Nickie Retort, MD  [facility] [address] [place], [place] [address] [phone]

## 2016-09-08 ENCOUNTER — Encounter: Payer: Self-pay | Admitting: Emergency Medicine

## 2016-09-08 ENCOUNTER — Ambulatory Visit
Admission: EM | Admit: 2016-09-08 | Discharge: 2016-09-08 | Disposition: A | Discharge status: Home / Self Care | Payer: Medicare Other | Attending: Family Medicine | Admitting: Family Medicine

## 2016-09-08 DIAGNOSIS — A499 Bacterial infection, unspecified: Secondary | ICD-10-CM | POA: Diagnosis not present

## 2016-09-08 DIAGNOSIS — Z833 Family history of diabetes mellitus: Secondary | ICD-10-CM | POA: Diagnosis not present

## 2016-09-08 DIAGNOSIS — Z87442 Personal history of urinary calculi: Secondary | ICD-10-CM | POA: Insufficient documentation

## 2016-09-08 DIAGNOSIS — Z881 Allergy status to other antibiotic agents status: Secondary | ICD-10-CM | POA: Diagnosis not present

## 2016-09-08 DIAGNOSIS — Z882 Allergy status to sulfonamides status: Secondary | ICD-10-CM | POA: Insufficient documentation

## 2016-09-08 DIAGNOSIS — N760 Acute vaginitis: Secondary | ICD-10-CM

## 2016-09-08 DIAGNOSIS — B9689 Other specified bacterial agents as the cause of diseases classified elsewhere: Secondary | ICD-10-CM | POA: Insufficient documentation

## 2016-09-08 DIAGNOSIS — R300 Dysuria: Secondary | ICD-10-CM | POA: Diagnosis present

## 2016-09-08 DIAGNOSIS — Z885 Allergy status to narcotic agent status: Secondary | ICD-10-CM | POA: Diagnosis not present

## 2016-09-08 LAB — URINALYSIS COMPLETE WITH MICROSCOPIC (ARMC ONLY)
Bilirubin Urine: NEGATIVE
Glucose, UA: NEGATIVE mg/dL
Ketones, ur: NEGATIVE mg/dL
Leukocytes, UA: NEGATIVE
Nitrite: NEGATIVE
Protein, ur: NEGATIVE mg/dL
Specific Gravity, Urine: 1.01 (ref 1.005–1.030)
pH: 7 (ref 5.0–8.0)

## 2016-09-08 LAB — CHLAMYDIA/NGC RT PCR (ARMC ONLY)
Chlamydia Tr: NOT DETECTED
N gonorrhoeae: NOT DETECTED

## 2016-09-08 LAB — WET PREP, GENITAL
Sperm: NONE SEEN
Trich, Wet Prep: NONE SEEN
Yeast Wet Prep HPF POC: NONE SEEN

## 2016-09-08 MED ORDER — METRONIDAZOLE 500 MG PO TABS
500.0000 mg | ORAL_TABLET | Freq: Two times a day (BID) | ORAL | 0 refills | Status: DC
Start: 2016-09-08 — End: 2016-12-20

## 2016-09-08 NOTE — ED Triage Notes (Signed)
Patient c/o burning when urinating and increase in urinary frequency that started 2 weeks ago.

## 2016-09-08 NOTE — ED Provider Notes (Signed)
CSN: [id number]     Arrival date & time 09/08/16  1152 History   First MD Initiated Contact with Patient 09/08/16 1303     Chief Complaint  Patient presents with  . Dysuria   (Consider location/radiation/quality/duration/timing/severity/associated sxs/prior Treatment) HPI  This a 55 year old female who presents with dysuria and urinary increased frequency for about a month. She is also noticed a malodorous discharge. She is sexually active with a single partner but is unsure of his fidelity. He has a history of microscopic hematuria which is being followed by [facility] urological as well as a left renal stone has been stable over several years. Denies any fever chills has had no back pain. She has been douching in an effort to decrease the malodorous discharge      Past Medical History:  Diagnosis Date  . Allergic rhinitis 06/30/2016  . Cyst of breast, left, solitary   . Flank pain   . GERD (gastroesophageal reflux disease)   . Hemorrhoid   . IFG (impaired fasting glucose) 07/18/2016   June 2017   . Kidney stones   . Neuropathy ([place]) 07/24/2016  . Seizures ([place])   . Syncope    Recurrent over the last 2 years. had workup at [facility] this year with cardiology and neurology. Had echo and 3-week event monitor that were unremarkable. Has had negative EEG. Echo in 11/11 at [facility] showed no significant abnormalities. Possibly vasovagal  . Trigger thumb    Past Surgical History:  Procedure Laterality Date  . ABDOMINAL HYSTERECTOMY    . BREAST BIOPSY Left 1980'S   EXCISIONAL - NEG  . CHOLECYSTECTOMY    . FOOT SURGERY    . PARTIAL HYSTERECTOMY     Family History  Problem Relation Age of Onset  . Coronary artery disease Maternal Uncle   . Diabetes Mellitus II Mother   . Arthritis Mother   . Diabetes Maternal Uncle   . Kidney cancer Father   . Arthritis Maternal Uncle   . Breast cancer Neg Hx   . Bladder Cancer Neg Hx    Social History  Substance Use Topics  . Smoking status: Never  Smoker  . Smokeless tobacco: Never Used  . Alcohol use 0.0 oz/week     Comment: occ   OB History    No data available     Review of Systems  Constitutional: Negative for chills, fatigue and fever.  Genitourinary: Positive for difficulty urinating, dysuria, frequency, urgency and vaginal discharge.  All other systems reviewed and are negative.   Allergies  Clindamycin/lincomycin; Hydrocodone-acetaminophen; Metronidazole; and Sulfa antibiotics  Home Medications   Prior to Admission medications   Medication Sig Start Date End Date Taking? Authorizing Provider  levocetirizine (XYZAL) 5 MG tablet Take 1 tablet (5 mg total) by mouth every evening. For allergies (this replaces the allegra) 06/28/16   Arnetha Courser, MD  metroNIDAZOLE (FLAGYL) 500 MG tablet Take 1 tablet (500 mg total) by mouth 2 (two) times daily. 09/08/16   Lorin Picket, PA-C  mirabegron ER (MYRBETRIQ) 25 MG TB24 tablet Take 1 tablet (25 mg total) by mouth daily. 08/08/16   Nickie Retort, MD  topiramate (TOPAMAX) 100 MG tablet Take 150 mg by mouth 2 (two) times daily.    Historical Provider, MD   Meds Ordered and Administered this Visit  Medications - No data to display  BP 106/76 (BP Location: Left Arm)   Pulse 74   Temp 98.2 F (36.8 C) (Oral)   Resp 16  Ht 4\' 11"  (1.499 m)   Wt 130 lb (59 kg)   SpO2 100%   BMI 26.26 kg/m  No data found.   Physical Exam  Constitutional: She appears well-developed and well-nourished. No distress.  HENT:  Head: Normocephalic and atraumatic.  Eyes: EOM are normal. Pupils are equal, round, and reactive to light.  Neck: Normal range of motion. Neck supple.  Genitourinary: Vaginal discharge found.  Genitourinary Comments: Examination was performed with Nira Conn, RN as Education officer, museum. External genitalia was normal. Urethra is normal with no friability present. There is a very small amount of whitish discharge at the introitus. Speculum exam was then performed.  Had a moderate white to gray discharge present. Samples for wet prep and GC/ chlamydia were obtained and submitted to the laboratory. Bimanual examination was then performed with no adnexal tenderness present. Cervix was absent from partial hysterectomy.  Skin: She is not diaphoretic.  Nursing note and vitals reviewed.   Urgent Care Course   Clinical Course    Procedures (including critical care time)  Labs Review Labs Reviewed  WET PREP, GENITAL - Abnormal; Notable for the following:       Result Value   Clue Cells Wet Prep HPF POC PRESENT (*)    WBC, Wet Prep HPF POC MODERATE (*)    All other components within normal limits  URINALYSIS COMPLETEWITH MICROSCOPIC ([facility] ONLY) - Abnormal; Notable for the following:    Hgb urine dipstick MODERATE (*)    Bacteria, UA FEW (*)    Squamous Epithelial / LPF 0-5 (*)    All other components within normal limits  CHLAMYDIA/NGC RT PCR ([facility] ONLY)    Imaging Review No results found.   Visual Acuity Review  Right Eye Distance:   Left Eye Distance:   Bilateral Distance:    Right Eye Near:   Left Eye Near:    Bilateral Near:         MDM   1. BV (bacterial vaginosis)    Discharge Medication List as of 09/08/2016  1:52 PM    START taking these medications   Details  metroNIDAZOLE (FLAGYL) 500 MG tablet Take 1 tablet (500 mg total) by mouth 2 (two) times daily., Starting Sun 09/08/2016, Normal      Plan: 1. Test/x-ray results and diagnosis reviewed with patient 2. rx as per orders; risks, benefits, potential side effects reviewed with patient 3. Recommend supportive treatment with stop douching. Refrain from sexual activity while on the Flagyl. Do not drink alcohol while on Flagyl. If she is not improving or worsening should follow-up at our clinic or with her primary care physician 4. F/u prn if symptoms worsen or don't improve     Lorin Picket, PA-C 09/08/16 1356

## 2016-09-19 ENCOUNTER — Telehealth: Payer: Self-pay | Admitting: Family Medicine

## 2016-09-19 MED ORDER — FLUCONAZOLE 150 MG PO TABS: 150.0000 mg | ORAL_TABLET | Freq: Once | ORAL | 0 refills | Status: AC

## 2016-09-19 NOTE — Telephone Encounter (Signed)
sent 

## 2016-10-08 ENCOUNTER — Ambulatory Visit (INDEPENDENT_AMBULATORY_CARE_PROVIDER_SITE_OTHER): Payer: Medicare Other

## 2016-10-08 DIAGNOSIS — Z23 Encounter for immunization: Secondary | ICD-10-CM | POA: Diagnosis not present

## 2016-10-28 ENCOUNTER — Ambulatory Visit: Payer: Medicare Other | Admitting: Family Medicine

## 2016-11-11 DIAGNOSIS — L811 Chloasma: Secondary | ICD-10-CM | POA: Diagnosis not present

## 2016-11-11 DIAGNOSIS — L821 Other seborrheic keratosis: Secondary | ICD-10-CM | POA: Diagnosis not present

## 2016-11-11 DIAGNOSIS — L812 Freckles: Secondary | ICD-10-CM | POA: Diagnosis not present

## 2016-11-20 ENCOUNTER — Encounter: Payer: Self-pay | Admitting: Family Medicine

## 2016-11-20 DIAGNOSIS — G629 Polyneuropathy, unspecified: Secondary | ICD-10-CM | POA: Diagnosis not present

## 2016-11-20 DIAGNOSIS — G43109 Migraine with aura, not intractable, without status migrainosus: Secondary | ICD-10-CM | POA: Diagnosis not present

## 2016-11-20 DIAGNOSIS — G5603 Carpal tunnel syndrome, bilateral upper limbs: Secondary | ICD-10-CM | POA: Diagnosis not present

## 2016-12-20 ENCOUNTER — Ambulatory Visit (INDEPENDENT_AMBULATORY_CARE_PROVIDER_SITE_OTHER): Payer: Medicare Other | Admitting: Family Medicine

## 2016-12-20 ENCOUNTER — Encounter: Payer: Self-pay | Admitting: Family Medicine

## 2016-12-20 DIAGNOSIS — R7301 Impaired fasting glucose: Secondary | ICD-10-CM | POA: Diagnosis not present

## 2016-12-20 DIAGNOSIS — J3081 Allergic rhinitis due to animal (cat) (dog) hair and dander: Secondary | ICD-10-CM | POA: Diagnosis not present

## 2016-12-20 DIAGNOSIS — Z5181 Encounter for therapeutic drug level monitoring: Secondary | ICD-10-CM | POA: Insufficient documentation

## 2016-12-20 DIAGNOSIS — I6529 Occlusion and stenosis of unspecified carotid artery: Secondary | ICD-10-CM

## 2016-12-20 DIAGNOSIS — G43109 Migraine with aura, not intractable, without status migrainosus: Secondary | ICD-10-CM | POA: Diagnosis not present

## 2016-12-20 DIAGNOSIS — R569 Unspecified convulsions: Secondary | ICD-10-CM

## 2016-12-20 DIAGNOSIS — R635 Abnormal weight gain: Secondary | ICD-10-CM

## 2016-12-20 DIAGNOSIS — Z8489 Family history of other specified conditions: Secondary | ICD-10-CM | POA: Diagnosis not present

## 2016-12-20 DIAGNOSIS — N200 Calculus of kidney: Secondary | ICD-10-CM

## 2016-12-20 LAB — COMPLETE METABOLIC PANEL WITH GFR
ALT: 17 U/L (ref 6–29)
AST: 20 U/L (ref 10–35)
Albumin: 4 g/dL (ref 3.6–5.1)
Alkaline Phosphatase: 64 U/L (ref 33–130)
BUN: 16 mg/dL (ref 7–25)
CO2: 22 mmol/L (ref 20–31)
Calcium: 9.4 mg/dL (ref 8.6–10.4)
Chloride: 111 mmol/L — ABNORMAL HIGH (ref 98–110)
Creat: 0.85 mg/dL (ref 0.50–1.05)
GFR, Est African American: >89 mL/min (ref >=60)
GFR, Est Non African American: 78 mL/min (ref >=60)
Glucose, Bld: 88 mg/dL (ref 65–99)
Potassium: 4.5 mmol/L (ref 3.5–5.3)
Sodium: 142 mmol/L (ref 135–146)
Total Bilirubin: 0.5 mg/dL (ref 0.2–1.2)
Total Protein: 6.5 g/dL (ref 6.1–8.1)

## 2016-12-20 LAB — LIPID PANEL
Cholesterol: 263 mg/dL — ABNORMAL HIGH (ref <200)
HDL: 116 mg/dL (ref >50)
LDL Cholesterol: 135 mg/dL — ABNORMAL HIGH (ref <100)
Total CHOL/HDL Ratio: 2.3 Ratio (ref <5.0)
Triglycerides: 60 mg/dL (ref <150)
VLDL: 12 mg/dL (ref <30)

## 2016-12-20 LAB — TSH: TSH: 1.34 mIU/L

## 2016-12-20 LAB — HEMOGLOBIN A1C
Hgb A1c MFr Bld: 5.4 % (ref <5.7)
Mean Plasma Glucose: 108 mg/dL

## 2016-12-20 MED ORDER — FLUOXETINE HCL 20 MG PO TABS: ORAL_TABLET | ORAL | 0 refills | Status: DC

## 2016-12-20 NOTE — Assessment & Plan Note (Signed)
Check labs 

## 2016-12-20 NOTE — Assessment & Plan Note (Signed)
Managed by urologist; staying hydrated

## 2016-12-20 NOTE — Patient Instructions (Signed)
Start the new medicine Call us in 3-4 weeks with an update on the new medicine, sooner if any problems

## 2016-12-20 NOTE — Assessment & Plan Note (Signed)
Monitored by neurologist; check lipids

## 2016-12-20 NOTE — Assessment & Plan Note (Signed)
Anxiety about family member's health; will start fluoxetine 10 mg daily x 6 days, then 20 mg daily; hopefully will curb some of her stress eating; call me with update in 3-4 weeks; she agrees

## 2016-12-20 NOTE — Progress Notes (Signed)
BP 106/74   Pulse 82   Temp 98.3 F (36.8 C)   Resp 14   Wt 137 lb 3 oz (62.2 kg)   SpO2 98%   BMI 27.71 kg/m    Subjective:    Patient ID: Elizabeth Mata, female    DOB: 06-08-1961, 55 y.o.   MRN: [id number]  HPI: Elizabeth Mata is a 55 y.o. female  Chief Complaint  Patient presents with  . Follow-up  . Diabetes   Impaired fasting glucose; last a1c was 5.7 in June; no dry mouth; drinking lots of water  She is seeing neurologist for [place]'s migraine, basilar artery migraines; she is on topamax; stress can bring it on  She has seen urologist for blood in the urine; found to have kidney stones, knew that all along on; negative work-up; just stone on the left; staying hydrated  She has gained about 7 pounds over the last few months; eating sweets; not doing any regular walking, "with the weather and everything"; does have an exercise bike but craving sweets a lot; she does have the will power she says when I asked; father has had kidney cancer and his health deteriorated in October  Depression screen [facility] 2/9 12/20/2016 06/28/2016 12/27/2015 09/13/2015  Decreased Interest 0 0 0 0  Down, Depressed, Hopeless 0 0 0 0  PHQ - 2 Score 0 0 0 0   Relevant past medical, surgical, family and social history reviewed Past Medical History:  Diagnosis Date  . Allergic rhinitis 06/30/2016  . Cyst of breast, left, solitary   . Flank pain   . GERD (gastroesophageal reflux disease)   . Hemorrhoid   . IFG (impaired fasting glucose) 07/18/2016   June 2017   . Kidney stones   . Neuropathy ([place]) 07/24/2016  . Seizures ([place])   . Syncope    Recurrent over the last 2 years. had workup at [facility] this year with cardiology and neurology. Had echo and 3-week event monitor that were unremarkable. Has had negative EEG. Echo in 11/11 at [facility] showed no significant abnormalities. Possibly vasovagal  . Trigger thumb    Past Surgical History:  Procedure Laterality Date  . ABDOMINAL  HYSTERECTOMY    . BREAST BIOPSY Left 1980'S   EXCISIONAL - NEG  . CHOLECYSTECTOMY    . FOOT SURGERY    . PARTIAL HYSTERECTOMY     Family History  Problem Relation Age of Onset  . Coronary artery disease Maternal Uncle   . Diabetes Mellitus II Mother   . Arthritis Mother   . Diabetes Maternal Uncle   . Kidney cancer Father   . Arthritis Maternal Uncle   . Breast cancer Neg Hx   . Bladder Cancer Neg Hx    Social History  Substance Use Topics  . Smoking status: Never Smoker  . Smokeless tobacco: Never Used  . Alcohol use 0.0 oz/week     Comment: occ   Interim medical history since last visit reviewed. Allergies and medications reviewed  Review of Systems Per HPI unless specifically indicated above     Objective:    BP 106/74   Pulse 82   Temp 98.3 F (36.8 C)   Resp 14   Wt 137 lb 3 oz (62.2 kg)   SpO2 98%   BMI 27.71 kg/m   Wt Readings from Last 3 Encounters:  12/20/16 137 lb 3 oz (62.2 kg)  09/08/16 130 lb (59 kg)  08/22/16 (P) 134 lb 11.2 oz (61.1 kg)  Physical Exam  Constitutional: She appears well-developed and well-nourished. No distress.  HENT:  Head: Normocephalic and atraumatic.  Eyes: EOM are normal. No scleral icterus.  No ptosis or lid lag, no exophthalmos  Neck: No thyromegaly present.  Cardiovascular: Normal rate, regular rhythm and normal heart sounds.   No murmur heard. Pulmonary/Chest: Effort normal and breath sounds normal. No respiratory distress. She has no wheezes.  Abdominal: She exhibits no distension.  Musculoskeletal: Normal range of motion. She exhibits no edema.  Neurological: She is alert. She displays no atrophy and no tremor. She displays no seizure activity.  Reflex Scores:      Patellar reflexes are 1+ on the right side and 1+ on the left side. No tics; grip 5/5  Skin: Skin is warm and dry. No rash noted. She is not diaphoretic. No pallor.  Psychiatric: Her behavior is normal. Judgment and thought content normal. Her mood  appears anxious. She does not exhibit a depressed mood.  Very pleasant, cooperative   Results for orders placed or performed during the hospital encounter of 09/08/16  [place] rt PCR  Result Value Ref Range   Specimen source GC/Chlam ENDOCERVICAL    Chlamydia Tr NOT DETECTED NOT DETECTED   N gonorrhoeae NOT DETECTED NOT DETECTED  Wet prep, genital  Result Value Ref Range   Yeast Wet Prep HPF POC NONE SEEN NONE SEEN   Trich, Wet Prep NONE SEEN NONE SEEN   Clue Cells Wet Prep HPF POC PRESENT (A) NONE SEEN   WBC, Wet Prep HPF POC MODERATE (A) NONE SEEN   Sperm NONE SEEN   Urinalysis complete, with microscopic  Result Value Ref Range   Color, Urine YELLOW YELLOW   APPearance CLEAR CLEAR   Glucose, UA NEGATIVE NEGATIVE mg/dL   Bilirubin Urine NEGATIVE NEGATIVE   Ketones, ur NEGATIVE NEGATIVE mg/dL   Specific Gravity, Urine 1.010 1.005 - 1.030   Hgb urine dipstick MODERATE (A) NEGATIVE   pH 7.0 5.0 - 8.0   Protein, ur NEGATIVE NEGATIVE mg/dL   Nitrite NEGATIVE NEGATIVE   Leukocytes, UA NEGATIVE NEGATIVE   RBC / HPF 6-30 0 - 5 RBC/hpf   WBC, UA 0-5 0 - 5 WBC/hpf   Bacteria, UA FEW (A) NONE SEEN   Squamous Epithelial / LPF 0-5 (A) NONE SEEN   Ca Oxalate Crys, UA PRESENT       Assessment & Plan:   Problem List Items Addressed This Visit      Cardiovascular and Mediastinum   Carotid artery narrowing    Monitored by neurologist; check lipids      Relevant Orders   Lipid panel   Basilar artery migraine    Managed by neurologist      Relevant Medications   FLUoxetine (PROZAC) 20 MG tablet     Respiratory   Allergic rhinitis    Taking xyzal; animal hair and ragweed and dust, year-round        Endocrine   IFG (impaired fasting glucose)    Check A1c; limit sweets      Relevant Orders   Hemoglobin A1c     Genitourinary   Kidney stone on left side    Managed by urologist; staying hydrated        Other   Weight gain, abnormal    Check TSH       Relevant Orders   TSH   Seizures ([place])    Managed by neurologist      Medication monitoring encounter    Check labs  Relevant Orders   COMPLETE METABOLIC PANEL WITH GFR   Family health problem    Anxiety about family member's health; will start fluoxetine 10 mg daily x 6 days, then 20 mg daily; hopefully will curb some of her stress eating; call me with update in 3-4 weeks; she agrees         Follow up plan: Return in about 6 months (around 06/20/2017) for follow-up, but I'm here sooner if you need me.  An after-visit summary was printed and given to the patient at [place].  Please see the patient instructions which may contain other information and recommendations beyond what is mentioned above in the assessment and plan.  Meds ordered this encounter  Medications  . FLUoxetine (PROZAC) 20 MG tablet    Sig: One-half of a pill by mouth daily x 6 days, then one whole pill daily    Dispense:  30 tablet    Refill:  0   Orders Placed This Encounter  Procedures  . COMPLETE METABOLIC PANEL WITH GFR  . Hemoglobin A1c  . Lipid panel  . TSH

## 2016-12-20 NOTE — Assessment & Plan Note (Signed)
Check TSH 

## 2016-12-20 NOTE — Assessment & Plan Note (Signed)
Managed by neurologist

## 2016-12-20 NOTE — Assessment & Plan Note (Addendum)
Taking xyzal; animal hair and ragweed and dust, year-round

## 2016-12-20 NOTE — Assessment & Plan Note (Signed)
Check A1c; limit sweets 

## 2016-12-24 ENCOUNTER — Telehealth: Payer: Self-pay | Admitting: Family Medicine

## 2016-12-24 NOTE — Telephone Encounter (Signed)
Please call pt about her results. She returned your call

## 2017-01-16 ENCOUNTER — Other Ambulatory Visit: Payer: Self-pay | Admitting: Family Medicine

## 2017-02-03 ENCOUNTER — Emergency Department
Admission: EM | Admit: 2017-02-03 | Discharge: 2017-02-03 | Disposition: A | Discharge status: Home / Self Care | Payer: Medicare Other | Attending: Emergency Medicine | Admitting: Emergency Medicine

## 2017-02-03 DIAGNOSIS — B029 Zoster without complications: Secondary | ICD-10-CM | POA: Diagnosis not present

## 2017-02-03 DIAGNOSIS — R21 Rash and other nonspecific skin eruption: Secondary | ICD-10-CM | POA: Diagnosis present

## 2017-02-03 HISTORY — DX: Zoster without complications: B02.9

## 2017-02-03 MED ORDER — OXYCODONE-ACETAMINOPHEN 5-325 MG PO TABS: 1.0000 | ORAL_TABLET | ORAL | 0 refills | Status: DC | PRN

## 2017-02-03 MED ORDER — ACYCLOVIR 400 MG PO TABS: 800.0000 mg | ORAL_TABLET | Freq: Every day | ORAL | 0 refills | Status: DC

## 2017-02-03 MED ORDER — PREDNISONE 10 MG PO TABS: 50.0000 mg | ORAL_TABLET | Freq: Every day | ORAL | 0 refills | Status: DC

## 2017-02-03 MED ORDER — GABAPENTIN 300 MG PO CAPS: 300.0000 mg | ORAL_CAPSULE | Freq: Three times a day (TID) | ORAL | 0 refills | Status: DC

## 2017-02-03 NOTE — ED Provider Notes (Signed)
[facility] Emergency Department Provider Note  ____________________________________________  Time seen: Approximately 6:58 PM  I have reviewed the triage vital signs and the nursing notes.   HISTORY  Chief Complaint Rash   HPI Elizabeth Mata is a 56 y.o. female who presents to the emergency department for evaluation of rash. Symptoms started 2 days ago on her right arm and now has an area on her right breast.She states pain is "running" up her arm and just the brush of her clothing is painful. Symptoms are only on the right side arm and chest wall.   Past Medical History:  Diagnosis Date  . Allergic rhinitis 06/30/2016  . Cyst of breast, left, solitary   . Flank pain   . GERD (gastroesophageal reflux disease)   . Hemorrhoid   . IFG (impaired fasting glucose) 07/18/2016   June 2017   . Kidney stones   . Neuropathy ([place]) 07/24/2016  . Seizures ([place])   . Syncope    Recurrent over the last 2 years. had workup at [facility] this year with cardiology and neurology. Had echo and 3-week event monitor that were unremarkable. Has had negative EEG. Echo in 11/11 at [facility] showed no significant abnormalities. Possibly vasovagal  . Trigger thumb     Patient Active Problem List   Diagnosis Date Noted  . Medication monitoring encounter 12/20/2016  . Weight gain, abnormal 12/20/2016  . Family health problem 12/20/2016  . Abnormality of globulin 07/26/2016  . Neuropathy ([place]) 07/24/2016  . 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 IFG (impaired fasting glucose) 07/18/2016  . Hematuria 07/03/2016  . Allergic rhinitis 06/30/2016  . Chronic left hip pain 06/28/2016  . Breast cancer screening 04/17/2016  . Skin texture changes 12/27/2015  . Vaginal discharge 10/31/2015  . Screening for STD (sexually transmitted disease) 10/31/2015  . Kidney stone on left side 10/24/2015  . Annual physical exam 09/13/2015  . Encounter for screening mammogram for malignant neoplasm of  breast 09/13/2015  . Need for immunization against influenza 09/13/2015  . Eczema 09/13/2015  . Irritable bowel syndrome with constipation 09/13/2015  . Seizures ([place]) 08/16/2015  . Carpal tunnel syndrome 07/26/2014  . Carotid artery narrowing 07/05/2013  . 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 ABDOMINAL HYSTERECTOMY    . BREAST BIOPSY Left 1980'S   EXCISIONAL - NEG  . CHOLECYSTECTOMY    . FOOT SURGERY    . PARTIAL HYSTERECTOMY      Prior to Admission medications   Medication Sig Start Date End Date Taking? Authorizing Provider  acyclovir (ZOVIRAX) 400 MG tablet Take 2 tablets (800 mg total) by mouth 5 (five) times daily. 02/03/17 02/13/17  Victorino Dike, FNP  FLUoxetine (PROZAC) 20 MG tablet Take 1 tablet (20 mg total) by mouth daily. One-half of a pill by mouth daily x 6 days, then one whole pill daily 01/16/17   Arnetha Courser, MD  gabapentin (NEURONTIN) 300 MG capsule Take 1 capsule (300 mg total) by mouth 3 (three) times daily. 02/03/17 02/03/18  Victorino Dike, FNP  levocetirizine (XYZAL) 5 MG tablet Take 1 tablet (5 mg total) by mouth every evening. For allergies (this replaces the allegra) 06/28/16   Arnetha Courser, MD  mirabegron ER (MYRBETRIQ) 25 MG TB24 tablet Take 1 tablet (25 mg total) by mouth daily. 08/08/16   Nickie Retort, MD  oxyCODONE-acetaminophen (ROXICET) 5-325 MG tablet Take 1 tablet by mouth every 4 (four) hours as  needed for severe pain. 02/03/17   Victorino Dike, FNP  predniSONE (DELTASONE) 10 MG tablet Take 5 tablets (50 mg total) by mouth daily. 02/03/17   Victorino Dike, FNP  topiramate (TOPAMAX) 100 MG tablet Take 150 mg by mouth 2 (two) times daily.    Historical Provider, MD    Allergies Clindamycin/lincomycin; Hydrocodone-acetaminophen; Metronidazole; and Sulfa antibiotics  Family History  Problem Relation Age of Onset  . Coronary artery disease Maternal Uncle   . Diabetes Mellitus II Mother   .  Arthritis Mother   . Diabetes Maternal Uncle   . Kidney cancer Father   . Arthritis Maternal Uncle   . Breast cancer Neg Hx   . Bladder Cancer Neg Hx     Social History Social History  Substance Use Topics  . Smoking status: Never Smoker  . Smokeless tobacco: Never Used  . Alcohol use 0.0 oz/week     Comment: occ    Review of Systems  Constitutional: Negative for fever/chills Respiratory: Negative for shortness of breath. Musculoskeletal: Negative for pain. Skin: Positive for pain and rash. Neurological: Negative for headaches, focal weakness or numbness. ____________________________________________   PHYSICAL EXAM:  VITAL SIGNS: ED Triage Vitals  Enc Vitals Group     BP 02/03/17 1801 (!) 120/9     Pulse Rate 02/03/17 1801 64     Resp 02/03/17 1801 16     Temp 02/03/17 1801 97 F (36.1 C)     Temp Source 02/03/17 1801 Oral     SpO2 02/03/17 1801 95 %     Weight 02/03/17 1801 131 lb (59.4 kg)     Height 02/03/17 1801 4\' 11"  (1.499 m)     Head Circumference --      Peak Flow --      Pain Score 02/03/17 1802 8     Pain Loc --      Pain Edu? --      Excl. in [place]? --      Constitutional: Alert and oriented. Well appearing and in no acute distress. Eyes: Conjunctivae are normal. EOMI. Nose: No congestion/rhinnorhea. Mouth/Throat: Mucous membranes are moist.   Neck: No stridor. Lymphatic: No cervical lymphadenopathy. Cardiovascular: Good peripheral circulation. Respiratory: Normal respiratory effort.  No retractions. Lungs clear. Musculoskeletal: FROM throughout. Neurologic:  Normal speech and language. No gross focal neurologic deficits are appreciated. Skin: Vesicular rash noted on the medial aspect of the right upper arm just before the start of the axilla. Rash also present on the right chest wall above right breast, but does not cross the midline. No rash or pain on the left.  ____________________________________________   LABS (all labs ordered are  listed, but only abnormal results are displayed)  Labs Reviewed - No data to display ____________________________________________  EKG   ____________________________________________  RADIOLOGY  Not indicated. ____________________________________________   PROCEDURES  Procedure(s) performed: None ____________________________________________   INITIAL IMPRESSION / ASSESSMENT AND PLAN / ED COURSE     Pertinent labs & imaging results that were available during my care of the patient were reviewed by me and considered in my medical decision making (see chart for details).  56 year old female presenting to the emergency department for painful fascicular rash that started 2-3 days ago. Symptoms and exam is consistent and concerning for herpes zoster. Patient states that she did have chickenpox as a child. She'll be treated with prednisone, valacyclovir, gabapentin, and Percocet. She was instructed to keep her follow-up appointment with her primary care provider as scheduled for Wednesday.  She was instructed to return to the emergency department for symptoms that change or worsen if she is unable schedule an earlier appointment. ____________________________________________   FINAL CLINICAL IMPRESSION(S) / ED DIAGNOSES  Final diagnoses:  Herpes zoster infection of thoracic region    New Prescriptions   ACYCLOVIR (ZOVIRAX) 400 MG TABLET    Take 2 tablets (800 mg total) by mouth 5 (five) times daily.   GABAPENTIN (NEURONTIN) 300 MG CAPSULE    Take 1 capsule (300 mg total) by mouth 3 (three) times daily.   OXYCODONE-ACETAMINOPHEN (ROXICET) 5-325 MG TABLET    Take 1 tablet by mouth every 4 (four) hours as needed for severe pain.   PREDNISONE (DELTASONE) 10 MG TABLET    Take 5 tablets (50 mg total) by mouth daily.    Note:  This document was prepared using Dragon voice recognition software and may include unintentional dictation errors.    Victorino Dike, FNP 02/03/17 [place], MD 02/03/17 2032

## 2017-02-03 NOTE — ED Triage Notes (Signed)
Pt reports she had pain in her right arm - she started with a sore on her arm that then turned into blister - she then started itching right breast area and then pain started and a blister came - blistery rash is from right arm radiating into right chest - pt reports that even her clothes irritate the area

## 2017-02-03 NOTE — Discharge Instructions (Signed)
Follow up with the PCP as scheduled. Take all your medication as prescribed. Return to the ER for symptoms that change or worsen if you are unable to see your PCP sooner.

## 2017-02-03 NOTE — ED Notes (Addendum)
See triage note  States she started out with pain to right arm couple of days ago  Then pain moved up arm  And developed rash today

## 2017-02-05 ENCOUNTER — Encounter: Payer: Self-pay | Admitting: Family Medicine

## 2017-02-05 ENCOUNTER — Ambulatory Visit (INDEPENDENT_AMBULATORY_CARE_PROVIDER_SITE_OTHER): Payer: Medicare Other | Admitting: Family Medicine

## 2017-02-05 VITALS — BP 106/72 | HR 85 | Temp 98.7°F | Resp 16 | Wt 133.3 lb

## 2017-02-05 DIAGNOSIS — B029 Zoster without complications: Secondary | ICD-10-CM

## 2017-02-05 MED ORDER — HYDROCODONE-ACETAMINOPHEN 5-325 MG PO TABS: 1.0000 | ORAL_TABLET | ORAL | 0 refills | Status: DC | PRN

## 2017-02-05 MED ORDER — VALACYCLOVIR HCL 1 G PO TABS: 1000.0000 mg | ORAL_TABLET | Freq: Three times a day (TID) | ORAL | 0 refills | Status: DC

## 2017-02-05 NOTE — Progress Notes (Signed)
BP 106/72   Pulse 85   Temp 98.7 F (37.1 C) (Oral)   Resp 16   Wt 133 lb 5 oz (60.5 kg)   SpO2 95%   BMI 26.93 kg/m    Subjective:    Patient ID: Elizabeth Mata, female    DOB: [birth year], 56 y.o.   MRN: [id number]  HPI: Elizabeth Mata is a 56 y.o. female  Chief Complaint  Patient presents with  . Hospitalization Follow-up    Sine sore and the touch    She has had flu shot this year  She went to the ER this week It all started with her having sensitivity along the skin over the right arm Thursday she felt tingling Then Saturday she saw the blisters on her chest; she was scratching and felt irritated Broken out on the chest and then on the underside of her right arm She went to the ER and they diagnosed her with shingles They put her on acyclovir and prednisone and gabapentin and oxycodone; however, she is allergic to oxycodone; we corrected her allergy list; she can take hydrocodone, but not oxycodone She continues to have discomfort, dysaesthesia, and pain in the distribution of the rash Her father has cancer, so she will avoid exposure to him  Depression screen [facility] 2/9 02/05/2017 12/20/2016 06/28/2016 12/27/2015 09/13/2015  Decreased Interest 0 0 0 0 0  Down, Depressed, Hopeless 0 0 0 0 0  PHQ - 2 Score 0 0 0 0 0    No flowsheet data found.  Relevant past medical, surgical, family and social history reviewed Past Medical History:  Diagnosis Date  . Allergic rhinitis 06/30/2016  . Cyst of breast, left, solitary   . Flank pain   . GERD (gastroesophageal reflux disease)   . Hemorrhoid   . IFG (impaired fasting glucose) 07/18/2016   June 2017   . Kidney stones   . Neuropathy ([place]) 07/24/2016  . Seizures ([place])   . Shingles outbreak 02/03/2017  . Syncope    Recurrent over the last 2 years. had workup at [facility] this year with cardiology and neurology. Had echo and 3-week event monitor that were unremarkable. Has had negative EEG. Echo in 11/11 at [facility]  showed no significant abnormalities. Possibly vasovagal  . Trigger thumb    Past Surgical History:  Procedure Laterality Date  . ABDOMINAL HYSTERECTOMY    . BREAST BIOPSY Left 1980'S   EXCISIONAL - NEG  . CHOLECYSTECTOMY    . FOOT SURGERY    . PARTIAL HYSTERECTOMY     Family History  Problem Relation Age of Onset  . Coronary artery disease Maternal Uncle   . Diabetes Mellitus II Mother   . Arthritis Mother   . Diabetes Maternal Uncle   . Kidney cancer Father   . Arthritis Maternal Uncle   . Breast cancer Neg Hx   . Bladder Cancer Neg Hx    Social History  Substance Use Topics  . Smoking status: Never Smoker  . Smokeless tobacco: Never Used  . Alcohol use 0.0 oz/week     Comment: occ    Interim medical history since last visit reviewed. Allergies and medications reviewed  Review of Systems Per HPI unless specifically indicated above     Objective:    BP 106/72   Pulse 85   Temp 98.7 F (37.1 C) (Oral)   Resp 16   Wt 133 lb 5 oz (60.5 kg)   SpO2 95%   BMI 26.93 kg/m  Wt Readings from Last 3 Encounters:  02/05/17 133 lb 5 oz (60.5 kg)  02/03/17 131 lb (59.4 kg)  12/20/16 137 lb 3 oz (62.2 kg)    Physical Exam  Constitutional: She appears well-developed and well-nourished.  Cardiovascular: Normal rate.   Pulmonary/Chest: Effort normal.  Skin: Rash (reddish-purplish vesicles, erythematous base, dermatomal distribution right side of chest and right medial arm) noted.     Psychiatric: Her mood appears not anxious. She does not exhibit a depressed mood.      Assessment & Plan:   Problem List Items Addressed This Visit    None    Visit Diagnoses    Herpes zoster without complication    -  Primary   will have her STOP the prednisone and acyclovir; use valacyclovir instead; pain control with hydrocodone; see AVS; explained spread, people to avoid   Relevant Medications   valACYclovir (VALTREX) 1000 MG tablet    Discussed avoidance of babies, patients  with cancer, on chemotherapy, pregnant women Explained she cannot spread shingles, but she can spread chickenpox If pain persisting after Rx of pain medicine done, she may call for a refill Do not drink alcohol or drive after taking pain medicine Do not advertise that she has the pain medicine, as people on social media may try to break into her home Call if any issues   Follow up plan: No Follow-up on file.  An after-visit summary was printed and given to the patient at [place].  Please see the patient instructions which may contain other information and recommendations beyond what is mentioned above in the assessment and plan.  Meds ordered this encounter  Medications  . topiramate (TOPAMAX) 200 MG tablet    Sig: TAKE 1 TABLET (200 MG TOTAL) BY MOUTH 2 (TWO) TIMES DAILY.    Refill:  11  . valACYclovir (VALTREX) 1000 MG tablet    Sig: Take 1 tablet (1,000 mg total) by mouth 3 (three) times daily. (this replaces acyclovir)    Dispense:  21 tablet    Refill:  0    Stopping acyclovir, ineffective  . HYDROcodone-acetaminophen (NORCO/VICODIN) 5-325 MG tablet    Sig: Take 1 tablet by mouth every 4 (four) hours as needed for moderate pain or severe pain.    Dispense:  30 tablet    Refill:  0    No orders of the defined types were placed in this encounter.

## 2017-02-05 NOTE — Patient Instructions (Addendum)
Stop the acyclovir Stop the gabapentin Stop the prednisone Stop oxycodone  Start valtrex (valacyclovir) Use the hydrocodone if needed for pain   Shingles Shingles, which is also known as herpes zoster, is an infection that causes a painful skin rash and fluid-filled blisters. Shingles is not related to genital herpes, which is a sexually transmitted infection. Shingles only develops in people who:  Have had chickenpox.  Have received the chickenpox vaccine. (This is rare.) What are the causes? Shingles is caused by varicella-zoster virus (VZV). This is the same virus that causes chickenpox. After exposure to VZV, the virus stays in the body in an inactive (dormant) state. Shingles develops if the virus reactivates. This can happen many years after the initial exposure to VZV. It is not known what causes this virus to reactivate. What increases the risk? People who have had chickenpox or received the chickenpox vaccine are at risk for shingles. Infection is more common in people who:  Are older than age 28.  Have a weakened defense (immune) system, such as those with HIV, AIDS, or cancer.  Are taking medicines that weaken the immune system, such as transplant medicines.  Are under great stress. What are the signs or symptoms? Early symptoms of this condition include itching, tingling, and pain in an area on your skin. Pain may be described as burning, stabbing, or throbbing. A few days or weeks after symptoms start, a painful red rash appears, usually on one side of the body in a bandlike or beltlike pattern. The rash eventually turns into fluid-filled blisters that break open, scab over, and dry up in about 2-3 weeks. At any time during the infection, you may also develop:  A fever.  Chills.  A headache.  An upset stomach. How is this diagnosed? This condition is diagnosed with a skin exam. Sometimes, skin or fluid samples are taken from the blisters before a diagnosis is  made. These samples are examined under a microscope or sent to a lab for testing. How is this treated? There is no specific cure for this condition. Your health care provider will probably prescribe medicines to help you manage pain, recover more quickly, and avoid long-term problems. Medicines may include:  Antiviral drugs.  Anti-inflammatory drugs.  Pain medicines. If the area involved is on your face, you may be referred to a specialist, such as an eye doctor (ophthalmologist) or an ear, nose, and throat (ENT) doctor to help you avoid eye problems, chronic pain, or disability. Follow these instructions at home: Medicines  Take medicines only as directed by your health care provider.  Apply an anti-itch or numbing cream to the affected area as directed by your health care provider. Blister and Rash Care  Take a cool bath or apply cool compresses to the area of the rash or blisters as directed by your health care provider. This may help with pain and itching.  Keep your rash covered with a loose bandage (dressing). Wear loose-fitting clothing to help ease the pain of material rubbing against the rash.  Keep your rash and blisters clean with mild soap and cool water or as directed by your health care provider.  Check your rash every day for signs of infection. These include redness, swelling, and pain that lasts or increases.  Do not pick your blisters.  Do not scratch your rash. General instructions  Rest as directed by your health care provider.  Keep all follow-up visits as directed by your health care provider. This is important.  Until your blisters scab over, your infection can cause chickenpox in people who have never had it or been vaccinated against it. To prevent this from happening, avoid contact with other people, especially:  Babies.  Pregnant women.  Children who have eczema.  Elderly people who have transplants.  People who have chronic illnesses, such as  leukemia or AIDS. Contact a health care provider if:  Your pain is not relieved with prescribed medicines.  Your pain does not get better after the rash heals.  Your rash looks infected. Signs of infection include redness, swelling, and pain that lasts or increases. Get help right away if:  The rash is on your face or nose.  You have facial pain, pain around your eye area, or loss of feeling on one side of your face.  You have ear pain or you have ringing in your ear.  You have loss of taste.  Your condition gets worse. This information is not intended to replace advice given to you by your health care provider. Make sure you discuss any questions you have with your health care provider. Document Released: 12/16/2005 Document Revised: 08/11/2016 Document Reviewed: 10/27/2014 Elsevier Interactive Patient Education  2017 [employer].

## 2017-02-11 ENCOUNTER — Telehealth: Payer: Self-pay

## 2017-02-11 MED ORDER — PREGABALIN 75 MG PO CAPS: 75.0000 mg | ORAL_CAPSULE | Freq: Two times a day (BID) | ORAL | 0 refills | Status: DC

## 2017-02-11 NOTE — Telephone Encounter (Signed)
Patient notified will schedule an appt 

## 2017-02-11 NOTE — Telephone Encounter (Signed)
Patient called states still has rash and in a lot of pain wants to see about maybe getting another round of valtrex?  Please advise

## 2017-02-11 NOTE — Telephone Encounter (Signed)
Unfortunately, shingles won't respond to repeated doses of Valtrex; once she finishes the 7 day course, that's all we do I can start her on Lyrica for additional pain control appt in 1-2 weeks please

## 2017-02-14 DIAGNOSIS — G609 Hereditary and idiopathic neuropathy, unspecified: Secondary | ICD-10-CM | POA: Diagnosis not present

## 2017-02-14 DIAGNOSIS — G5603 Carpal tunnel syndrome, bilateral upper limbs: Secondary | ICD-10-CM | POA: Diagnosis not present

## 2017-02-18 ENCOUNTER — Ambulatory Visit: Payer: Medicare Other | Admitting: Family Medicine

## 2017-03-14 ENCOUNTER — Other Ambulatory Visit: Payer: Self-pay | Admitting: Family Medicine

## 2017-03-14 MED ORDER — FLUOXETINE HCL 20 MG PO CAPS: 20.0000 mg | ORAL_CAPSULE | Freq: Every day | ORAL | 1 refills | Status: DC

## 2017-04-03 ENCOUNTER — Encounter: Payer: Self-pay | Admitting: Family Medicine

## 2017-04-03 ENCOUNTER — Ambulatory Visit (INDEPENDENT_AMBULATORY_CARE_PROVIDER_SITE_OTHER): Payer: Medicare Other | Admitting: Family Medicine

## 2017-04-03 VITALS — BP 110/68 | HR 76 | Temp 98.0°F | Resp 14 | Wt 130.0 lb

## 2017-04-03 DIAGNOSIS — R102 Pelvic and perineal pain: Secondary | ICD-10-CM | POA: Diagnosis not present

## 2017-04-03 DIAGNOSIS — R42 Dizziness and giddiness: Secondary | ICD-10-CM

## 2017-04-03 DIAGNOSIS — N898 Other specified noninflammatory disorders of vagina: Secondary | ICD-10-CM | POA: Diagnosis not present

## 2017-04-03 NOTE — Patient Instructions (Addendum)
We'll get the ultrasound of your pelvis We'll contact you tomorrow about the test If you have not heard anything from my staff in a week about any orders/referrals/studies from today, please contact us here to follow-up [phone]  How to Perform the Epley Maneuver The Epley maneuver is an exercise that relieves symptoms of vertigo. Vertigo is the feeling that you or your surroundings are moving when they are not. When you feel vertigo, you may feel like the room is spinning and have trouble walking. Dizziness is a little different than vertigo. When you are dizzy, you may feel unsteady or light-headed. You can do this maneuver at home whenever you have symptoms of vertigo. You can do it up to 3 times a day until your symptoms go away. Even though the Epley maneuver may relieve your vertigo for a few weeks, it is possible that your symptoms will return. This maneuver relieves vertigo, but it does not relieve dizziness. What are the risks? If it is done correctly, the Epley maneuver is considered safe. Sometimes it can lead to dizziness or nausea that goes away after a short time. If you develop other symptoms, such as changes in vision, weakness, or numbness, stop doing the maneuver and call your health care provider. How to perform the Epley maneuver 1. Sit on the edge of a bed or table with your back straight and your legs extended or hanging over the edge of the bed or table. 2. Turn your head halfway toward the affected ear or side. 3. Lie backward quickly with your head turned until you are lying flat on your back. You may want to position a pillow under your shoulders. 4. Hold this position for 30 seconds. You may experience an attack of vertigo. This is normal. 5. Turn your head to the opposite direction until your unaffected ear is facing the floor. 6. Hold this position for 30 seconds. You may experience an attack of vertigo. This is normal. Hold this position until the vertigo  stops. 7. Turn your whole body to the same side as your head. Hold for another 30 seconds. 8. Sit back up. You can repeat this exercise up to 3 times a day. Follow these instructions at home:  After doing the Epley maneuver, you can return to your normal activities.  Ask your health care provider if there is anything you should do at home to prevent vertigo. He or she may recommend that you:  Keep your head raised (elevated) with two or more pillows while you sleep.  Do not sleep on the side of your affected ear.  Get up slowly from bed.  Avoid sudden movements during the day.  Avoid extreme head movement, like looking up or bending over. Contact a health care provider if:  Your vertigo gets worse.  You have other symptoms, including:  Nausea.  Vomiting.  Headache. Get help right away if:  You have vision changes.  You have a severe or worsening headache or neck pain.  You cannot stop vomiting.  You have new numbness or weakness in any part of your body. Summary  Vertigo is the feeling that you or your surroundings are moving when they are not.  The Epley maneuver is an exercise that relieves symptoms of vertigo.  If the Epley maneuver is done correctly, it is considered safe. You can do it up to 3 times a day. This information is not intended to replace advice given to you by your health care provider. Make  sure you discuss any questions you have with your health care provider. Document Released: 12/21/2013 Document Revised: 11/05/2016 Document Reviewed: 11/05/2016 Elsevier Interactive Patient Education  2017 [employer].

## 2017-04-03 NOTE — Progress Notes (Signed)
BP 110/68   Pulse 76   Temp 98 F (36.7 C) (Oral)   Resp 14   Wt 130 lb (59 kg)   SpO2 95%   BMI 26.26 kg/m    Subjective:    Patient ID: Elizabeth Mata, female    DOB: Apr 13, 1961, 56 y.o.   MRN: [id number]  HPI: Elizabeth Mata is a 56 y.o. female  Chief Complaint  Patient presents with  . Dizziness  . vaginal odor   Patient is here for an acute visit No medical excitement since last visit She is here for for vaginal odor; not sure if yeast or bacterial infection No discharge  Goes to neurologist in [place] Having some allergies She is kind of dizzy, not sure if allergies She can get a little dizzy in the bathtub; had to hold on the wall, more than once More than once No headaches, no stroke symptoms Just fleeting, a few seconds Hydrated No skipping meals, snacks Maybe turning her head and that bring it on a little  She had a hysterectomy and says she had a spot on her ovaries that was never followed up; she is having some bloating, feels uncomfortable  Depression screen [facility] 2/9 04/03/2017 02/05/2017 12/20/2016 06/28/2016 12/27/2015  Decreased Interest 0 0 0 0 0  Down, Depressed, Hopeless 0 0 0 0 0  PHQ - 2 Score 0 0 0 0 0    Relevant past medical, surgical, family and social history reviewed Past Medical History:  Diagnosis Date  . Allergic rhinitis 06/30/2016  . Cyst of breast, left, solitary   . Flank pain   . GERD (gastroesophageal reflux disease)   . Hemorrhoid   . IFG (impaired fasting glucose) 07/18/2016   June 2017   . Kidney stones   . Neuropathy 07/24/2016  . Seizures ([place])   . Shingles outbreak 02/03/2017  . Syncope    Recurrent over the last 2 years. had workup at [facility] this year with cardiology and neurology. Had echo and 3-week event monitor that were unremarkable. Has had negative EEG. Echo in 11/11 at [facility] showed no significant abnormalities. Possibly vasovagal  . Trigger thumb    Past Surgical History:  Procedure Laterality  Date  . ABDOMINAL HYSTERECTOMY     endometriosis, fibroid tumors; no cancer  . BREAST BIOPSY Left 1980'S   EXCISIONAL - NEG  . CHOLECYSTECTOMY    . FOOT SURGERY    . PARTIAL HYSTERECTOMY     Family History  Problem Relation Age of Onset  . Coronary artery disease Maternal Uncle   . Diabetes Mellitus II Mother   . Arthritis Mother   . Diabetes Maternal Uncle   . Kidney cancer Father   . Arthritis Maternal Uncle   . Breast cancer Neg Hx   . Bladder Cancer Neg Hx    Social History  Substance Use Topics  . Smoking status: Never Smoker  . Smokeless tobacco: Never Used  . Alcohol use 0.0 oz/week     Comment: occ   Interim medical history since last visit reviewed. Allergies and medications reviewed  Review of Systems Per HPI unless specifically indicated above     Objective:    BP 110/68   Pulse 76   Temp 98 F (36.7 C) (Oral)   Resp 14   Wt 130 lb (59 kg)   SpO2 95%   BMI 26.26 kg/m   Wt Readings from Last 3 Encounters:  04/03/17 130 lb (59 kg)  02/05/17 133 lb 5  oz (60.5 kg)  02/03/17 131 lb (59.4 kg)    Physical Exam  Constitutional: She appears well-developed and well-nourished. No distress.  HENT:  Head: Normocephalic and atraumatic.  Right Ear: No middle ear effusion.  Left Ear:  No middle ear effusion.  Nose: No rhinorrhea.  Mouth/Throat: Oropharynx is clear and moist and mucous membranes are normal.  Eyes: EOM are normal. No scleral icterus.  No ptosis or lid lag, no exophthalmos  Neck: No thyromegaly present.  Cardiovascular: Normal rate, regular rhythm and normal heart sounds.   No murmur heard. Pulmonary/Chest: Effort normal and breath sounds normal. No respiratory distress. She has no wheezes.  Abdominal: Soft. Bowel sounds are normal. She exhibits no distension. There is tenderness in the left lower quadrant. There is no rigidity.  Musculoskeletal: Normal range of motion. She exhibits no edema.  Neurological: She is alert. She displays no  atrophy and no tremor. No cranial nerve deficit or sensory deficit. She displays no seizure activity.  Reflex Scores:      Patellar reflexes are 1+ on the right side and 1+ on the left side. No tics; grip 5/5  Skin: Skin is warm and dry. No rash noted. She is not diaphoretic. No pallor.  Psychiatric: Her behavior is normal. Judgment and thought content normal. Her mood appears anxious. She does not exhibit a depressed mood.  Very pleasant, cooperative      Assessment & Plan:   Problem List Items Addressed This Visit    None    Visit Diagnoses    Pelvic pressure in female    -  Primary   with report of previous "spot" that wasn't checked; will get [place]   Relevant Orders   US Pelvis Complete   US Transvaginal Non-OB   Urinalysis w microscopic + reflex cultur (Completed)   WET PREP BY MOLECULAR PROBE (Completed)   Vaginal odor       wet prep collected, treat as indicated; suspect BV   Relevant Orders   Urinalysis w microscopic + reflex cultur (Completed)   WET PREP BY MOLECULAR PROBE (Completed)   Dizziness       encouraged patient to see her neurologist; call 911 if s/s of stroke       Follow up plan: Return if symptoms worsen or fail to improve.  An after-visit summary was printed and given to the patient at [place].  Please see the patient instructions which may contain other information and recommendations beyond what is mentioned above in the assessment and plan.  No orders of the defined types were placed in this encounter.   Orders Placed This Encounter  Procedures  . WET PREP BY MOLECULAR PROBE  . US Pelvis Complete  . US Transvaginal Non-OB  . Urinalysis w microscopic + reflex cultur

## 2017-04-04 ENCOUNTER — Telehealth: Payer: Self-pay

## 2017-04-04 ENCOUNTER — Other Ambulatory Visit: Payer: Self-pay | Admitting: Family Medicine

## 2017-04-04 LAB — URINALYSIS W MICROSCOPIC + REFLEX CULTURE
Bacteria, UA: NONE SEEN [HPF]
Bilirubin Urine: NEGATIVE
Casts: NONE SEEN [LPF]
Crystals: NONE SEEN [HPF]
Glucose, UA: NEGATIVE
Ketones, ur: NEGATIVE
Leukocytes, UA: NEGATIVE
Nitrite: NEGATIVE
Protein, ur: NEGATIVE
RBC / HPF: NONE SEEN RBC/HPF (ref <=2)
Specific Gravity, Urine: 1.007 (ref 1.001–1.035)
WBC, UA: NONE SEEN WBC/HPF (ref <=5)
Yeast: NONE SEEN [HPF]
pH: 7 (ref 5.0–8.0)

## 2017-04-04 LAB — WET PREP BY MOLECULAR PROBE
Candida species: NOT DETECTED
Gardnerella vaginalis: DETECTED — AB
Trichomonas vaginosis: NOT DETECTED

## 2017-04-04 MED ORDER — AZITHROMYCIN 250 MG PO TABS: ORAL_TABLET | ORAL | 0 refills | Status: AC

## 2017-04-04 NOTE — Progress Notes (Signed)
Patient is allergic to clinda and metronidazole Up-to-Date consulted Will try azithromycin, though reports show not nearly as effective

## 2017-04-04 NOTE — Telephone Encounter (Signed)
Pt would like results. She stated you mention they would be in today.

## 2017-04-04 NOTE — Telephone Encounter (Signed)
addressed

## 2017-04-10 ENCOUNTER — Ambulatory Visit: Payer: Medicare Other

## 2017-04-11 ENCOUNTER — Ambulatory Visit: Payer: Medicare Other

## 2017-04-18 ENCOUNTER — Ambulatory Visit
Admission: RE | Admit: 2017-04-18 | Discharge: 2017-04-18 | Disposition: A | Discharge status: Home / Self Care | Payer: Medicare Other | Source: Ambulatory Visit | Attending: Family Medicine | Admitting: Family Medicine

## 2017-04-18 DIAGNOSIS — Z90710 Acquired absence of both cervix and uterus: Secondary | ICD-10-CM | POA: Insufficient documentation

## 2017-04-18 DIAGNOSIS — R102 Pelvic and perineal pain: Secondary | ICD-10-CM | POA: Insufficient documentation

## 2017-04-18 DIAGNOSIS — N83202 Unspecified ovarian cyst, left side: Secondary | ICD-10-CM | POA: Insufficient documentation

## 2017-05-16 ENCOUNTER — Telehealth: Payer: Self-pay | Admitting: Family Medicine

## 2017-05-16 MED ORDER — MONTELUKAST SODIUM 10 MG PO TABS: 10.0000 mg | ORAL_TABLET | Freq: Every day | ORAL | 6 refills | Status: DC

## 2017-05-16 MED ORDER — FLUTICASONE PROPIONATE 50 MCG/ACT NA SUSP: 2.0000 | Freq: Every day | NASAL | 6 refills | Status: DC

## 2017-05-16 NOTE — Telephone Encounter (Signed)
Due to having severe allergies pt is asking that you please up the dosage on levocetirizine. Please send new script to [facility]. She can be reached at [phone]

## 2017-05-16 NOTE — Telephone Encounter (Signed)
Pt.notified

## 2017-05-16 NOTE — Telephone Encounter (Signed)
That's the maximum dose of the levocetirizine This has been a really bad allergy year for people, so I'll be glad to add singulair (a pill) and/or a nasal corticosteroid for her Both work best if used for weeks at a time instead of just day to day PRN

## 2017-06-20 ENCOUNTER — Ambulatory Visit: Payer: Medicare Other | Admitting: Family Medicine

## 2017-07-07 ENCOUNTER — Other Ambulatory Visit: Payer: Self-pay | Admitting: Family Medicine

## 2017-07-11 ENCOUNTER — Ambulatory Visit (INDEPENDENT_AMBULATORY_CARE_PROVIDER_SITE_OTHER): Payer: Medicare Other | Admitting: Family Medicine

## 2017-07-11 ENCOUNTER — Telehealth: Payer: Self-pay

## 2017-07-11 ENCOUNTER — Encounter: Payer: Self-pay | Admitting: Family Medicine

## 2017-07-11 VITALS — BP 122/68 | HR 70 | Temp 98.2°F | Resp 16 | Wt 126.1 lb

## 2017-07-11 DIAGNOSIS — N644 Mastodynia: Secondary | ICD-10-CM | POA: Insufficient documentation

## 2017-07-11 DIAGNOSIS — B354 Tinea corporis: Secondary | ICD-10-CM

## 2017-07-11 MED ORDER — FEXOFENADINE HCL 180 MG PO TABS: 180.0000 mg | ORAL_TABLET | Freq: Every day | ORAL | 3 refills | Status: DC | PRN

## 2017-07-11 MED ORDER — CLOTRIMAZOLE 1 % EX CREA: 1.0000 "application " | TOPICAL_CREAM | Freq: Two times a day (BID) | CUTANEOUS | 1 refills | Status: DC

## 2017-07-11 NOTE — Telephone Encounter (Signed)
Patient wants to see if you can write rx for Allegra, she states the xzyal was not working?

## 2017-07-11 NOTE — Patient Instructions (Addendum)
Use the new cream on those two places, may take up to four weeks We'll get imaging and refer you to a surgeon about your breast pain

## 2017-07-11 NOTE — Progress Notes (Signed)
BP 122/68   Pulse 70   Temp 98.2 F (36.8 C) (Oral)   Resp 16   Wt 126 lb 1.6 oz (57.2 kg)   SpO2 99%   BMI 25.47 kg/m    Subjective:    Patient ID: Elizabeth Mata, female    DOB: 1961/02/11, 56 y.o.   MRN: [id number]  HPI: Elizabeth Mata is a 56 y.o. female  Chief Complaint  Patient presents with  . Medication Refill    6 month F/U  . Breast Pain    Left Breast Pain, Onset- couple of months and needs another Mammogram  . Rash    back of neck and arms and itches    HPI She has been going out in the sun; started in April; started to burn, over the forearms; both arms; back of the neck; using witch hazel, hydrocortisone, not improving  She is having pain in her breast; had a lump taking out about 11 o'clock, but pain is like 12 to 1 o'clock area now; started a couple of months ago, but her father died and has had a lot going on Months it's been hurting; feels like it getting hard  Depression screen [facility] 2/9 07/11/2017 04/03/2017 02/05/2017 12/20/2016 06/28/2016  Decreased Interest 0 0 0 0 0  Down, Depressed, Hopeless 0 0 0 0 0  PHQ - 2 Score 0 0 0 0 0    Relevant past medical, surgical, family and social history reviewed Past Medical History:  Diagnosis Date  . Allergic rhinitis 06/30/2016  . Cyst of breast, left, solitary   . Flank pain   . GERD (gastroesophageal reflux disease)   . Hemorrhoid   . IFG (impaired fasting glucose) 07/18/2016   June 2017   . Kidney stones   . Neuropathy 07/24/2016  . Seizures ([place])   . Shingles outbreak 02/03/2017  . Syncope    Recurrent over the last 2 years. had workup at [facility] this year with cardiology and neurology. Had echo and 3-week event monitor that were unremarkable. Has had negative EEG. Echo in 11/11 at [facility] showed no significant abnormalities. Possibly vasovagal  . Trigger thumb    Past Surgical History:  Procedure Laterality Date  . ABDOMINAL HYSTERECTOMY     endometriosis, fibroid tumors; no cancer  .  BREAST BIOPSY Left 1980'S   EXCISIONAL - NEG  . CHOLECYSTECTOMY    . FOOT SURGERY    . PARTIAL HYSTERECTOMY     Family History  Problem Relation Age of Onset  . Coronary artery disease Maternal Uncle   . Diabetes Mellitus II Mother   . Arthritis Mother   . Diabetes Maternal Uncle   . Kidney cancer Father        died 13-May-2017  . Arthritis Maternal Uncle   . Breast cancer Neg Hx   . Bladder Cancer Neg Hx    Social History   Social History  . Marital status: Legally Separated    Spouse name: N/A  . Number of children: N/A  . Years of education: N/A   Occupational History  . Patent examiner    Social History Main Topics  . Smoking status: Never Smoker  . Smokeless tobacco: Never Used  . Alcohol use 0.0 oz/week     Comment: occ  . Drug use: No  . Sexual activity: Yes    Partners: Male   Other Topics Concern  . Not on file   Social History Narrative   Regular exercise  Interim medical history since last visit reviewed. Allergies and medications reviewed  Review of Systems Per HPI unless specifically indicated above     Objective:    BP 122/68   Pulse 70   Temp 98.2 F (36.8 C) (Oral)   Resp 16   Wt 126 lb 1.6 oz (57.2 kg)   SpO2 99%   BMI 25.47 kg/m   Wt Readings from Last 3 Encounters:  07/11/17 126 lb 1.6 oz (57.2 kg)  04/03/17 130 lb (59 kg)  02/05/17 133 lb 5 oz (60.5 kg)    Physical Exam  Constitutional: She appears well-developed and well-nourished.  HENT:  Mouth/Throat: Mucous membranes are normal.  Eyes: No scleral icterus.  Cardiovascular: Normal rate and regular rhythm.   Pulmonary/Chest: Effort normal and breath sounds normal. Right breast exhibits no inverted nipple, no mass, no nipple discharge, no skin change and no tenderness. Left breast exhibits mass. Left breast exhibits no inverted nipple, no nipple discharge, no skin change and no tenderness. Breasts are symmetrical.    Slightly tender mass LEFT breast  Skin:      Two lesions, one on the right side of the neck and one on the right proximal forearm; both are slightly lichenified with raised borders, slight scale; slight hyperpigmentation, dime to nickel size  Psychiatric: She has a normal mood and affect. Her behavior is normal. Her mood appears not anxious. She does not exhibit a depressed mood.       Assessment & Plan:   Problem List Items Addressed This Visit      Other   Breast pain in female    12 to 1 o'clock position in LEFT breast; ordering imaging and referring to surgeon      Relevant Orders   Ambulatory referral to General Surgery   MM DIAG BREAST TOMO BILATERAL   US BREAST LTD UNI LEFT INC AXILLA    Other Visit Diagnoses    Tinea corporis    -  Primary   explained dx, start cream, use for 4 weeks   Relevant Medications   clotrimazole (LOTRIMIN) 1 % cream      Follow up plan: No Follow-up on file.  An after-visit summary was printed and given to the patient at [place].  Please see the patient instructions which may contain other information and recommendations beyond what is mentioned above in the assessment and plan.  Meds ordered this encounter  Medications  . fexofenadine (ALLEGRA) 180 MG tablet    Sig: Take 180 mg by mouth daily.  . clotrimazole (LOTRIMIN) 1 % cream    Sig: Apply 1 application topically 2 (two) times daily.    Dispense:  30 g    Refill:  1    Orders Placed This Encounter  Procedures  . MM DIAG BREAST TOMO BILATERAL  . US BREAST LTD UNI LEFT INC AXILLA  . Ambulatory referral to General Surgery

## 2017-07-11 NOTE — Assessment & Plan Note (Signed)
12 to 1 o'clock position in LEFT breast; ordering imaging and referring to surgeon

## 2017-07-11 NOTE — Telephone Encounter (Signed)
Yes, Rx sent 

## 2017-07-18 ENCOUNTER — Ambulatory Visit
Admission: RE | Admit: 2017-07-18 | Discharge: 2017-07-18 | Disposition: A | Discharge status: Home / Self Care | Payer: Medicare Other | Source: Ambulatory Visit | Attending: Family Medicine | Admitting: Family Medicine

## 2017-07-18 DIAGNOSIS — N644 Mastodynia: Secondary | ICD-10-CM

## 2017-07-18 DIAGNOSIS — N6321 Unspecified lump in the left breast, upper outer quadrant: Secondary | ICD-10-CM | POA: Insufficient documentation

## 2017-07-18 DIAGNOSIS — R922 Inconclusive mammogram: Secondary | ICD-10-CM | POA: Diagnosis not present

## 2017-07-18 DIAGNOSIS — N6489 Other specified disorders of breast: Secondary | ICD-10-CM | POA: Diagnosis not present

## 2017-07-21 ENCOUNTER — Encounter: Payer: Self-pay | Admitting: *Deleted

## 2017-07-23 ENCOUNTER — Ambulatory Visit: Payer: Medicare Other | Admitting: General Surgery

## 2017-07-24 ENCOUNTER — Other Ambulatory Visit: Payer: Self-pay

## 2017-07-24 DIAGNOSIS — N644 Mastodynia: Secondary | ICD-10-CM

## 2017-07-31 ENCOUNTER — Encounter: Payer: Self-pay | Admitting: General Surgery

## 2017-07-31 ENCOUNTER — Ambulatory Visit (INDEPENDENT_AMBULATORY_CARE_PROVIDER_SITE_OTHER): Payer: Medicare Other | Admitting: General Surgery

## 2017-07-31 VITALS — BP 123/74 | HR 78 | Resp 14 | Ht 62.0 in | Wt 126.0 lb

## 2017-07-31 DIAGNOSIS — N644 Mastodynia: Secondary | ICD-10-CM

## 2017-07-31 NOTE — Patient Instructions (Signed)
May make use of Aleve 1-2 tabs a day for 10 days. Wear a snug bra during the day and also at night, may try a sports bra.

## 2017-07-31 NOTE — Progress Notes (Signed)
Patient ID: Elizabeth Mata, female   DOB: 1961/07/22, 56 y.o.   MRN: [id number]  Chief Complaint  Patient presents with  . Other    breast pain    HPI Elizabeth Mata is a 56 y.o. female here for evaluation of pain in her left breast. This has been ongoing for the last 3 months. Her last mammogram and ultrasound was done on 07/18/17. She gets regular mammograms done and does self breast checks. She reports that she had intense left nipple pain, but no drainage.  She states that the pain is throbbing off and on that occurs every day.  HPI  Past Medical History:  Diagnosis Date  . Allergic rhinitis 06/30/2016  . Cyst of breast, left, solitary   . Flank pain   . GERD (gastroesophageal reflux disease)   . Hemorrhoid   . IFG (impaired fasting glucose) 07/18/2016   June 2017   . Kidney stones   . Neuropathy 07/24/2016  . Seizures ([place])   . Shingles outbreak 02/03/2017  . Syncope    Recurrent over the last 2 years. had workup at [facility] this year with cardiology and neurology. Had echo and 3-week event monitor that were unremarkable. Has had negative EEG. Echo in 11/11 at [facility] showed no significant abnormalities. Possibly vasovagal  . Trigger thumb     Past Surgical History:  Procedure Laterality Date  . ABDOMINAL HYSTERECTOMY  40s   endometriosis, fibroid tumors; no cancer, partial  . BREAST BIOPSY Left 1980'S   EXCISIONAL - NEG  . CHOLECYSTECTOMY  31  . FOOT SURGERY Right    tendon release    Family History  Problem Relation Age of Onset  . Coronary artery disease Maternal Uncle   . Diabetes Mellitus II Mother   . Arthritis Mother   . Diabetes Maternal Uncle   . Kidney cancer Father 72       died 04/30/2017  . Arthritis Maternal Uncle   . Cancer Maternal Grandmother   . Stomach cancer Paternal Grandmother   . Breast cancer Neg Hx   . Bladder Cancer Neg Hx     Social History Social History  Substance Use Topics  . Smoking status: Never Smoker  .  Smokeless tobacco: Never Used  . Alcohol use 0.0 oz/week     Comment: occ    Allergies  Allergen Reactions  . Clindamycin/Lincomycin Hives  . Metronidazole   . Sulfa Antibiotics   . Oxycodone Nausea And Vomiting    Current Outpatient Prescriptions  Medication Sig Dispense Refill  . clotrimazole (LOTRIMIN) 1 % cream Apply 1 application topically 2 (two) times daily. 30 g 1  . fexofenadine (ALLEGRA) 180 MG tablet Take 1 tablet (180 mg total) by mouth daily as needed for allergies or rhinitis. 90 tablet 3  . fluticasone (FLONASE) 50 MCG/ACT nasal spray Place 2 sprays into both nostrils daily. (Patient taking differently: Place 2 sprays into both nostrils daily as needed. ) 16 g 6  . mirabegron ER (MYRBETRIQ) 25 MG TB24 tablet Take 1 tablet (25 mg total) by mouth daily. (Patient taking differently: Take 25 mg by mouth as needed. ) 30 tablet 11  . topiramate (TOPAMAX) 200 MG tablet TAKE 1 TABLET (200 MG TOTAL) BY MOUTH 2 (TWO) TIMES DAILY.  11   No current facility-administered medications for this visit.     Review of Systems Review of Systems  Constitutional: Negative.   Respiratory: Negative.   Cardiovascular: Negative.     Blood pressure 123/74,  pulse 78, resp. rate 14, height 5\' 2"  (1.575 m), weight 126 lb (57.2 kg).  Physical Exam Physical Exam  Constitutional: She is oriented to person, place, and time. She appears well-developed and well-nourished.  Eyes: Conjunctivae are normal. No scleral icterus.  Neck: Neck supple.  Cardiovascular: Normal rate, regular rhythm and normal heart sounds.   Pulmonary/Chest: Effort normal and breath sounds normal. Right breast exhibits no inverted nipple, no mass, no nipple discharge, no skin change and no tenderness. Left breast exhibits no inverted nipple, no mass, no nipple discharge, no skin change and no tenderness.    Lymphadenopathy:    She has no cervical adenopathy.    She has no axillary adenopathy.  Neurological: She is alert  and oriented to person, place, and time.  Skin: Skin is warm and dry.  Psychiatric: She has a normal mood and affect.    Data Reviewed Mammogram and ultrasound -no findings Assessment      Left breast mastalgia. No findings on exam or imaging. Location of pain appears to be in the region of the pectoral fold in the inferior aspect. Possible this is related to muscular source  Plan   patient advised on above findings Suggested the use of a tight fitting bra or sports bra. Over-the-counter anti-inflammatories such as Motrin or Aleve. Patient advised to call if the pain worsens or she develops any new symptoms in the breast area. Otherwise she will require just a routine screening mammogram in 1 year    HPI, Physical Exam, Assessment and Plan have been scribed under the direction and in the presence of Mckinley Jewel, MD  Concepcion Living, LPN  I have completed the exam and reviewed the above documentation for accuracy and completeness.  I agree with the above.  Haematologist has been used and any errors in dictation or transcription are unintentional.  Seeplaputhur G. Jamal Collin, M.D., F.A.C.S.    Junie Panning G 08/01/2017, 8:47 AM

## 2017-08-20 ENCOUNTER — Telehealth: Payer: Self-pay | Admitting: Family Medicine

## 2017-08-20 MED ORDER — FLUCONAZOLE 150 MG PO TABS: 150.0000 mg | ORAL_TABLET | Freq: Once | ORAL | 0 refills | Status: AC

## 2017-08-20 NOTE — Telephone Encounter (Signed)
Pt requesting a prescription for yeast infection. It began on Monday. Having itching, cottage cheese discharge. Please send to [facility].

## 2017-08-20 NOTE — Telephone Encounter (Signed)
Rx sent; if not improving, please come in for an appt; thank you

## 2017-08-21 NOTE — Telephone Encounter (Signed)
Patient notified

## 2017-08-22 ENCOUNTER — Ambulatory Visit: Payer: Medicare Other

## 2017-08-22 ENCOUNTER — Telehealth: Payer: Self-pay

## 2017-08-22 MED ORDER — CICLOPIROX OLAMINE 0.77 % EX CREA: TOPICAL_CREAM | Freq: Two times a day (BID) | CUTANEOUS | 0 refills | Status: DC

## 2017-08-22 NOTE — Telephone Encounter (Signed)
We could either switch medicines or I could put in a referral to a dermatologist; I'm happy to do whichever she'd like to try Thank you

## 2017-08-22 NOTE — Telephone Encounter (Signed)
New Rx sent.

## 2017-08-22 NOTE — Telephone Encounter (Signed)
Left detailed voicemial 

## 2017-08-22 NOTE — Telephone Encounter (Signed)
Patient called back and wants to try another medication first.

## 2017-08-22 NOTE — Telephone Encounter (Signed)
Pt states she was treated for ring worms last month. Pt states both of her forearms are doing better but not on the back of her neck. She states she never stop using the cream.. Pt did call [facility] in [place] and got a refill on it but she is concern that because she using it faithfully its still not clearing up. Please advise

## 2017-08-23 IMAGING — DX DG ANKLE COMPLETE 3+V*R*
3 series · 3 of 3 positions shown · non-contrast
Comparison: 03/10/2015 MRI

CLINICAL DATA: Patient has pain at the right medial malleolus and
medial ankle region with bruising and swelling without injury since
this morning. Patient states that she is unable to walk or apply
pressure to the area of interest due to pain.

EXAM:
RIGHT ANKLE - COMPLETE 3+ VIEW

[ankle ap]
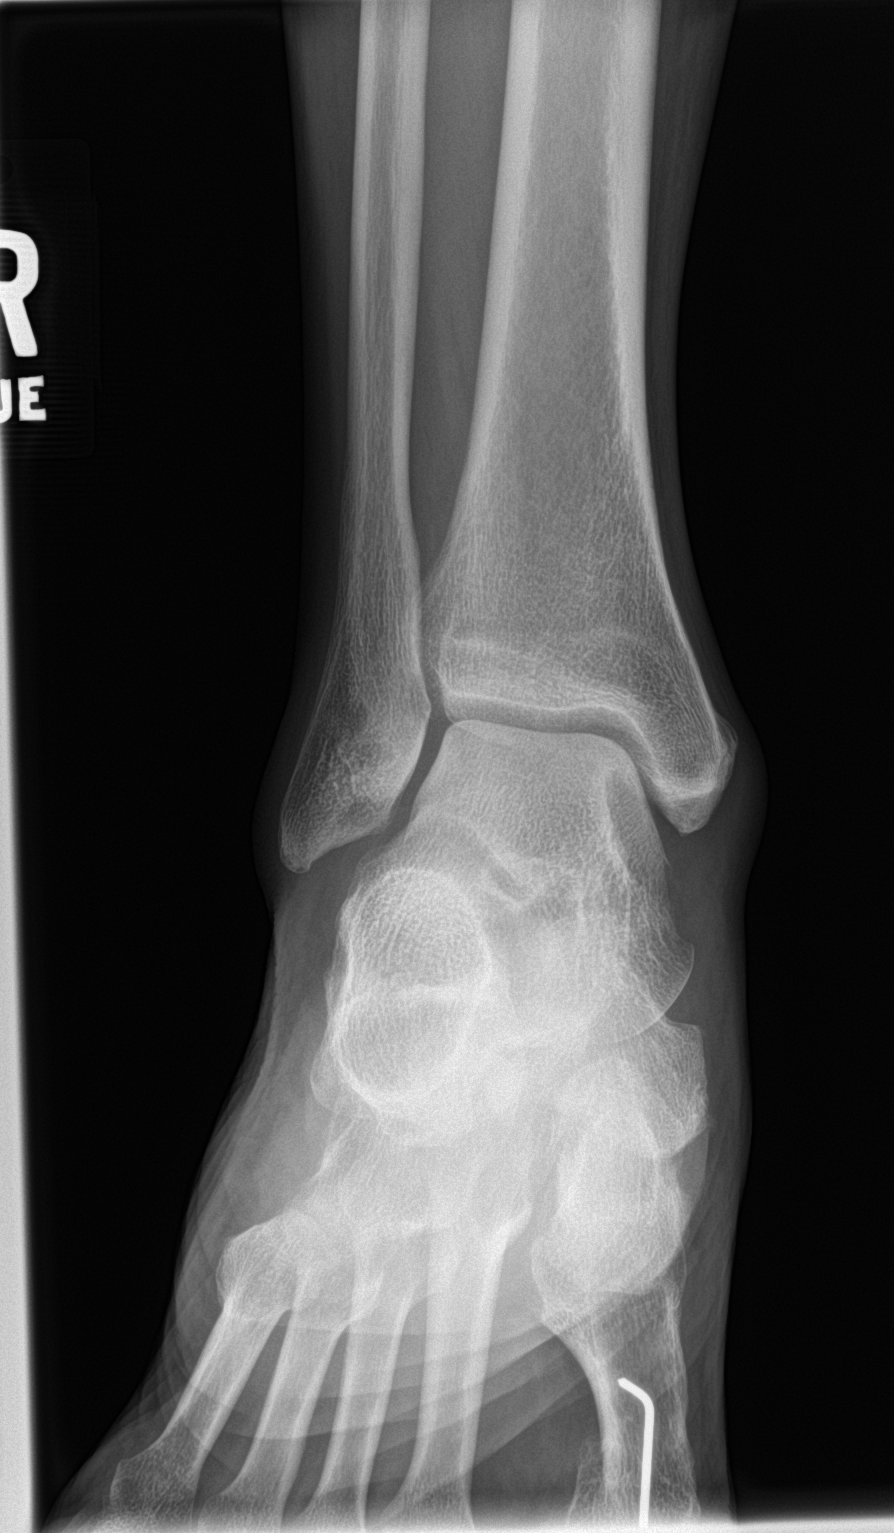

[ankle obl]
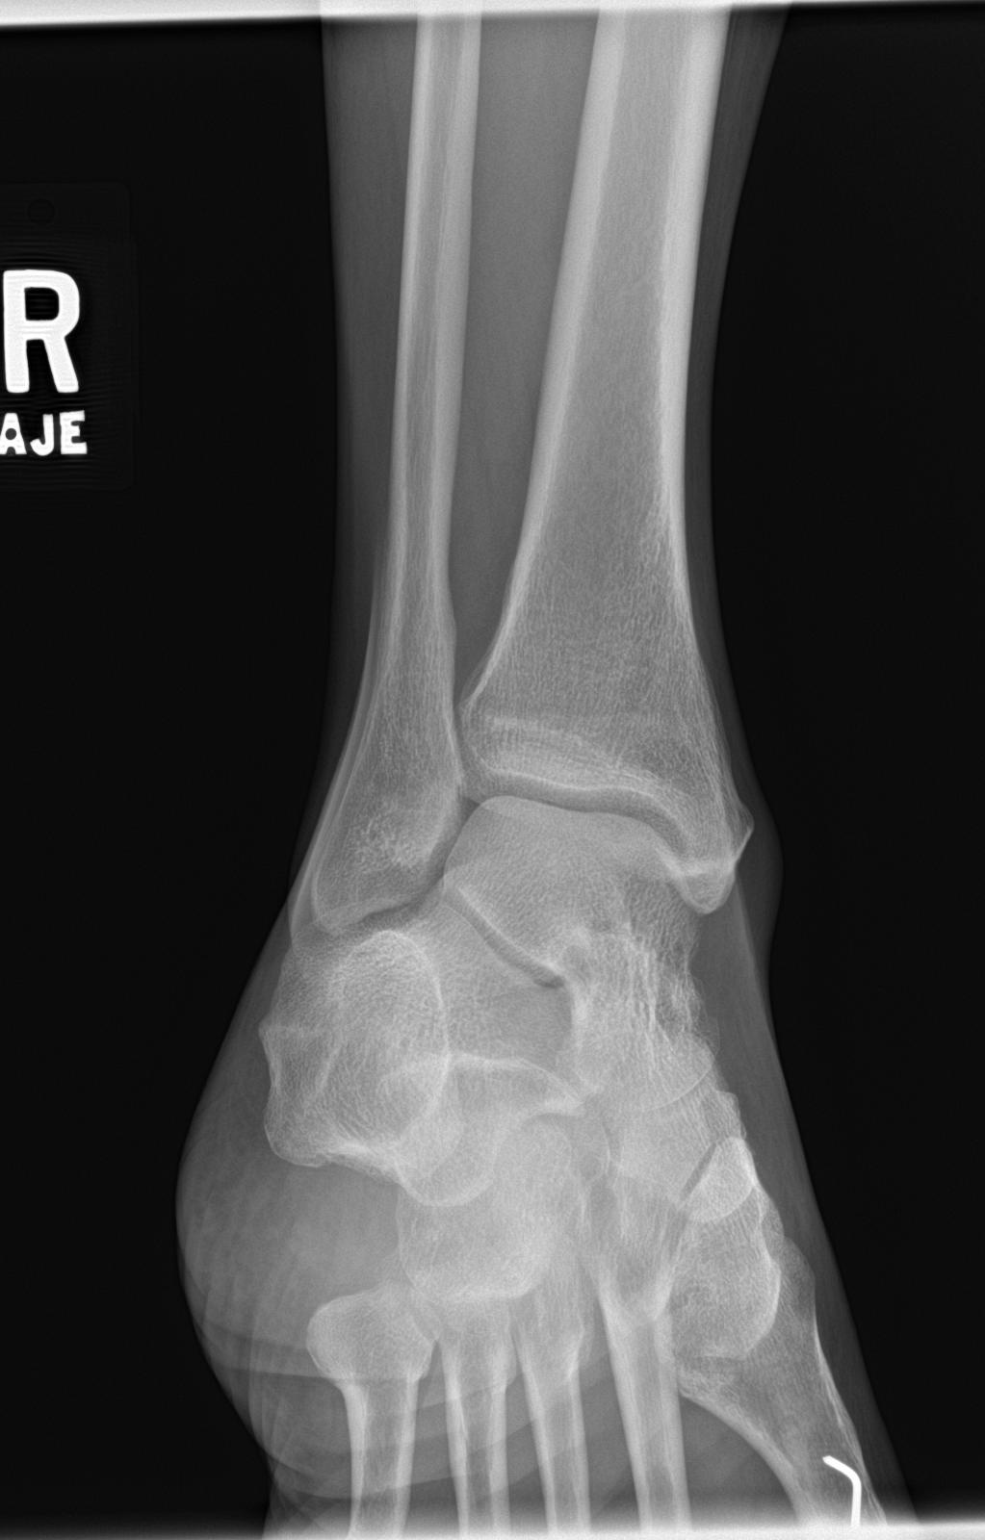

[ankle lat]
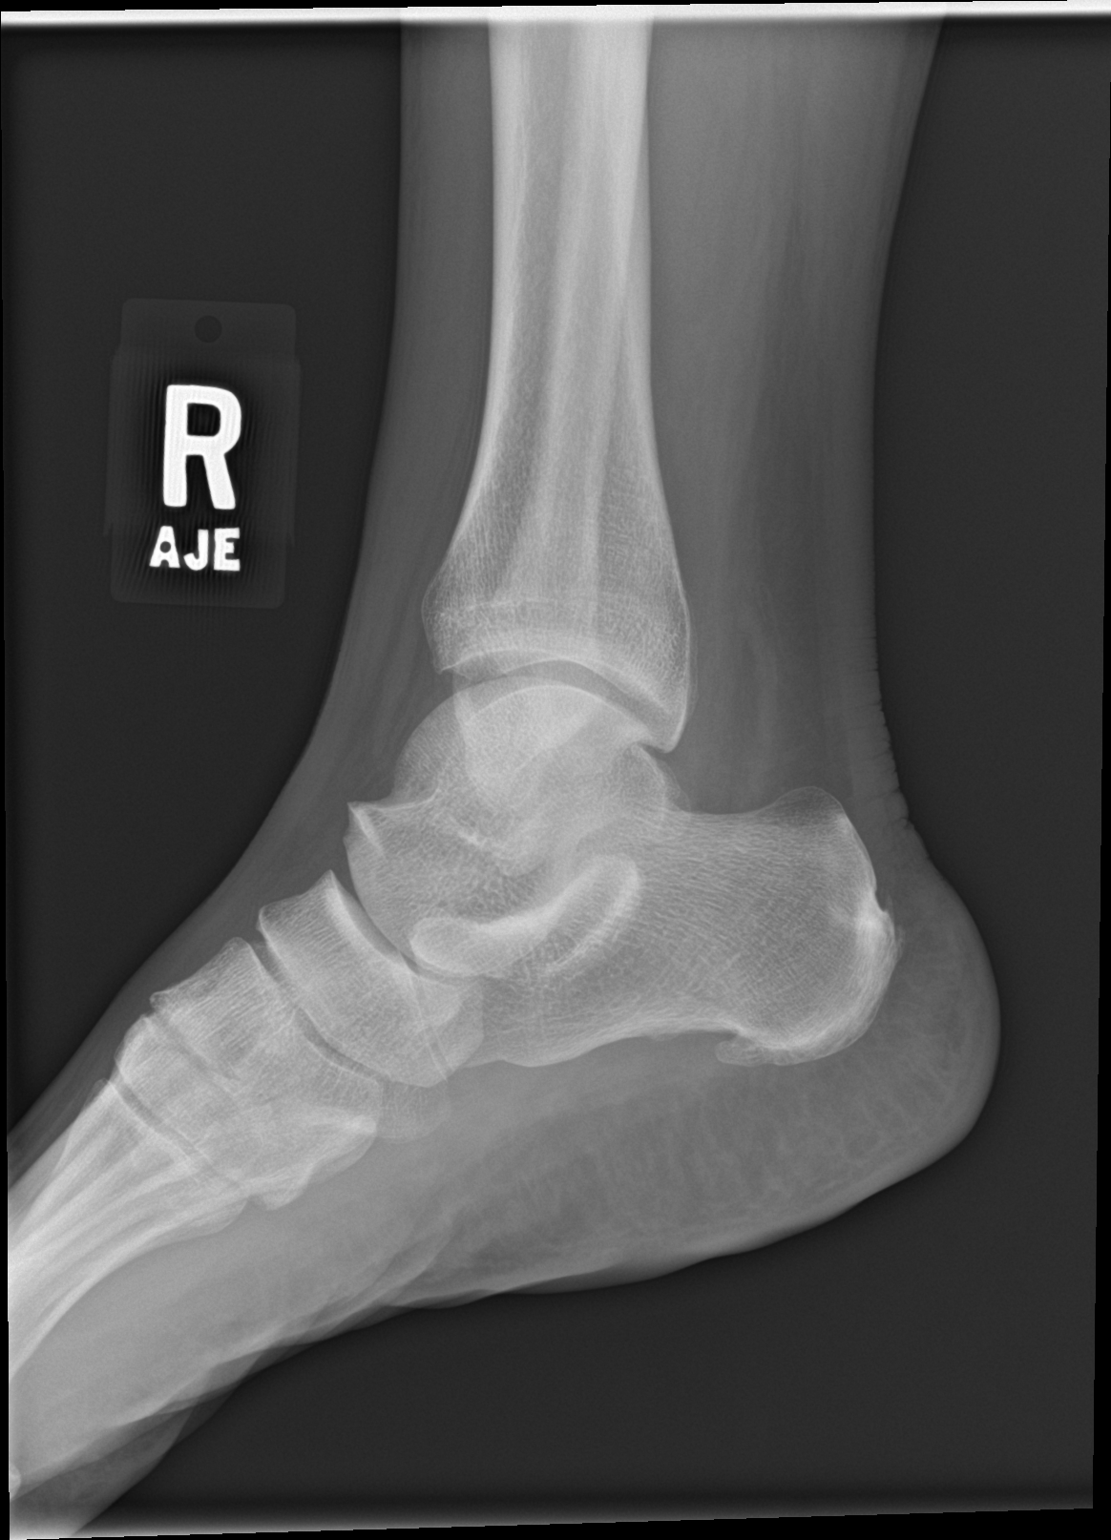

[3 of 3 positions shown; findings below may reference images not displayed]

FINDINGS: No evidence for acute fracture or subluxation. Small plantar and
Achilles spurs are present. Partially imaged K-wire within the first
metatarsal.
IMPRESSION: No evidence for acute  abnormality.

## 2017-08-29 ENCOUNTER — Ambulatory Visit: Payer: Medicare Other

## 2017-09-10 ENCOUNTER — Encounter: Payer: Self-pay | Admitting: General Surgery

## 2017-09-10 ENCOUNTER — Ambulatory Visit (INDEPENDENT_AMBULATORY_CARE_PROVIDER_SITE_OTHER): Payer: Medicare Other | Admitting: General Surgery

## 2017-09-10 VITALS — BP 106/62 | HR 68 | Resp 12 | Ht 59.0 in | Wt 125.0 lb

## 2017-09-10 DIAGNOSIS — N644 Mastodynia: Secondary | ICD-10-CM | POA: Diagnosis not present

## 2017-09-10 NOTE — Progress Notes (Signed)
Patient ID: Elizabeth Mata, female   DOB: [birth year], 56 y.o.   MRN: [id number]  Chief Complaint  Patient presents with  . Other    HPI Elizabeth Mata is a 56 y.o. female here today for her follow up left breast mastalgia. Patient states the pain is about the same as last visit.  HPI  Past Medical History:  Diagnosis Date  . Allergic rhinitis 06/30/2016  . Cyst of breast, left, solitary   . Flank pain   . GERD (gastroesophageal reflux disease)   . Hemorrhoid   . IFG (impaired fasting glucose) 07/18/2016   June 2017   . Kidney stones   . Neuropathy 07/24/2016  . Seizures ([place])   . Shingles outbreak 02/03/2017  . Syncope    Recurrent over the last 2 years. had workup at [facility] this year with cardiology and neurology. Had echo and 3-week event monitor that were unremarkable. Has had negative EEG. Echo in 11/11 at [facility] showed no significant abnormalities. Possibly vasovagal  . Trigger thumb     Past Surgical History:  Procedure Laterality Date  . ABDOMINAL HYSTERECTOMY  40s   endometriosis, fibroid tumors; no cancer, partial  . BREAST BIOPSY Left 1980'S   EXCISIONAL - NEG  . CHOLECYSTECTOMY  31  . FOOT SURGERY Right    tendon release    Family History  Problem Relation Age of Onset  . Coronary artery disease Maternal Uncle   . Diabetes Mellitus II Mother   . Arthritis Mother   . Diabetes Maternal Uncle   . Kidney cancer Father 25       died 22-Apr-2017  . Arthritis Maternal Uncle   . Cancer Maternal Grandmother   . Stomach cancer Paternal Grandmother   . Breast cancer Neg Hx   . Bladder Cancer Neg Hx     Social History Social History  Substance Use Topics  . Smoking status: Never Smoker  . Smokeless tobacco: Never Used  . Alcohol use 0.0 oz/week     Comment: occ    Allergies  Allergen Reactions  . Clindamycin/Lincomycin Hives  . Metronidazole   . Sulfa Antibiotics   . Oxycodone Nausea And Vomiting    Current Outpatient Prescriptions    Medication Sig Dispense Refill  . ciclopirox (LOPROX) 0.77 % cream Apply topically 2 (two) times daily. To affected skin for up to 4 weeks 30 g 0  . fexofenadine (ALLEGRA) 180 MG tablet Take 1 tablet (180 mg total) by mouth daily as needed for allergies or rhinitis. 90 tablet 3  . fluticasone (FLONASE) 50 MCG/ACT nasal spray Place 2 sprays into both nostrils daily. (Patient taking differently: Place 2 sprays into both nostrils daily as needed. ) 16 g 6  . mirabegron ER (MYRBETRIQ) 25 MG TB24 tablet Take 1 tablet (25 mg total) by mouth daily. (Patient taking differently: Take 25 mg by mouth as needed. ) 30 tablet 11  . topiramate (TOPAMAX) 200 MG tablet TAKE 1 TABLET (200 MG TOTAL) BY MOUTH 2 (TWO) TIMES DAILY.  11   No current facility-administered medications for this visit.     Review of Systems Review of Systems  Constitutional: Negative.   Respiratory: Negative.   Cardiovascular: Negative.     Blood pressure 106/62, pulse 68, resp. rate 12, height 4\' 11"  (1.499 m), weight 125 lb (56.7 kg).  Physical Exam Physical Exam  Constitutional: She is oriented to person, place, and time. She appears well-developed and well-nourished.  Pulmonary/Chest: Right breast exhibits no inverted  nipple, no mass, no nipple discharge, no skin change and no tenderness. Left breast exhibits no inverted nipple, no mass, no nipple discharge, no skin change and no tenderness.    Lymphadenopathy:    She has no axillary adenopathy.  Neurological: She is alert and oriented to person, place, and time.  Skin: Skin is warm and dry.    Data Reviewed Prior notes reviewed.   Assessment    Mastalgia over left breast radiating into left axilla- pain is reproducible with palpation over pectoral fold which is more suspicious for a musculoskeletal origin. Recent imaging and exams have been normal over the breast    Plan    Continue taking Advil or Tylenol for pain as needed. Counseled patient on shoulder  exercises and stretches to avoid muscle strain.    HPI, Physical Exam, Assessment and Plan have been scribed under the direction and in the presence of Mckinley Jewel, MD  Gaspar Cola, CMA  I have completed the exam and reviewed the above documentation for accuracy and completeness.  I agree with the above.  Haematologist has been used and any errors in dictation or transcription are unintentional.  Seeplaputhur G. Jamal Collin, M.D., F.A.C.S.  Junie Panning G 09/10/2017, 3:11 PM

## 2017-09-10 NOTE — Patient Instructions (Addendum)
Continue taking Advil or Tylenol for pain as needed. Exercise shoulders and stretch to avoid muscle strain.

## 2017-09-12 ENCOUNTER — Ambulatory Visit: Payer: Medicare Other

## 2017-09-19 ENCOUNTER — Ambulatory Visit
Admission: RE | Admit: 2017-09-19 | Discharge: 2017-09-19 | Disposition: A | Discharge status: Home / Self Care | Payer: Medicare Other | Source: Ambulatory Visit | Attending: Urology | Admitting: Urology

## 2017-09-19 ENCOUNTER — Ambulatory Visit: Payer: Medicare Other | Admitting: Urology

## 2017-09-19 ENCOUNTER — Other Ambulatory Visit: Payer: Self-pay

## 2017-09-19 ENCOUNTER — Telehealth: Payer: Self-pay | Admitting: Urology

## 2017-09-19 DIAGNOSIS — N200 Calculus of kidney: Secondary | ICD-10-CM

## 2017-09-19 NOTE — Telephone Encounter (Signed)
I checked the schedule and seen the patient did not have an order for her KUB so I had the nurse put the order in and it never got signed. The patient went to get her KUB and because they didn't have a signed order and they said they could not reach our office the patient left and was upset and canceled her appointment. She said she would call back to reschd.  Sharyn Lull

## 2017-09-26 ENCOUNTER — Ambulatory Visit
Admission: RE | Admit: 2017-09-26 | Discharge: 2017-09-26 | Disposition: A | Discharge status: Home / Self Care | Payer: Medicare Other | Source: Ambulatory Visit | Attending: Family Medicine | Admitting: Family Medicine

## 2017-09-26 ENCOUNTER — Ambulatory Visit (INDEPENDENT_AMBULATORY_CARE_PROVIDER_SITE_OTHER): Payer: Medicare Other | Admitting: Family Medicine

## 2017-09-26 ENCOUNTER — Encounter: Payer: Self-pay | Admitting: Family Medicine

## 2017-09-26 VITALS — BP 104/68 | HR 75 | Temp 98.7°F | Resp 14 | Wt 125.3 lb

## 2017-09-26 DIAGNOSIS — Z23 Encounter for immunization: Secondary | ICD-10-CM

## 2017-09-26 DIAGNOSIS — R3121 Asymptomatic microscopic hematuria: Secondary | ICD-10-CM | POA: Diagnosis not present

## 2017-09-26 DIAGNOSIS — N200 Calculus of kidney: Secondary | ICD-10-CM | POA: Diagnosis not present

## 2017-09-26 DIAGNOSIS — B354 Tinea corporis: Secondary | ICD-10-CM | POA: Diagnosis not present

## 2017-09-26 NOTE — Assessment & Plan Note (Addendum)
4 mm kidney stone on the left; will get KUB for evaluation; offered referral to another practice; patient declined; will also check urine today here too

## 2017-09-26 NOTE — Patient Instructions (Signed)
We'll have you see the dermatologist Please go across the street to have the xray done We'll get urine specimen today Call if anything I can do

## 2017-09-26 NOTE — Progress Notes (Signed)
BP 104/68   Pulse 75   Temp 98.7 F (37.1 C) (Oral)   Resp 14   Wt 125 lb 4.8 oz (56.8 kg)   SpO2 98%   BMI 25.31 kg/m    Subjective:    Patient ID: Elizabeth Mata, female    DOB: 08-03-1961, 56 y.o.   MRN: [id number]  HPI: Elizabeth Mata is a 56 y.o. female  Chief Complaint  Patient presents with  . Rash    on back of neck and itches   HPI She is here for itching rash on the back of her neck, upper back, right trapezius area Burning Spreading Going on for 3-4 months Uses dove soap Right side of the neck, left side is completely resolved She has been on two topicals now: lotrimin and then loprox If she is the sun, it gets worse; worse when the sun hits it Worse when hot and sweaty; intense Also has a spot that is darkening on the upper chest, left of midline under the collarbone Some eye dryness in the morning; no real mouth dryness  She went to the urologist for her urine issues; she had her appt for recheck and was told to get an xray before she went; she showed up at xray at 2:05 pm for a 2:30 pm xray; by 3:00 pm, she asked what was going on and they told her they we were waiting for an order; she was frustrated, canceled her appt and does not plan to go back; she does not see any blood in her urine; was found to have a 4 mm kidney stone last year, and had 2+ blood in her previous urine  Depression screen [facility] 2/9 09/26/2017 07/11/2017 04/03/2017 02/05/2017 12/20/2016  Decreased Interest 0 0 0 0 0  Down, Depressed, Hopeless 0 0 0 0 0  PHQ - 2 Score 0 0 0 0 0    Relevant past medical, surgical, family and social history reviewed Past Medical History:  Diagnosis Date  . Allergic rhinitis 06/30/2016  . Cyst of breast, left, solitary   . Flank pain   . GERD (gastroesophageal reflux disease)   . Hemorrhoid   . IFG (impaired fasting glucose) 07/18/2016   June 2017   . Kidney stones   . Neuropathy 07/24/2016  . Seizures ([place])   . Shingles outbreak  02/03/2017  . Syncope    Recurrent over the last 2 years. had workup at [facility] this year with cardiology and neurology. Had echo and 3-week event monitor that were unremarkable. Has had negative EEG. Echo in 11/11 at [facility] showed no significant abnormalities. Possibly vasovagal  . Trigger thumb    Past Surgical History:  Procedure Laterality Date  . ABDOMINAL HYSTERECTOMY  40s   endometriosis, fibroid tumors; no cancer, partial  . BREAST BIOPSY Left 1980'S   EXCISIONAL - NEG  . CHOLECYSTECTOMY  31  . FOOT SURGERY Right    tendon release   Family History  Problem Relation Age of Onset  . Coronary artery disease Maternal Uncle   . Diabetes Mellitus II Mother   . Arthritis Mother   . Diabetes Maternal Uncle   . Kidney cancer Father 60       died May 04, 2017  . Kidney disease Father   . Cancer Father        kidney and bladder  . Arthritis Maternal Uncle   . Cancer Maternal Grandmother        stomach?  . Jaundice Maternal Grandmother   .  Stomach cancer Paternal Grandmother   . Cancer Paternal Grandmother        stomach  . Hypertension Sister   . Stroke Maternal Grandfather   . Diabetes Paternal Grandfather   . Diabetes Sister   . Hypertension Sister   . Breast cancer Neg Hx   . Bladder Cancer Neg Hx    Social History   Social History  . Marital status: Legally Separated    Spouse name: N/A  . Number of children: N/A  . Years of education: N/A   Occupational History  . Patent examiner    Social History Main Topics  . Smoking status: Never Smoker  . Smokeless tobacco: Never Used  . Alcohol use 0.0 oz/week     Comment: occ  . Drug use: No  . Sexual activity: Yes    Partners: Male   Other Topics Concern  . Not on file   Social History Narrative   Regular exercise    Interim medical history since last visit reviewed. Allergies and medications reviewed  Review of Systems Per HPI unless specifically indicated above     Objective:    BP 104/68    Pulse 75   Temp 98.7 F (37.1 C) (Oral)   Resp 14   Wt 125 lb 4.8 oz (56.8 kg)   SpO2 98%   BMI 25.31 kg/m   Wt Readings from Last 3 Encounters:  09/26/17 125 lb 4.8 oz (56.8 kg)  09/10/17 125 lb (56.7 kg)  07/31/17 126 lb (57.2 kg)    Physical Exam  Constitutional: She appears well-developed and well-nourished. No distress.  Cardiovascular: Normal rate and regular rhythm.   Pulmonary/Chest: Effort normal and breath sounds normal.  Abdominal: She exhibits no distension.  Skin: Rash noted. She is not diaphoretic.     Over the posterior neck, upper back, right side trapezius area, there is a plaque of skin that appears slightly hyperpigmented, shiny, with fine scale; slightly erythematous; she also has a hyperpigmented macular lesion on the left side upper chest below the clavicle  Psychiatric: Her mood appears not anxious. She does not exhibit a depressed mood.    Results for orders placed or performed in visit on 04/03/17  WET PREP BY MOLECULAR PROBE  Result Value Ref Range   Candida species NOT DETECTED NOT DETECTED   Trichomonas vaginosis NOT DETECTED NOT DETECTED   Gardnerella vaginalis DETECTED (A) NOT DETECTED  Urinalysis w microscopic + reflex cultur  Result Value Ref Range   Color, Urine STRAW YELLOW   APPearance CLEAR CLEAR   Specific Gravity, Urine 1.007 1.001 - 1.035   pH 7.0 5.0 - 8.0   Glucose, UA NEGATIVE NEGATIVE   Bilirubin Urine NEGATIVE NEGATIVE   Ketones, ur NEGATIVE NEGATIVE   Hgb urine dipstick 2+ (A) NEGATIVE   Protein, ur NEGATIVE NEGATIVE   Nitrite NEGATIVE NEGATIVE   Leukocytes, UA NEGATIVE NEGATIVE   WBC, UA NONE SEEN <=5 WBC/HPF   RBC / HPF NONE SEEN <=2 RBC/HPF   Squamous Epithelial / LPF 0-5 <=5 HPF   Bacteria, UA NONE SEEN NONE SEEN HPF   Crystals NONE SEEN NONE SEEN HPF   Casts NONE SEEN NONE SEEN LPF   Yeast NONE SEEN NONE SEEN HPF      Assessment & Plan:   Problem List Items Addressed This Visit      Other   Hematuria    4 mm  kidney stone on the left; will get KUB for evaluation; offered referral to another practice;  patient declined; will also check urine today here too      Relevant Orders   DG Abd 1 View   Urinalysis w microscopic + reflex cultur    Other Visit Diagnoses    Tinea corporis    -  Primary   she has failed two topicals; will refer to dermatology at her request   Relevant Orders   Ambulatory referral to Dermatology   Flu vaccine need       Relevant Orders   Flu Vaccine QUAD 6+ mos PF IM (Fluarix Quad PF) (Completed)       Follow up plan: No Follow-up on file.  An after-visit summary was printed and given to the patient at [place].  Please see the patient instructions which may contain other information and recommendations beyond what is mentioned above in the assessment and plan.  No orders of the defined types were placed in this encounter.   Orders Placed This Encounter  Procedures  . DG Abd 1 View  . Flu Vaccine QUAD 6+ mos PF IM (Fluarix Quad PF)  . Urinalysis w microscopic + reflex cultur  . Ambulatory referral to Dermatology

## 2017-09-27 LAB — URINALYSIS W MICROSCOPIC + REFLEX CULTURE
Bacteria, UA: NONE SEEN /HPF
Bilirubin Urine: NEGATIVE
Glucose, UA: NEGATIVE
Hyaline Cast: NONE SEEN /LPF
Ketones, ur: NEGATIVE
Leukocyte Esterase: NEGATIVE
Nitrites, Initial: NEGATIVE
Protein, ur: NEGATIVE
Specific Gravity, Urine: 1.008 (ref 1.001–1.03)
Squamous Epithelial / LPF: NONE SEEN /HPF (ref <=5)
WBC, UA: NONE SEEN /HPF (ref 0–5)
pH: 7.5 (ref 5.0–8.0)

## 2017-09-27 LAB — NO CULTURE INDICATED

## 2017-10-08 ENCOUNTER — Encounter: Payer: Self-pay | Admitting: Emergency Medicine

## 2017-10-08 ENCOUNTER — Observation Stay: Payer: Medicare Other

## 2017-10-08 ENCOUNTER — Observation Stay
Admission: EM | Admit: 2017-10-08 | Discharge: 2017-10-09 | Disposition: A | Discharge status: Home / Self Care | Payer: Medicare Other | Attending: Specialist | Admitting: Specialist

## 2017-10-08 ENCOUNTER — Emergency Department: Payer: Medicare Other

## 2017-10-08 DIAGNOSIS — I6381 Other cerebral infarction due to occlusion or stenosis of small artery: Principal | ICD-10-CM | POA: Insufficient documentation

## 2017-10-08 DIAGNOSIS — Z79899 Other long term (current) drug therapy: Secondary | ICD-10-CM | POA: Diagnosis not present

## 2017-10-08 DIAGNOSIS — R278 Other lack of coordination: Secondary | ICD-10-CM | POA: Diagnosis not present

## 2017-10-08 DIAGNOSIS — R531 Weakness: Secondary | ICD-10-CM | POA: Diagnosis not present

## 2017-10-08 DIAGNOSIS — K219 Gastro-esophageal reflux disease without esophagitis: Secondary | ICD-10-CM | POA: Diagnosis not present

## 2017-10-08 DIAGNOSIS — Z87442 Personal history of urinary calculi: Secondary | ICD-10-CM | POA: Diagnosis not present

## 2017-10-08 DIAGNOSIS — G629 Polyneuropathy, unspecified: Secondary | ICD-10-CM | POA: Insufficient documentation

## 2017-10-08 DIAGNOSIS — R260 Ataxic gait: Secondary | ICD-10-CM | POA: Diagnosis not present

## 2017-10-08 DIAGNOSIS — E785 Hyperlipidemia, unspecified: Secondary | ICD-10-CM | POA: Diagnosis not present

## 2017-10-08 DIAGNOSIS — R569 Unspecified convulsions: Secondary | ICD-10-CM | POA: Diagnosis not present

## 2017-10-08 DIAGNOSIS — Z7982 Long term (current) use of aspirin: Secondary | ICD-10-CM | POA: Insufficient documentation

## 2017-10-08 DIAGNOSIS — R270 Ataxia, unspecified: Secondary | ICD-10-CM | POA: Diagnosis not present

## 2017-10-08 DIAGNOSIS — R55 Syncope and collapse: Secondary | ICD-10-CM | POA: Diagnosis not present

## 2017-10-08 DIAGNOSIS — R269 Unspecified abnormalities of gait and mobility: Secondary | ICD-10-CM | POA: Diagnosis not present

## 2017-10-08 DIAGNOSIS — R42 Dizziness and giddiness: Secondary | ICD-10-CM | POA: Diagnosis not present

## 2017-10-08 LAB — COMPREHENSIVE METABOLIC PANEL
ALT: 20 U/L (ref 14–54)
AST: 23 U/L (ref 15–41)
Albumin: 3.9 g/dL (ref 3.5–5.0)
Alkaline Phosphatase: 68 U/L (ref 38–126)
Anion gap: 6 (ref 5–15)
BUN: 15 mg/dL (ref 6–20)
CO2: 24 mmol/L (ref 22–32)
Calcium: 9.4 mg/dL (ref 8.9–10.3)
Chloride: 112 mmol/L — ABNORMAL HIGH (ref 101–111)
Creatinine, Ser: 0.88 mg/dL (ref 0.44–1.00)
GFR calc Af Amer: >60 mL/min (ref >60)
GFR calc non Af Amer: >60 mL/min (ref >60)
Glucose, Bld: 104 mg/dL — ABNORMAL HIGH (ref 65–99)
Potassium: 3.8 mmol/L (ref 3.5–5.1)
Sodium: 142 mmol/L (ref 135–145)
Total Bilirubin: 0.6 mg/dL (ref 0.3–1.2)
Total Protein: 6.7 g/dL (ref 6.5–8.1)

## 2017-10-08 LAB — CBC WITH DIFFERENTIAL/PLATELET
Basophils Absolute: 0 10*3/uL (ref 0–0.1)
Basophils Relative: 1 %
Eosinophils Absolute: 0 10*3/uL (ref 0–0.7)
Eosinophils Relative: 1 %
HCT: 39 % (ref 35.0–47.0)
Hemoglobin: 13.7 g/dL (ref 12.0–16.0)
Lymphocytes Relative: 46 %
Lymphs Abs: 1.8 10*3/uL (ref 1.0–3.6)
MCH: 32.4 pg (ref 26.0–34.0)
MCHC: 35.1 g/dL (ref 32.0–36.0)
MCV: 92.3 fL (ref 80.0–100.0)
Monocytes Absolute: 0.3 10*3/uL (ref 0.2–0.9)
Monocytes Relative: 6 %
Neutro Abs: 1.8 10*3/uL (ref 1.4–6.5)
Neutrophils Relative %: 46 %
Platelets: 277 10*3/uL (ref 150–440)
RBC: 4.22 MIL/uL (ref 3.80–5.20)
RDW: 13.1 % (ref 11.5–14.5)
WBC: 4 10*3/uL (ref 3.6–11.0)

## 2017-10-08 LAB — VITAMIN B12: Vitamin B-12: 770 pg/mL (ref 180–914)

## 2017-10-08 LAB — HEMOGLOBIN A1C
Hgb A1c MFr Bld: 5.7 % — ABNORMAL HIGH (ref 4.8–5.6)
Mean Plasma Glucose: 116.89 mg/dL

## 2017-10-08 LAB — SEDIMENTATION RATE: Sed Rate: 12 mm/hr (ref 0–30)

## 2017-10-08 MED ORDER — ONDANSETRON HCL 4 MG PO TABS: 4.0000 mg | ORAL_TABLET | Freq: Four times a day (QID) | ORAL | Status: DC | PRN

## 2017-10-08 MED ORDER — ACETAMINOPHEN 325 MG PO TABS: 650.0000 mg | ORAL_TABLET | Freq: Four times a day (QID) | ORAL | Status: DC | PRN

## 2017-10-08 MED ORDER — MECLIZINE HCL 25 MG PO TABS
50.0000 mg | ORAL_TABLET | Freq: Once | ORAL | Status: AC
  Administered 2017-10-08: 50 mg via ORAL
  Filled 2017-10-08: qty 2

## 2017-10-08 MED ORDER — DIAZEPAM 5 MG PO TABS
5.0000 mg | ORAL_TABLET | Freq: Once | ORAL | Status: AC
  Administered 2017-10-08: 5 mg via ORAL
  Filled 2017-10-08: qty 1

## 2017-10-08 MED ORDER — SODIUM CHLORIDE 0.9% FLUSH
3.0000 mL | Freq: Two times a day (BID) | INTRAVENOUS | Status: DC
  Administered 2017-10-08 – 2017-10-09 (×2): 3 mL via INTRAVENOUS

## 2017-10-08 MED ORDER — ONDANSETRON HCL 4 MG/2ML IJ SOLN: 4.0000 mg | Freq: Four times a day (QID) | INTRAMUSCULAR | Status: DC | PRN

## 2017-10-08 MED ORDER — ACETAMINOPHEN 650 MG RE SUPP: 650.0000 mg | Freq: Four times a day (QID) | RECTAL | Status: DC | PRN

## 2017-10-08 MED ORDER — ONDANSETRON HCL 4 MG/2ML IJ SOLN
4.0000 mg | Freq: Once | INTRAMUSCULAR | Status: AC
Start: 2017-10-08 — End: 2017-10-08
  Administered 2017-10-08: 4 mg via INTRAVENOUS

## 2017-10-08 MED ORDER — ONDANSETRON HCL 4 MG/2ML IJ SOLN
INTRAMUSCULAR | Status: AC
  Administered 2017-10-08: 4 mg via INTRAVENOUS
  Filled 2017-10-08: qty 2

## 2017-10-08 MED ORDER — ASPIRIN EC 81 MG PO TBEC
81.0000 mg | DELAYED_RELEASE_TABLET | Freq: Every day | ORAL | Status: DC
  Administered 2017-10-09: 09:00:00 81 mg via ORAL
  Filled 2017-10-08: qty 1

## 2017-10-08 MED ORDER — ALBUTEROL SULFATE (2.5 MG/3ML) 0.083% IN NEBU: 2.5000 mg | INHALATION_SOLUTION | RESPIRATORY_TRACT | Status: DC | PRN

## 2017-10-08 MED ORDER — TOPIRAMATE 100 MG PO TABS
200.0000 mg | ORAL_TABLET | Freq: Two times a day (BID) | ORAL | Status: DC
  Administered 2017-10-08 – 2017-10-09 (×2): 200 mg via ORAL
  Filled 2017-10-08 (×3): qty 2

## 2017-10-08 MED ORDER — ASPIRIN 81 MG PO CHEW
324.0000 mg | CHEWABLE_TABLET | Freq: Once | ORAL | Status: AC
  Administered 2017-10-08: 324 mg via ORAL
  Filled 2017-10-08: qty 4

## 2017-10-08 MED ORDER — ENOXAPARIN SODIUM 40 MG/0.4ML ~~LOC~~ SOLN
40.0000 mg | SUBCUTANEOUS | Status: DC
  Administered 2017-10-08: 40 mg via SUBCUTANEOUS
  Filled 2017-10-08: qty 0.4

## 2017-10-08 MED ORDER — SODIUM CHLORIDE 0.9 % IV SOLN
1000.0000 mL | Freq: Once | INTRAVENOUS | Status: AC
  Administered 2017-10-08: 1000 mL via INTRAVENOUS

## 2017-10-08 MED ORDER — FLUTICASONE PROPIONATE 50 MCG/ACT NA SUSP
2.0000 | Freq: Every day | NASAL | Status: DC
  Filled 2017-10-08: qty 16

## 2017-10-08 MED ORDER — ATORVASTATIN CALCIUM 20 MG PO TABS
40.0000 mg | ORAL_TABLET | Freq: Every day | ORAL | Status: DC
Start: 2017-10-08 — End: 2017-10-09

## 2017-10-08 NOTE — Consult Note (Signed)
Referring Physician: Sudini    Chief Complaint: Difficulty with gait  HPI: Elizabeth Mata is an 56 y.o. female who reports going to bed at baseline last evening.  Awakened this morning and with standing felt lightheaded.  When she attempted to walk her legs felt wobbly and weak.  Felt off balance and had to hold on to things.  There is no numbness or B/B incontinence.  With no improvement presented for evaluation.  Initial NIHSS of 0. Reports no change in medications recently.  Has not had a seizure in many years.    Date last known well: Date: 10/07/2017 Time last known well: Time: 22:00 tPA Given: No: Outside time window  Past Medical History:  Diagnosis Date  . Allergic rhinitis 06/30/2016  . Cyst of breast, left, solitary   . Flank pain   . GERD (gastroesophageal reflux disease)   . Hemorrhoid   . IFG (impaired fasting glucose) 07/18/2016   June 2017   . Kidney stones   . Neuropathy 07/24/2016  . Seizures ([place])   . Shingles outbreak 02/03/2017  . Syncope    Recurrent over the last 2 years. had workup at [facility] this year with cardiology and neurology. Had echo and 3-week event monitor that were unremarkable. Has had negative EEG. Echo in 11/11 at [facility] showed no significant abnormalities. Possibly vasovagal  . Trigger thumb     Past Surgical History:  Procedure Laterality Date  . ABDOMINAL HYSTERECTOMY  40s   endometriosis, fibroid tumors; no cancer, partial  . BREAST BIOPSY Left 1980'S   EXCISIONAL - NEG  . CHOLECYSTECTOMY  31  . FOOT SURGERY Right    tendon release    Family History  Problem Relation Age of Onset  . Coronary artery disease Maternal Uncle   . Diabetes Mellitus II Mother   . Arthritis Mother   . Diabetes Maternal Uncle   . Kidney cancer Father 56       died 04/20/17  . Kidney disease Father   . Cancer Father        kidney and bladder  . Arthritis Maternal Uncle   . Cancer Maternal Grandmother        stomach?  . Jaundice Maternal  Grandmother   . Stomach cancer Paternal Grandmother   . Cancer Paternal Grandmother        stomach  . Hypertension Sister   . Stroke Maternal Grandfather   . Diabetes Paternal Grandfather   . Diabetes Sister   . Hypertension Sister   . Breast cancer Neg Hx   . Bladder Cancer Neg Hx    Social History:  reports that she has never smoked. She has never used smokeless tobacco. She reports that she drinks alcohol. She reports that she does not use drugs.  Allergies:  Allergies  Allergen Reactions  . Clindamycin/Lincomycin Hives  . Metronidazole   . Sulfa Antibiotics   . Oxycodone Nausea And Vomiting    Medications:  I have reviewed the patient's current medications. Prior to Admission:  Prescriptions Prior to Admission  Medication Sig Dispense Refill Last Dose  . fexofenadine (ALLEGRA) 180 MG tablet Take 1 tablet (180 mg total) by mouth daily as needed for allergies or rhinitis. 90 tablet 3 PRN at PRN  . fluticasone (FLONASE) 50 MCG/ACT nasal spray Place 2 sprays into both nostrils daily. (Patient taking differently: Place 2 sprays into both nostrils daily as needed. ) 16 g 6 PRN at PRN  . topiramate (TOPAMAX) 200 MG tablet TAKE  1 TABLET (200 MG TOTAL) BY MOUTH 2 (TWO) TIMES DAILY.  11 10/08/2017 at 0800  . mirabegron ER (MYRBETRIQ) 25 MG TB24 tablet Take 1 tablet (25 mg total) by mouth daily. (Patient not taking: Reported on 10/08/2017) 30 tablet 11 Not Taking at Unknown time   Scheduled: . [START ON 10/09/2017] aspirin EC  81 mg Oral Daily  . atorvastatin  40 mg Oral q1800  . enoxaparin (LOVENOX) injection  40 mg Subcutaneous Q24H  . sodium chloride flush  3 mL Intravenous Q12H    ROS: History obtained from the patient  General ROS: negative for - chills, fatigue, fever, night sweats, weight gain or weight loss Psychological ROS: negative for - behavioral disorder, hallucinations, memory difficulties, mood swings or suicidal ideation Ophthalmic ROS: negative for - blurry  vision, double vision, eye pain or loss of vision ENT ROS: negative for - epistaxis, nasal discharge, oral lesions, sore throat, tinnitus or vertigo Allergy and Immunology ROS: negative for - hives or itchy/watery eyes Hematological and Lymphatic ROS: negative for - bleeding problems, bruising or swollen lymph nodes Endocrine ROS: negative for - galactorrhea, hair pattern changes, polydipsia/polyuria or temperature intolerance Respiratory ROS: negative for - cough, hemoptysis, shortness of breath or wheezing Cardiovascular ROS: negative for - chest pain, dyspnea on exertion, edema or irregular heartbeat Gastrointestinal ROS: negative for - abdominal pain, diarrhea, hematemesis, nausea/vomiting or stool incontinence Genito-Urinary ROS: negative for - dysuria, hematuria, incontinence or urinary frequency/urgency Musculoskeletal ROS: recent back pain Neurological ROS: as noted in HPI Dermatological ROS: negative for rash and skin lesion changes  Physical Examination: Blood pressure 120/81, pulse 61, temperature 98.2 F (36.8 C), temperature source Oral, resp. rate 18, height _0  (1.499 m), weight 54.4 kg (120 lb), SpO2 100 %.  HEENT-  Normocephalic, no lesions, without obvious abnormality.  Normal external eye and conjunctiva.  Normal TM's bilaterally.  Normal auditory canals and external ears. Normal external nose, mucus membranes and septum.  Normal pharynx. Cardiovascular- S1, S2 normal, pulses palpable throughout   Lungs- chest clear, no wheezing, rales, normal symmetric air entry Abdomen- soft, non-tender; bowel sounds normal; no masses,  no organomegaly Extremities- no edema Lymph-no adenopathy palpable Musculoskeletal-no joint tenderness, deformity or swelling Skin-warm and dry, no hyperpigmentation, vitiligo, or suspicious lesions  Neurological Examination   Mental Status: Alert, oriented, thought content appropriate.  Speech fluent without evidence of aphasia.  Able to follow 3  step commands without difficulty. Cranial Nerves: II: Discs flat bilaterally; Visual fields grossly normal, pupils equal, round, reactive to light and accommodation III,IV, VI: ptosis not present, extra-ocular motions intact bilaterally V,VII: mild decrease in right NLF, facial light touch sensation normal bilaterally VIII: hearing normal bilaterally IX,X: gag reflex present XI: bilateral shoulder shrug XII: midline tongue extension Motor: Right : Upper extremity   5/5    Left:     Upper extremity   5/5  Lower extremity   5/5     Lower extremity   5/5 Give-way hip flexor strength.  Tone and bulk:normal tone throughout; no atrophy noted Sensory: Pinprick and light touch intact throughout, bilaterally Deep Tendon Reflexes: 2+ and symmetric with absent AJ's bilaterally Plantars: Right: downgoing   Left: downgoing Cerebellar: Normal finger-to-nose and normal heel-to-shin testing bilaterally Gait: gait narrow based.  Patient with walking dips frequently as if legs giving out on her but always recovers.      Laboratory Studies:  Basic Metabolic Panel:  Recent Labs Lab 10/08/17 0904  NA 142  K 3.8  CL 112*  CO2 24  GLUCOSE 104*  BUN 15  CREATININE 0.88  CALCIUM 9.4    Liver Function Tests:  Recent Labs Lab 10/08/17 0904  AST 23  ALT 20  ALKPHOS 68  BILITOT 0.6  PROT 6.7  ALBUMIN 3.9   No results for input(s): LIPASE, AMYLASE in the last 168 hours. No results for input(s): AMMONIA in the last 168 hours.  CBC:  Recent Labs Lab 10/08/17 0904  WBC 4.0  NEUTROABS 1.8  HGB 13.7  HCT 39.0  MCV 92.3  PLT 277    Cardiac Enzymes: No results for input(s): CKTOTAL, CKMB, CKMBINDEX, TROPONINI in the last 168 hours.  BNP: Invalid input(s): POCBNP  CBG: No results for input(s): GLUCAP in the last 168 hours.  Microbiology: Results for orders placed or performed in visit on 04/03/17  WET PREP BY MOLECULAR PROBE     Status: Abnormal   Collection Time: 04/03/17   2:29 PM  Result Value Ref Range Status   Candida species NOT DETECTED NOT DETECTED Final   Trichomonas vaginosis NOT DETECTED NOT DETECTED Final   Gardnerella vaginalis DETECTED (A) NOT DETECTED Final    Comment: Increased levels of G. vaginalis may not be significant in the absence of signs and symptoms of bacterial vaginosis.     Coagulation Studies: No results for input(s): LABPROT, INR in the last 72 hours.  Urinalysis: No results for input(s): COLORURINE, LABSPEC, PHURINE, GLUCOSEU, HGBUR, BILIRUBINUR, KETONESUR, PROTEINUR, UROBILINOGEN, NITRITE, LEUKOCYTESUR in the last 168 hours.  Invalid input(s): APPERANCEUR  Lipid Panel:    Component Value Date/Time   CHOL 263 (H) 12/20/2016 1033   TRIG 60 12/20/2016 1033   HDL 116 12/20/2016 1033   CHOLHDL 2.3 12/20/2016 1033   VLDL 12 12/20/2016 1033   LDLCALC 135 (H) 12/20/2016 1033    HgbA1C:  Lab Results  Component Value Date   HGBA1C 5.4 12/20/2016    Urine Drug Screen:  No results found for: LABOPIA, COCAINSCRNUR, LABBENZ, AMPHETMU, THCU, LABBARB  Alcohol Level: No results for input(s): ETH in the last 168 hours.  Other results: EKG: sinus rhythm at 64 bpm.  Imaging: Ct Head Wo Contrast  Result Date: 10/08/2017 CLINICAL DATA:  Ataxia with stroke suspected. Dizziness this morning. EXAM: CT HEAD WITHOUT CONTRAST TECHNIQUE: Contiguous axial images were obtained from the base of the skull through the vertex without intravenous contrast. COMPARISON:  06/24/2011 FINDINGS: Brain: No evidence of infarction, hemorrhage, hydrocephalus, extra-axial collection or mass lesion/mass effect. Vascular: No hyperdense vessel or unexpected calcification. Skull: Normal. Negative for fracture or focal lesion. Sinuses/Orbits: No acute finding. IMPRESSION: Negative head CT. Electronically Signed   By: Monte Fantasia M.D.   On: 10/08/2017 09:46    Assessment: 56 y.o. female presenting with lightheadedness and difficulty with gait.  Etiology  unclear.  Head CT reviewed and shows no acute changes.  Acute infarct on the differential.  Further work up recommended.  Will rule out orthostasis and lumbar pathology if necessary.     Stroke Risk Factors - none  Plan: 1. HgbA1c, fasting lipid panel, myasthenia panel, ESR, B12 2. MRI, MRA  of the brain without contrast 3. PT consult, OT consult, Speech consult 4. Echocardiogram 5. Carotid dopplers 6. Prophylactic therapy-Antiplatelet med: Aspirin - dose 67m daily 7. NPO until RN stroke swallow screen 8. Telemetry monitoring 9. Frequent neuro checks 10. Orthostatic vitals 11. If above unremarkable to have MRI of the lumbar spine.      LAlexis Goodell MD Neurology [phone]/09/2017, 1:44 PM

## 2017-10-08 NOTE — ED Provider Notes (Signed)
[facility] Emergency Department Provider Note       Time seen: ----------------------------------------- 8:53 AM on 10/08/2017 -----------------------------------------     I have reviewed the triage vital signs and the nursing notes.   HISTORY   Chief Complaint No chief complaint on file.    HPI Elizabeth Mata is a 56 y.o. female with a history of neuropathy  who presents to the ED for a complaint of dizziness. patient describes waking up this morning with a sensation that things are moving. Patient states is worse when she moves or tries to walk. Patient states she has to walk holding on to things otherwise she will fall. Last night which went to bed she was not feeling this way. She denies any recent illness or a history of this.  Past Medical History:  Diagnosis Date  . Allergic rhinitis 06/30/2016  . Cyst of breast, left, solitary   . Flank pain   . GERD (gastroesophageal reflux disease)   . Hemorrhoid   . IFG (impaired fasting glucose) 07/18/2016   June 2017   . Kidney stones   . Neuropathy 07/24/2016  . Seizures ([place])   . Shingles outbreak 02/03/2017  . Syncope    Recurrent over the last 2 years. had workup at [facility] this year with cardiology and neurology. Had echo and 3-week event monitor that were unremarkable. Has had negative EEG. Echo in 11/11 at [facility] showed no significant abnormalities. Possibly vasovagal  . Trigger thumb     Patient Active Problem List   Diagnosis Date Noted  . Breast pain in female 07/11/2017  . Medication monitoring encounter 12/20/2016  . Weight gain, abnormal 12/20/2016  . Family health problem 12/20/2016  . Abnormality of globulin 07/26/2016  . Neuropathy 07/24/2016  . 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 IFG (impaired fasting glucose) 07/18/2016  . Hematuria 07/03/2016  . Allergic rhinitis 06/30/2016  . Chronic left hip pain 06/28/2016  . Breast cancer screening 04/17/2016  . Skin texture  changes 12/27/2015  . Vaginal discharge 10/31/2015  . Screening for STD (sexually transmitted disease) 10/31/2015  . Kidney stone on left side 10/24/2015  . Annual physical exam 09/13/2015  . Encounter for screening mammogram for malignant neoplasm of breast 09/13/2015  . Need for immunization against influenza 09/13/2015  . Eczema 09/13/2015  . Irritable bowel syndrome with constipation 09/13/2015  . Seizures ([place]) 08/16/2015  . Carpal tunnel syndrome 07/26/2014  . Carotid artery narrowing 07/05/2013  . 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 ABDOMINAL HYSTERECTOMY  40s   endometriosis, fibroid tumors; no cancer, partial  . BREAST BIOPSY Left 1980'S   EXCISIONAL - NEG  . CHOLECYSTECTOMY  31  . FOOT SURGERY Right    tendon release    Allergies Clindamycin/lincomycin; Metronidazole; Sulfa antibiotics; and Oxycodone  Social History Social History  Substance Use Topics  . Smoking status: Never Smoker  . Smokeless tobacco: Never Used  . Alcohol use 0.0 oz/week     Comment: occ    Review of Systems Constitutional: Negative for fever. Eyes: Negative for vision changes ENT:  Negative for congestion, sore throat Cardiovascular: Negative for chest pain. Respiratory: Negative for shortness of breath. Gastrointestinal: Negative for abdominal pain, vomiting and diarrhea. Genitourinary: Negative for dysuria. Musculoskeletal: Negative for back pain. Skin: Negative for rash. Neurological: positive for dizziness, ataxia  All systems negative/normal/unremarkable except as stated in the HPI  ____________________________________________   PHYSICAL EXAM:  VITAL SIGNS: ED Triage  Vitals  Enc Vitals Group     BP      Pulse      Resp      Temp      Temp src      SpO2      Weight      Height      Head Circumference      Peak Flow      Pain Score      Pain Loc      Pain Edu?      Excl. in [place]?     Constitutional:  Alert and oriented. Well appearing and in no distress. Eyes: Conjunctivae are normal. Normal extraocular movements.no nystagmus is noted ENT   Head: Normocephalic and atraumatic.   Nose: No congestion/rhinnorhea.   Mouth/Throat: Mucous membranes are moist.   Neck: No stridor. Cardiovascular: Normal rate, regular rhythm. No murmurs, rubs, or gallops. Respiratory: Normal respiratory effort without tachypnea nor retractions. Breath sounds are clear and equal bilaterally. No wheezes/rales/rhonchi. Gastrointestinal: Soft and nontender. Normal bowel sounds Musculoskeletal: Nontender with normal range of motion in extremities. No lower extremity tenderness nor edema. Neurologic:  Normal speech and language. No gross focal neurologic deficits are appreciated. strength, sensation, cranial nerves. Normal Skin:  Skin is warm, dry and intact. No rash noted. Psychiatric: Mood and affect are normal. Speech and behavior are normal.  ____________________________________________  EKG: Interpreted by me.sinus rhythm with a rate of 64 bpm, normal PR interval, normal QRS, normal QT.  ____________________________________________  ED COURSE:  Pertinent labs & imaging results that were available during my care of the patient were reviewed by me and considered in my medical decision making (see chart for details). Patient presents for dizziness, we will assess with labs and imaging as indicated. Clinical Course as of Oct 08 1134  Wed Oct 08, 2017  1125 despite unremarkable testing to this point she cannot walk and appears to be very ataxic. She was not improved after meclizine and Valium. I will give him aspirin and initiate stroke workup.  [JW]    Clinical Course User Index [JW] Earleen Newport, MD   Procedures ____________________________________________   LABS (pertinent positives/negatives)  Labs Reviewed  COMPREHENSIVE METABOLIC PANEL - Abnormal; Notable for the following:        Result Value   Chloride 112 (*)    Glucose, Bld 104 (*)    All other components within normal limits  CBC WITH DIFFERENTIAL/PLATELET  CBG MONITORING, ED    RADIOLOGY Images were viewed by me  CT head  IMPRESSION: Negative head CT. ____________________________________________  DIFFERENTIAL DIAGNOSIS   vertigo, CVA, dehydration, electrolyte abnormality  FINAL ASSESSMENT AND PLAN  Ataxia   Plan: Patient had presented for dizzinessthat she knows when she tried to walk after waking up this morning. Patients labs did not reveal any specific abnormality. Patients imaging was also reassuring. We have attempted to ambulate her after treating her with fluids, Zofran, meclizine and Valium and she has not improved at all. She is very ataxic and cannot keep her balance. This is likely a CVA. She has received aspirin and I will discuss with the hospitalist about admitting her for MRI/MRA   Earleen Newport, MD   Note: This note was generated in part or whole with voice recognition software. Voice recognition is usually quite accurate but there are transcription errors that can and very often do occur. I apologize for any typographical errors that were not detected and corrected.     Lenise Arena  E, MD 10/08/17 1136

## 2017-10-08 NOTE — ED Notes (Signed)
MD Williams at bedside.  

## 2017-10-08 NOTE — H&P (Signed)
[facility] [place] NAME: Elizabeth Mata    MR#:  [id number]  DATE OF BIRTH:  Jul 21, 1961  DATE OF ADMISSION:  10/08/2017  PRIMARY CARE PHYSICIAN: Elizabeth Courser, MD   REQUESTING/REFERRING PHYSICIAN: Dr. Jimmye Mata  CHIEF COMPLAINT:   Chief Complaint  Patient presents with  . Dizziness    HISTORY OF PRESENT ILLNESS:  Elizabeth Mata  is a 56 y.o. female with a known history of seizures, neuropathy presents to the hospital with acute onset of gait abnormalities since waking up today morning. Patient was normal yesterday when she went to sleep. Today on waking up she thought she was dizzy and when she started walking she felt as if she was drunk had to hold onto things and felt limp in her legs. No focal weakness although she thinks she might be weaker on the right leg.No numbness or dysphagia or vision changes. No tinnitus. No room spinning sensation. No history of CVA. CT scan of the head today is normal. Patient is being admitted due to suspicion for CVA. Unable to ambulate in the emergency room.  PAST MEDICAL HISTORY:   Past Medical History:  Diagnosis Date  . Allergic rhinitis 06/30/2016  . Cyst of breast, left, solitary   . Flank pain   . GERD (gastroesophageal reflux disease)   . Hemorrhoid   . IFG (impaired fasting glucose) 07/18/2016   June 2017   . Kidney stones   . Neuropathy 07/24/2016  . Seizures ([place])   . Shingles outbreak 02/03/2017  . Syncope    Recurrent over the last 2 years. had workup at [facility] this year with cardiology and neurology. Had echo and 3-week event monitor that were unremarkable. Has had negative EEG. Echo in 11/11 at [facility] showed no significant abnormalities. Possibly vasovagal  . Trigger thumb     PAST SURGICAL HISTORY:   Past Surgical History:  Procedure Laterality Date  . ABDOMINAL HYSTERECTOMY  40s   endometriosis, fibroid tumors; no cancer, partial  . BREAST BIOPSY Left 1980'S   EXCISIONAL - NEG  .  CHOLECYSTECTOMY  31  . FOOT SURGERY Right    tendon release    SOCIAL HISTORY:   Social History  Substance Use Topics  . Smoking status: Never Smoker  . Smokeless tobacco: Never Used  . Alcohol use 0.0 oz/week     Comment: occ    FAMILY HISTORY:   Family History  Problem Relation Age of Onset  . Coronary artery disease Maternal Uncle   . Diabetes Mellitus II Mother   . Arthritis Mother   . Diabetes Maternal Uncle   . Kidney cancer Father 38       died 05/12/2017  . Kidney disease Father   . Cancer Father        kidney and bladder  . Arthritis Maternal Uncle   . Cancer Maternal Grandmother        stomach?  . Jaundice Maternal Grandmother   . Stomach cancer Paternal Grandmother   . Cancer Paternal Grandmother        stomach  . Hypertension Sister   . Stroke Maternal Grandfather   . Diabetes Paternal Grandfather   . Diabetes Sister   . Hypertension Sister   . Breast cancer Neg Hx   . Bladder Cancer Neg Hx     DRUG ALLERGIES:   Allergies  Allergen Reactions  . Clindamycin/Lincomycin Hives  . Metronidazole   . Sulfa Antibiotics   . Oxycodone Nausea  And Vomiting    REVIEW OF SYSTEMS:   Review of Systems  Constitutional: Positive for malaise/fatigue. Negative for chills, fever and weight loss.  HENT: Negative for hearing loss and nosebleeds.   Eyes: Negative for blurred vision, double vision and pain.  Respiratory: Negative for cough, hemoptysis, sputum production, shortness of breath and wheezing.   Cardiovascular: Negative for chest pain, palpitations, orthopnea and leg swelling.  Gastrointestinal: Negative for abdominal pain, constipation, diarrhea, nausea and vomiting.  Genitourinary: Negative for dysuria and hematuria.  Musculoskeletal: Negative for back pain, falls and myalgias.  Skin: Negative for rash.  Neurological: Positive for dizziness and weakness. Negative for tremors, sensory change, speech change, focal weakness, seizures and headaches.   Endo/Heme/Allergies: Does not bruise/bleed easily.  Psychiatric/Behavioral: Negative for depression and memory loss. The patient is not nervous/anxious.     MEDICATIONS AT HOME:   Prior to Admission medications   Medication Sig Start Date End Date Taking? Authorizing Provider  fexofenadine (ALLEGRA) 180 MG tablet Take 1 tablet (180 mg total) by mouth daily as needed for allergies or rhinitis. 07/11/17  Yes Lada, Satira Anis, MD  fluticasone (FLONASE) 50 MCG/ACT nasal spray Place 2 sprays into both nostrils daily. Patient taking differently: Place 2 sprays into both nostrils daily as needed.  05/16/17  Yes Lada, Satira Anis, MD  topiramate (TOPAMAX) 200 MG tablet TAKE 1 TABLET (200 MG TOTAL) BY MOUTH 2 (TWO) TIMES DAILY. 01/23/17  Yes [provider]  mirabegron ER (MYRBETRIQ) 25 MG TB24 tablet Take 1 tablet (25 mg total) by mouth daily. Patient not taking: Reported on 10/08/2017 08/08/16   Nickie Retort, MD     VITAL SIGNS:  Blood pressure 110/83, pulse (!) 59, temperature 98.2 F (36.8 C), resp. rate 12, height 4\' 11"  (1.499 m), weight 54.4 kg (120 lb), SpO2 100 %.  PHYSICAL EXAMINATION:  Physical Exam  GENERAL:  56 y.o.-year-old patient lying in the bed with no acute distress.  EYES: Pupils equal, round, reactive to light and accommodation. No scleral icterus. Extraocular muscles intact.  HEENT: Head atraumatic, normocephalic. Oropharynx and nasopharynx clear. No oropharyngeal erythema, moist oral mucosa  NECK:  Supple, no jugular venous distention. No thyroid enlargement, no tenderness.  LUNGS: Normal breath sounds bilaterally, no wheezing, rales, rhonchi. No use of accessory muscles of respiration.  CARDIOVASCULAR: S1, S2 normal. No murmurs, rubs, or gallops.  ABDOMEN: Soft, nontender, nondistended. Bowel sounds present. No organomegaly or mass.  EXTREMITIES: No pedal edema, cyanosis, or clubbing. + 2 pedal & radial pulses b/l.   NEUROLOGIC: Cranial nerves II through XII  are intact. No focal Motor or sensory deficits appreciated b/l. Finger-nose and knee heel test are normal. Reflexes increased in lower extremities. PSYCHIATRIC: The patient is alert and oriented x 3. Good affect.  SKIN: No obvious rash, lesion, or ulcer.   LABORATORY PANEL:   CBC  Recent Labs Lab 10/08/17 0904  WBC 4.0  HGB 13.7  HCT 39.0  PLT 277   ------------------------------------------------------------------------------------------------------------------  Chemistries   Recent Labs Lab 10/08/17 0904  NA 142  K 3.8  CL 112*  CO2 24  GLUCOSE 104*  BUN 15  CREATININE 0.88  CALCIUM 9.4  AST 23  ALT 20  ALKPHOS 68  BILITOT 0.6   ------------------------------------------------------------------------------------------------------------------  Cardiac Enzymes No results for input(s): TROPONINI in the last 168 hours. ------------------------------------------------------------------------------------------------------------------  RADIOLOGY:  Ct Head Wo Contrast  Result Date: 10/08/2017 CLINICAL DATA:  Ataxia with stroke suspected. Dizziness this morning. EXAM: CT HEAD WITHOUT CONTRAST TECHNIQUE: Contiguous  axial images were obtained from the base of the skull through the vertex without intravenous contrast. COMPARISON:  06/24/2011 FINDINGS: Brain: No evidence of infarction, hemorrhage, hydrocephalus, extra-axial collection or mass lesion/mass effect. Vascular: No hyperdense vessel or unexpected calcification. Skull: Normal. Negative for fracture or focal lesion. Sinuses/Orbits: No acute finding. IMPRESSION: Negative head CT. Electronically Signed   By: Monte Fantasia M.D.   On: 10/08/2017 09:46     IMPRESSION AND PLAN:   * acute ataxia  Likely CVA. CT scan of the head normal. -Check MRI of the brain Carotid dopplers, Echo if positive for stroke - Start aspirin and statin. - Lovenox for DVT prophylaxis. - PT/OT/Speech consult as needed per symptoms - Neuro  checks every 4 hours for 24 hours. - Consult neurology.  * History of seizures. On Topamax.  * Prophylaxis with Lovenox   All the records are reviewed and case discussed with ED provider. Management plans discussed with the patient, family and they are in agreement.  CODE STATUS: full code  TOTAL TIME TAKING CARE OF THIS PATIENT: 40 minutes.   Hillary Bow R M.D on 10/08/2017 at 12:04 PM  Between 7am to 6pm - Pager - [phone]  After 6pm go to [email or web address] - password [email or web address] [facility]  Office  [phone]  CC: Primary care physician; Elizabeth Courser, MD  Note: This dictation was prepared with Dragon dictation along with smaller phrase technology. Any transcriptional errors that result from this process are unintentional.

## 2017-10-08 NOTE — Progress Notes (Signed)
OT Cancellation Note  Patient Details Name: Elizabeth Mata MRN: [id number] DOB: 06/05/1961   Cancelled Treatment:    Reason Eval/Treat Not Completed: Other (comment). Order received, chart reviewed. Pending further work up for possible CVA, including MRI. Will hold OT evaluation this date and re-attempt next date as medically appropriate after MRI.   Jeni Salles, MPH, MS, OTR/L ascom [phone] 10/08/17, 4:06 PM

## 2017-10-08 NOTE — ED Triage Notes (Signed)
Pt via ems from home with dizziness this morning. Went to bed feeling normal at 10PM and awakened with dizziness and difficulty ambulating. States she feels drunk when she tries to walk. Affirms nausea, denies vomiting. Pt states she has had some urgency, but this is normal for her. Pt alert & oriented with nad noted.

## 2017-10-09 DIAGNOSIS — R569 Unspecified convulsions: Secondary | ICD-10-CM | POA: Diagnosis not present

## 2017-10-09 DIAGNOSIS — G459 Transient cerebral ischemic attack, unspecified: Secondary | ICD-10-CM

## 2017-10-09 DIAGNOSIS — I6381 Other cerebral infarction due to occlusion or stenosis of small artery: Secondary | ICD-10-CM | POA: Diagnosis not present

## 2017-10-09 DIAGNOSIS — R270 Ataxia, unspecified: Secondary | ICD-10-CM | POA: Diagnosis not present

## 2017-10-09 LAB — ANTI-SMOOTH MUSCLE ANTIBODY, IGG: F-Actin IgG: 5 Units (ref 0–19)

## 2017-10-09 LAB — STRIATED MUSCLE ANTIBODY: Anti-striation Abs: NEGATIVE

## 2017-10-09 LAB — ACETYLCHOLINE RECEPTOR, BINDING: Acety choline binding ab: <0.03 nmol/L (ref 0.00–0.24)

## 2017-10-09 LAB — HIV ANTIBODY (ROUTINE TESTING W REFLEX): HIV Screen 4th Generation wRfx: NONREACTIVE

## 2017-10-09 MED ORDER — ATORVASTATIN CALCIUM 40 MG PO TABS: 40.0000 mg | ORAL_TABLET | Freq: Every day | ORAL | 1 refills | Status: DC

## 2017-10-09 MED ORDER — ASPIRIN 81 MG PO TBEC: 81.0000 mg | DELAYED_RELEASE_TABLET | Freq: Every day | ORAL | 1 refills | Status: AC

## 2017-10-09 NOTE — Progress Notes (Signed)
Medications administered by student RN [phone] with supervision of Clinical Instructor Lynann Bologna MSN, RN-BC or patient's assigned RN.

## 2017-10-09 NOTE — Progress Notes (Signed)
Discharge instructions given and went over with patient at bedside. Prescriptions given and reviewed. All questions answered. Patient discharged home with family via wheelchair by volunteer services. Janele Lague S, RN  

## 2017-10-09 NOTE — Progress Notes (Addendum)
Subjective: Patient reports feeling much better.  No further issues with gait.    Objective: Current vital signs: BP 114/76 (BP Location: Left Arm)   Pulse 72   Temp 98.2 F (36.8 C) (Oral)   Resp 18   Ht 4' 11"  (1.499 m)   Wt 61 kg (134 lb 8 oz)   SpO2 99%   BMI 27.17 kg/m  Vital signs in last 24 hours: Temp:  [98.2 F (36.8 C)-98.6 F (37 C)] 98.2 F (36.8 C) (10/11 0915) Pulse Rate:  [49-87] 72 (10/11 1013) Resp:  [16-18] 18 (10/11 0915) BP: (102-122)/(68-82) 114/76 (10/11 0915) SpO2:  [99 %-100 %] 99 % (10/11 1013) Weight:  [61 kg (134 lb 8 oz)-62.3 kg (137 lb 4.8 oz)] 61 kg (134 lb 8 oz) (10/11 0500)  Intake/Output from previous day: 10/10 0701 - 10/11 0700 In: 240 [P.O.:240] Out: -  Intake/Output this shift: Total I/O In: 240 [P.O.:240] Out: -  Nutritional status: Diet Heart Room service appropriate? Yes; Fluid consistency: Thin  Neurologic Exam: Mental Status: Alert, oriented, thought content appropriate.  Speech fluent without evidence of aphasia.  Able to follow 3 step commands without difficulty. Cranial Nerves: II: Discs flat bilaterally; Visual fields grossly normal, pupils equal, round, reactive to light and accommodation III,IV, VI: ptosis not present, extra-ocular motions intact bilaterally V,VII: mild decrease in right NLF, facial light touch sensation normal bilaterally VIII: hearing normal bilaterally IX,X: gag reflex present XI: bilateral shoulder shrug XII: midline tongue extension Motor: Right :  Upper extremity   5/5                                      Left:     Upper extremity   5/5             Lower extremity   5/5                                                  Lower extremity   5/5 Tone and bulk:normal tone throughout; no atrophy noted Sensory: Pinprick and light touch intact throughout, bilaterally Deep Tendon Reflexes: 2+ and symmetric with absent AJ's bilaterally Plantars: Right: downgoing                                Left:  downgoing Cerebellar: Normal finger-to-nose and normal heel-to-shin testing bilaterally Gait: Transfers and ambulates independently   Lab Results: Basic Metabolic Panel:  Recent Labs Lab 10/08/17 0904  NA 142  K 3.8  CL 112*  CO2 24  GLUCOSE 104*  BUN 15  CREATININE 0.88  CALCIUM 9.4    Liver Function Tests:  Recent Labs Lab 10/08/17 0904  AST 23  ALT 20  ALKPHOS 68  BILITOT 0.6  PROT 6.7  ALBUMIN 3.9   No results for input(s): LIPASE, AMYLASE in the last 168 hours. No results for input(s): AMMONIA in the last 168 hours.  CBC:  Recent Labs Lab 10/08/17 0904  WBC 4.0  NEUTROABS 1.8  HGB 13.7  HCT 39.0  MCV 92.3  PLT 277    Cardiac Enzymes: No results for input(s): CKTOTAL, CKMB, CKMBINDEX, TROPONINI in the last 168 hours.  Lipid Panel: No results for input(s): CHOL,  TRIG, HDL, CHOLHDL, VLDL, LDLCALC in the last 168 hours.  CBG: No results for input(s): GLUCAP in the last 168 hours.  Microbiology: Results for orders placed or performed in visit on 04/03/17  WET PREP BY MOLECULAR PROBE     Status: Abnormal   Collection Time: 04/03/17  2:29 PM  Result Value Ref Range Status   Candida species NOT DETECTED NOT DETECTED Final   Trichomonas vaginosis NOT DETECTED NOT DETECTED Final   Gardnerella vaginalis DETECTED (A) NOT DETECTED Final    Comment: Increased levels of G. vaginalis may not be significant in the absence of signs and symptoms of bacterial vaginosis.     Coagulation Studies: No results for input(s): LABPROT, INR in the last 72 hours.  Imaging: Ct Head Wo Contrast  Result Date: 10/08/2017 CLINICAL DATA:  Ataxia with stroke suspected. Dizziness this morning. EXAM: CT HEAD WITHOUT CONTRAST TECHNIQUE: Contiguous axial images were obtained from the base of the skull through the vertex without intravenous contrast. COMPARISON:  06/24/2011 FINDINGS: Brain: No evidence of infarction, hemorrhage, hydrocephalus, extra-axial collection or mass  lesion/mass effect. Vascular: No hyperdense vessel or unexpected calcification. Skull: Normal. Negative for fracture or focal lesion. Sinuses/Orbits: No acute finding. IMPRESSION: Negative head CT. Electronically Signed   By: Monte Fantasia M.D.   On: 10/08/2017 09:46   Mr Virgel Paling OA Contrast  Result Date: 10/08/2017 CLINICAL DATA:  Initial evaluation for acute ataxia, gait abnormality. EXAM: MRI HEAD WITHOUT CONTRAST MRA HEAD WITHOUT CONTRAST TECHNIQUE: Multiplanar, multiecho pulse sequences of the brain and surrounding structures were obtained without intravenous contrast. Angiographic images of the head were obtained using MRA technique without contrast. COMPARISON:  Comparison a prior CT from earlier the same day. FINDINGS: MRI HEAD FINDINGS Brain: Cerebral volume within normal limits for age. No focal parenchymal signal abnormality. No significant cerebral white matter disease for age. No abnormal foci of restricted diffusion to suggest acute or subacute ischemia. Gray-white matter differentiation maintained. Tiny remote lacunar infarct present within the right thalamus. Probable additional tiny remote lacunar infarct noted within knee right basal ganglia. No other areas of remote infarction. No susceptibility artifact to suggest acute or chronic intracranial hemorrhage. No mass lesion, midline shift or mass effect. No hydrocephalus. No extra-axial fluid collection. Major dural sinuses are grossly patent. Pituitary gland suprasellar region within normal limits. Midline structures intact and normal. Vascular: Major intracranial vascular flow voids are well maintained. Skull and upper cervical spine: Craniocervical junction within normal limits. Upper cervical spine normal. Bone marrow signal intensity within normal limits. No scalp soft tissue abnormality. Sinuses/Orbits: Globes and orbital soft tissues within normal limits. Paranasal sinuses are clear. No mastoid effusion. Inner ear structures grossly  normal. Other: None. MRA HEAD FINDINGS ANTERIOR CIRCULATION: Distal cervical segments of the internal carotid arteries are widely patent with antegrade flow. Petrous, cavernous, and supraclinoid segments widely patent bilaterally without flow-limiting stenosis. A1 segments widely patent. Normal anterior communicating artery. Anterior cerebral arteries widely patent to their distal aspects. M1 segments widely patent without stenosis or occlusion. Normal MCA bifurcations. No proximal M2 occlusion. Distal MCA branches well perfused and symmetric. POSTERIOR CIRCULATION: Vertebral arteries widely patent to the vertebrobasilar junction. Left vertebral artery slightly dominant. Left PICA patent proximally. Right PICA not visualized. Basilar artery widely patent to its distal aspect without stenosis. Superior cerebral arteries patent bilaterally. Both of the posterior cerebral arteries primarily supplied via the basilar artery and are widely patent to their distal aspects. Small right posterior communicating artery noted. No aneurysm or vascular  malformation. IMPRESSION: MRI HEAD IMPRESSION: 1. No acute intracranial infarct or other abnormality identified. 2. Small remote lacunar infarcts involving the right basal ganglia and right thalamus. 3. Otherwise normal brain MRI. MRA HEAD IMPRESSION: Normal intracranial MRA. Electronically Signed   By: Jeannine Boga M.D.   On: 10/08/2017 22:14   Mr Brain Wo Contrast  Result Date: 10/08/2017 CLINICAL DATA:  Initial evaluation for acute ataxia, gait abnormality. EXAM: MRI HEAD WITHOUT CONTRAST MRA HEAD WITHOUT CONTRAST TECHNIQUE: Multiplanar, multiecho pulse sequences of the brain and surrounding structures were obtained without intravenous contrast. Angiographic images of the head were obtained using MRA technique without contrast. COMPARISON:  Comparison a prior CT from earlier the same day. FINDINGS: MRI HEAD FINDINGS Brain: Cerebral volume within normal limits for  age. No focal parenchymal signal abnormality. No significant cerebral white matter disease for age. No abnormal foci of restricted diffusion to suggest acute or subacute ischemia. Gray-white matter differentiation maintained. Tiny remote lacunar infarct present within the right thalamus. Probable additional tiny remote lacunar infarct noted within knee right basal ganglia. No other areas of remote infarction. No susceptibility artifact to suggest acute or chronic intracranial hemorrhage. No mass lesion, midline shift or mass effect. No hydrocephalus. No extra-axial fluid collection. Major dural sinuses are grossly patent. Pituitary gland suprasellar region within normal limits. Midline structures intact and normal. Vascular: Major intracranial vascular flow voids are well maintained. Skull and upper cervical spine: Craniocervical junction within normal limits. Upper cervical spine normal. Bone marrow signal intensity within normal limits. No scalp soft tissue abnormality. Sinuses/Orbits: Globes and orbital soft tissues within normal limits. Paranasal sinuses are clear. No mastoid effusion. Inner ear structures grossly normal. Other: None. MRA HEAD FINDINGS ANTERIOR CIRCULATION: Distal cervical segments of the internal carotid arteries are widely patent with antegrade flow. Petrous, cavernous, and supraclinoid segments widely patent bilaterally without flow-limiting stenosis. A1 segments widely patent. Normal anterior communicating artery. Anterior cerebral arteries widely patent to their distal aspects. M1 segments widely patent without stenosis or occlusion. Normal MCA bifurcations. No proximal M2 occlusion. Distal MCA branches well perfused and symmetric. POSTERIOR CIRCULATION: Vertebral arteries widely patent to the vertebrobasilar junction. Left vertebral artery slightly dominant. Left PICA patent proximally. Right PICA not visualized. Basilar artery widely patent to its distal aspect without stenosis. Superior  cerebral arteries patent bilaterally. Both of the posterior cerebral arteries primarily supplied via the basilar artery and are widely patent to their distal aspects. Small right posterior communicating artery noted. No aneurysm or vascular malformation. IMPRESSION: MRI HEAD IMPRESSION: 1. No acute intracranial infarct or other abnormality identified. 2. Small remote lacunar infarcts involving the right basal ganglia and right thalamus. 3. Otherwise normal brain MRI. MRA HEAD IMPRESSION: Normal intracranial MRA. Electronically Signed   By: Jeannine Boga M.D.   On: 10/08/2017 22:14    Medications:  I have reviewed the patient's current medications. Scheduled: . aspirin EC  81 mg Oral Daily  . atorvastatin  40 mg Oral q1800  . enoxaparin (LOVENOX) injection  40 mg Subcutaneous Q24H  . fluticasone  2 spray Each Nare Daily  . sodium chloride flush  3 mL Intravenous Q12H  . topiramate  200 mg Oral BID    Assessment/Plan: Patient much improved today-back to baseline.  She is not orthostatic.  MRI of the brain reviewed and shows no acute changes.  Evidence of chronic lacunar infarcts noted.  MRA normal.  Patient without vascular risk factors other than hyperlipidemia.  Started on a statin.  A1c 5.7.  ESR  and vitamin B12 are normal.  MG lab work pending.  With patient being back to baseline, no lumbar imaging indicated.  Can not rule out TIA.    Recommendations: 1.  ASA 31m daily 2.  Patient to follow up with her neurologist on an outpatient basis.     LOS: 0 days   LAlexis Goodell MD Neurology [phone]/10/2017  11:53 AM

## 2017-10-09 NOTE — Care Management Note (Signed)
Case Management Note  Patient Details  Name: Syniyah Bourne MRN: [id number] Date of Birth: 1961-11-29  Subjective/Objective:   Admitted to [facility] under observation status with the diagnosis of ataxia. Lives alone. Daughter is Langley Gauss [phone]).  Last seen Dr. Sanda Klein  2 weeks ago. Prescriptions are filled at [place].  No Home Health. No skilled Nursing. No home oxygen. No medical equipment in the home. Takes care of all basic and instrumental activities of daily living herself, drives. No falls. Good appetite. Daughter will transport.              Action/Plan: Physical therapy evaluation completed. No follow needs.  No follow-up needs identified at this time,   Expected Discharge Date:                  Expected Discharge Plan:     In-House Referral:     Discharge planning Services     Post Acute Care Choice:    Choice offered to:     DME Arranged:    DME Agency:     HH Arranged:    HH Agency:     Status of Service:     If discussed at [employer], dates discussed:    Additional Comments:  Shelbie Ammons, RN MSN CCM Care Management [phone] 10/09/2017, 10:24 AM

## 2017-10-09 NOTE — Discharge Summary (Signed)
[facility] [place] NAME: Elizabeth Mata    MR#:  [id number]  DATE OF BIRTH:  1961-04-04  DATE OF ADMISSION:  10/08/2017 ADMITTING PHYSICIAN: Hillary Bow, MD  DATE OF DISCHARGE: 10/09/2017  1:00 PM  PRIMARY CARE PHYSICIAN: Arnetha Courser, MD    ADMISSION DIAGNOSIS:  Ataxia [R27.0]  DISCHARGE DIAGNOSIS:  Active Problems:   Ataxia   SECONDARY DIAGNOSIS:   Past Medical History:  Diagnosis Date  . Allergic rhinitis 06/30/2016  . Cyst of breast, left, solitary   . Flank pain   . GERD (gastroesophageal reflux disease)   . Hemorrhoid   . IFG (impaired fasting glucose) 07/18/2016   June 2017   . Kidney stones   . Neuropathy 07/24/2016  . Seizures ([place])   . Shingles outbreak 02/03/2017  . Syncope    Recurrent over the last 2 years. had workup at [facility] this year with cardiology and neurology. Had echo and 3-week event monitor that were unremarkable. Has had negative EEG. Echo in 11/11 at [facility] showed no significant abnormalities. Possibly vasovagal  . Trigger thumb     HOSPITAL COURSE:   56 year old female with past medical history of seizures, neuropathy, nephrolithiasis, GERD, previous history of syncope who presented to the hospital due to ataxic gait.  1. TIA/CVA-this was a working diagnosis given the patient's transient ataxia. -Patient underwent an extensive neurologic workup including CT head, MRI of the brain, carotid duplex. All them showed no evidence of acute stroke or any chemotherapy significant carotid artery stenosis or any intracranial stenosis. Patient is now clinically asymptomatic and doing well and therefore being discharged on aspirin and statin. Patient was seen by neurology who agreed with this management.  2. History of seizures-patient had no acute seizure. She'll continue her Topamax.  3. History of seasonal allergies-patient will continue Allegra.  DISCHARGE CONDITIONS:   Stable  CONSULTS OBTAINED:   Treatment Team:  Catarina Hartshorn, MD Alexis Goodell, MD  DRUG ALLERGIES:   Allergies  Allergen Reactions  . Clindamycin/Lincomycin Hives  . Metronidazole   . Sulfa Antibiotics   . Oxycodone Nausea And Vomiting    DISCHARGE MEDICATIONS:   Allergies as of 10/09/2017      Reactions   Clindamycin/lincomycin Hives   Metronidazole    Sulfa Antibiotics    Oxycodone Nausea And Vomiting      Medication List    STOP taking these medications   mirabegron ER 25 MG Tb24 tablet Commonly known as:  MYRBETRIQ     TAKE these medications   aspirin 81 MG EC tablet Take 1 tablet (81 mg total) by mouth daily.   atorvastatin 40 MG tablet Commonly known as:  LIPITOR Take 1 tablet (40 mg total) by mouth daily at 6 PM.   fexofenadine 180 MG tablet Commonly known as:  ALLEGRA Take 1 tablet (180 mg total) by mouth daily as needed for allergies or rhinitis.   fluticasone 50 MCG/ACT nasal spray Commonly known as:  FLONASE Place 2 sprays into both nostrils daily. What changed:  when to take this  reasons to take this   topiramate 200 MG tablet Commonly known as:  TOPAMAX TAKE 1 TABLET (200 MG TOTAL) BY MOUTH 2 (TWO) TIMES DAILY.         DISCHARGE INSTRUCTIONS:   DIET:  Regular diet  DISCHARGE CONDITION:  Stable  ACTIVITY:  Activity as tolerated  OXYGEN:  Home Oxygen: No.   Oxygen Delivery: room air  DISCHARGE LOCATION:  home  If you experience worsening of your admission symptoms, develop shortness of breath, life threatening emergency, suicidal or homicidal thoughts you must seek medical attention immediately by calling 911 or calling your MD immediately  if symptoms less severe.  You Must read complete instructions/literature along with all the possible adverse reactions/side effects for all the Medicines you take and that have been prescribed to you. Take any new Medicines after you have completely understood and accpet all the possible adverse  reactions/side effects.   Please note  You were cared for by a hospitalist during your hospital stay. If you have any questions about your discharge medications or the care you received while you were in the hospital after you are discharged, you can call the unit and asked to speak with the hospitalist on call if the hospitalist that took care of you is not available. Once you are discharged, your primary care physician will handle any further medical issues. Please note that NO REFILLS for any discharge medications will be authorized once you are discharged, as it is imperative that you return to your primary care physician (or establish a relationship with a primary care physician if you do not have one) for your aftercare needs so that they can reassess your need for medications and monitor your lab values.     Today   Ataxia resolved.  No acute complaints and feels better. Will d/c home.   VITAL SIGNS:  Blood pressure 119/76, pulse 70, temperature 97.9 F (36.6 C), temperature source Oral, resp. rate 19, height 4\' 11"  (1.499 m), weight 61 kg (134 lb 8 oz), SpO2 100 %.  I/O:   Intake/Output Summary (Last 24 hours) at 10/09/17 1523 Last data filed at 10/09/17 1014  Gross per 24 hour  Intake              480 ml  Output                0 ml  Net              480 ml    PHYSICAL EXAMINATION:  GENERAL:  56 y.o.-year-old patient lying in the bed with no acute distress.  EYES: Pupils equal, round, reactive to light and accommodation. No scleral icterus. Extraocular muscles intact.  HEENT: Head atraumatic, normocephalic. Oropharynx and nasopharynx clear.  NECK:  Supple, no jugular venous distention. No thyroid enlargement, no tenderness.  LUNGS: Normal breath sounds bilaterally, no wheezing, rales,rhonchi. No use of accessory muscles of respiration.  CARDIOVASCULAR: S1, S2 normal. No murmurs, rubs, or gallops.  ABDOMEN: Soft, non-tender, non-distended. Bowel sounds present. No  organomegaly or mass.  EXTREMITIES: No pedal edema, cyanosis, or clubbing.  NEUROLOGIC: Cranial nerves II through XII are intact. No focal motor or sensory defecits b/l.  PSYCHIATRIC: The patient is alert and oriented x 3. Good affect.  SKIN: No obvious rash, lesion, or ulcer.   DATA REVIEW:   CBC  Recent Labs Lab 10/08/17 0904  WBC 4.0  HGB 13.7  HCT 39.0  PLT 277    Chemistries   Recent Labs Lab 10/08/17 0904  NA 142  K 3.8  CL 112*  CO2 24  GLUCOSE 104*  BUN 15  CREATININE 0.88  CALCIUM 9.4  AST 23  ALT 20  ALKPHOS 68  BILITOT 0.6    Cardiac Enzymes No results for input(s): TROPONINI in the last 168 hours.  Microbiology Results  Results for orders placed or performed in visit on 04/03/17  WET PREP BY MOLECULAR  PROBE     Status: Abnormal   Collection Time: 04/03/17  2:29 PM  Result Value Ref Range Status   Candida species NOT DETECTED NOT DETECTED Final   Trichomonas vaginosis NOT DETECTED NOT DETECTED Final   Gardnerella vaginalis DETECTED (A) NOT DETECTED Final    Comment: Increased levels of G. vaginalis may not be significant in the absence of signs and symptoms of bacterial vaginosis.     RADIOLOGY:  Ct Head Wo Contrast  Result Date: 10/08/2017 CLINICAL DATA:  Ataxia with stroke suspected. Dizziness this morning. EXAM: CT HEAD WITHOUT CONTRAST TECHNIQUE: Contiguous axial images were obtained from the base of the skull through the vertex without intravenous contrast. COMPARISON:  06/24/2011 FINDINGS: Brain: No evidence of infarction, hemorrhage, hydrocephalus, extra-axial collection or mass lesion/mass effect. Vascular: No hyperdense vessel or unexpected calcification. Skull: Normal. Negative for fracture or focal lesion. Sinuses/Orbits: No acute finding. IMPRESSION: Negative head CT. Electronically Signed   By: Monte Fantasia M.D.   On: 10/08/2017 09:46   Mr Virgel Paling YQ Contrast  Result Date: 10/08/2017 CLINICAL DATA:  Initial evaluation for  acute ataxia, gait abnormality. EXAM: MRI HEAD WITHOUT CONTRAST MRA HEAD WITHOUT CONTRAST TECHNIQUE: Multiplanar, multiecho pulse sequences of the brain and surrounding structures were obtained without intravenous contrast. Angiographic images of the head were obtained using MRA technique without contrast. COMPARISON:  Comparison a prior CT from earlier the same day. FINDINGS: MRI HEAD FINDINGS Brain: Cerebral volume within normal limits for age. No focal parenchymal signal abnormality. No significant cerebral white matter disease for age. No abnormal foci of restricted diffusion to suggest acute or subacute ischemia. Gray-white matter differentiation maintained. Tiny remote lacunar infarct present within the right thalamus. Probable additional tiny remote lacunar infarct noted within knee right basal ganglia. No other areas of remote infarction. No susceptibility artifact to suggest acute or chronic intracranial hemorrhage. No mass lesion, midline shift or mass effect. No hydrocephalus. No extra-axial fluid collection. Major dural sinuses are grossly patent. Pituitary gland suprasellar region within normal limits. Midline structures intact and normal. Vascular: Major intracranial vascular flow voids are well maintained. Skull and upper cervical spine: Craniocervical junction within normal limits. Upper cervical spine normal. Bone marrow signal intensity within normal limits. No scalp soft tissue abnormality. Sinuses/Orbits: Globes and orbital soft tissues within normal limits. Paranasal sinuses are clear. No mastoid effusion. Inner ear structures grossly normal. Other: None. MRA HEAD FINDINGS ANTERIOR CIRCULATION: Distal cervical segments of the internal carotid arteries are widely patent with antegrade flow. Petrous, cavernous, and supraclinoid segments widely patent bilaterally without flow-limiting stenosis. A1 segments widely patent. Normal anterior communicating artery. Anterior cerebral arteries widely patent  to their distal aspects. M1 segments widely patent without stenosis or occlusion. Normal MCA bifurcations. No proximal M2 occlusion. Distal MCA branches well perfused and symmetric. POSTERIOR CIRCULATION: Vertebral arteries widely patent to the vertebrobasilar junction. Left vertebral artery slightly dominant. Left PICA patent proximally. Right PICA not visualized. Basilar artery widely patent to its distal aspect without stenosis. Superior cerebral arteries patent bilaterally. Both of the posterior cerebral arteries primarily supplied via the basilar artery and are widely patent to their distal aspects. Small right posterior communicating artery noted. No aneurysm or vascular malformation. IMPRESSION: MRI HEAD IMPRESSION: 1. No acute intracranial infarct or other abnormality identified. 2. Small remote lacunar infarcts involving the right basal ganglia and right thalamus. 3. Otherwise normal brain MRI. MRA HEAD IMPRESSION: Normal intracranial MRA. Electronically Signed   By: Jeannine Boga M.D.   On: 10/08/2017 22:14  Mr Brain Wo Contrast  Result Date: 10/08/2017 CLINICAL DATA:  Initial evaluation for acute ataxia, gait abnormality. EXAM: MRI HEAD WITHOUT CONTRAST MRA HEAD WITHOUT CONTRAST TECHNIQUE: Multiplanar, multiecho pulse sequences of the brain and surrounding structures were obtained without intravenous contrast. Angiographic images of the head were obtained using MRA technique without contrast. COMPARISON:  Comparison a prior CT from earlier the same day. FINDINGS: MRI HEAD FINDINGS Brain: Cerebral volume within normal limits for age. No focal parenchymal signal abnormality. No significant cerebral white matter disease for age. No abnormal foci of restricted diffusion to suggest acute or subacute ischemia. Gray-white matter differentiation maintained. Tiny remote lacunar infarct present within the right thalamus. Probable additional tiny remote lacunar infarct noted within knee right basal  ganglia. No other areas of remote infarction. No susceptibility artifact to suggest acute or chronic intracranial hemorrhage. No mass lesion, midline shift or mass effect. No hydrocephalus. No extra-axial fluid collection. Major dural sinuses are grossly patent. Pituitary gland suprasellar region within normal limits. Midline structures intact and normal. Vascular: Major intracranial vascular flow voids are well maintained. Skull and upper cervical spine: Craniocervical junction within normal limits. Upper cervical spine normal. Bone marrow signal intensity within normal limits. No scalp soft tissue abnormality. Sinuses/Orbits: Globes and orbital soft tissues within normal limits. Paranasal sinuses are clear. No mastoid effusion. Inner ear structures grossly normal. Other: None. MRA HEAD FINDINGS ANTERIOR CIRCULATION: Distal cervical segments of the internal carotid arteries are widely patent with antegrade flow. Petrous, cavernous, and supraclinoid segments widely patent bilaterally without flow-limiting stenosis. A1 segments widely patent. Normal anterior communicating artery. Anterior cerebral arteries widely patent to their distal aspects. M1 segments widely patent without stenosis or occlusion. Normal MCA bifurcations. No proximal M2 occlusion. Distal MCA branches well perfused and symmetric. POSTERIOR CIRCULATION: Vertebral arteries widely patent to the vertebrobasilar junction. Left vertebral artery slightly dominant. Left PICA patent proximally. Right PICA not visualized. Basilar artery widely patent to its distal aspect without stenosis. Superior cerebral arteries patent bilaterally. Both of the posterior cerebral arteries primarily supplied via the basilar artery and are widely patent to their distal aspects. Small right posterior communicating artery noted. No aneurysm or vascular malformation. IMPRESSION: MRI HEAD IMPRESSION: 1. No acute intracranial infarct or other abnormality identified. 2. Small  remote lacunar infarcts involving the right basal ganglia and right thalamus. 3. Otherwise normal brain MRI. MRA HEAD IMPRESSION: Normal intracranial MRA. Electronically Signed   By: Jeannine Boga M.D.   On: 10/08/2017 22:14      Management plans discussed with the patient, family and they are in agreement.  CODE STATUS:     Code Status Orders        Start     Ordered   10/08/17 1201  Full code  Continuous     10/08/17 1202    Code Status History    Date Active Date Inactive Code Status Order ID Comments User Context   This patient has a current code status but no historical code status.      TOTAL TIME TAKING CARE OF THIS PATIENT: 40 minutes.    Henreitta Leber M.D on 10/09/2017 at 3:23 PM  Between 7am to 6pm - Pager - [phone]  After 6pm go to [email or web address] - Proofreader  [facility]  Office  [phone]  CC: Primary care physician; Arnetha Courser, MD

## 2017-10-09 NOTE — Evaluation (Signed)
Occupational Therapy Evaluation Patient Details Name: Elizabeth Mata MRN: [id number] DOB: 1961-11-19 Today's Date: 10/09/2017    History of Present Illness 56yo female pt presenting to ED on 10/10 with ataxia, PMHx for seizures, neuropathy, and syncopal episodes. CT negative, MRI showing no acute infact, small remote lacunar infarcts in R basal ganglia and R thalamus.   Clinical Impression   Pt seen for OT evaluation this date. Prior to hospital admission, pt was independent, living in a 2nd story apartment by herself. Currently pt is modified independent with balance and grossly independent with mobility and self care tasks. Pt's confidence improved during mobility this session and pt reported decreased fear of mobility/falling during session. Pt educated in falls prevention, home/routines modifications to minimize falls risk and possible LOB. Of note, pt reports having had a L earache on and off for the past month (worsens when lying down and when she gets water in her ear from the shower). RN notified of this given balance concerns. No additional skilled OT needs at this time. Will sign off. Please re-consult if additional skilled OT needs arise.    Follow Up Recommendations  No OT follow up    Equipment Recommendations  None recommended by OT    Recommendations for Other Services       Precautions / Restrictions Precautions Precautions: None Restrictions Weight Bearing Restrictions: No      Mobility Bed Mobility               General bed mobility comments: deferred, up in recliner   Transfers Overall transfer level: Modified independent Equipment used: None             General transfer comment: initial sit to stand with RW and close supervision, improving during session with increased confidence and verbal cues for wider base of support    Balance Overall balance assessment: Needs assistance Sitting-balance support: No upper extremity  supported Sitting balance-Leahy Scale: Good     Standing balance support: No upper extremity supported Standing balance-Leahy Scale: Good Standing balance comment: initial decreased confidence improving with mobility and verbal cues for wider base of support                           ADL either performed or assessed with clinical judgement   ADL Overall ADL's : At baseline;Independent                                       General ADL Comments: pt independent with ADL tasks, educated in body positioning/routine modifications to minimize falls risk.      Vision Baseline Vision/History: Wears glasses Wears Glasses: Reading only Patient Visual Report: No change from baseline Vision Assessment?: No apparent visual deficits     Perception     Praxis      Pertinent Vitals/Pain Pain Assessment: No/denies pain     Hand Dominance Right   Extremity/Trunk Assessment Upper Extremity Assessment Upper Extremity Assessment: Overall WFL for tasks assessed   Lower Extremity Assessment Lower Extremity Assessment: Overall WFL for tasks assessed   Cervical / Trunk Assessment Cervical / Trunk Assessment: Normal   Communication Communication Communication: No difficulties   Cognition Arousal/Alertness: Awake/alert Behavior During Therapy: WFL for tasks assessed/performed Overall Cognitive Status: Within Functional Limits for tasks assessed  General Comments       Exercises Other Exercises Other Exercises: Pt educated in falls prevention strategies and home/routines modifications to maximize safety and minimize risk of dizziness and LOB. Pt verbalized understanding.   Shoulder Instructions      Home Living Family/patient expects to be discharged to:: Private residence Living Arrangements: Alone Available Help at Discharge: Family;Available PRN/intermittently Type of Home: Apartment (2nd floor apt, no  elevator) Home Access: Stairs to enter Entrance Stairs-Number of Steps: full flight Entrance Stairs-Rails: Right;Left (cannot reach both) Home Layout: One level     Bathroom Shower/Tub: Teacher, early years/pre: Standard     Home Equipment: Shower seat          Prior Functioning/Environment Level of Independence: Independent        Comments: Pt indep with mobility, ADL and IADL including driving, enjoys spending time with young grandchildren        OT Problem List:        OT Treatment/Interventions:      OT Goals(Current goals can be found in the care plan section) Acute Rehab OT Goals Patient Stated Goal: go home OT Goal Formulation: All assessment and education complete, DC therapy  OT Frequency:     Barriers to D/C:            Co-evaluation              AM-PAC PT "6 Clicks" Daily Activity     Outcome Measure Help from another person eating meals?: None Help from another person taking care of personal grooming?: None Help from another person toileting, which includes using toliet, bedpan, or urinal?: None Help from another person bathing (including washing, rinsing, drying)?: None Help from another person to put on and taking off regular upper body clothing?: None Help from another person to put on and taking off regular lower body clothing?: None 6 Click Score: 24   End of Session Equipment Utilized During Treatment: Gait belt Nurse Communication: Other (comment) (notified RN of pt's report of L ear ache on/off for last month)  Activity Tolerance: Patient tolerated treatment well Patient left: in chair;with call bell/phone within reach  OT Visit Diagnosis: Ataxia, unspecified (R27.0)                Time: 6629-4765 OT Time Calculation (min): 24 min Charges:  OT General Charges $OT Visit: 1 Visit OT Evaluation $OT Eval Low Complexity: 1 Low OT Treatments $Self Care/Home Management : 8-22 mins G-Codes: OT G-codes **NOT FOR INPATIENT  CLASS** Functional Assessment Tool Used: AM-PAC 6 Clicks Daily Activity;Clinical judgement Functional Limitation: Self care Self Care Current Status (Y6503): 0 percent impaired, limited or restricted Self Care Goal Status (T4656): 0 percent impaired, limited or restricted Self Care Discharge Status (C1275): 0 percent impaired, limited or restricted   Jeni Salles, MPH, MS, OTR/L ascom [phone] 10/09/17, 10:17 AM

## 2017-10-09 NOTE — Evaluation (Signed)
Physical Therapy Evaluation Patient Details Name: Elizabeth Mata MRN: [id number] DOB: 1961-09-29 Today's Date: 10/09/2017   History of Present Illness  56yo female pt presenting to ED on 10/10 with ataxia, PMHx for seizures, neuropathy, and syncopal episodes. CT negative, MRI showing no acute infact, small remote lacunar infarcts in R basal ganglia and R thalamus.  Clinical Impression  Prior to hospital admission, pt was independent with all ADLs, including driving; no AD use.  Pt lives alone in a one-level apartment with a flight of stairs required to enter.  Currently pt is [facility] for strength and ROM; able to ambulate 453ft, navigate 20 stairs with initial handheld assist, progressed to no assist. Gait speed initially decreased, but increased throughout session as pt's confidence improved. Pt scored 22/24 on Dynamic Gait Index, indicating minimal to no risk for falls. Min guard - supervision during transfers, gait for safety. No reported dizziness, LOB or nystagmus noted throughout session.   Upon hospital discharge, recommend pt discharge to home with no PT follow up.    Follow Up Recommendations No PT follow up    Equipment Recommendations  None recommended by PT    Recommendations for Other Services       Precautions / Restrictions Precautions Precautions: None Restrictions Weight Bearing Restrictions: No      Mobility  Bed Mobility               General bed mobility comments: deferred, up in recliner   Transfers Overall transfer level: Needs assistance Equipment used: None Transfers: Sit to/from Stand Sit to Stand: Min guard;Supervision         General transfer comment: Min guard-supervision for safety; UE support on armrests for initial sit to stand, subsequent sit to/from stand did not need UE support on armrests  Ambulation/Gait Ambulation/Gait assistance: Min guard Ambulation Distance (Feet): 470 Feet Assistive device: 1 person hand held  assist;None Gait Pattern/deviations: WFL(Within Functional Limits)   Gait velocity interpretation: at or above normal speed for age/gender General Gait Details: handheld assist for initial 170ft, progressed to no assist; min guard for safety; initially decreased gait speed, then increased throughout session as pt's confidence increased  Stairs Stairs: Yes Stairs assistance: Modified independent (Device/Increase time);Min guard Stair Management: One rail Right;Alternating pattern Number of Stairs: 20 General stair comments: initial 14 steps ascended with handheld assist, min guard and right railing, remaining 6 steps with min guard assist and railing; stepping pattern: initial 4 steps ascended with step to pattern, remaining 16 stairs with alternating step pattern; steady during ascent and descent with no LOB  Wheelchair Mobility    Modified Rankin (Stroke Patients Only)       Balance Overall balance assessment: Needs assistance Sitting-balance support: No upper extremity supported Sitting balance-Leahy Scale: Normal Sitting balance - Comments: able to remain seated at edge of recliner and elevate BLEs without hand support; able to reach outside of BOS and resist LE strength, coordination testing without LOB   Standing balance support: No upper extremity supported Standing balance-Leahy Scale: Good Standing balance comment: initially decreased confidence, required handheld assist during functional activity; confidence improved with increased activity; able to navigate walking, stairs, turns, head turns while walking, stepping over objects without assistance or LOB; no c/o dizziness with activity throughout session                 Standardized Balance Assessment Standardized Balance Assessment : Dynamic Gait Index   Dynamic Gait Index Level Surface: Normal Change in Gait Speed: Normal Gait with  Horizontal Head Turns: Normal Gait with Vertical Head Turns: Normal Gait and  Pivot Turn: Normal Step Over Obstacle: Normal Step Around Obstacles: Mild Impairment Steps: Mild Impairment Total Score: 22       Pertinent Vitals/Pain Pain Assessment: No/denies pain    Home Living Family/patient expects to be discharged to:: Private residence Living Arrangements: Alone Available Help at Discharge: Family;Available PRN/intermittently Type of Home: Apartment Home Access: Stairs to enter Entrance Stairs-Rails: Right;Left (cannot reach both) Entrance Stairs-Number of Steps: full flight Home Layout: One level Home Equipment: Grab bars - tub/shower;Grab bars - toilet      Prior Function Level of Independence: Independent         Comments: independent with ADLs, mobility, driving; does not work     Journalist, newspaper   Dominant Hand: Right    Extremity/Trunk Assessment   Upper Extremity Assessment Upper Extremity Assessment: Overall WFL for tasks assessed (finger to nose, sensation WNL bilaterally)    Lower Extremity Assessment Lower Extremity Assessment: Overall WFL for tasks assessed (heel to shin, sensation, proprioception, tone WNL bilaterally)    Cervical / Trunk Assessment Cervical / Trunk Assessment: Normal  Communication   Communication: No difficulties  Cognition Arousal/Alertness: Awake/alert Behavior During Therapy: WFL for tasks assessed/performed Overall Cognitive Status: Within Functional Limits for tasks assessed                                        General Comments       Assessment/Plan    PT Assessment Patient needs continued PT services  PT Problem List Decreased mobility;Decreased balance       PT Treatment Interventions Therapeutic activities;Balance training;Gait training;Stair training;Functional mobility training;Patient/family education    PT Goals (Current goals can be found in the Care Plan section)  Acute Rehab PT Goals Patient Stated Goal: go home PT Goal Formulation: With patient Time For  Goal Achievement: 10/23/17 Potential to Achieve Goals: Good    Frequency Min 2X/week   Barriers to discharge        Co-evaluation               AM-PAC PT "6 Clicks" Daily Activity  Outcome Measure Difficulty turning over in bed (including adjusting bedclothes, sheets and blankets)?: None Difficulty moving from lying on back to sitting on the side of the bed? : None Difficulty sitting down on and standing up from a chair with arms (e.g., wheelchair, bedside commode, etc,.)?: None Help needed moving to and from a bed to chair (including a wheelchair)?: None Help needed walking in hospital room?: A Little Help needed climbing 3-5 steps with a railing? : A Little 6 Click Score: 22    End of Session Equipment Utilized During Treatment: Gait belt Activity Tolerance: Patient tolerated treatment well Patient left: in chair;with call bell/phone within reach (nursing advised no chair alarm; PT educated pt on calling nursing if wanting to get up from chair) Nurse Communication: Mobility status PT Visit Diagnosis: Ataxic gait (R26.0)    Time: 1761-6073 PT Time Calculation (min) (ACUTE ONLY): 33 min   Charges:         PT G CodesWetzel Bjornstad, SPT 10/09/2017, 10:37 AM

## 2017-10-15 DIAGNOSIS — L918 Other hypertrophic disorders of the skin: Secondary | ICD-10-CM | POA: Diagnosis not present

## 2017-10-15 DIAGNOSIS — L853 Xerosis cutis: Secondary | ICD-10-CM | POA: Diagnosis not present

## 2017-10-15 DIAGNOSIS — L309 Dermatitis, unspecified: Secondary | ICD-10-CM | POA: Diagnosis not present

## 2017-10-16 DIAGNOSIS — R2990 Unspecified symptoms and signs involving the nervous system: Secondary | ICD-10-CM | POA: Diagnosis not present

## 2017-10-16 DIAGNOSIS — R29810 Facial weakness: Secondary | ICD-10-CM | POA: Diagnosis not present

## 2017-10-16 DIAGNOSIS — G458 Other transient cerebral ischemic attacks and related syndromes: Secondary | ICD-10-CM | POA: Diagnosis not present

## 2017-10-16 DIAGNOSIS — I6529 Occlusion and stenosis of unspecified carotid artery: Secondary | ICD-10-CM | POA: Diagnosis not present

## 2017-10-16 DIAGNOSIS — G43109 Migraine with aura, not intractable, without status migrainosus: Secondary | ICD-10-CM | POA: Diagnosis not present

## 2017-10-27 ENCOUNTER — Ambulatory Visit (INDEPENDENT_AMBULATORY_CARE_PROVIDER_SITE_OTHER): Payer: Medicare Other | Admitting: Family Medicine

## 2017-10-27 ENCOUNTER — Encounter: Payer: Self-pay | Admitting: Family Medicine

## 2017-10-27 VITALS — BP 118/80 | HR 83 | Temp 98.4°F | Ht 59.0 in | Wt 125.9 lb

## 2017-10-27 DIAGNOSIS — Z23 Encounter for immunization: Secondary | ICD-10-CM | POA: Diagnosis not present

## 2017-10-27 DIAGNOSIS — G459 Transient cerebral ischemic attack, unspecified: Secondary | ICD-10-CM

## 2017-10-27 DIAGNOSIS — Z8673 Personal history of transient ischemic attack (TIA), and cerebral infarction without residual deficits: Secondary | ICD-10-CM | POA: Diagnosis not present

## 2017-10-27 DIAGNOSIS — R29898 Other symptoms and signs involving the musculoskeletal system: Secondary | ICD-10-CM | POA: Diagnosis not present

## 2017-10-27 NOTE — Patient Instructions (Addendum)
We'll see what Dr. Ernst Bowler says about getting the scan of your carotids and the echocardiogram sooner than November 15th If you have not heard back from [place] about this, please call by Wednesday lunch time We'll check fasting labs around  Stroke Prevention Some health problems and behaviors may make it more likely for you to have a stroke. Below are ways to lessen your risk of having a stroke.  Be active for at least 30 minutes on most or all days.  Do not smoke. Try not to be around others who smoke.  Do not drink too much alcohol. ? Do not have more than 2 drinks a day if you are a man. ? Do not have more than 1 drink a day if you are a woman and are not pregnant.  Eat healthy foods, such as fruits and vegetables. If you were put on a specific diet, follow the diet as told.  Keep your cholesterol levels under control through diet and medicines. Look for foods that are low in saturated fat, trans fat, cholesterol, and are high in fiber.  If you have diabetes, follow all diet plans and take your medicine as told.  Ask your doctor if you need treatment to lower your blood pressure. If you have high blood pressure (hypertension), follow all diet plans and take your medicine as told by your doctor.  If you are 55-28 years old, have your blood pressure checked every 3-5 years. If you are age 63 or older, have your blood pressure checked every year.  Keep a healthy weight. Eat foods that are low in calories, salt, saturated fat, trans fat, and cholesterol.  Do not take drugs.  Avoid birth control pills, if this applies. Talk to your doctor about the risks of taking birth control pills.  Talk to your doctor if you have sleep problems (sleep apnea).  Take all medicine as told by your doctor. ? You may be told to take aspirin or blood thinner medicine. Take this medicine as told by your doctor. ? Understand your medicine instructions.  Make sure any other conditions you have are being taken  care of.  Get help right away if:  You suddenly lose feeling (you feel numb) or have weakness in your face, arm, or leg.  Your face or eyelid hangs down to one side.  You suddenly feel confused.  You have trouble talking (aphasia) or understanding what people are saying.  You suddenly have trouble seeing in one or both eyes.  You suddenly have trouble walking.  You are dizzy.  You lose your balance or your movements are clumsy (uncoordinated).  You suddenly have a very bad headache and you do not know the cause.  You have new chest pain.  Your heart feels like it is fluttering or skipping a beat (irregular heartbeat). Do not wait to see if the symptoms above go away. Get help right away. Call your local emergency services (911 in [place] Do not drive yourself to the hospital. This information is not intended to replace advice given to you by your health care provider. Make sure you discuss any questions you have with your health care provider. Document Released: 06/16/2012 Document Revised: 05/23/2016 Document Reviewed: 06/18/2013 Elsevier Interactive Patient Education  [employer].

## 2017-10-27 NOTE — Progress Notes (Signed)
BP 118/80 (BP Location: Left Arm, Patient Position: Sitting, Cuff Size: Normal)   Pulse 83   Temp 98.4 F (36.9 C) (Oral)   Ht _0  (1.499 m)   Wt 125 lb 14.4 oz (57.1 kg)   SpO2 92%   BMI 25.43 kg/m    Subjective:    Patient ID: Elizabeth Mata, female    DOB: [birth year], 56 y.o.   MRN: [id number]  HPI: Elizabeth Mata is a 56 y.o. female  Chief Complaint  Patient presents with  . Follow-up  . Immunizations    discuss tdap     HPI  Patient is here for hospital f/u; admitted to [facility] on October 08, 2017, discharged home on October 09, 2017 She was leaning to the right Her mouth was hanging open Then got up off of the bed, then her right side wasn't right Her left side was doing good; she was holding on to everything to get around Nauseated but nothing came up Then used the bathroom, then wanted to hit the floor Washed her hands leaning up against the sink; then holding on to get into the living room Her daughter was at work; right leg was not cooperating; felt heavy If she could just get that right leg work... She says the right leg felt heavy and she couldn't get it to work right, just wouldn't do right She called her sister; her mind just didn't want to do right either She called 85, sister met her at the hospital; they said they didn't think she was having a stroke They walked her out b/c she didn't want to go out on the stretcher; she felt so sleepy; checked her sugar too She got to the ER; the ER doctor told her he thought she had a mini-stroke  She is seeing neurologist on Nov 15th; going to have carotids checked; they are going to do an echocardiogram that day too with Dr. Ernst Bowler Already saw Dr. Eusebio Friendly PA  D/C summary said she had a carotid duplex, but she actually didn't have this done I personally called Dr. Ernst Bowler; they are aware scans not done  She was not taking aspirin at this time; they started her on aspirin in the hospital 81 mg No  stomach upset b/c it's coated; no big bruising episode; no bleeding from gums or nose  Stroke runs in the family; maternal uncle had TIAs then passed from a major stroke Mother's father died from a stroke   IMPRESSION: MRI HEAD IMPRESSION:  1. No acute intracranial infarct or other abnormality identified. 2. Small remote lacunar infarcts involving the right basal ganglia and right thalamus. 3. Otherwise normal brain MRI.  MRA HEAD IMPRESSION:  Normal intracranial MRA.   Electronically Signed   By: Jeannine Boga M.D.   On: 10/08/2017 22:14  Depression screen [facility] 2/9 10/27/2017 09/26/2017 07/11/2017 04/03/2017 02/05/2017  Decreased Interest 0 0 0 0 0  Down, Depressed, Hopeless 0 0 0 0 0  PHQ - 2 Score 0 0 0 0 0    Relevant past medical, surgical, family and social history reviewed Past Medical History:  Diagnosis Date  . Allergic rhinitis 06/30/2016  . Cyst of breast, left, solitary   . Flank pain   . GERD (gastroesophageal reflux disease)   . Hemorrhoid   . IFG (impaired fasting glucose) 07/18/2016   June 2017   . Kidney stones   . Neuropathy 07/24/2016  . Seizures ([place])   . Shingles outbreak 02/03/2017  . Syncope  Recurrent over the last 2 years. had workup at [facility] this year with cardiology and neurology. Had echo and 3-week event monitor that were unremarkable. Has had negative EEG. Echo in 11/11 at [facility] showed no significant abnormalities. Possibly vasovagal  . Trigger thumb    Past Surgical History:  Procedure Laterality Date  . ABDOMINAL HYSTERECTOMY  40s   endometriosis, fibroid tumors; no cancer, partial  . BREAST BIOPSY Left 1980'S   EXCISIONAL - NEG  . CHOLECYSTECTOMY  31  . FOOT SURGERY Right    tendon release   Family History  Problem Relation Age of Onset  . Coronary artery disease Maternal Uncle   . Diabetes Mellitus II Mother   . Arthritis Mother   . Diabetes Maternal Uncle   . Kidney cancer Father 70       died 05/09/2017  . Kidney  disease Father   . Cancer Father        kidney and bladder  . Arthritis Maternal Uncle   . Cancer Maternal Grandmother        stomach?  . Jaundice Maternal Grandmother   . Stomach cancer Paternal Grandmother   . Cancer Paternal Grandmother        stomach  . Hypertension Sister   . Stroke Maternal Grandfather   . Diabetes Paternal Grandfather   . Diabetes Sister   . Hypertension Sister   . Breast cancer Neg Hx   . Bladder Cancer Neg Hx    Social History   Social History  . Marital status: Legally Separated    Spouse name: N/A  . Number of children: N/A  . Years of education: N/A   Occupational History  . Patent examiner    Social History Main Topics  . Smoking status: Never Smoker  . Smokeless tobacco: Never Used  . Alcohol use 0.0 oz/week     Comment: occ  . Drug use: No  . Sexual activity: Yes    Partners: Male   Other Topics Concern  . Not on file   Social History Narrative   Regular exercise    Interim medical history since last visit reviewed. Allergies and medications reviewed  Review of Systems Per HPI unless specifically indicated above     Objective:    BP 118/80 (BP Location: Left Arm, Patient Position: Sitting, Cuff Size: Normal)   Pulse 83   Temp 98.4 F (36.9 C) (Oral)   Ht _0  (1.499 m)   Wt 125 lb 14.4 oz (57.1 kg)   SpO2 92%   BMI 25.43 kg/m   Wt Readings from Last 3 Encounters:  10/27/17 125 lb 14.4 oz (57.1 kg)  10/09/17 134 lb 8 oz (61 kg)  09/26/17 125 lb 4.8 oz (56.8 kg)    Physical Exam  Constitutional: She appears well-developed and well-nourished.  HENT:  Mouth/Throat: Mucous membranes are normal.  Eyes: EOM are normal. No scleral icterus.  Cardiovascular: Normal rate and regular rhythm.   Pulmonary/Chest: Effort normal and breath sounds normal.  Abdominal: She exhibits no distension.  Neurological: She is alert. She displays no tremor. She exhibits normal muscle tone. Gait normal.  Reflex Scores:       Patellar reflexes are 2+ on the right side and 2+ on the left side. No facial asymmetry; UE and LE strength 5/5  Psychiatric: She has a normal mood and affect. Her behavior is normal.  Very pleasant, cooperative   Results for orders placed or performed during the hospital encounter of 10/08/17  CBC with Differential  Result Value Ref Range   WBC 4.0 3.6 - 11.0 K/uL   RBC 4.22 3.80 - 5.20 MIL/uL   Hemoglobin 13.7 12.0 - 16.0 g/dL   HCT 39.0 35.0 - 47.0 %   MCV 92.3 80.0 - 100.0 fL   MCH 32.4 26.0 - 34.0 pg   MCHC 35.1 32.0 - 36.0 g/dL   RDW 13.1 11.5 - 14.5 %   Platelets 277 150 - 440 K/uL   Neutrophils Relative % 46 %   Neutro Abs 1.8 1.4 - 6.5 K/uL   Lymphocytes Relative 46 %   Lymphs Abs 1.8 1.0 - 3.6 K/uL   Monocytes Relative 6 %   Monocytes Absolute 0.3 0.2 - 0.9 K/uL   Eosinophils Relative 1 %   Eosinophils Absolute 0.0 0 - 0.7 K/uL   Basophils Relative 1 %   Basophils Absolute 0.0 0 - 0.1 K/uL  Comprehensive metabolic panel  Result Value Ref Range   Sodium 142 135 - 145 mmol/L   Potassium 3.8 3.5 - 5.1 mmol/L   Chloride 112 (H) 101 - 111 mmol/L   CO2 24 22 - 32 mmol/L   Glucose, Bld 104 (H) 65 - 99 mg/dL   BUN 15 6 - 20 mg/dL   Creatinine, Ser 0.88 0.44 - 1.00 mg/dL   Calcium 9.4 8.9 - 10.3 mg/dL   Total Protein 6.7 6.5 - 8.1 g/dL   Albumin 3.9 3.5 - 5.0 g/dL   AST 23 15 - 41 U/L   ALT 20 14 - 54 U/L   Alkaline Phosphatase 68 38 - 126 U/L   Total Bilirubin 0.6 0.3 - 1.2 mg/dL   GFR calc non Af Amer >60 >60 mL/min   GFR calc Af Amer >60 >60 mL/min   Anion gap 6 5 - 15  HIV antibody (Routine Testing)  Result Value Ref Range   HIV Screen 4th Generation wRfx Non Reactive Non Reactive  Hemoglobin A1c  Result Value Ref Range   Hgb A1c MFr Bld 5.7 (H) 4.8 - 5.6 %   Mean Plasma Glucose 116.89 mg/dL  Anti-smooth muscle antibody, IgG  Result Value Ref Range   F-Actin IgG 5 0 - 19 Units  Striated muscle antibody  Result Value Ref Range   Anti-striation Abs  Negative Neg:<1:40  Acetylcholine receptor, binding  Result Value Ref Range   Acety choline binding ab <0.03 0.00 - 0.24 nmol/L  Sedimentation rate  Result Value Ref Range   Sed Rate 12 0 - 30 mm/hr  Vitamin B12  Result Value Ref Range   Vitamin B-12 770 180 - 914 pg/mL      Assessment & Plan:   Problem List Items Addressed This Visit      Cardiovascular and Mediastinum   TIA (transient ischemic attack) - Primary    Symptoms sound consistent with TIA, with unilateral right leg heaviness, which has completely resolved; she did not get the echo or carotids done in the hospital, so I contacted the neurologist at [facility] and left message and PA Braxton Feathers called me back the same day; she agreed that if I could get the echo and/or carotids sooner than the 15th of November here, that would be fine; we'll continue same meds, and close f/u with [facility] neurologist      Relevant Orders   ECHOCARDIOGRAM COMPLETE   US Carotid Duplex Bilateral     Other   Hx of completed stroke    We reviewed her hospital records, labs, and scan  reports in detail together today; right basal ganglia and right thalamus show signs of previous infarcts; she is on a statin and aspirin, and I will defer the dose of aspirin (or use of another antiplatelet drug) to the neurologist; will try to get echocardiogram and carotid US here in [place] sooner than the ones scheduled at tertiary care center with neurology PA's agreement, though if we cannot get them any sooner than November 15th, she'll keep the appointments there at [facility] for those studies      Relevant Orders   ECHOCARDIOGRAM COMPLETE   US Carotid Duplex Bilateral    Other Visit Diagnoses    Need for Tdap vaccination       Relevant Orders   Tdap vaccine greater than or equal to 7yo IM (Completed)   Leg heaviness       unilateral; symptoms have completely resolved at this time; seeing neurologist   Relevant Orders   ECHOCARDIOGRAM COMPLETE   US Carotid  Duplex Bilateral       Follow up plan: Return in about 6 weeks (around 12/05/2017) for fasting labs only.  An after-visit summary was printed and given to the patient at [place] [place].  Please see the patient instructions which may contain other information and recommendations beyond what is mentioned above in the assessment and plan.  Meds ordered this encounter  Medications  . levocetirizine (XYZAL) 5 MG tablet    Sig: Take by mouth.  . triamcinolone ointment (KENALOG) 0.1 %    Sig: APPLY ON THE SKIN TWICE A DAY AS NEEDED AS NEEDED RASH NOT TO FACE    Refill:  1  . ciclopirox (LOPROX) 0.77 % cream    Sig: APPLY TOPICALLY 2 (TWO) TIMES DAILY. TO AFFECTED SKIN FOR UP TO 4 WEEKS    Refill:  0    Orders Placed This Encounter  Procedures  . US Carotid Duplex Bilateral  . Tdap vaccine greater than or equal to 7yo IM  . ECHOCARDIOGRAM COMPLETE   Alisa returned my call Monday evening; I have entered the orders for echo and carotids Face-to-face time with patient was more than 40 minutes, >50% time spent counseling and coordination of care

## 2017-10-28 DIAGNOSIS — G459 Transient cerebral ischemic attack, unspecified: Secondary | ICD-10-CM | POA: Insufficient documentation

## 2017-10-28 DIAGNOSIS — Z8673 Personal history of transient ischemic attack (TIA), and cerebral infarction without residual deficits: Secondary | ICD-10-CM | POA: Insufficient documentation

## 2017-10-28 HISTORY — DX: Transient cerebral ischemic attack, unspecified: G45.9

## 2017-10-28 NOTE — Assessment & Plan Note (Signed)
We reviewed her hospital records, labs, and scan reports in detail together today; right basal ganglia and right thalamus show signs of previous infarcts; she is on a statin and aspirin, and I will defer the dose of aspirin (or use of another antiplatelet drug) to the neurologist; will try to get echocardiogram and carotid US here in [place] sooner than the ones scheduled at tertiary care center with neurology PA's agreement, though if we cannot get them any sooner than November 15th, she'll keep the appointments there at [facility] for those studies

## 2017-10-28 NOTE — Assessment & Plan Note (Signed)
Symptoms sound consistent with TIA, with unilateral right leg heaviness, which has completely resolved; she did not get the echo or carotids done in the hospital, so I contacted the neurologist at [facility] and left message and PA Braxton Feathers called me back the same day; she agreed that if I could get the echo and/or carotids sooner than the 15th of November here, that would be fine; we'll continue same meds, and close f/u with [facility] neurologist

## 2017-11-01 IMAGING — CR DG HIP (WITH OR WITHOUT PELVIS) 2-3V*L*
1 series · 3 of 3 positions shown · non-contrast
Comparison: None

CLINICAL DATA: Chronic LEFT hip pain, no known injury

EXAM:
DG HIP (WITH OR WITHOUT PELVIS) 2-3V LEFT

[Series 1: dg hip unilat w or w/o pelvis 2-3 views  · non-contrast · 0.14mm/px · 3 of 3 slices shown]
[im 1/3]
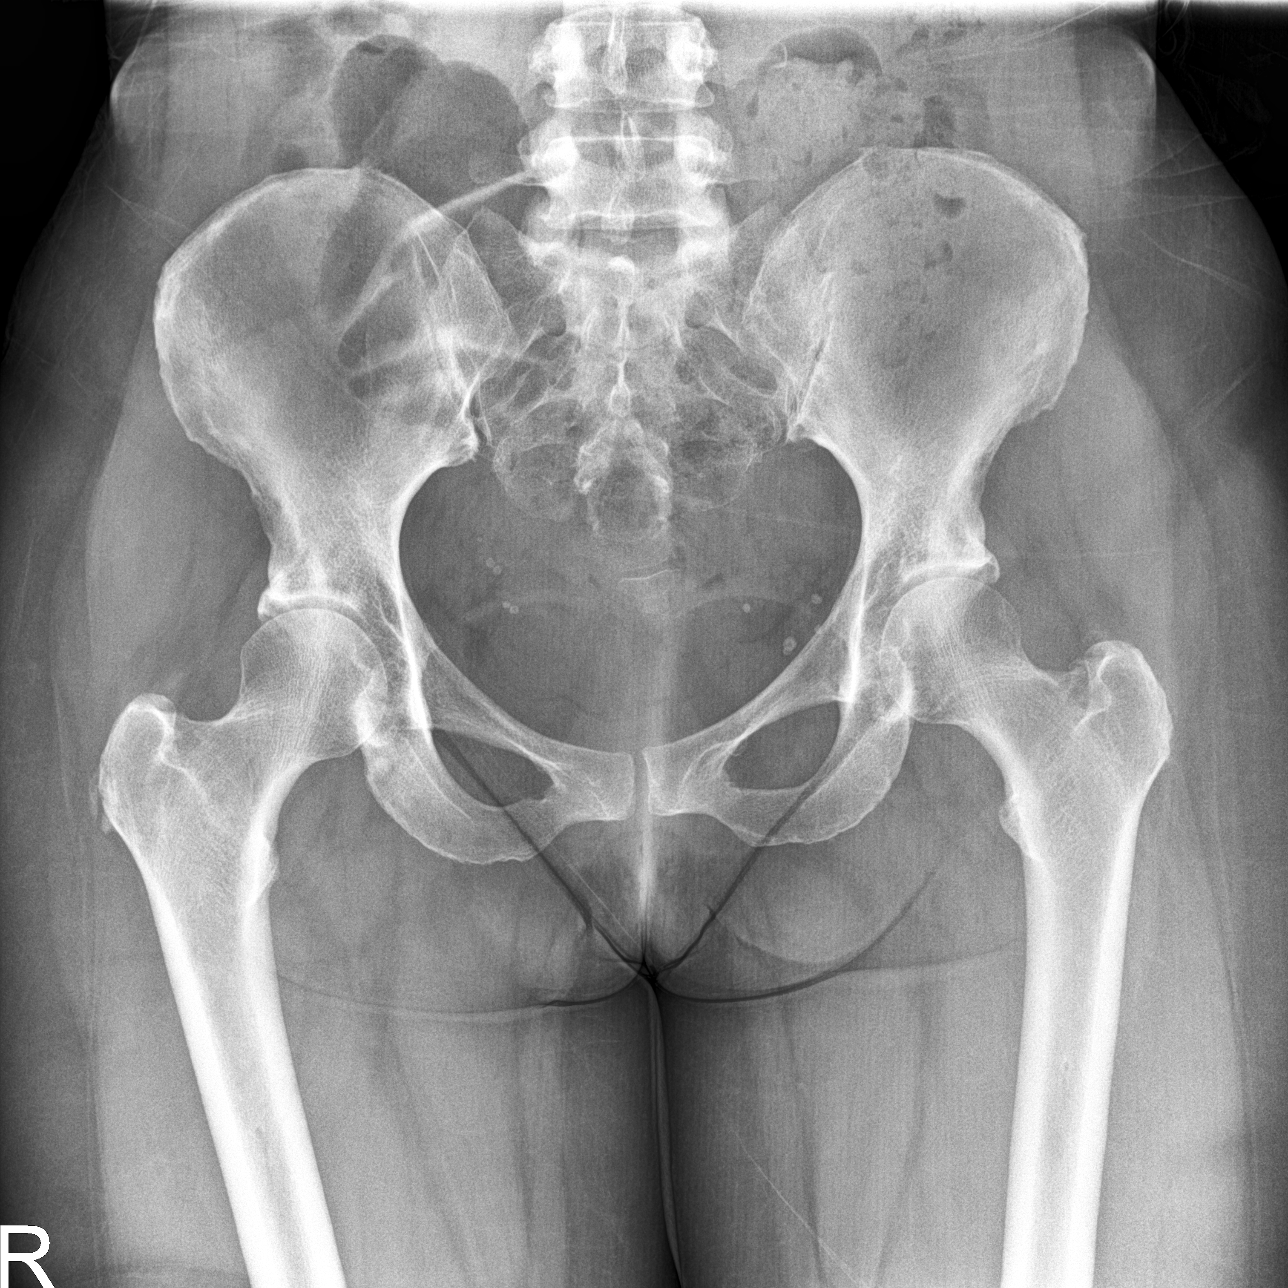
[im 2/3]
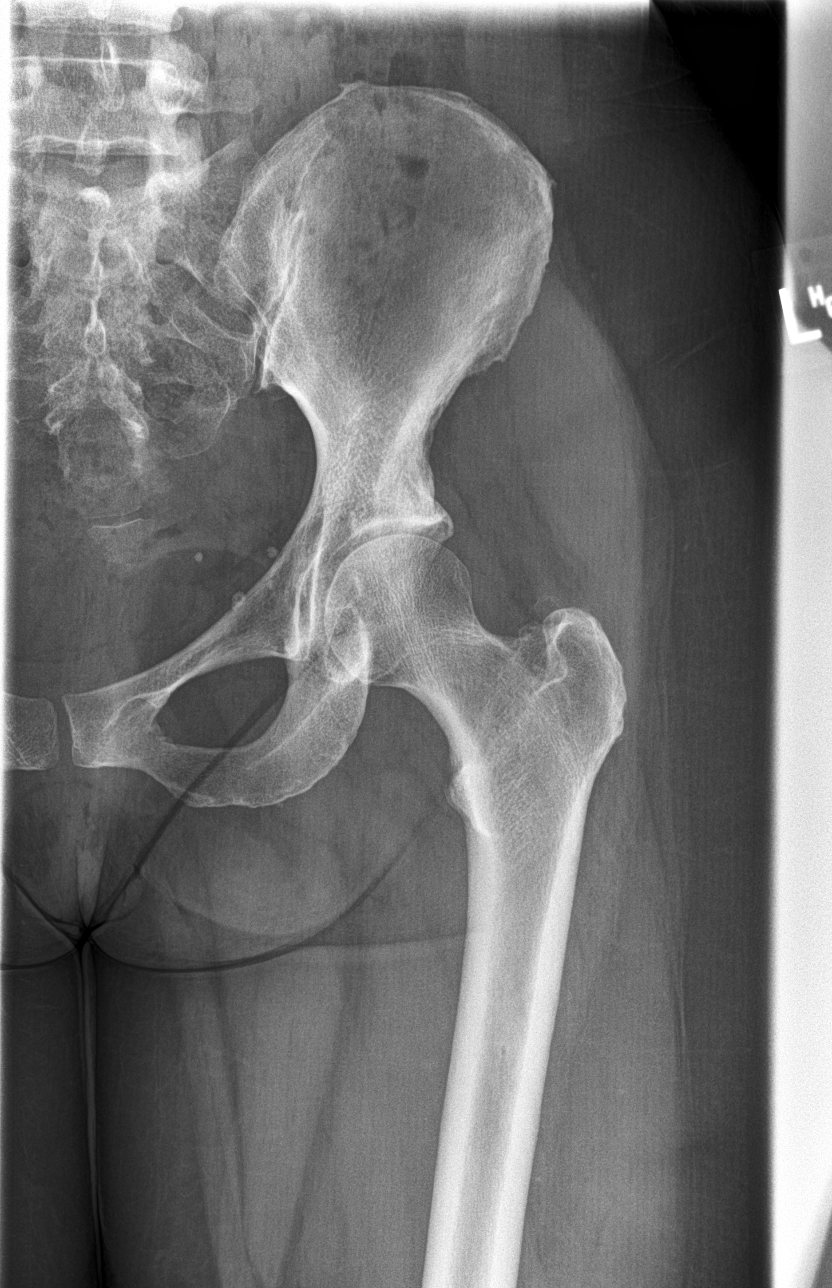
[im 3/3]
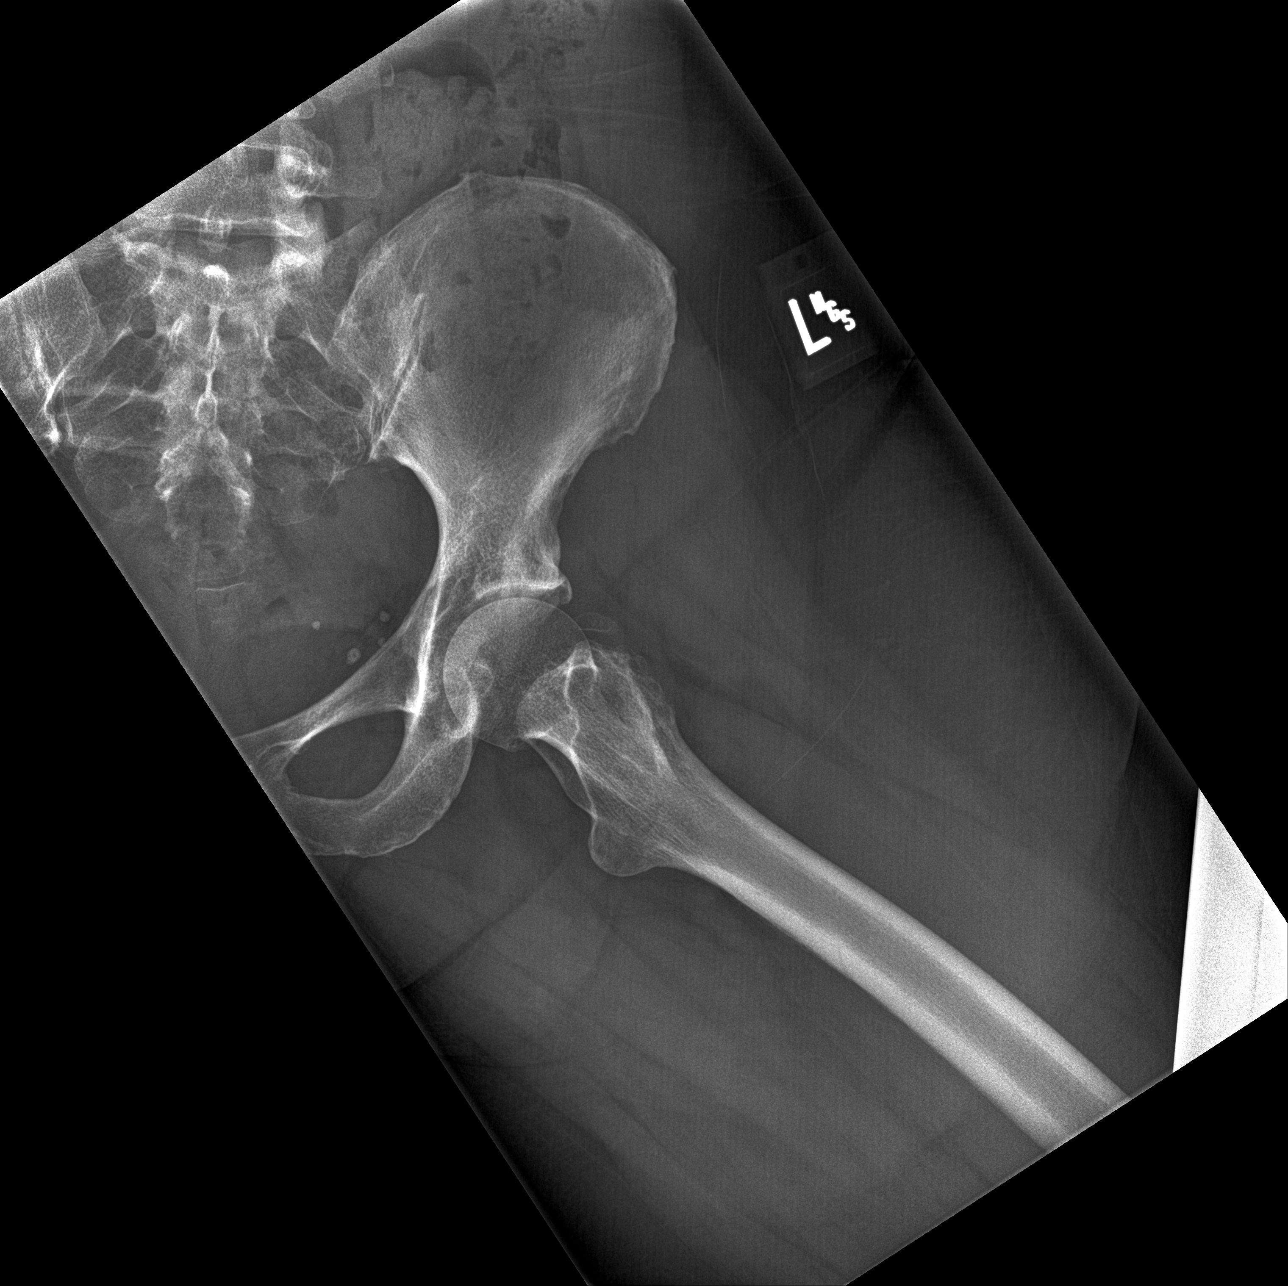

[3 of 3 positions shown; findings below may reference images not displayed]

FINDINGS: Osseous mineralization grossly normal for technique.

Joint spaces preserved.

No acute fracture, dislocation, or bone destruction.

Scattered pelvic phleboliths.
IMPRESSION: No acute osseous abnormalities.

## 2017-11-06 ENCOUNTER — Encounter: Payer: Self-pay | Admitting: Family Medicine

## 2017-11-06 ENCOUNTER — Ambulatory Visit
Admission: RE | Admit: 2017-11-06 | Discharge: 2017-11-06 | Disposition: A | Discharge status: Home / Self Care | Payer: Medicare Other | Source: Ambulatory Visit | Attending: Family Medicine | Admitting: Family Medicine

## 2017-11-06 DIAGNOSIS — G459 Transient cerebral ischemic attack, unspecified: Secondary | ICD-10-CM

## 2017-11-06 DIAGNOSIS — R29898 Other symptoms and signs involving the musculoskeletal system: Secondary | ICD-10-CM

## 2017-11-06 DIAGNOSIS — Z8673 Personal history of transient ischemic attack (TIA), and cerebral infarction without residual deficits: Secondary | ICD-10-CM

## 2017-11-06 DIAGNOSIS — I6521 Occlusion and stenosis of right carotid artery: Secondary | ICD-10-CM | POA: Diagnosis not present

## 2017-11-06 DIAGNOSIS — I6523 Occlusion and stenosis of bilateral carotid arteries: Secondary | ICD-10-CM

## 2017-11-06 HISTORY — DX: Occlusion and stenosis of bilateral carotid arteries: I65.23

## 2017-11-07 ENCOUNTER — Ambulatory Visit
Admission: RE | Admit: 2017-11-07 | Discharge: 2017-11-07 | Disposition: A | Discharge status: Home / Self Care | Payer: Medicare Other | Source: Ambulatory Visit | Attending: Family Medicine | Admitting: Family Medicine

## 2017-11-07 ENCOUNTER — Encounter: Payer: Self-pay | Admitting: *Deleted

## 2017-11-07 ENCOUNTER — Telehealth: Payer: Self-pay | Admitting: Family Medicine

## 2017-11-07 DIAGNOSIS — G459 Transient cerebral ischemic attack, unspecified: Secondary | ICD-10-CM | POA: Diagnosis not present

## 2017-11-07 DIAGNOSIS — Z8673 Personal history of transient ischemic attack (TIA), and cerebral infarction without residual deficits: Secondary | ICD-10-CM | POA: Diagnosis not present

## 2017-11-07 DIAGNOSIS — R29898 Other symptoms and signs involving the musculoskeletal system: Secondary | ICD-10-CM | POA: Insufficient documentation

## 2017-11-07 DIAGNOSIS — K219 Gastro-esophageal reflux disease without esophagitis: Secondary | ICD-10-CM | POA: Insufficient documentation

## 2017-11-07 DIAGNOSIS — G629 Polyneuropathy, unspecified: Secondary | ICD-10-CM | POA: Diagnosis not present

## 2017-11-07 NOTE — Telephone Encounter (Signed)
Pt called asking if results from [place] were available today and pt notified that results were not yet available. Pt voiced concerns about having mini strokes and blocked arteries and what it all meant. Explained to the pt that having blocked arteries could result in having strokes and that the ultrasound done and the echocardiogram would help the providers to see blocked areas so that a treatment plan be further developed. Explained to the pt that once results come in she would be notified.  Pt verbalized understanding.

## 2017-11-07 NOTE — Telephone Encounter (Signed)
This encounter was created in error - please disregard.

## 2017-11-07 NOTE — Progress Notes (Signed)
*  PRELIMINARY RESULTS* Echocardiogram 2D Echocardiogram has been performed.  Sherrie Sport 11/07/2017, 11:41 AM

## 2017-11-27 DIAGNOSIS — R002 Palpitations: Secondary | ICD-10-CM | POA: Diagnosis not present

## 2017-11-27 DIAGNOSIS — G43109 Migraine with aura, not intractable, without status migrainosus: Secondary | ICD-10-CM | POA: Diagnosis not present

## 2017-11-27 DIAGNOSIS — G458 Other transient cerebral ischemic attacks and related syndromes: Secondary | ICD-10-CM | POA: Diagnosis not present

## 2017-11-27 DIAGNOSIS — R2990 Unspecified symptoms and signs involving the nervous system: Secondary | ICD-10-CM | POA: Diagnosis not present

## 2017-12-02 ENCOUNTER — Other Ambulatory Visit: Payer: Self-pay

## 2017-12-02 ENCOUNTER — Encounter: Payer: Self-pay | Admitting: Family Medicine

## 2017-12-02 ENCOUNTER — Telehealth: Payer: Self-pay

## 2017-12-02 ENCOUNTER — Ambulatory Visit (INDEPENDENT_AMBULATORY_CARE_PROVIDER_SITE_OTHER): Payer: Medicare Other | Admitting: Family Medicine

## 2017-12-02 VITALS — BP 118/74 | HR 78 | Temp 98.3°F | Ht 59.0 in | Wt 127.5 lb

## 2017-12-02 DIAGNOSIS — I6523 Occlusion and stenosis of bilateral carotid arteries: Secondary | ICD-10-CM | POA: Diagnosis not present

## 2017-12-02 DIAGNOSIS — R569 Unspecified convulsions: Secondary | ICD-10-CM | POA: Diagnosis not present

## 2017-12-02 DIAGNOSIS — Z8673 Personal history of transient ischemic attack (TIA), and cerebral infarction without residual deficits: Secondary | ICD-10-CM

## 2017-12-02 DIAGNOSIS — R8290 Unspecified abnormal findings in urine: Secondary | ICD-10-CM | POA: Diagnosis not present

## 2017-12-02 DIAGNOSIS — N898 Other specified noninflammatory disorders of vagina: Secondary | ICD-10-CM

## 2017-12-02 MED ORDER — ATORVASTATIN CALCIUM 40 MG PO TABS
40.0000 mg | ORAL_TABLET | Freq: Every day | ORAL | 5 refills | Status: DC
Start: 2017-12-02 — End: 2018-05-19

## 2017-12-02 NOTE — Progress Notes (Signed)
BP 118/74 (BP Location: Left Arm, Patient Position: Sitting, Cuff Size: Normal)   Pulse 78   Temp 98.3 F (36.8 C) (Oral)   Ht 4\' 11"  (1.499 m)   Wt 127 lb 8 oz (57.8 kg)   SpO2 94%   BMI 25.75 kg/m    Subjective:    Patient ID: Elizabeth Mata, female    DOB: 1961-08-19, 56 y.o.   MRN: [id number]  HPI: Elizabeth Mata is a 56 y.o. female  Chief Complaint  Patient presents with  . Hyperlipidemia  . BV    Pt states she believes that she has BV   . Follow-up    Pt states that she was seen at [facility], told that she was needed to get set up for heart monitor    HPI Patient thinks she has BV; she thinks she has an infection down below; having an odor, no discharge; no itching; no burning with urination, maybe a little, not sure  She has high cholesterol; taking lipitor; goal LDL less than 70, recheck fasting lipids in May per neurologist at [facility] (note reviewed)  She went to [facility] and was told she needed a heart monitor; note reviewed, and it documents that she should get a 30 day event monitor done to look for paroxysmal atrial fibrillation; taking aspirin  Migraines; neurologist has her taking topiramate 200 mg BID  Depression screen [facility] 2/9 12/02/2017 10/27/2017 09/26/2017 07/11/2017 04/03/2017  Decreased Interest 0 0 0 0 0  Down, Depressed, Hopeless 0 0 0 0 0  PHQ - 2 Score 0 0 0 0 0   Relevant past medical, surgical, family and social history reviewed Past Medical History:  Diagnosis Date  . Allergic rhinitis 06/30/2016  . Carotid atherosclerosis, bilateral 11/06/2017   [place] Nov 2018  . Cyst of breast, left, solitary   . Flank pain   . GERD (gastroesophageal reflux disease)   . Hemorrhoid   . IFG (impaired fasting glucose) 07/18/2016   June 2017   . Kidney stones   . Neuropathy 07/24/2016  . Seizures ([place])   . Shingles outbreak 02/03/2017  . Syncope    Recurrent over the last 2 years. had workup at [facility] this year with cardiology and neurology. Had echo and  3-week event monitor that were unremarkable. Has had negative EEG. Echo in 11/11 at [facility] showed no significant abnormalities. Possibly vasovagal  . Trigger thumb    Past Surgical History:  Procedure Laterality Date  . ABDOMINAL HYSTERECTOMY  40s   endometriosis, fibroid tumors; no cancer, partial  . BREAST BIOPSY Left 1980'S   EXCISIONAL - NEG  . CHOLECYSTECTOMY  31  . FOOT SURGERY Right    tendon release   Family History  Problem Relation Age of Onset  . Coronary artery disease Maternal Uncle   . Diabetes Mellitus II Mother   . Arthritis Mother   . Diabetes Maternal Uncle   . Kidney cancer Father 37       died 05/07/2017  . Kidney disease Father   . Cancer Father        kidney and bladder  . Arthritis Maternal Uncle   . Cancer Maternal Grandmother        stomach?  . Jaundice Maternal Grandmother   . Stomach cancer Paternal Grandmother   . Cancer Paternal Grandmother        stomach  . Hypertension Sister   . Stroke Maternal Grandfather   . Diabetes Paternal Grandfather   . Diabetes  Sister   . Hypertension Sister   . Breast cancer Neg Hx   . Bladder Cancer Neg Hx    Social History   Tobacco Use  . Smoking status: Never Smoker  . Smokeless tobacco: Never Used  Substance Use Topics  . Alcohol use: Yes    Alcohol/week: 0.0 oz    Comment: occ  . Drug use: No    Interim medical history since last visit reviewed. Allergies and medications reviewed  Review of Systems Per HPI unless specifically indicated above     Objective:    BP 118/74 (BP Location: Left Arm, Patient Position: Sitting, Cuff Size: Normal)   Pulse 78   Temp 98.3 F (36.8 C) (Oral)   Ht 4\' 11"  (1.499 m)   Wt 127 lb 8 oz (57.8 kg)   SpO2 94%   BMI 25.75 kg/m   Wt Readings from Last 3 Encounters:  12/02/17 127 lb 8 oz (57.8 kg)  10/27/17 125 lb 14.4 oz (57.1 kg)  10/09/17 134 lb 8 oz (61 kg)    Physical Exam  Constitutional: She appears well-developed and well-nourished.  HENT:    Mouth/Throat: Mucous membranes are normal.  Eyes: EOM are normal. No scleral icterus.  Cardiovascular: Normal rate and regular rhythm.  No extrasystoles are present.  Pulmonary/Chest: Effort normal and breath sounds normal.  Abdominal: She exhibits no distension.  Genitourinary: There is no rash, tenderness or lesion on the right labia. There is no rash, tenderness or lesion on the left labia. No erythema in the vagina. Vaginal discharge (scant, no foul or fishy odor) found.  Neurological: She is alert. She displays no tremor.  No facial asymmetry  Skin: She is not diaphoretic. No pallor.  Psychiatric: She has a normal mood and affect. Her behavior is normal. Her mood appears not anxious.  Very pleasant, cooperative   Results for orders placed or performed during the hospital encounter of 10/08/17  CBC with Differential  Result Value Ref Range   WBC 4.0 3.6 - 11.0 K/uL   RBC 4.22 3.80 - 5.20 MIL/uL   Hemoglobin 13.7 12.0 - 16.0 g/dL   HCT 39.0 35.0 - 47.0 %   MCV 92.3 80.0 - 100.0 fL   MCH 32.4 26.0 - 34.0 pg   MCHC 35.1 32.0 - 36.0 g/dL   RDW 13.1 11.5 - 14.5 %   Platelets 277 150 - 440 K/uL   Neutrophils Relative % 46 %   Neutro Abs 1.8 1.4 - 6.5 K/uL   Lymphocytes Relative 46 %   Lymphs Abs 1.8 1.0 - 3.6 K/uL   Monocytes Relative 6 %   Monocytes Absolute 0.3 0.2 - 0.9 K/uL   Eosinophils Relative 1 %   Eosinophils Absolute 0.0 0 - 0.7 K/uL   Basophils Relative 1 %   Basophils Absolute 0.0 0 - 0.1 K/uL  Comprehensive metabolic panel  Result Value Ref Range   Sodium 142 135 - 145 mmol/L   Potassium 3.8 3.5 - 5.1 mmol/L   Chloride 112 (H) 101 - 111 mmol/L   CO2 24 22 - 32 mmol/L   Glucose, Bld 104 (H) 65 - 99 mg/dL   BUN 15 6 - 20 mg/dL   Creatinine, Ser 0.88 0.44 - 1.00 mg/dL   Calcium 9.4 8.9 - 10.3 mg/dL   Total Protein 6.7 6.5 - 8.1 g/dL   Albumin 3.9 3.5 - 5.0 g/dL   AST 23 15 - 41 U/L   ALT 20 14 - 54 U/L   Alkaline  Phosphatase 68 38 - 126 U/L   Total Bilirubin  0.6 0.3 - 1.2 mg/dL   GFR calc non Af Amer >60 >60 mL/min   GFR calc Af Amer >60 >60 mL/min   Anion gap 6 5 - 15  HIV antibody (Routine Testing)  Result Value Ref Range   HIV Screen 4th Generation wRfx Non Reactive Non Reactive  Hemoglobin A1c  Result Value Ref Range   Hgb A1c MFr Bld 5.7 (H) 4.8 - 5.6 %   Mean Plasma Glucose 116.89 mg/dL  Anti-smooth muscle antibody, IgG  Result Value Ref Range   F-Actin IgG 5 0 - 19 Units  Striated muscle antibody  Result Value Ref Range   Anti-striation Abs Negative Neg:<1:40  Acetylcholine receptor, binding  Result Value Ref Range   Acety choline binding ab <0.03 0.00 - 0.24 nmol/L  Sedimentation rate  Result Value Ref Range   Sed Rate 12 0 - 30 mm/hr  Vitamin B12  Result Value Ref Range   Vitamin B-12 770 180 - 914 pg/mL      Assessment & Plan:   Problem List Items Addressed This Visit      Cardiovascular and Mediastinum   Carotid atherosclerosis, bilateral    Reviewed by neurologist at [facility] as well; continue statin, aspirin; goal LDL less than 70; refills of statin provided today      Relevant Medications   atorvastatin (LIPITOR) 40 MG tablet     Other   Seizures ([place])    Managed by neurologist      Hx of completed stroke - Primary    Will set her up for a 30 day event recorder to check for paroxysmal atrial fibrillation; once this was put in, I received notification that I could not order this, but had to instead refer her to a cardiologist; new referral to cardiology entered to see about having the 30 day event recorder done per [facility] Neurologist's recommendation      Relevant Orders   CARDIAC EVENT MONITOR   Ambulatory referral to Cardiology    Other Visit Diagnoses    Abnormal urine odor       POCT urine ordered today   Relevant Orders   POCT Urinalysis Dipstick   Vaginal odor       wet mount collected   Relevant Orders   WET PREP BY MOLECULAR PROBE       Follow up plan: Return in about 6 months (around  06/02/2018) for twenty minute follow-up with fasting labs.  An after-visit summary was printed and given to the patient at [place].  Please see the patient instructions which may contain other information and recommendations beyond what is mentioned above in the assessment and plan.  Meds ordered this encounter  Medications  . atorvastatin (LIPITOR) 40 MG tablet    Sig: Take 1 tablet (40 mg total) by mouth daily at 6 PM.    Dispense:  30 tablet    Refill:  5    Orders Placed This Encounter  Procedures  . WET PREP BY MOLECULAR PROBE  . Ambulatory referral to Cardiology  . CARDIAC EVENT MONITOR  . POCT Urinalysis Dipstick

## 2017-12-02 NOTE — Assessment & Plan Note (Signed)
Managed by neurologist

## 2017-12-02 NOTE — Assessment & Plan Note (Addendum)
Will set her up for a 30 day event recorder to check for paroxysmal atrial fibrillation; once this was put in, I received notification that I could not order this, but had to instead refer her to a cardiologist; new referral to cardiology entered to see about having the 30 day event recorder done per [facility] Neurologist's recommendation

## 2017-12-02 NOTE — Patient Instructions (Signed)
We'll contact you about the lab results You should hear something about the heart monitor within one week If you have not heard anything from my staff in a week about any orders/referrals/studies from today, please contact us here to follow-up [phone] Try to limit saturated fats in your diet (bologna, hot dogs, barbeque, cheeseburgers, hamburgers, steak, bacon, sausage, cheese, etc.) and get more fresh fruits, vegetables, and whole grains

## 2017-12-02 NOTE — Telephone Encounter (Signed)
Copied from [place]. Topic: General - Other >> Dec 02, 2017  3:27 PM Yvette Rack wrote: Reason for CRM: Marcie Bal from [employer] states that [facility] had sent over a fax for a referral for pt to have a Cardiac Event Monitor Ms Marcie Bal states that they can't do that and that patient need to be referred to [facility] Cardiologist to have this done   Dr. Sanda Klein, based upon the message listed above, I this this will have to be put in as a referral to cardiology.

## 2017-12-02 NOTE — Telephone Encounter (Signed)
I entered the referral in the office note

## 2017-12-02 NOTE — Assessment & Plan Note (Signed)
Reviewed by neurologist at [facility] as well; continue statin, aspirin; goal LDL less than 70; refills of statin provided today

## 2017-12-02 NOTE — Telephone Encounter (Signed)
Thank you. Will do!

## 2017-12-03 ENCOUNTER — Other Ambulatory Visit: Payer: Self-pay | Admitting: Family Medicine

## 2017-12-03 LAB — WET PREP BY MOLECULAR PROBE
Candida species: NOT DETECTED
MICRO NUMBER:: 81362580
SPECIMEN QUALITY:: ADEQUATE
Trichomonas vaginosis: NOT DETECTED

## 2017-12-04 ENCOUNTER — Telehealth: Payer: Self-pay

## 2017-12-04 MED ORDER — SECNIDAZOLE 2 G PO PACK: 1.0000 | PACK | Freq: Once | ORAL | 0 refills | Status: AC

## 2017-12-04 NOTE — Telephone Encounter (Signed)
Please let her know that the other option to consider is Solosec (thank you for that recommendation) It is related to metronidazole, so I would URGE her to talk to her pharmacist first about this drug and her previous experience with metronidazole She may need to take benadryl around-the-clock for 24-48 hours to prevent an allergic reaction, and I would advise her to NOT use the new medicine if her previous metronidazole reaction was significant If she has any concern, DON'T take the new medicine and let's consider boric acid or clindamycine

## 2017-12-04 NOTE — Telephone Encounter (Signed)
Called pt no answer. LM for pt informing her of the information below, in addition advised pt that there is a coupon at the front desk that will work for [employer]. Advised pt to call back for questions or concerns.

## 2017-12-04 NOTE — Telephone Encounter (Signed)
Called pt informed her of +BV. Pt gave verbal understanding, questioned pt about her reactions w/ Metronidazole and clindamycin. Pt states that metronidazole causes her to have hives towards the end of the the dose. Pt states that she believes she has taken clindamycin w/o reaction but cannot be sure. Pt states that vaginal agents do not work well for her. Perhaps Solosec would be a good alternative for her. Please advise. Thanks!

## 2017-12-04 NOTE — Telephone Encounter (Signed)
-----   Message from Arnetha Courser, MD sent at 12/02/2017  6:15 PM EST ----- Regarding: can you track down urine dip results? We had Elizabeth Mata in room 5 give [place] a urine specimen before we did her wet mount. Can you track down those results and enter, and then re-order the POCT urine? I could not sign off on my note without them in my chart, so I had to hit "cancel". Thank you!!

## 2017-12-04 NOTE — Telephone Encounter (Signed)
-----   Message from Arnetha Courser, MD sent at 12/03/2017 12:00 PM EST ----- Please let pt know that she does indeed have bacterial vaginosis. She has allergies listed to clindamycin and metronidazole, which are two commonly used medicines for this. Please find out what her exact reactions / severity to each of these was and back to me for treatment option Thank you

## 2017-12-04 NOTE — Telephone Encounter (Signed)
Called pt and asked that she drop off a urine sample for me as previous one did not get documented. Pt gave verbal understanding and states that she will come by today.

## 2017-12-05 ENCOUNTER — Other Ambulatory Visit: Payer: Self-pay | Admitting: Family Medicine

## 2017-12-05 ENCOUNTER — Telehealth: Payer: Self-pay | Admitting: Family Medicine

## 2017-12-05 MED ORDER — CLINDAMYCIN PHOSPHATE 2 % VA CREA: 1.0000 | TOPICAL_CREAM | Freq: Every day | VAGINAL | 0 refills | Status: DC

## 2017-12-05 NOTE — Telephone Encounter (Signed)
Pt comes into office for urine sample, states that she cannot afford solosec even with coupon card. Would like to try metronidazole with benadryl.   Spoke with Dr.Lada who states that she believes it would be much safer for pt to use clinamycin and there was minimal reaction with that medication per the pt. Informed pt of this information, pt gave verbal understanding.

## 2017-12-05 NOTE — Telephone Encounter (Signed)
Copied from [place]. Topic: Quick Communication - See Telephone Encounter >> Dec 05, 2017 11:30 AM Boyd Kerbs wrote: CRM for notification. See Telephone encounter for:  Pt. Called regarding infection bacterial - nurse was to call in prescription, she said was expensive.  Not called yet but pt said ins. May not cover on insurance.  Can you check on pre-authorization or different medication.  Please let pt. know 12/05/17.

## 2017-12-05 NOTE — Progress Notes (Signed)
Patient could not afford the other med She does think she is allergic to clinda Will use that

## 2017-12-11 ENCOUNTER — Telehealth: Payer: Self-pay | Admitting: Family Medicine

## 2017-12-11 NOTE — Telephone Encounter (Signed)
Called pharmacy, verified that they did indeed receive rx on 12/05/2017 at 3:31pm. Spoke with Theresia Lo who gave no apparent reason as to why the pt was not told about her RX. Will call pt and inform her that pharmacy is preparing rx and states that they will have it ready by 2pm 12/12/2017.   Called pt informed her of the information above. Pt gave verbal understanding.

## 2017-12-11 NOTE — Telephone Encounter (Signed)
Copied from [place]. Topic: Quick Communication - See Telephone Encounter >> Dec 11, 2017 10:08 AM Cleaster Corin, NT wrote: CRM for notification. See Telephone encounter for:   12/11/17. Pt calling and still hasn't received med. (pharmacy still ordered med. That was to expensive). Pt. Would like Dr. Sanda Klein to send new rx. For a more affordable med.   [id number] - [place], [place] - [address] [place] [address] Phone: [phone] Fax: [phone] Not a 24 hour pharmacy; exact hours not know

## 2017-12-15 ENCOUNTER — Telehealth: Payer: Self-pay | Admitting: Family Medicine

## 2017-12-15 NOTE — Telephone Encounter (Signed)
Pt notified will try cream

## 2017-12-15 NOTE — Telephone Encounter (Signed)
The pill she suggested won't work She has allergies listed, so I don't dare use the pill I'll encourage the cream, or referral to GYN

## 2017-12-15 NOTE — Telephone Encounter (Signed)
Copied from [place]. Topic: Quick Communication - See Telephone Encounter >> Dec 15, 2017  8:40 AM Conception Chancy, NT wrote: CRM for notification. See Telephone encounter for:  12/15/17.  Patient is calling requesting a refill for atorvastatin 40 mg. I informed patient a rx was sent to her pharmacy on 12/04 with 5 refills and she states the pharmacy is telling her there is nothing there. Also patient said she does not wont the cream ( clindamycin) she would like a pill that she can take in place of the cream. She says she has take Nitrofurantion before for the bacteria infection. It was prescribed as take 1 tablet 2x a day 100mg .   Please contact the patient if you have any questions or concerns. Thank you

## 2017-12-15 NOTE — Telephone Encounter (Signed)
Called pharmacy left voicemail to confirm chol. med

## 2018-01-05 ENCOUNTER — Ambulatory Visit (INDEPENDENT_AMBULATORY_CARE_PROVIDER_SITE_OTHER): Payer: Medicare Other | Admitting: Family Medicine

## 2018-01-05 ENCOUNTER — Ambulatory Visit: Payer: Self-pay

## 2018-01-05 ENCOUNTER — Encounter: Payer: Self-pay | Admitting: Family Medicine

## 2018-01-05 VITALS — BP 120/68 | HR 77 | Temp 98.5°F | Resp 16 | Ht 59.0 in | Wt 127.0 lb

## 2018-01-05 DIAGNOSIS — L509 Urticaria, unspecified: Secondary | ICD-10-CM | POA: Diagnosis not present

## 2018-01-05 MED ORDER — RANITIDINE HCL 150 MG PO TABS: 150.0000 mg | ORAL_TABLET | Freq: Two times a day (BID) | ORAL | 0 refills | Status: DC

## 2018-01-05 MED ORDER — HYDROXYZINE HCL 25 MG PO TABS: 25.0000 mg | ORAL_TABLET | Freq: Three times a day (TID) | ORAL | 0 refills | Status: DC | PRN

## 2018-01-05 NOTE — Progress Notes (Signed)
Name: Elizabeth Mata   MRN: [id number]    DOB: 1961-08-24   Date:01/05/2018       Progress Note  Subjective  Chief Complaint  Chief Complaint  Patient presents with  . Urticaria    for 4 days    HPI  Pt presents with concern for hives x4 days after preparing a fresh pineapple. She notes the hives have been coming and going intermittently for the last 4 days; endorses constant itching to BUE, BLE, back, abdomen, mild itching on her face.  Has had hives in the past - unclear what she is allergic to.  She has been taking benadryl with only minimal relief.  She has never seen an allergist to determine her allergens.  She denies Throat/tongue/lip swelling or tightness, no chest pain or shortness of breath.  Last benadryl dose was last night.  Takes Allegra daily.  Patient Active Problem List   Diagnosis Date Noted  . Carotid atherosclerosis, bilateral 11/06/2017  . TIA (transient ischemic attack) 10/28/2017  . Hx of completed stroke 10/28/2017  . Ataxia 10/08/2017  . Breast pain in female 07/11/2017  . Medication monitoring encounter 12/20/2016  . Family health problem 12/20/2016  . Abnormality of globulin 07/26/2016  . Neuropathy 07/24/2016  . 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 IFG (impaired fasting glucose) 07/18/2016  . Hematuria 07/03/2016  . Allergic rhinitis 06/30/2016  . Chronic left hip pain 06/28/2016  . Breast cancer screening 04/17/2016  . Skin texture changes 12/27/2015  . Vaginal discharge 10/31/2015  . Screening for STD (sexually transmitted disease) 10/31/2015  . Kidney stone on left side 10/24/2015  . Annual physical exam 09/13/2015  . Encounter for screening mammogram for malignant neoplasm of breast 09/13/2015  . Need for immunization against influenza 09/13/2015  . Eczema 09/13/2015  . Irritable bowel syndrome with constipation 09/13/2015  . Seizures ([place]) 08/16/2015  . Carpal tunnel syndrome 07/26/2014  . Carotid artery narrowing 07/05/2013  .  Basilar artery migraine 07/06/2012    Social History   Tobacco Use  . Smoking status: Never Smoker  . Smokeless tobacco: Never Used  Substance Use Topics  . Alcohol use: Yes    Alcohol/week: 0.0 oz    Comment: occ     Current Outpatient Medications:  .  aspirin EC 81 MG EC tablet, Take 1 tablet (81 mg total) by mouth daily., Disp: 30 tablet, Rfl: 1 .  atorvastatin (LIPITOR) 40 MG tablet, Take 1 tablet (40 mg total) by mouth daily at 6 PM., Disp: 30 tablet, Rfl: 5 .  fexofenadine (ALLEGRA) 180 MG tablet, Take 1 tablet (180 mg total) by mouth daily as needed for allergies or rhinitis., Disp: 90 tablet, Rfl: 3 .  fluticasone (FLONASE) 50 MCG/ACT nasal spray, Place 2 sprays into both nostrils daily. (Patient taking differently: Place 2 sprays into both nostrils daily as needed. ), Disp: 16 g, Rfl: 6 .  topiramate (TOPAMAX) 200 MG tablet, TAKE 1 TABLET (200 MG TOTAL) BY MOUTH 2 (TWO) TIMES DAILY., Disp: , Rfl: 11 .  ciclopirox (LOPROX) 0.77 % cream, APPLY TOPICALLY 2 (TWO) TIMES DAILY. TO AFFECTED SKIN FOR UP TO 4 WEEKS, Disp: , Rfl: 0 .  clindamycin (CLEOCIN) 2 % vaginal cream, Place 1 Applicatorful vaginally at bedtime. (Patient not taking: Reported on 01/05/2018), Disp: 40 g, Rfl: 0 .  triamcinolone ointment (KENALOG) 0.1 %, APPLY ON THE SKIN TWICE A DAY AS NEEDED AS NEEDED RASH NOT TO FACE, Disp: , Rfl: 1  Allergies  Allergen Reactions  .  Clindamycin Hcl Hives  . Clindamycin/Lincomycin Hives  . Hydrocodone Nausea Only  . Metronidazole   . Sulfa Antibiotics Hives    Break out in hives all over  . Oxycodone Nausea And Vomiting    ROS  Constitutional: Negative for fever or weight change.  Respiratory: Negative for cough and shortness of breath.   Cardiovascular: Negative for chest pain or palpitations.  Gastrointestinal: Negative for abdominal pain, no bowel changes.  Musculoskeletal: Negative for gait problem or joint swelling.  Skin: See HPI Neurological: Negative for  dizziness or headache.  No other specific complaints in a complete review of systems (except as listed in HPI above).  Objective  Vitals:   01/05/18 1549  BP: 120/68  Pulse: 77  Resp: 16  Temp: 98.5 F (36.9 C)  TempSrc: Oral  SpO2: 98%  Weight: 127 lb (57.6 kg)  Height: _0  (1.499 m)   Body mass index is 25.65 kg/m.  Nursing Note and Vital Signs reviewed.  Physical Exam  Constitutional: Patient appears well-developed and well-nourished.  No distress.  HEENT: head atraumatic, normocephalic, pupils equal and reactive to light, EOM's intact, neck supple without lymphadenopathy, oropharynx pink and moist without exudate Cardiovascular: Normal rate, regular rhythm, S1/S2 present.  No murmur or rub heard. No BLE edema. Pulmonary/Chest: Effort normal and breath sounds clear. No respiratory distress or retractions. Psychiatric: Patient has a normal mood and affect. behavior is normal. Judgment and thought content normal. Skin: Pt itching during examination; no appreciable rash, however pt notes rash has been intermittent.  Recent Results (from the past 2160 hour(s))  CBC with Differential     Status: None   Collection Time: 10/08/17  9:04 AM  Result Value Ref Range   WBC 4.0 3.6 - 11.0 K/uL   RBC 4.22 3.80 - 5.20 MIL/uL   Hemoglobin 13.7 12.0 - 16.0 g/dL   HCT 39.0 35.0 - 47.0 %   MCV 92.3 80.0 - 100.0 fL   MCH 32.4 26.0 - 34.0 pg   MCHC 35.1 32.0 - 36.0 g/dL   RDW 13.1 11.5 - 14.5 %   Platelets 277 150 - 440 K/uL   Neutrophils Relative % 46 %   Neutro Abs 1.8 1.4 - 6.5 K/uL   Lymphocytes Relative 46 %   Lymphs Abs 1.8 1.0 - 3.6 K/uL   Monocytes Relative 6 %   Monocytes Absolute 0.3 0.2 - 0.9 K/uL   Eosinophils Relative 1 %   Eosinophils Absolute 0.0 0 - 0.7 K/uL   Basophils Relative 1 %   Basophils Absolute 0.0 0 - 0.1 K/uL  Comprehensive metabolic panel     Status: Abnormal   Collection Time: 10/08/17  9:04 AM  Result Value Ref Range   Sodium 142 135 - 145  mmol/L   Potassium 3.8 3.5 - 5.1 mmol/L   Chloride 112 (H) 101 - 111 mmol/L   CO2 24 22 - 32 mmol/L   Glucose, Bld 104 (H) 65 - 99 mg/dL   BUN 15 6 - 20 mg/dL   Creatinine, Ser 0.88 0.44 - 1.00 mg/dL   Calcium 9.4 8.9 - 10.3 mg/dL   Total Protein 6.7 6.5 - 8.1 g/dL   Albumin 3.9 3.5 - 5.0 g/dL   AST 23 15 - 41 U/L   ALT 20 14 - 54 U/L   Alkaline Phosphatase 68 38 - 126 U/L   Total Bilirubin 0.6 0.3 - 1.2 mg/dL   GFR calc non Af Amer >60 >60 mL/min   GFR calc Af  Amer >60 >60 mL/min    Comment: (NOTE) The eGFR has been calculated using the CKD EPI equation. This calculation has not been validated in all clinical situations. eGFR's persistently <60 mL/min signify possible Chronic Kidney Disease.    Anion gap 6 5 - 15  HIV antibody (Routine Testing)     Status: None   Collection Time: 10/08/17  1:24 PM  Result Value Ref Range   HIV Screen 4th Generation wRfx Non Reactive Non Reactive    Comment: (NOTE) Performed At: [facility] [place], [place] [id number] Lindon Romp MD [id number]   Hemoglobin A1c     Status: Abnormal   Collection Time: 10/08/17  1:24 PM  Result Value Ref Range   Hgb A1c MFr Bld 5.7 (H) 4.8 - 5.6 %    Comment: (NOTE) Pre diabetes:          5.7%-6.4% Diabetes:              >6.4% Glycemic control for   <7.0% adults with diabetes    Mean Plasma Glucose 116.89 mg/dL    Comment: Performed at [facility], [place] [address] [place], [place] [address]  Anti-smooth muscle antibody, IgG     Status: None   Collection Time: 10/08/17  2:23 PM  Result Value Ref Range   F-Actin IgG 5 0 - 19 Units    Comment: (NOTE)                 Negative                     0 - 19                 Weak positive               20 - 30                 Moderate to strong positive     >30 Actin Antibodies are found in 52-85% of patients with autoimmune hepatitis or chronic active hepatitis and in 22% of patients with primary biliary  cirrhosis. Performed At: [facility] [place], [place] [id number] Lindon Romp MD TZ:[id number]   Striated muscle antibody     Status: None   Collection Time: 10/08/17  2:23 PM  Result Value Ref Range   Anti-striation Abs Negative Neg:<1:40    Comment: (NOTE) Performed At: [facility] [address] [place], [place] [id number] Lindon Romp MD [id number]   Acetylcholine receptor, binding     Status: None   Collection Time: 10/08/17  2:23 PM  Result Value Ref Range   Acety choline binding ab <0.03 0.00 - 0.24 nmol/L    Comment: (NOTE)                               Negative:   0.00 - 0.24                               Borderline: 0.25 - 0.40                               Positive:        > 0.40 Performed At: [facility] [place], [place] [id number] Lindon Romp MD [id number]  Sedimentation rate     Status: None   Collection Time: 10/08/17  2:23 PM  Result Value Ref Range   Sed Rate 12 0 - 30 mm/hr  Vitamin B12     Status: None   Collection Time: 10/08/17  2:23 PM  Result Value Ref Range   Vitamin B-12 770 180 - 914 pg/mL    Comment: (NOTE) This assay is not validated for testing neonatal or myeloproliferative syndrome specimens for Vitamin B12 levels. Performed at [facility], [place] [address] [place], [place] [address]   WET PREP BY MOLECULAR PROBE     Status: Abnormal   Collection Time: 12/02/17  4:31 PM  Result Value Ref Range   MICRO NUMBER: [id number]    SPECIMEN QUALITY: ADEQUATE    SOURCE: VAG    STATUS: FINAL    Trichomonas vaginosis Not Detected    Gardnerella vaginalis (A)     Detected. Increased levels of G. vaginalis may not be significant in the absence of signs and symptoms of bacterial vaginosis.   Candida species Not Detected      Assessment & Plan  1. Urticaria - hydrOXYzine (ATARAX/VISTARIL) 25 MG tablet; Take 1 tablet (25 mg total) by mouth every 8 (eight) hours as  needed.  Dispense: 30 tablet; Refill: 0 - ranitidine (ZANTAC) 150 MG tablet; Take 1 tablet (150 mg total) by mouth 2 (two) times daily for 15 days.  Dispense: 30 tablet; Refill: 0 - Ambulatory referral to Allergy  -Red flags and when to present for emergency care or RTC including fever >101.5F, chest pain, shortness of breath, new/worsening/un-resolving symptoms, oral swelling, throat tightness, reviewed with patient at time of visit. Follow up and care instructions discussed and provided in AVS.

## 2018-01-05 NOTE — Telephone Encounter (Signed)
   Reason for Disposition . [1] MODERATE-SEVERE hives persist (i.e., hives interfere with normal activities or work) AND [2] taking antihistamine (e.g., Benadryl, Claritin) > 24 hours  Answer Assessment - Initial Assessment Questions 1. APPEARANCE: "What does the rash look like?"      Hives - red whelps 2. LOCATION: "Where is the rash located?"      Arms, trunk, legs  3. NUMBER: "How many hives are there?"      Too many to count 4. SIZE: "How big are the hives?" (inches, cm, compare to coins) "Do they all look the same or is there lots of variation in shape and size?"      Different sizes 5. ONSET: "When did the hives begin?" (Hours or days ago)      Friday 6. ITCHING: "Does it itch?" If so, ask: "How bad is the itch?"    - MILD: doesn't interfere with normal activities   - MODERATE-SEVERE: interferes with work, school, sleep, or other activities      Severe 7. RECURRENT PROBLEM: "Have you had hives before?" If so, ask: "When was the last time?" and "What happened that time?"      Yes - doctor gave me medicine 8. TRIGGERS: "Were you exposed to any new food, plant, cosmetic product or animal just before the hives began?"     No 9. OTHER SYMPTOMS: "Do you have any other symptoms?" (e.g., fever, tongue swelling, difficulty breathing, abdominal pain)     No 10. PREGNANCY: "Is there any chance you are pregnant?" "When was your last menstrual period?"       No  Protocols used: HIVES-A-AH   I ate some fresh pineapple Friday - maybe that was it." Reports hives "come and go." Taking Benadryl.

## 2018-01-05 NOTE — Patient Instructions (Addendum)
Hives Hives (urticaria) are itchy, red, swollen areas on your skin. Hives can show up on any part of your body, and they can vary in size. They can be as small as the tip of a pen or much larger. Hives often fade within 24 hours (acute hives). In other cases, new hives show up after old ones fade. This can continue for many days or weeks (chronic hives). Hives are caused by your body's reaction to an irritant or to something that you are allergic to (trigger). You can get hives right after being around a trigger or hours later. Hives do not spread from person to person (are not contagious). Hives may get worse if you scratch them, if you exercise, or if you have worries (emotional stress). Follow these instructions at home: Medicines  Take or apply over-the-counter and prescription medicines only as told by your doctor.  If you were prescribed an antibiotic medicine, use it as told by your doctor. Do not stop taking the antibiotic even if you start to feel better. Skin Care  Apply cool, wet cloths (cool compresses) to the itchy, red, swollen areas.  Do not scratch your skin. Do not rub your skin. General instructions  Do not take hot showers or baths. This can make itching worse.  Do not wear tight clothes.  Use sunscreen and wear clothing that covers your skin when you are outside.  Avoid any triggers that cause your hives. Keep a journal to help you keep track of what causes your hives. Write down: ? What medicines you take. ? What you eat and drink. ? What products you use on your skin.  Keep all follow-up visits as told by your doctor. This is important. Contact a doctor if:  Your symptoms are not better with medicine.  Your joints are painful or swollen. Get help right away if:  You have a fever.  You have belly pain.  Your tongue or lips are swollen.  Your eyelids are swollen.  Your chest or throat feels tight.  You have trouble breathing or swallowing. These  symptoms may be an emergency. Do not wait to see if the symptoms will go away. Get medical help right away. Call your local emergency services (911 in the [place] Do not drive yourself to the hospital. This information is not intended to replace advice given to you by your health care provider. Make sure you discuss any questions you have with your health care provider. Document Released: 09/24/2008 Document Revised: 05/23/2016 Document Reviewed: 10/04/2015 Elsevier Interactive Patient Education  2018 Elsevier Inc.  

## 2018-01-17 NOTE — Progress Notes (Deleted)
Cardiology Office Note  Date:  01/17/2018   ID:  Elizabeth Mata, DOB 12/23/1961, MRN [id number]  PCP:  Arnetha Courser, MD   No chief complaint on file.   HPI:    TIA 11/2017 Seizures Elizabeth Mata is a 57 y.o. May 14th 2012 for episodic spells three years of transient atypical neurologic spell,throbbing hemicranial headaches with dizziness, lightheadedness and at times loss of consciousness and awareness.   MRI of the brain and MRA of the head and neck revealed 60% stenosis on the right ICA and 30% stenosis in the left ICA, normal MRA of the head and small vessel disease in the brain.  treated empirically for possible basilar migraines versus simple partial seizures with topamax, done well   increased frequency of migraines to 1 per week over the past several months.  increased spells, increased stress due to the illness of her father.   dysesthesia in the hands and feet that began in July 2017.   evaluated by rheumatology internal medicine.  prediabetes otherwise normal.   tingling and numbness in hands and feet.   EMG/NCV and peripheral nerve ultrasound showed bilateral mild median neuropathies at the wrist.    presented to the [facility] ED on 10/08/2017   sudden onset extreme dizziness, imbalance, and nausea after getting out of bed in the morning.  could not walk and bumping into things.  She denied any accompanying focal weakness or numbness, double vision vertigo dysphagia, vomiting, LOC.  normal the night prior. Initial neuro exam with ataxia She did not improve after meclizine, benzo, zofran.  CT head NAICP. Not treated with TPA outside time window.  Basic labs normal and EKG NSR 64 bpm.  Symptoms resolved.  MRI and MRA brain  nonacute but notes remote small infarcts in the right thalamus and the basal ganglia.  MRA with patent anterior circulation and posterior circulation, does note left dominant vertebral artery, poorly visualized  right PICA, and small right posterior communicating artery. No MRA of the neck or carotid duplex was performed    Ha1c 5.7, MG panel, sed rate, B12, HIV normal. LDL 135 and TSH normal in 2017. She was started on aspirin and statin and d/c.    family hx of stroke. On discussion today she confirms the spell is as described above. She notes she has not experienced an event like this before. There was no headache associated with this event specifically although she does continue to have headaches. She has felt overall Ok since discharge.  intermittent weakness of the right leg. repeating herself more lately.   She is taking the aspirin  and has not yet started the statin.   Carotid   11/06/2017 <50% b/l  1. No acute intracranial infarct or other abnormality identified. 2. Small remote lacunar infarcts involving the right basal ganglia and right thalamus. 3. Otherwise normal brain MRI.    PMH:   has a past medical history of Allergic rhinitis (06/30/2016), Carotid atherosclerosis, bilateral (11/06/2017), Cyst of breast, left, solitary, Flank pain, GERD (gastroesophageal reflux disease), Hemorrhoid, IFG (impaired fasting glucose) (07/18/2016), Kidney stones, Neuropathy (07/24/2016), Seizures ([employer]), Shingles outbreak (02/03/2017), Syncope, and Trigger thumb.  PSH:    Past Surgical History:  Procedure Laterality Date  . ABDOMINAL HYSTERECTOMY  40s   endometriosis, fibroid tumors; no cancer, partial  . BREAST BIOPSY Left 1980'S   EXCISIONAL - NEG  . CHOLECYSTECTOMY  31  . FOOT SURGERY Right    tendon release    Current  Outpatient Medications  Medication Sig Dispense Refill  . aspirin EC 81 MG EC tablet Take 1 tablet (81 mg total) by mouth daily. 30 tablet 1  . atorvastatin (LIPITOR) 40 MG tablet Take 1 tablet (40 mg total) by mouth daily at 6 PM. 30 tablet 5  . ciclopirox (LOPROX) 0.77 % cream APPLY TOPICALLY 2 (TWO) TIMES DAILY. TO AFFECTED SKIN FOR UP TO 4 WEEKS  0  . fexofenadine (ALLEGRA)  180 MG tablet Take 1 tablet (180 mg total) by mouth daily as needed for allergies or rhinitis. 90 tablet 3  . fluticasone (FLONASE) 50 MCG/ACT nasal spray Place 2 sprays into both nostrils daily. (Patient taking differently: Place 2 sprays into both nostrils daily as needed. ) 16 g 6  . hydrOXYzine (ATARAX/VISTARIL) 25 MG tablet Take 1 tablet (25 mg total) by mouth every 8 (eight) hours as needed. 30 tablet 0  . ranitidine (ZANTAC) 150 MG tablet Take 1 tablet (150 mg total) by mouth 2 (two) times daily for 15 days. 30 tablet 0  . topiramate (TOPAMAX) 200 MG tablet TAKE 1 TABLET (200 MG TOTAL) BY MOUTH 2 (TWO) TIMES DAILY.  11  . triamcinolone ointment (KENALOG) 0.1 % APPLY ON THE SKIN TWICE A DAY AS NEEDED AS NEEDED RASH NOT TO FACE  1   No current facility-administered medications for this visit.      Allergies:   Clindamycin hcl; Clindamycin/lincomycin; Hydrocodone; Metronidazole; Sulfa antibiotics; and Oxycodone   Social History:  The patient  reports that  has never smoked. she has never used smokeless tobacco. She reports that she drinks alcohol. She reports that she does not use drugs.   Family History:   family history includes Arthritis in her maternal uncle and mother; Cancer in her father, maternal grandmother, and paternal grandmother; Coronary artery disease in her maternal uncle; Diabetes in her maternal uncle, paternal grandfather, and sister; Diabetes Mellitus II in her mother; Hypertension in her sister and sister; Jaundice in her maternal grandmother; Kidney cancer (age of onset: 24) in her father; Kidney disease in her father; Stomach cancer in her paternal grandmother; Stroke in her maternal grandfather.    Review of Systems: ROS   PHYSICAL EXAM: VS:  There were no vitals taken for this visit. , BMI There is no height or weight on file to calculate BMI. GEN: Well nourished, well developed, in no acute distress HEENT: normal Neck: no JVD, carotid bruits, or  masses Cardiac: RRR; no murmurs, rubs, or gallops,no edema  Respiratory:  clear to auscultation bilaterally, normal work of breathing GI: soft, nontender, nondistended, + BS MS: no deformity or atrophy Skin: warm and dry, no rash Neuro:  Strength and sensation are intact Psych: euthymic mood, full affect    Recent Labs: 10/08/2017: ALT 20; BUN 15; Creatinine, Ser 0.88; Hemoglobin 13.7; Platelets 277; Potassium 3.8; Sodium 142    Lipid Panel Lab Results  Component Value Date   CHOL 263 (H) 12/20/2016   HDL 116 12/20/2016   LDLCALC 135 (H) 12/20/2016   TRIG 60 12/20/2016      Wt Readings from Last 3 Encounters:  01/05/18 127 lb (57.6 kg)  12/02/17 127 lb 8 oz (57.8 kg)  10/27/17 125 lb 14.4 oz (57.1 kg)       ASSESSMENT AND PLAN:  No diagnosis found.   Disposition:   F/U  6 months  No orders of the defined types were placed in this encounter.    Mila Merry, M.D., Ph.D. 01/17/2018  [place]  [place], [place]

## 2018-01-19 ENCOUNTER — Ambulatory Visit: Payer: Medicare Other | Admitting: Cardiovascular Disease

## 2018-01-23 ENCOUNTER — Other Ambulatory Visit: Payer: Self-pay

## 2018-01-23 ENCOUNTER — Emergency Department: Payer: Medicare Other

## 2018-01-23 ENCOUNTER — Encounter: Payer: Self-pay | Admitting: Emergency Medicine

## 2018-01-23 ENCOUNTER — Emergency Department
Admission: EM | Admit: 2018-01-23 | Discharge: 2018-01-23 | Disposition: A | Discharge status: Home / Self Care | Payer: Medicare Other | Attending: Emergency Medicine | Admitting: Emergency Medicine

## 2018-01-23 DIAGNOSIS — R29818 Other symptoms and signs involving the nervous system: Secondary | ICD-10-CM | POA: Diagnosis not present

## 2018-01-23 DIAGNOSIS — I6523 Occlusion and stenosis of bilateral carotid arteries: Secondary | ICD-10-CM | POA: Diagnosis not present

## 2018-01-23 DIAGNOSIS — H538 Other visual disturbances: Secondary | ICD-10-CM | POA: Diagnosis not present

## 2018-01-23 DIAGNOSIS — Z8673 Personal history of transient ischemic attack (TIA), and cerebral infarction without residual deficits: Secondary | ICD-10-CM | POA: Diagnosis not present

## 2018-01-23 DIAGNOSIS — R51 Headache: Secondary | ICD-10-CM | POA: Insufficient documentation

## 2018-01-23 DIAGNOSIS — Z79899 Other long term (current) drug therapy: Secondary | ICD-10-CM | POA: Insufficient documentation

## 2018-01-23 DIAGNOSIS — R42 Dizziness and giddiness: Secondary | ICD-10-CM

## 2018-01-23 DIAGNOSIS — Z7982 Long term (current) use of aspirin: Secondary | ICD-10-CM | POA: Diagnosis not present

## 2018-01-23 DIAGNOSIS — R519 Headache, unspecified: Secondary | ICD-10-CM

## 2018-01-23 LAB — DIFFERENTIAL
Basophils Absolute: 0 10*3/uL (ref 0–0.1)
Basophils Relative: 0 %
Eosinophils Absolute: 0.1 10*3/uL (ref 0–0.7)
Eosinophils Relative: 2 %
Lymphocytes Relative: 61 %
Lymphs Abs: 2.6 10*3/uL (ref 1.0–3.6)
Monocytes Absolute: 0.4 10*3/uL (ref 0.2–0.9)
Monocytes Relative: 8 %
Neutro Abs: 1.2 10*3/uL — ABNORMAL LOW (ref 1.4–6.5)
Neutrophils Relative %: 29 %

## 2018-01-23 LAB — COMPREHENSIVE METABOLIC PANEL
ALT: 38 U/L (ref 14–54)
AST: 38 U/L (ref 15–41)
Albumin: 4.4 g/dL (ref 3.5–5.0)
Alkaline Phosphatase: 87 U/L (ref 38–126)
Anion gap: 11 (ref 5–15)
BUN: 15 mg/dL (ref 6–20)
CO2: 20 mmol/L — ABNORMAL LOW (ref 22–32)
Calcium: 9.8 mg/dL (ref 8.9–10.3)
Chloride: 109 mmol/L (ref 101–111)
Creatinine, Ser: 1.03 mg/dL — ABNORMAL HIGH (ref 0.44–1.00)
GFR calc Af Amer: >60 mL/min (ref >60)
GFR calc non Af Amer: >60 mL/min (ref >60)
Glucose, Bld: 91 mg/dL (ref 65–99)
Potassium: 3.4 mmol/L — ABNORMAL LOW (ref 3.5–5.1)
Sodium: 140 mmol/L (ref 135–145)
Total Bilirubin: 0.8 mg/dL (ref 0.3–1.2)
Total Protein: 7.5 g/dL (ref 6.5–8.1)

## 2018-01-23 LAB — CBC
HCT: 41.8 % (ref 35.0–47.0)
Hemoglobin: 14.5 g/dL (ref 12.0–16.0)
MCH: 32.2 pg (ref 26.0–34.0)
MCHC: 34.5 g/dL (ref 32.0–36.0)
MCV: 93.3 fL (ref 80.0–100.0)
Platelets: 300 10*3/uL (ref 150–440)
RBC: 4.48 MIL/uL (ref 3.80–5.20)
RDW: 13.4 % (ref 11.5–14.5)
WBC: 4.3 10*3/uL (ref 3.6–11.0)

## 2018-01-23 LAB — PROTIME-INR
INR: 0.98
Prothrombin Time: 12.9 seconds (ref 11.4–15.2)

## 2018-01-23 LAB — APTT: aPTT: 30 seconds (ref 24–36)

## 2018-01-23 LAB — GLUCOSE, CAPILLARY: Glucose-Capillary: 68 mg/dL (ref 65–99)

## 2018-01-23 LAB — TROPONIN I: Troponin I: <0.03 ng/mL (ref <0.03)

## 2018-01-23 MED ORDER — KETOROLAC TROMETHAMINE 30 MG/ML IJ SOLN
15.0000 mg | Freq: Once | INTRAMUSCULAR | Status: AC
  Administered 2018-01-23: 15 mg via INTRAVENOUS
  Filled 2018-01-23: qty 1

## 2018-01-23 MED ORDER — ASPIRIN 81 MG PO CHEW
324.0000 mg | CHEWABLE_TABLET | Freq: Once | ORAL | Status: AC
  Administered 2018-01-23: 324 mg via ORAL
  Filled 2018-01-23: qty 4

## 2018-01-23 MED ORDER — METOCLOPRAMIDE HCL 5 MG/ML IJ SOLN
10.0000 mg | Freq: Once | INTRAMUSCULAR | Status: AC
  Administered 2018-01-23: 10 mg via INTRAVENOUS
  Filled 2018-01-23: qty 2

## 2018-01-23 MED ORDER — IOPAMIDOL (ISOVUE-370) INJECTION 76%
75.0000 mL | Freq: Once | INTRAVENOUS | Status: AC | PRN
  Administered 2018-01-23: 75 mL via INTRAVENOUS

## 2018-01-23 NOTE — ED Provider Notes (Signed)
[facility] Emergency Department Provider Note   ____________________________________________   First MD Initiated Contact with Patient 01/23/18 1110     (approximate)  I have reviewed the triage vital signs and the nursing notes.   HISTORY  Chief Complaint Code Stroke    HPI Elizabeth Mata is a 57 y.o. female previous history of migraine, also history of small stroke and seizures  Patient reports that since Wednesday she had a slight headache, seems to be progressing, throbbing in nature, associated with nausea but no vomiting.  Feeling a little lightheaded today.  Reports headache is been steadily worsening.  Denies new onset of weakness.  Does feel like her vision seems a little "blurry" off and on at times.  No abdominal pain.  No fevers or chills.  No chest pain or trouble breathing.  Reports she takes Topamax for migraine headache prevention  Past Medical History:  Diagnosis Date  . Allergic rhinitis 06/30/2016  . Carotid atherosclerosis, bilateral 11/06/2017   [place] Nov 2018  . Cyst of breast, left, solitary   . Flank pain   . GERD (gastroesophageal reflux disease)   . Hemorrhoid   . IFG (impaired fasting glucose) 07/18/2016   June 2017   . Kidney stones   . Neuropathy 07/24/2016  . Seizures ([place])   . Shingles outbreak 02/03/2017  . Syncope    Recurrent over the last 2 years. had workup at [facility] this year with cardiology and neurology. Had echo and 3-week event monitor that were unremarkable. Has had negative EEG. Echo in 11/11 at [facility] showed no significant abnormalities. Possibly vasovagal  . Trigger thumb     Patient Active Problem List   Diagnosis Date Noted  . Urticaria 01/05/2018  . Carotid atherosclerosis, bilateral 11/06/2017  . TIA (transient ischemic attack) 10/28/2017  . Hx of completed stroke 10/28/2017  . Ataxia 10/08/2017  . Breast pain in female 07/11/2017  . Medication monitoring encounter 12/20/2016  . Family  health problem 12/20/2016  . Abnormality of globulin 07/26/2016  . Neuropathy 07/24/2016  . 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 IFG (impaired fasting glucose) 07/18/2016  . Hematuria 07/03/2016  . Allergic rhinitis 06/30/2016  . Chronic left hip pain 06/28/2016  . Breast cancer screening 04/17/2016  . Skin texture changes 12/27/2015  . Vaginal discharge 10/31/2015  . Screening for STD (sexually transmitted disease) 10/31/2015  . Kidney stone on left side 10/24/2015  . Annual physical exam 09/13/2015  . Encounter for screening mammogram for malignant neoplasm of breast 09/13/2015  . Need for immunization against influenza 09/13/2015  . Eczema 09/13/2015  . Irritable bowel syndrome with constipation 09/13/2015  . Seizures ([place]) 08/16/2015  . Carpal tunnel syndrome 07/26/2014  . Carotid artery narrowing 07/05/2013  . 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 ABDOMINAL HYSTERECTOMY  40s   endometriosis, fibroid tumors; no cancer, partial  . BREAST BIOPSY Left 1980'S   EXCISIONAL - NEG  . CHOLECYSTECTOMY  31  . FOOT SURGERY Right    tendon release    Prior to Admission medications   Medication Sig Start Date End Date Taking? Authorizing Provider  aspirin EC 81 MG EC tablet Take 1 tablet (81 mg total) by mouth daily. 10/10/17  Yes Henreitta Leber, MD  atorvastatin (LIPITOR) 40 MG tablet Take 1 tablet (40 mg total) by mouth daily at 6 PM. 12/02/17  Yes Lada, Satira Anis, MD  fexofenadine (ALLEGRA) 180 MG tablet Take 1  tablet (180 mg total) by mouth daily as needed for allergies or rhinitis. 07/11/17  Yes Lada, Satira Anis, MD  topiramate (TOPAMAX) 200 MG tablet TAKE 1 TABLET (200 MG TOTAL) BY MOUTH 2 (TWO) TIMES DAILY. 01/23/17  Yes [provider]  ciclopirox (LOPROX) 0.77 % cream APPLY TOPICALLY 2 (TWO) TIMES DAILY. TO AFFECTED SKIN FOR UP TO 4 WEEKS 08/22/17   [provider]  fluticasone (FLONASE) 50 MCG/ACT nasal  spray Place 2 sprays into both nostrils daily. Patient not taking: Reported on 01/23/2018 05/16/17   Arnetha Courser, MD  hydrOXYzine (ATARAX/VISTARIL) 25 MG tablet Take 1 tablet (25 mg total) by mouth every 8 (eight) hours as needed. Patient not taking: Reported on 01/23/2018 01/05/18   Hubbard Hartshorn, FNP  ranitidine (ZANTAC) 150 MG tablet Take 1 tablet (150 mg total) by mouth 2 (two) times daily for 15 days. Patient not taking: Reported on 01/23/2018 01/05/18 01/20/18  Hubbard Hartshorn, FNP  triamcinolone ointment (KENALOG) 0.1 % APPLY ON THE SKIN TWICE A DAY AS NEEDED AS NEEDED RASH NOT TO FACE 10/15/17   [provider]    Allergies Clindamycin hcl; Clindamycin/lincomycin; Hydrocodone; Metronidazole; Sulfa antibiotics; and Oxycodone  Family History  Problem Relation Age of Onset  . Coronary artery disease Maternal Uncle   . Diabetes Mellitus II Mother   . Arthritis Mother   . Diabetes Maternal Uncle   . Kidney cancer Father 64       died 22-Apr-2017  . Kidney disease Father   . Cancer Father        kidney and bladder  . Arthritis Maternal Uncle   . Cancer Maternal Grandmother        stomach?  . Jaundice Maternal Grandmother   . Stomach cancer Paternal Grandmother   . Cancer Paternal Grandmother        stomach  . Hypertension Sister   . Stroke Maternal Grandfather   . Diabetes Paternal Grandfather   . Diabetes Sister   . Hypertension Sister   . Breast cancer Neg Hx   . Bladder Cancer Neg Hx     Social History Social History   Tobacco Use  . Smoking status: Never Smoker  . Smokeless tobacco: Never Used  Substance Use Topics  . Alcohol use: Yes    Alcohol/week: 0.0 oz    Comment: occ  . Drug use: No    Review of Systems Constitutional: No fever/chills Eyes: See HPI ENT: No sore throat. Cardiovascular: Denies chest pain. Respiratory: Denies shortness of breath. Gastrointestinal: No abdominal pain.  No nausea, no vomiting.  No diarrhea.  No  constipation. Genitourinary: Negative for dysuria. Musculoskeletal: Negative for back pain. Skin: Negative for rash. Neurological: Negative for focal weakness or numbness.  No trouble speaking.    ____________________________________________   PHYSICAL EXAM:  VITAL SIGNS: ED Triage Vitals  Enc Vitals Group     BP      Pulse      Resp      Temp      Temp src      SpO2      Weight      Height      Head Circumference      Peak Flow      Pain Score      Pain Loc      Pain Edu?      Excl. in [place]?     Constitutional: Alert and oriented. Well appearing and in no acute distress. Eyes:  Conjunctivae are normal. Head: Atraumatic. Nose: No congestion/rhinnorhea. Mouth/Throat: Mucous membranes are moist. Neck: No stridor.   Cardiovascular: Normal rate, regular rhythm. Grossly normal heart sounds.  Good peripheral circulation. Respiratory: Normal respiratory effort.  No retractions. Lungs CTAB. Gastrointestinal: Soft and nontender. No distention. Musculoskeletal: No lower extremity tenderness nor edema. Neurologic:  Normal speech and language. No gross focal neurologic deficits are appreciated.  5 out of 5 strength all extremities.  No ataxia.  Extraocular movements intact. Skin:  Skin is warm, dry and intact. No rash noted. Psychiatric: Mood and affect are normal. Speech and behavior are normal.  ____________________________________________   LABS (all labs ordered are listed, but only abnormal results are displayed)  Labs Reviewed  DIFFERENTIAL - Abnormal; Notable for the following components:      Result Value   Neutro Abs 1.2 (*)    All other components within normal limits  COMPREHENSIVE METABOLIC PANEL - Abnormal; Notable for the following components:   Potassium 3.4 (*)    CO2 20 (*)    Creatinine, Ser 1.03 (*)    All other components within normal limits  PROTIME-INR  APTT  CBC  TROPONIN I  GLUCOSE, CAPILLARY  CBG MONITORING, ED    ____________________________________________  EKG  Reviewed interrupt me at 11:20 AM Heart rate 60 QTC QRS and QT normal Normal sinus rhythm no evidence of ischemia or ectopy ____________________________________________  RADIOLOGY  Ct Angio Head W Or Wo Contrast  Result Date: 01/23/2018 CLINICAL DATA:  Headache, dizziness, nausea, and visual disturbance. History of TIA and migraines. EXAM: CT ANGIOGRAPHY HEAD AND NECK TECHNIQUE: Multidetector CT imaging of the head and neck was performed using the standard protocol during bolus administration of intravenous contrast. Multiplanar CT image reconstructions and MIPs were obtained to evaluate the vascular anatomy. Carotid stenosis measurements (when applicable) are obtained utilizing NASCET criteria, using the distal internal carotid diameter as the denominator. CONTRAST:  90mL ISOVUE-370 IOPAMIDOL (ISOVUE-370) INJECTION 76% COMPARISON:  Head CT today.  Head MRI/MRA 10/08/2017. FINDINGS: CTA NECK FINDINGS Aortic arch: Standard 3 vessel aortic arch. Widely patent brachiocephalic and subclavian arteries. Right carotid system: Patent without evidence of stenosis or dissection. Left carotid system: Patent without evidence of stenosis or dissection. Vertebral arteries: Patent without evidence of stenosis or dissection. Slightly dominant left vertebral artery. Skeleton: No suspicious osseous lesion. Advanced right-sided facet arthrosis at C7-T1. Other neck: No mass or lymph node enlargement. Upper chest: Clear lung apices. Review of the MIP images confirms the above findings CTA HEAD FINDINGS Anterior circulation: The internal carotid arteries are widely patent from skull base to carotid termini. ACAs and MCAs are patent without evidence of proximal branch occlusion or significant stenosis. No aneurysm. Posterior circulation: The intracranial vertebral arteries are widely patent to the basilar. Patent left PICA, right larger than left AICA, and bilateral SCA  origins are identified. The basilar artery is widely patent. There are small right and likely diminutive left posterior communicating arteries. PCAs are patent without evidence of stenosis. No aneurysm. Venous sinuses: Patent. Anatomic variants: None. Delayed phase: No abnormal enhancement. Review of the MIP images confirms the above findings IMPRESSION: Negative head and neck CTA. Electronically Signed   By: Logan Bores M.D.   On: 01/23/2018 13:27   Ct Angio Neck W And/or Wo Contrast  Result Date: 01/23/2018 CLINICAL DATA:  Headache, dizziness, nausea, and visual disturbance. History of TIA and migraines. EXAM: CT ANGIOGRAPHY HEAD AND NECK TECHNIQUE: Multidetector CT imaging of the head and neck was performed using the standard  protocol during bolus administration of intravenous contrast. Multiplanar CT image reconstructions and MIPs were obtained to evaluate the vascular anatomy. Carotid stenosis measurements (when applicable) are obtained utilizing NASCET criteria, using the distal internal carotid diameter as the denominator. CONTRAST:  58mL ISOVUE-370 IOPAMIDOL (ISOVUE-370) INJECTION 76% COMPARISON:  Head CT today.  Head MRI/MRA 10/08/2017. FINDINGS: CTA NECK FINDINGS Aortic arch: Standard 3 vessel aortic arch. Widely patent brachiocephalic and subclavian arteries. Right carotid system: Patent without evidence of stenosis or dissection. Left carotid system: Patent without evidence of stenosis or dissection. Vertebral arteries: Patent without evidence of stenosis or dissection. Slightly dominant left vertebral artery. Skeleton: No suspicious osseous lesion. Advanced right-sided facet arthrosis at C7-T1. Other neck: No mass or lymph node enlargement. Upper chest: Clear lung apices. Review of the MIP images confirms the above findings CTA HEAD FINDINGS Anterior circulation: The internal carotid arteries are widely patent from skull base to carotid termini. ACAs and MCAs are patent without evidence of proximal  branch occlusion or significant stenosis. No aneurysm. Posterior circulation: The intracranial vertebral arteries are widely patent to the basilar. Patent left PICA, right larger than left AICA, and bilateral SCA origins are identified. The basilar artery is widely patent. There are small right and likely diminutive left posterior communicating arteries. PCAs are patent without evidence of stenosis. No aneurysm. Venous sinuses: Patent. Anatomic variants: None. Delayed phase: No abnormal enhancement. Review of the MIP images confirms the above findings IMPRESSION: Negative head and neck CTA. Electronically Signed   By: Logan Bores M.D.   On: 01/23/2018 13:27   Ct Head Code Stroke Wo Contrast  Result Date: 01/23/2018 CLINICAL DATA:  Code stroke. Headache beginning 2 days ago with progression today. Previous TIAs. EXAM: CT HEAD WITHOUT CONTRAST TECHNIQUE: Contiguous axial images were obtained from the base of the skull through the vertex without intravenous contrast. COMPARISON:  None. FINDINGS: Brain: No acute infarct, hemorrhage, or mass lesion is present. The ventricles are of normal size. No significant extra-axial fluid collection is present. The brainstem and cerebellum are within normal limits. Vascular: No hyperdense vessel or unexpected calcification. Skull: The calvarium is intact. No focal lytic or blastic lesion is present. Sinuses/Orbits: The paranasal sinuses and mastoid air cells are clear. Globes and orbits are within normal limits. ASPECTS [facility] Stroke Program Early CT Score) - Ganglionic level infarction (caudate, lentiform nuclei, internal capsule, insula, M1-M3 cortex): 7/7 - Supraganglionic infarction (M4-M6 cortex): 3/3 Total score (0-10 with 10 being normal): 10/10 IMPRESSION: 1. Negative CT of the brain 2. ASPECTS is 10/10 These results were called by telephone at the time of interpretation on 01/23/2018 at 11:12 am to Dr. Eula Listen , who verbally acknowledged these results.  Electronically Signed   By: San Morelle M.D.   On: 01/23/2018 11:14    ____________________________________________   PROCEDURES  Procedure(s) performed: None  Procedures   Critical Care performed: Yes, see critical care note(s)  CRITICAL CARE Performed by: Delman Kitten   Total critical care time: 35 minutes  Critical care time was exclusive of separately billable procedures and treating other patients.  Critical care was necessary to treat or prevent imminent or life-threatening deterioration.  Critical care was time spent personally by me on the following activities: development of treatment plan with patient and/or surrogate as well as nursing, discussions with consultants, evaluation of patient's response to treatment, examination of patient, obtaining history from patient or surrogate, ordering and performing treatments and interventions, ordering and review of laboratory studies, ordering and review of radiographic  studies, pulse oximetry and re-evaluation of patient's condition.  Patient was seen in conjunction with stroke team Dr. Doy Mince.  Patient was activated as a code stroke triage for concerns of vision changes with associated nausea and a previous history of stroke.  Patient required rapid evaluation given the stroke activation ____________________________________________   INITIAL IMPRESSION / ASSESSMENT AND PLAN / ED COURSE  Pertinent labs & imaging results that were available during my care of the patient were reviewed by me and considered in my medical decision making (see chart for details).  Patient returns for evaluation of a headache with associated nausea vision changes.  On exam no evidence of neurologic deficits.  She does report headache feeling nauseated and as though her vision has been fuzzy at times but now improved.  She does have carry and reports a history of migraines, this sounds suspiciously more like a migraine in the stroke.  Was not  sudden onset not associated any neck pain or meningismus, afebrile.  At this point, seen by neurology, recommending a CT Angie of the head and neck as well as treatment for migraine including Toradol and Reglan.  Plan to follow-up with final recommendations from neurology, Dr. Doy Mince reports she would like to reevaluate the patient after providing headache medication and CTA.  Normal NH, no evidence of neuro deficits, no indication for TPA.  Clinical Course as of Jan 23 1433  Fri Jan 23, 2018  1347 Patient feeling improved, headache is improved now with just a minimal discomfort.  [MQ]  1357 Discussed with Dr. Doy Mince, Dr. Doy Mince recommends patient may be discharged to home. Outpatient neurology follow-up. No med changes. Continue her daily asa regimen.   [MQ]    Clinical Course User Index [MQ] Delman Kitten, MD   ----------------------------------------- 2:33 PM on 01/23/2018 -----------------------------------------  Patient feeling improved, resting comfortably.  Ambulating without distress.  Fully awake and alert.  She reports her headache is decreased significantly feels well.  Discussed with Dr. Doy Mince, patient will be discharged home.  She will follow closely with her neurologist, no new prescriptions.  Advised patient not to drive this afternoon she was given Reglan which can cause her to feel dizzy or sleepy.  Patient in agreement.  Will contact family or taxi for ride home.  ____________________________________________   FINAL CLINICAL IMPRESSION(S) / ED DIAGNOSES  Final diagnoses:  Recurrent headache      NEW MEDICATIONS STARTED DURING THIS VISIT:  New Prescriptions   No medications on file     Note:  This document was prepared using Dragon voice recognition software and may include unintentional dictation errors.     Delman Kitten, MD 01/23/18 1434

## 2018-01-23 NOTE — ED Notes (Signed)
Pt cgb 69, stroke swallow screen done, pt given juice

## 2018-01-23 NOTE — ED Notes (Signed)
Pt back from CT. Checked on pt. No needs at this time

## 2018-01-23 NOTE — Progress Notes (Signed)
°   01/23/18 1120  Clinical Encounter Type  Visited With Patient  Visit Type Initial  Referral From Nurse  Consult/Referral To Chaplain  Spiritual Encounters  Spiritual Needs Prayer;Emotional   [place] received a Code Stroke PG. [place] reported to PT's RM and found PT in good spirits. [place] prayed with PT. CH will follow up as needed.

## 2018-01-23 NOTE — ED Triage Notes (Signed)
First Nurse Note:  Arrives with C/O headache and dizziness.  Onset of symptoms yesterday.  Patient is AAOx3.  Skin warm and dry.  Patient is anxious.

## 2018-01-23 NOTE — ED Notes (Signed)
Pt calling for ride

## 2018-01-23 NOTE — Discharge Instructions (Signed)
You have been seen in the Emergency Department (ED) for a headache.   ° °As we have discussed, please follow up with your primary care doctor as soon as possible regarding today?s Emergency Department (ED) visit and your headache symptoms.   ° °Call your doctor or return to the ED if you have a worsening headache, sudden and severe headache, confusion, slurred speech, facial droop, weakness or numbness in any arm or leg, extreme fatigue, vision problems, or other symptoms that concern you. ° °

## 2018-01-23 NOTE — ED Triage Notes (Signed)
Pt arrived via POV by herself, pt states she had HA that started Wednesday, but is has increased in intensity.Pt also states she had dizziness that started around 9am with nausea and states her vision has been funny since 9am this morning. Pt has hx of TIA last in Oct.  Pt states she does have hx of migraines and takes Topamax.

## 2018-01-23 NOTE — Consult Note (Signed)
Referring Physician: Quale    Chief Complaint: Headache, dizziness, blurry vision  HPI: Elizabeth Mata is an 57 y.o. female with a history of headaches who reports that on Wednesday she had acute onset of an occipital headache.  Describes the headache as sharp.  This headache continued through to Thursday when she had onset of some associated blurry vision and dizziness as well.  This morning after awakening her headache became more severe her dizziness and blurry vision became more severe as well.  The patient presented at that time for evaluation.  Patient was able to drive.  She continues to complain of severe headache.  Initial NIH stroke scale of 1 for a mild right facial droop that is old. Patient seen in October of last year with a similar presentation.  Workup at that time was unremarkable for stroke.  Patient was started on aspirin a day.  Date last known well: Date: 01/22/2018 Time last known well: Time: 12:00 tPA Given: No: Outside time window  Past Medical History:  Diagnosis Date  . Allergic rhinitis 06/30/2016  . Carotid atherosclerosis, bilateral 11/06/2017   [place] Nov 2018  . Cyst of breast, left, solitary   . Flank pain   . GERD (gastroesophageal reflux disease)   . Hemorrhoid   . IFG (impaired fasting glucose) 07/18/2016   June 2017   . Kidney stones   . Neuropathy 07/24/2016  . Seizures ([place])   . Shingles outbreak 02/03/2017  . Syncope    Recurrent over the last 2 years. had workup at [facility] this year with cardiology and neurology. Had echo and 3-week event monitor that were unremarkable. Has had negative EEG. Echo in 11/11 at [facility] showed no significant abnormalities. Possibly vasovagal  . Trigger thumb     Past Surgical History:  Procedure Laterality Date  . ABDOMINAL HYSTERECTOMY  40s   endometriosis, fibroid tumors; no cancer, partial  . BREAST BIOPSY Left 1980'S   EXCISIONAL - NEG  . CHOLECYSTECTOMY  31  . FOOT SURGERY Right    tendon release    Family  History  Problem Relation Age of Onset  . Coronary artery disease Maternal Uncle   . Diabetes Mellitus II Mother   . Arthritis Mother   . Diabetes Maternal Uncle   . Kidney cancer Father 3       died 2017/04/21  . Kidney disease Father   . Cancer Father        kidney and bladder  . Arthritis Maternal Uncle   . Cancer Maternal Grandmother        stomach?  . Jaundice Maternal Grandmother   . Stomach cancer Paternal Grandmother   . Cancer Paternal Grandmother        stomach  . Hypertension Sister   . Stroke Maternal Grandfather   . Diabetes Paternal Grandfather   . Diabetes Sister   . Hypertension Sister   . Breast cancer Neg Hx   . Bladder Cancer Neg Hx    Social History:  reports that  has never smoked. she has never used smokeless tobacco. She reports that she drinks alcohol. She reports that she does not use drugs.  Allergies:  Allergies  Allergen Reactions  . Clindamycin Hcl Hives  . Clindamycin/Lincomycin Hives  . Hydrocodone Nausea Only  . Metronidazole   . Sulfa Antibiotics Hives    Break out in hives all over  . Oxycodone Nausea And Vomiting    Medications: I have reviewed the patient's current medications. Prior to  Admission:  Prior to Admission medications   Medication Sig Start Date End Date Taking? Authorizing Provider  aspirin EC 81 MG EC tablet Take 1 tablet (81 mg total) by mouth daily. 10/10/17  Yes Henreitta Leber, MD  atorvastatin (LIPITOR) 40 MG tablet Take 1 tablet (40 mg total) by mouth daily at 6 PM. 12/02/17  Yes Lada, Satira Anis, MD  fexofenadine (ALLEGRA) 180 MG tablet Take 1 tablet (180 mg total) by mouth daily as needed for allergies or rhinitis. 07/11/17  Yes Lada, Satira Anis, MD  topiramate (TOPAMAX) 200 MG tablet TAKE 1 TABLET (200 MG TOTAL) BY MOUTH 2 (TWO) TIMES DAILY. 01/23/17  Yes [provider]  ciclopirox (LOPROX) 0.77 % cream APPLY TOPICALLY 2 (TWO) TIMES DAILY. TO AFFECTED SKIN FOR UP TO 4 WEEKS 08/22/17   [provider]  fluticasone (FLONASE) 50 MCG/ACT nasal spray Place 2 sprays into both nostrils daily. Patient not taking: Reported on 01/23/2018 05/16/17   Arnetha Courser, MD  hydrOXYzine (ATARAX/VISTARIL) 25 MG tablet Take 1 tablet (25 mg total) by mouth every 8 (eight) hours as needed. Patient not taking: Reported on 01/23/2018 01/05/18   Hubbard Hartshorn, FNP  ranitidine (ZANTAC) 150 MG tablet Take 1 tablet (150 mg total) by mouth 2 (two) times daily for 15 days. Patient not taking: Reported on 01/23/2018 01/05/18 01/20/18  Hubbard Hartshorn, FNP  triamcinolone ointment (KENALOG) 0.1 % APPLY ON THE SKIN TWICE A DAY AS NEEDED AS NEEDED RASH NOT TO FACE 10/15/17   [provider]     ROS: History obtained from the patient  General ROS: negative for - chills, fatigue, fever, night sweats, weight gain or weight loss Psychological ROS: negative for - behavioral disorder, hallucinations, memory difficulties, mood swings or suicidal ideation Ophthalmic ROS: as noted in HPI ENT ROS: as noted in HPI Allergy and Immunology ROS: negative for - hives or itchy/watery eyes Hematological and Lymphatic ROS: negative for - bleeding problems, bruising or swollen lymph nodes Endocrine ROS: negative for - galactorrhea, hair pattern changes, polydipsia/polyuria or temperature intolerance Respiratory ROS: negative for - cough, hemoptysis, shortness of breath or wheezing Cardiovascular ROS: negative for - chest pain, dyspnea on exertion, edema or irregular heartbeat Gastrointestinal ROS: negative for - abdominal pain, diarrhea, hematemesis, nausea/vomiting or stool incontinence Genito-Urinary ROS: negative for - dysuria, hematuria, incontinence or urinary frequency/urgency Musculoskeletal ROS: negative for - joint swelling or muscular weakness Neurological ROS: as noted in HPI Dermatological ROS: negative for rash and skin lesion changes  Physical Examination: There were no vitals taken for this  visit.  HEENT-  Normocephalic, no lesions, without obvious abnormality.  Normal external eye and conjunctiva.  Normal TM's bilaterally.  Normal auditory canals and external ears. Normal external nose, mucus membranes and septum.  Normal pharynx. Cardiovascular- S1, S2 normal, pulses palpable throughout   Lungs- chest clear, no wheezing, rales, normal symmetric air entry Abdomen- soft, non-tender; bowel sounds normal; no masses,  no organomegaly Extremities- no edema Lymph-no adenopathy palpable Musculoskeletal-no joint tenderness, deformity or swelling Skin-warm and dry, no hyperpigmentation, vitiligo, or suspicious lesions  Neurological Examination   Mental Status: Alert, oriented, thought content appropriate.  Speech fluent without evidence of aphasia.  Able to follow 3 step commands without difficulty. Cranial Nerves: II: Discs flat bilaterally; Visual fields grossly normal, pupils equal, round, reactive to light and accommodation III,IV, VI: ptosis not present, extra-ocular motions intact bilaterally V,VII: mild right facial droop, facial light touch sensation normal bilaterally VIII: hearing normal bilaterally  IX,X: gag reflex present XI: bilateral shoulder shrug XII: midline tongue extension Motor: Right : Upper extremity   5/5    Left:     Upper extremity   5/5  Lower extremity   5/5     Lower extremity   5/5 Tone and bulk:normal tone throughout; no atrophy noted Sensory: Pinprick and light touch intact throughout, bilaterally Deep Tendon Reflexes: 2+ and symmetric throughout Plantars: Right: downgoing   Left: downgoing Cerebellar: normal finger-to-nose, normal rapid alternating movements and normal heel-to-shin test Gait: normal gait and station    Laboratory Studies:  Basic Metabolic Panel: No results for input(s): NA, K, CL, CO2, GLUCOSE, BUN, CREATININE, CALCIUM, MG, PHOS in the last 168 hours.  Liver Function Tests: No results for input(s): AST, ALT, ALKPHOS,  BILITOT, PROT, ALBUMIN in the last 168 hours. No results for input(s): LIPASE, AMYLASE in the last 168 hours. No results for input(s): AMMONIA in the last 168 hours.  CBC: Recent Labs  Lab 01/23/18 1050  WBC 4.3  NEUTROABS 1.2*  HGB 14.5  HCT 41.8  MCV 93.3  PLT 300    Cardiac Enzymes: No results for input(s): CKTOTAL, CKMB, CKMBINDEX, TROPONINI in the last 168 hours.  BNP: Invalid input(s): POCBNP  CBG: Recent Labs  Lab 01/23/18 1116  GLUCAP 68    Microbiology: Results for orders placed or performed in visit on 12/02/17  WET PREP BY MOLECULAR PROBE     Status: Abnormal   Collection Time: 12/02/17  4:31 PM  Result Value Ref Range Status   MICRO NUMBER: [id number]  Final   SPECIMEN QUALITY: ADEQUATE  Final   SOURCE: VAG  Final   STATUS: FINAL  Final   Trichomonas vaginosis Not Detected  Final   Gardnerella vaginalis (A)  Final    Detected. Increased levels of G. vaginalis may not be significant in the absence of signs and symptoms of bacterial vaginosis.   Candida species Not Detected  Final    Coagulation Studies: Recent Labs    01/23/18 1050  LABPROT 12.9  INR 0.98    Urinalysis: No results for input(s): COLORURINE, LABSPEC, PHURINE, GLUCOSEU, HGBUR, BILIRUBINUR, KETONESUR, PROTEINUR, UROBILINOGEN, NITRITE, LEUKOCYTESUR in the last 168 hours.  Invalid input(s): APPERANCEUR  Lipid Panel:    Component Value Date/Time   CHOL 263 (H) 12/20/2016 1033   TRIG 60 12/20/2016 1033   HDL 116 12/20/2016 1033   CHOLHDL 2.3 12/20/2016 1033   VLDL 12 12/20/2016 1033   LDLCALC 135 (H) 12/20/2016 1033    HgbA1C:  Lab Results  Component Value Date   HGBA1C 5.7 (H) 10/08/2017    Urine Drug Screen:  No results found for: LABOPIA, COCAINSCRNUR, LABBENZ, AMPHETMU, THCU, LABBARB  Alcohol Level: No results for input(s): ETH in the last 168 hours.   Imaging: Ct Head Code Stroke Wo Contrast  Result Date: 01/23/2018 CLINICAL DATA:  Code stroke. Headache beginning 2  days ago with progression today. Previous TIAs. EXAM: CT HEAD WITHOUT CONTRAST TECHNIQUE: Contiguous axial images were obtained from the base of the skull through the vertex without intravenous contrast. COMPARISON:  None. FINDINGS: Brain: No acute infarct, hemorrhage, or mass lesion is present. The ventricles are of normal size. No significant extra-axial fluid collection is present. The brainstem and cerebellum are within normal limits. Vascular: No hyperdense vessel or unexpected calcification. Skull: The calvarium is intact. No focal lytic or blastic lesion is present. Sinuses/Orbits: The paranasal sinuses and mastoid air cells are clear. Globes and orbits are within normal limits. ASPECTS (  [place] Stroke Program Early CT Score) - Ganglionic level infarction (caudate, lentiform nuclei, internal capsule, insula, M1-M3 cortex): 7/7 - Supraganglionic infarction (M4-M6 cortex): 3/3 Total score (0-10 with 10 being normal): 10/10 IMPRESSION: 1. Negative CT of the brain 2. ASPECTS is 10/10 These results were called by telephone at the time of interpretation on 01/23/2018 at 11:12 am to Dr. Eula Listen , who verbally acknowledged these results. Electronically Signed   By: San Morelle M.D.   On: 01/23/2018 11:14    Assessment: 57 y.o. female presenting with headache dizziness and blurred vision.  Has had a similar presentation in the past with a negative stroke workup.  Patient on aspirin a day.  Head CT reviewed and shows no evidence of acute stroke.  Concerned that this may be sequelae from migraine.  Will rule out the possibility of a posterior circulation thrombus.  Further workup recommended.  Stroke Risk Factors - none  Plan: 1.  CTA of head and neck.  CTA of head and neck is unremarkable we will treat for migraine.  Suspect symptoms will improve as headache improves.  2. Agree with Reglan and Toradol for headache abortion.    Case discussed with Dr. Jacqualine Code.  Alexis Goodell,  MD Neurology [phone] 01/23/2018, 11:19 AM  Addendum: CTA of head and neck reviewed and shows no evidence of large vessel occlusion.  Patient treated with Reglan and Toradol.  Headache improved and symptoms improved.  Recommendations: 1.  Patient to continue aspirin daily 2.  Patient to continue follow-up with neurology on an outpatient basis  Alexis Goodell, MD Neurology [phone]

## 2018-02-12 ENCOUNTER — Ambulatory Visit: Payer: Medicare Other | Admitting: Allergy

## 2018-02-14 NOTE — Progress Notes (Signed)
Closing out lab/order note open since:  07/07/17

## 2018-02-14 NOTE — Progress Notes (Signed)
Closing out lab/order note open since:  12/02/17

## 2018-02-14 NOTE — Progress Notes (Signed)
Closing out lab/order note open since:  07/24/17

## 2018-02-21 ENCOUNTER — Encounter (HOSPITAL_COMMUNITY): Payer: Self-pay | Admitting: Emergency Medicine

## 2018-02-21 ENCOUNTER — Emergency Department (HOSPITAL_COMMUNITY)
Admission: EM | Admit: 2018-02-21 | Discharge: 2018-02-21 | Disposition: A | Discharge status: Home / Self Care | Payer: Medicare Other | Attending: Emergency Medicine | Admitting: Emergency Medicine

## 2018-02-21 ENCOUNTER — Other Ambulatory Visit: Payer: Self-pay

## 2018-02-21 DIAGNOSIS — Z7982 Long term (current) use of aspirin: Secondary | ICD-10-CM | POA: Diagnosis not present

## 2018-02-21 DIAGNOSIS — R569 Unspecified convulsions: Secondary | ICD-10-CM | POA: Diagnosis not present

## 2018-02-21 DIAGNOSIS — Z79899 Other long term (current) drug therapy: Secondary | ICD-10-CM | POA: Insufficient documentation

## 2018-02-21 DIAGNOSIS — R509 Fever, unspecified: Secondary | ICD-10-CM | POA: Diagnosis not present

## 2018-02-21 DIAGNOSIS — G40909 Epilepsy, unspecified, not intractable, without status epilepticus: Secondary | ICD-10-CM | POA: Diagnosis not present

## 2018-02-21 DIAGNOSIS — J101 Influenza due to other identified influenza virus with other respiratory manifestations: Secondary | ICD-10-CM | POA: Insufficient documentation

## 2018-02-21 LAB — CBC WITH DIFFERENTIAL/PLATELET
Basophils Absolute: 0 10*3/uL (ref 0.0–0.1)
Basophils Relative: 0 %
Eosinophils Absolute: 0.1 10*3/uL (ref 0.0–0.7)
Eosinophils Relative: 1 %
HCT: 39.3 % (ref 36.0–46.0)
Hemoglobin: 13.8 g/dL (ref 12.0–15.0)
Lymphocytes Relative: 11 %
Lymphs Abs: 0.8 10*3/uL (ref 0.7–4.0)
MCH: 32.4 pg (ref 26.0–34.0)
MCHC: 35.1 g/dL (ref 30.0–36.0)
MCV: 92.3 fL (ref 78.0–100.0)
Monocytes Absolute: 0.9 10*3/uL (ref 0.1–1.0)
Monocytes Relative: 13 %
Neutro Abs: 5.2 10*3/uL (ref 1.7–7.7)
Neutrophils Relative %: 75 %
Platelets: 275 10*3/uL (ref 150–400)
RBC: 4.26 MIL/uL (ref 3.87–5.11)
RDW: 13.1 % (ref 11.5–15.5)
WBC: 6.9 10*3/uL (ref 4.0–10.5)

## 2018-02-21 LAB — BASIC METABOLIC PANEL
Anion gap: 12 (ref 5–15)
BUN: 8 mg/dL (ref 6–20)
CO2: 17 mmol/L — ABNORMAL LOW (ref 22–32)
Calcium: 9.4 mg/dL (ref 8.9–10.3)
Chloride: 115 mmol/L — ABNORMAL HIGH (ref 101–111)
Creatinine, Ser: 0.92 mg/dL (ref 0.44–1.00)
GFR calc Af Amer: >60 mL/min (ref >60)
GFR calc non Af Amer: >60 mL/min (ref >60)
Glucose, Bld: 95 mg/dL (ref 65–99)
Potassium: 3.7 mmol/L (ref 3.5–5.1)
Sodium: 144 mmol/L (ref 135–145)

## 2018-02-21 LAB — INFLUENZA PANEL BY PCR (TYPE A & B)
Influenza A By PCR: POSITIVE — AB
Influenza B By PCR: NEGATIVE

## 2018-02-21 LAB — RAPID STREP SCREEN (MED CTR MEBANE ONLY): Streptococcus, Group A Screen (Direct): NEGATIVE

## 2018-02-21 LAB — CBG MONITORING, ED: Glucose-Capillary: 96 mg/dL (ref 65–99)

## 2018-02-21 MED ORDER — ACETAMINOPHEN 325 MG PO TABS
650.0000 mg | ORAL_TABLET | Freq: Once | ORAL | Status: AC | PRN
  Administered 2018-02-21: 650 mg via ORAL
  Filled 2018-02-21: qty 2

## 2018-02-21 MED ORDER — OSELTAMIVIR PHOSPHATE 75 MG PO CAPS: 75.0000 mg | ORAL_CAPSULE | Freq: Two times a day (BID) | ORAL | 0 refills | Status: DC

## 2018-02-21 MED ORDER — SODIUM CHLORIDE 0.9 % IV BOLUS (SEPSIS)
1000.0000 mL | Freq: Once | INTRAVENOUS | Status: AC
  Administered 2018-02-21: 1000 mL via INTRAVENOUS

## 2018-02-21 MED ORDER — OSELTAMIVIR PHOSPHATE 75 MG PO CAPS
75.0000 mg | ORAL_CAPSULE | Freq: Once | ORAL | Status: AC
Start: 2018-02-21 — End: 2018-02-21
  Administered 2018-02-21: 75 mg via ORAL
  Filled 2018-02-21: qty 1

## 2018-02-21 MED ORDER — LORAZEPAM 1 MG PO TABS
1.0000 mg | ORAL_TABLET | Freq: Once | ORAL | Status: AC
  Administered 2018-02-21: 1 mg via ORAL
  Filled 2018-02-21: qty 1

## 2018-02-21 NOTE — ED Triage Notes (Signed)
Pt arrives from home via [facility] reporting witnessed clonic tonic seizure lasting 2 minutes, no oral trauma, no incontinence, no fall. Pt c/o HA, sinus pressure, cough, body aches starting this am. Pt AOx4 on arrival, reports hx seizure, reports recent exposure to type A influenza and strep.

## 2018-02-21 NOTE — ED Notes (Signed)
Pt discharged from ED; instructions provided and scripts given; Pt encouraged to return to ED if symptoms worsen and to f/u with PCP; Pt verbalized understanding of all instructions 

## 2018-02-21 NOTE — ED Provider Notes (Signed)
[facility] EMERGENCY DEPARTMENT Provider Note   CSN: [id number] Arrival date & time: 02/21/18  1907     History   Chief Complaint Chief Complaint  Patient presents with  . Seizures  . Fever    HPI Elizabeth Mata is a 57 y.o. female.  HPI  Patient is a 57 year old female, she has a known history of seizure disorder, she is currently taking Topamax, she has been exposed to a toddler in the house who has had both strep throat as well as influenza and over the course of the last 24 hours she has developed fevers, chills, body aches, coughing, headache and has had progressive symptoms today.  This culminated in a 2-minute seizure witnessed by family members which was tonic-clonic, similar to patient's prior seizures.  She came out of it without any difficulty and currently is back to her baseline mental status.  She is accompanied by family members who agree to this.  No medications given prior to arrival.  Past Medical History:  Diagnosis Date  . Allergic rhinitis 06/30/2016  . Carotid atherosclerosis, bilateral 11/06/2017   [place] Nov 2018  . Cyst of breast, left, solitary   . Flank pain   . GERD (gastroesophageal reflux disease)   . Hemorrhoid   . IFG (impaired fasting glucose) 07/18/2016   June 2017   . Kidney stones   . Neuropathy 07/24/2016  . Seizures ([place])   . Shingles outbreak 02/03/2017  . Syncope    Recurrent over the last 2 years. had workup at [facility] this year with cardiology and neurology. Had echo and 3-week event monitor that were unremarkable. Has had negative EEG. Echo in 11/11 at [facility] showed no significant abnormalities. Possibly vasovagal  . Trigger thumb     Patient Active Problem List   Diagnosis Date Noted  . Urticaria 01/05/2018  . Carotid atherosclerosis, bilateral 11/06/2017  . TIA (transient ischemic attack) 10/28/2017  . Hx of completed stroke 10/28/2017  . Ataxia 10/08/2017  . Breast pain in female 07/11/2017  . Medication  monitoring encounter 12/20/2016  . Family health problem 12/20/2016  . Abnormality of globulin 07/26/2016  . Neuropathy 07/24/2016  . 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 IFG (impaired fasting glucose) 07/18/2016  . Hematuria 07/03/2016  . Allergic rhinitis 06/30/2016  . Chronic left hip pain 06/28/2016  . Breast cancer screening 04/17/2016  . Skin texture changes 12/27/2015  . Vaginal discharge 10/31/2015  . Screening for STD (sexually transmitted disease) 10/31/2015  . Kidney stone on left side 10/24/2015  . Annual physical exam 09/13/2015  . Encounter for screening mammogram for malignant neoplasm of breast 09/13/2015  . Need for immunization against influenza 09/13/2015  . Eczema 09/13/2015  . Irritable bowel syndrome with constipation 09/13/2015  . Seizures ([place]) 08/16/2015  . Carpal tunnel syndrome 07/26/2014  . Carotid artery narrowing 07/05/2013  . 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 ABDOMINAL HYSTERECTOMY  40s   endometriosis, fibroid tumors; no cancer, partial  . BREAST BIOPSY Left 1980'S   EXCISIONAL - NEG  . CHOLECYSTECTOMY  31  . FOOT SURGERY Right    tendon release    OB History    Gravida Para Term Preterm AB Living   2 2       2    SAB TAB Ectopic Multiple Live Births                  Obstetric Comments   Menstrual  age: 57  Age 1st Pregnancy: 35        Home Medications    Prior to Admission medications   Medication Sig Start Date End Date Taking? Authorizing Provider  aspirin EC 81 MG EC tablet Take 1 tablet (81 mg total) by mouth daily. 10/10/17   Henreitta Leber, MD  atorvastatin (LIPITOR) 40 MG tablet Take 1 tablet (40 mg total) by mouth daily at 6 PM. 12/02/17   Lada, Satira Anis, MD  ciclopirox (LOPROX) 0.77 % cream APPLY TOPICALLY 2 (TWO) TIMES DAILY. TO AFFECTED SKIN FOR UP TO 4 WEEKS 08/22/17   [provider]  fexofenadine (ALLEGRA) 180 MG tablet Take 1 tablet (180 mg  total) by mouth daily as needed for allergies or rhinitis. 07/11/17   Arnetha Courser, MD  fluticasone (FLONASE) 50 MCG/ACT nasal spray Place 2 sprays into both nostrils daily. Patient not taking: Reported on 01/23/2018 05/16/17   Arnetha Courser, MD  hydrOXYzine (ATARAX/VISTARIL) 25 MG tablet Take 1 tablet (25 mg total) by mouth every 8 (eight) hours as needed. Patient not taking: Reported on 01/23/2018 01/05/18   Hubbard Hartshorn, FNP  oseltamivir (TAMIFLU) 75 MG capsule Take 1 capsule (75 mg total) by mouth every 12 (twelve) hours. 02/21/18   Noemi Chapel, MD  ranitidine (ZANTAC) 150 MG tablet Take 1 tablet (150 mg total) by mouth 2 (two) times daily for 15 days. Patient not taking: Reported on 01/23/2018 01/05/18 01/20/18  Hubbard Hartshorn, FNP  topiramate (TOPAMAX) 200 MG tablet TAKE 1 TABLET (200 MG TOTAL) BY MOUTH 2 (TWO) TIMES DAILY. 01/23/17   [provider]  triamcinolone ointment (KENALOG) 0.1 % APPLY ON THE SKIN TWICE A DAY AS NEEDED AS NEEDED RASH NOT TO FACE 10/15/17   [provider]    Family History Family History  Problem Relation Age of Onset  . Coronary artery disease Maternal Uncle   . Diabetes Mellitus II Mother   . Arthritis Mother   . Diabetes Maternal Uncle   . Kidney cancer Father 74       died 05-15-17  . Kidney disease Father   . Cancer Father        kidney and bladder  . Arthritis Maternal Uncle   . Cancer Maternal Grandmother        stomach?  . Jaundice Maternal Grandmother   . Stomach cancer Paternal Grandmother   . Cancer Paternal Grandmother        stomach  . Hypertension Sister   . Stroke Maternal Grandfather   . Diabetes Paternal Grandfather   . Diabetes Sister   . Hypertension Sister   . Breast cancer Neg Hx   . Bladder Cancer Neg Hx     Social History Social History   Tobacco Use  . Smoking status: Never Smoker  . Smokeless tobacco: Never Used  Substance Use Topics  . Alcohol use: Yes    Alcohol/week: 0.0 oz    Comment:  occ  . Drug use: No     Allergies   Clindamycin hcl; Clindamycin/lincomycin; Hydrocodone; Metronidazole; Sulfa antibiotics; and Oxycodone   Review of Systems Review of Systems  All other systems reviewed and are negative.    Physical Exam Updated Vital Signs BP 114/77   Pulse 75   Temp 99.1 F (37.3 C) (Oral)   Resp 15   Ht 4\' 11"  (1.499 m)   Wt 59 kg (130 lb)   SpO2 98%   BMI 26.26 kg/m   Physical  Exam  Constitutional: She appears well-developed and well-nourished. No distress.  HENT:  Head: Normocephalic and atraumatic.  Mouth/Throat: Oropharynx is clear and moist. No oropharyngeal exudate.  Oropharynx is clear and moist, no exudate asymmetry or hypertrophy, uvula is midline  Eyes: Conjunctivae and EOM are normal. Pupils are equal, round, and reactive to light. Right eye exhibits no discharge. Left eye exhibits no discharge. No scleral icterus.  Neck: Normal range of motion. Neck supple. No JVD present. No thyromegaly present.  Supple neck No LAD  Cardiovascular: Normal rate, regular rhythm, normal heart sounds and intact distal pulses. Exam reveals no gallop and no friction rub.  No murmur heard. Pulmonary/Chest: Effort normal and breath sounds normal. No respiratory distress. She has no wheezes. She has no rales.  Abdominal: Soft. Bowel sounds are normal. She exhibits no distension and no mass. There is no tenderness.  Musculoskeletal: Normal range of motion. She exhibits no edema or tenderness.  Lymphadenopathy:    She has no cervical adenopathy.  Neurological: She is alert. Coordination normal.  Speech is clear, cranial nerves III through XII are intact, memory is intact, strength is normal in all 4 extremities including grips, sensation is intact to light touch and pinprick in all 4 extremities. Coordination as tested by finger-nose-finger is normal, no limb ataxia. Normal gait, normal reflexes at the patellar tendons bilaterally  Skin: Skin is warm and dry. No  rash noted. No erythema.  Psychiatric: She has a normal mood and affect. Her behavior is normal.  Nursing note and vitals reviewed.    ED Treatments / Results  Labs (all labs ordered are listed, but only abnormal results are displayed) Labs Reviewed  INFLUENZA PANEL BY PCR (TYPE A & B) - Abnormal; Notable for the following components:      Result Value   Influenza A By PCR POSITIVE (*)    All other components within normal limits  RAPID STREP SCREEN (NOT AT [facility])  CULTURE, GROUP A STREP ([place])  CBC WITH DIFFERENTIAL/PLATELET  BASIC METABOLIC PANEL  CBG MONITORING, ED  I-STAT BETA HCG BLOOD, ED ([facility], [facility], [facility] ONLY)     Radiology No results found.  Procedures Procedures (including critical care time)  Medications Ordered in ED Medications  oseltamivir (TAMIFLU) capsule 75 mg (not administered)  acetaminophen (TYLENOL) tablet 650 mg (650 mg Oral Given 02/21/18 1938)  LORazepam (ATIVAN) tablet 1 mg (1 mg Oral Given 02/21/18 2016)  sodium chloride 0.9 % bolus 1,000 mL (0 mLs Intravenous Stopped 02/21/18 2137)     Initial Impression / Assessment and Plan / ED Course  I have reviewed the triage vital signs and the nursing notes.  Pertinent labs & imaging results that were available during my care of the patient were reviewed by me and considered in my medical decision making (see chart for details).    Lungs are clear, throat is clear, given the patient's symptoms it is consistent with a flulike illness.  She is back to her normal status, there is no focal neurologic deficits, she has a very supple neck without any meningismus.  Suspect flulike illness, likely viral, Ativan given to prevent further seizures, antipyretics, investigate for flu and strep.  Influenza A positive, strep negative, CBC unremarkable.  The patient is now afebrile, she is stable for discharge, I explained at length to the family the indications for return including ongoing seizures, worsening symptoms,  changes in mental status or difficulty breathing.  Vitals:   02/21/18 2045 02/21/18 2100 02/21/18 2130 02/21/18 2136  BP: 117/73 104/81 114/77   Pulse: 83 82 75   Resp: 13 15 15    Temp:    99.1 F (37.3 C)  TempSrc:    Oral  SpO2: 97% 98% 98%   Weight:      Height:         Final Clinical Impressions(s) / ED Diagnoses   Final diagnoses:  Influenza A  Seizure [facility])    ED Discharge Orders        Ordered    oseltamivir (TAMIFLU) 75 MG capsule  Every 12 hours     02/21/18 2136       Noemi Chapel, MD 02/21/18 2139

## 2018-02-21 NOTE — ED Notes (Signed)
Swallow screen performed but pt has no appetite; took two small bites of graham cracker and stated no difficulty; Christyln, RN present

## 2018-02-21 NOTE — Discharge Instructions (Signed)
Please take Tamiflu, 75 mg by mouth twice a day for the next 5 days to treat the flu Please read the attached instructions regarding the flu Please take your daily regular seizure medication Please treat fevers with Tylenol or ibuprofen as follows, Tylenol 1000 mg alternating with ibuprofen 800 mg every 4 hours as needed for fever over 101   Emergency department for severe or worsening symptoms including ongoing seizures lasting or multiple seizures in a day.

## 2018-02-24 LAB — CULTURE, GROUP A STREP (THRC)

## 2018-03-01 NOTE — Progress Notes (Deleted)
Cardiology Office Note  Date:  03/01/2018   ID:  Elizabeth Mata, [place] November 26, 1961, MRN [id number]  PCP:  Arnetha Courser, MD   No chief complaint on file.   HPI:  Ms Elizabeth Mata is a 57 yo woman with PMH of  headaches Seizure hx Carotid dz Syncope Work up at [facility] echo and 3-week event monitor that were unremarkable.  negative EEG.  Echo in 11/11 at [facility] showed no significant abnormalities. Possibly vasovagal  Referred by Dr. Sanda Klein for possible stroke, need for 30 day monitor  In the hospital 01/23/2018 with dizziness, blurred vision On asa Seen by neurology  Echo 10/2017 Left ventricle: The cavity size was normal. Systolic function was   normal. The estimated ejection fraction was in the range of 60%   to 65%. Wall motion was normal; there were no regional wall   motion abnormalities. The study is not technically sufficient to   allow evaluation of LV diastolic function. - Left atrium: The atrium was normal in size. - Right ventricle: Systolic function was normal. - Pulmonary arteries: Systolic pressure was within the normal   range.   Carotid u/s <39% b/l  11/06/2017  Hospital evaluations Seizure 02/21/2018, seen in the ER Headache, blurry vision on and off seen in the ER 01/23/2018 Transient ataxia, gait abnormalities ,admitted to the hospital 09/2017  extensive neurologic workup including CT head, MRI of the brain, carotid duplex. chronic lacunar infarcts noted Seizure 05/2013, seen in the ER     PMH:   has a past medical history of Allergic rhinitis (06/30/2016), Carotid atherosclerosis, bilateral (11/06/2017), Cyst of breast, left, solitary, Flank pain, GERD (gastroesophageal reflux disease), Hemorrhoid, IFG (impaired fasting glucose) (07/18/2016), Kidney stones, Neuropathy (07/24/2016), Seizures ([place]), Shingles outbreak (02/03/2017), Syncope, and Trigger thumb.  PSH:    Past Surgical History:  Procedure Laterality Date  . ABDOMINAL HYSTERECTOMY  40s   endometriosis, fibroid tumors; no cancer, partial  . BREAST BIOPSY Left 1980'S   EXCISIONAL - NEG  . CHOLECYSTECTOMY  31  . FOOT SURGERY Right    tendon release    Current Outpatient Medications  Medication Sig Dispense Refill  . aspirin EC 81 MG EC tablet Take 1 tablet (81 mg total) by mouth daily. 30 tablet 1  . atorvastatin (LIPITOR) 40 MG tablet Take 1 tablet (40 mg total) by mouth daily at 6 PM. 30 tablet 5  . ciclopirox (LOPROX) 0.77 % cream APPLY TOPICALLY 2 (TWO) TIMES DAILY. TO AFFECTED SKIN FOR UP TO 4 WEEKS  0  . fexofenadine (ALLEGRA) 180 MG tablet Take 1 tablet (180 mg total) by mouth daily as needed for allergies or rhinitis. 90 tablet 3  . fluticasone (FLONASE) 50 MCG/ACT nasal spray Place 2 sprays into both nostrils daily. (Patient not taking: Reported on 01/23/2018) 16 g 6  . hydrOXYzine (ATARAX/VISTARIL) 25 MG tablet Take 1 tablet (25 mg total) by mouth every 8 (eight) hours as needed. (Patient not taking: Reported on 01/23/2018) 30 tablet 0  . oseltamivir (TAMIFLU) 75 MG capsule Take 1 capsule (75 mg total) by mouth every 12 (twelve) hours. 10 capsule 0  . ranitidine (ZANTAC) 150 MG tablet Take 1 tablet (150 mg total) by mouth 2 (two) times daily for 15 days. (Patient not taking: Reported on 01/23/2018) 30 tablet 0  . topiramate (TOPAMAX) 200 MG tablet TAKE 1 TABLET (200 MG TOTAL) BY MOUTH 2 (TWO) TIMES DAILY.  11  . triamcinolone ointment (KENALOG) 0.1 % APPLY ON THE SKIN TWICE A DAY AS  NEEDED AS NEEDED RASH NOT TO FACE  1   No current facility-administered medications for this visit.      Allergies:   Clindamycin hcl; Clindamycin/lincomycin; Hydrocodone; Metronidazole; Sulfa antibiotics; and Oxycodone   Social History:  The patient  reports that  has never smoked. she has never used smokeless tobacco. She reports that she drinks alcohol. She reports that she does not use drugs.   Family History:   family history includes Arthritis in her maternal uncle and mother;  Cancer in her father, maternal grandmother, and paternal grandmother; Coronary artery disease in her maternal uncle; Diabetes in her maternal uncle, paternal grandfather, and sister; Diabetes Mellitus II in her mother; Hypertension in her sister and sister; Jaundice in her maternal grandmother; Kidney cancer (age of onset: 62) in her father; Kidney disease in her father; Stomach cancer in her paternal grandmother; Stroke in her maternal grandfather.    Review of Systems: ROS   PHYSICAL EXAM: VS:  There were no vitals taken for this visit. , BMI There is no height or weight on file to calculate BMI. GEN: Well nourished, well developed, in no acute distress  HEENT: normal  Neck: no JVD, carotid bruits, or masses Cardiac: RRR; no murmurs, rubs, or gallops,no edema  Respiratory:  clear to auscultation bilaterally, normal work of breathing GI: soft, nontender, nondistended, + BS MS: no deformity or atrophy  Skin: warm and dry, no rash Neuro:  Strength and sensation are intact Psych: euthymic mood, full affect    Recent Labs: 01/23/2018: ALT 38 02/21/2018: BUN 8; Creatinine, Ser 0.92; Hemoglobin 13.8; Platelets 275; Potassium 3.7; Sodium 144    Lipid Panel Lab Results  Component Value Date   CHOL 263 (H) 12/20/2016   HDL 116 12/20/2016   LDLCALC 135 (H) 12/20/2016   TRIG 60 12/20/2016      Wt Readings from Last 3 Encounters:  02/21/18 130 lb (59 kg)  01/05/18 127 lb (57.6 kg)  12/02/17 127 lb 8 oz (57.8 kg)       ASSESSMENT AND PLAN:  No diagnosis found.   Disposition:   F/U  6 months  No orders of the defined types were placed in this encounter.    Signed, Esmond Plants, M.D., Ph.D. 03/01/2018  [place], [place]

## 2018-03-03 ENCOUNTER — Ambulatory Visit: Payer: Medicare Other | Admitting: Cardiovascular Disease

## 2018-04-21 ENCOUNTER — Ambulatory Visit (INDEPENDENT_AMBULATORY_CARE_PROVIDER_SITE_OTHER): Payer: Medicare Other | Admitting: Family Medicine

## 2018-04-21 ENCOUNTER — Encounter: Payer: Self-pay | Admitting: Family Medicine

## 2018-04-21 ENCOUNTER — Other Ambulatory Visit: Payer: Self-pay

## 2018-04-21 VITALS — BP 122/78 | HR 70 | Temp 98.1°F | Resp 14 | Ht 59.0 in | Wt 128.1 lb

## 2018-04-21 DIAGNOSIS — I6523 Occlusion and stenosis of bilateral carotid arteries: Secondary | ICD-10-CM

## 2018-04-21 DIAGNOSIS — R3915 Urgency of urination: Secondary | ICD-10-CM

## 2018-04-21 DIAGNOSIS — N898 Other specified noninflammatory disorders of vagina: Secondary | ICD-10-CM | POA: Diagnosis not present

## 2018-04-21 DIAGNOSIS — Z113 Encounter for screening for infections with a predominantly sexual mode of transmission: Secondary | ICD-10-CM

## 2018-04-21 DIAGNOSIS — R3129 Other microscopic hematuria: Secondary | ICD-10-CM | POA: Diagnosis not present

## 2018-04-21 DIAGNOSIS — N951 Menopausal and female climacteric states: Secondary | ICD-10-CM | POA: Insufficient documentation

## 2018-04-21 DIAGNOSIS — R8281 Pyuria: Secondary | ICD-10-CM | POA: Insufficient documentation

## 2018-04-21 DIAGNOSIS — Z5181 Encounter for therapeutic drug level monitoring: Secondary | ICD-10-CM | POA: Diagnosis not present

## 2018-04-21 DIAGNOSIS — R7301 Impaired fasting glucose: Secondary | ICD-10-CM

## 2018-04-21 LAB — LIPID PANEL
Cholesterol: 199 mg/dL (ref <200)
HDL: 94 mg/dL (ref >50)
LDL Cholesterol (Calc): 92 mg/dL (calc)
Non-HDL Cholesterol (Calc): 105 mg/dL (calc) (ref <130)
Total CHOL/HDL Ratio: 2.1 (calc) (ref <5.0)
Triglycerides: 50 mg/dL (ref <150)

## 2018-04-21 LAB — POCT URINALYSIS DIPSTICK
Bilirubin, UA: NEGATIVE
Blood, UA: POSITIVE
Glucose, UA: NEGATIVE
Ketones, UA: NEGATIVE
Nitrite, UA: NEGATIVE
Odor: NORMAL
Protein, UA: NEGATIVE
Spec Grav, UA: >=1.03 — AB (ref 1.010–1.025)
Urobilinogen, UA: 0.2 E.U./dL
pH, UA: 5 (ref 5.0–8.0)

## 2018-04-21 LAB — COMPLETE METABOLIC PANEL WITH GFR
AG Ratio: 1.8 (calc) (ref 1.0–2.5)
ALT: 19 U/L (ref 6–29)
AST: 22 U/L (ref 10–35)
Albumin: 4.4 g/dL (ref 3.6–5.1)
Alkaline phosphatase (APISO): 79 U/L (ref 33–130)
BUN: 14 mg/dL (ref 7–25)
CO2: 23 mmol/L (ref 20–32)
Calcium: 9.7 mg/dL (ref 8.6–10.4)
Chloride: 113 mmol/L — ABNORMAL HIGH (ref 98–110)
Creat: 0.87 mg/dL (ref 0.50–1.05)
GFR, Est African American: 87 mL/min/{1.73_m2} (ref >=60)
GFR, Est Non African American: 75 mL/min/{1.73_m2} (ref >=60)
Globulin: 2.4 g/dL (calc) (ref 1.9–3.7)
Glucose, Bld: 103 mg/dL (ref 65–139)
Potassium: 4 mmol/L (ref 3.5–5.3)
Sodium: 141 mmol/L (ref 135–146)
Total Bilirubin: 0.5 mg/dL (ref 0.2–1.2)
Total Protein: 6.8 g/dL (ref 6.1–8.1)

## 2018-04-21 MED ORDER — NITROFURANTOIN MONOHYD MACRO 100 MG PO CAPS: 100.0000 mg | ORAL_CAPSULE | Freq: Two times a day (BID) | ORAL | 0 refills | Status: DC

## 2018-04-21 NOTE — Assessment & Plan Note (Signed)
Checking urine micro

## 2018-04-21 NOTE — Patient Instructions (Signed)
Start the antibiotic today We'll get the other tests back in the next day or two and contact you Try to drink more water and stay well-hydrated Call with any problems

## 2018-04-21 NOTE — Progress Notes (Signed)
BP 122/78   Pulse 70   Temp 98.1 F (36.7 C) (Oral)   Resp 14   Ht 4\' 11"  (1.499 m)   Wt 128 lb 1.6 oz (58.1 kg)   SpO2 99%   BMI 25.87 kg/m    Subjective:    Patient ID: Elizabeth Mata, female    DOB: [birth year], 57 y.o.   MRN: [id number]  HPI: Elizabeth Mata is a 57 y.o. female  Chief Complaint  Patient presents with  . Urinary Tract Infection    urge, pressure on and off  . Vaginitis    Possible BV pt states has smelled some odor    HPI Patient is here for an acute visit She was in the ER last week with her sick granddaughter, so she couldn't leave her to void for hours Now having symptoms; pressure and urgency Maybe an odor; sort of having back pain; no blood in the urine No fevers; no vomiting  She has high cholesterol; needing a refill of her cholesterol medicine, but due for labs Prediabetes; denies dry mouth, blurred vision  Depression screen [facility] 2/9 04/21/2018 12/02/2017 10/27/2017 09/26/2017 07/11/2017  Decreased Interest 0 0 0 0 0  Down, Depressed, Hopeless 0 0 0 0 0  PHQ - 2 Score 0 0 0 0 0    Relevant past medical, surgical, family and social history reviewed Past Medical History:  Diagnosis Date  . Allergic rhinitis 06/30/2016  . Carotid atherosclerosis, bilateral 11/06/2017   [place] Nov 2018  . Cyst of breast, left, solitary   . Flank pain   . GERD (gastroesophageal reflux disease)   . Hemorrhoid   . IFG (impaired fasting glucose) 07/18/2016   June 2017   . Kidney stones   . Neuropathy 07/24/2016  . Seizures ([place])   . Shingles outbreak 02/03/2017  . Syncope    Recurrent over the last 2 years. had workup at [facility] this year with cardiology and neurology. Had echo and 3-week event monitor that were unremarkable. Has had negative EEG. Echo in 11/11 at [facility] showed no significant abnormalities. Possibly vasovagal  . Trigger thumb    Past Surgical History:  Procedure Laterality Date  . ABDOMINAL HYSTERECTOMY  40s   endometriosis, fibroid  tumors; no cancer, partial  . BREAST BIOPSY Left 1980'S   EXCISIONAL - NEG  . CHOLECYSTECTOMY  31  . FOOT SURGERY Right    tendon release   Family History  Problem Relation Age of Onset  . Coronary artery disease Maternal Uncle   . Diabetes Mellitus II Mother   . Arthritis Mother   . Diabetes Maternal Uncle   . Kidney cancer Father 70       died 05/01/2017  . Kidney disease Father   . Cancer Father        kidney and bladder  . Arthritis Maternal Uncle   . Cancer Maternal Grandmother        stomach?  . Jaundice Maternal Grandmother   . Stomach cancer Paternal Grandmother   . Cancer Paternal Grandmother        stomach  . Hypertension Sister   . Stroke Maternal Grandfather   . Diabetes Paternal Grandfather   . Diabetes Sister   . Hypertension Sister   . Breast cancer Neg Hx   . Bladder Cancer Neg Hx    Social History   Tobacco Use  . Smoking status: Never Smoker  . Smokeless tobacco: Never Used  Substance Use Topics  . Alcohol  use: Yes    Alcohol/week: 0.0 oz    Comment: occ  . Drug use: No    Interim medical history since last visit reviewed. Allergies and medications reviewed  Review of Systems Per HPI unless specifically indicated above     Objective:    BP 122/78   Pulse 70   Temp 98.1 F (36.7 C) (Oral)   Resp 14   Ht 4\' 11"  (1.499 m)   Wt 128 lb 1.6 oz (58.1 kg)   SpO2 99%   BMI 25.87 kg/m   Wt Readings from Last 3 Encounters:  04/21/18 128 lb 1.6 oz (58.1 kg)  02/21/18 130 lb (59 kg)  01/05/18 127 lb (57.6 kg)    Physical Exam  Constitutional: She appears well-developed and well-nourished.  HENT:  Mouth/Throat: Mucous membranes are normal.  Eyes: EOM are normal. No scleral icterus.  Cardiovascular: Normal rate and regular rhythm.  Pulmonary/Chest: Effort normal and breath sounds normal.  Abdominal: There is tenderness in the suprapubic area. There is no guarding.  Skin: She is not diaphoretic. No pallor.  Psychiatric: She has a  normal mood and affect. Her mood appears not anxious. She does not exhibit a depressed mood.    Results for orders placed or performed in visit on 04/21/18  POCT urinalysis dipstick  Result Value Ref Range   Color, UA YELLOW    Clarity, UA CLEAR    Glucose, UA NEG    Bilirubin, UA NEG    Ketones, UA NEG    Spec Grav, UA >=1.030 (A) 1.010 - 1.025   Blood, UA POSITIVE    pH, UA 5.0 5.0 - 8.0   Protein, UA NEG    Urobilinogen, UA 0.2 0.2 or 1.0 E.U./dL   Nitrite, UA NEG    Leukocytes, UA Trace (A) Negative   Appearance CLEAR    Odor NORMAL       Assessment & Plan:   Problem List Items Addressed This Visit      Cardiovascular and Mediastinum   Carotid artery narrowing   Relevant Orders   Lipid panel     Endocrine   IFG (impaired fasting glucose)   Relevant Orders   Hemoglobin A1c     Other   Medication monitoring encounter   Relevant Orders   COMPLETE METABOLIC PANEL WITH GFR   Hematuria    Checking urine micro      Relevant Orders   Urinalysis, microscopic only    Other Visit Diagnoses    Urgency of urination    -  Primary   urine dip done in house, suggestive of possible bladder infection; start macrobid; culture pending; hydrate   Relevant Orders   POCT urinalysis dipstick (Completed)   Urine Culture   Screen for STD (sexually transmitted disease)       GC and chl ordered by CMA   Relevant Orders   C. trachomatis/N. gonorrhoeae RNA   Vaginal odor       wet mount collected; treat based on findings   Relevant Orders   WET PREP BY MOLECULAR PROBE       Follow up plan: Return if symptoms worsen or fail to improve.  An after-visit summary was printed and given to the patient at [place].  Please see the patient instructions which may contain other information and recommendations beyond what is mentioned above in the assessment and plan.  Meds ordered this encounter  Medications  . nitrofurantoin, macrocrystal-monohydrate, (MACROBID) 100 MG capsule     Sig: Take  1 capsule (100 mg total) by mouth 2 (two) times daily.    Dispense:  10 capsule    Refill:  0    Orders Placed This Encounter  Procedures  . C. trachomatis/N. gonorrhoeae RNA  . Urine Culture  . WET PREP BY MOLECULAR PROBE  . Urinalysis, microscopic only  . Hemoglobin A1c  . Lipid panel  . COMPLETE METABOLIC PANEL WITH GFR  . POCT urinalysis dipstick

## 2018-04-22 ENCOUNTER — Other Ambulatory Visit: Payer: Self-pay | Admitting: Family Medicine

## 2018-04-22 LAB — WET PREP BY MOLECULAR PROBE
Candida species: NOT DETECTED
MICRO NUMBER:: 90498420
SOURCE:: 0
SPECIMEN QUALITY:: ADEQUATE
Trichomonas vaginosis: NOT DETECTED

## 2018-04-22 LAB — URINALYSIS, MICROSCOPIC ONLY
Bacteria, UA: NONE SEEN /HPF
Hyaline Cast: NONE SEEN /LPF
RBC / HPF: NONE SEEN /HPF (ref 0–2)

## 2018-04-22 LAB — URINE CULTURE
MICRO NUMBER:: 90496837
Result:: NO GROWTH
SPECIMEN QUALITY:: ADEQUATE

## 2018-04-22 LAB — HEMOGLOBIN A1C
Hgb A1c MFr Bld: 5.7 % of total Hgb — ABNORMAL HIGH (ref <5.7)
Mean Plasma Glucose: 117 (calc)
eAG (mmol/L): 6.5 (calc)

## 2018-04-22 LAB — C. TRACHOMATIS/N. GONORRHOEAE RNA
C. trachomatis RNA, TMA: NOT DETECTED
N. gonorrhoeae RNA, TMA: NOT DETECTED

## 2018-04-22 MED ORDER — METRONIDAZOLE 0.75 % VA GEL: 1.0000 | Freq: Two times a day (BID) | VAGINAL | 0 refills | Status: DC

## 2018-04-22 NOTE — Progress Notes (Signed)
Okay for rx gel

## 2018-04-28 ENCOUNTER — Ambulatory Visit: Payer: Medicare Other | Admitting: Allergy and Immunology

## 2018-05-01 DIAGNOSIS — L509 Urticaria, unspecified: Secondary | ICD-10-CM | POA: Diagnosis not present

## 2018-05-19 ENCOUNTER — Other Ambulatory Visit: Payer: Self-pay | Admitting: Family Medicine

## 2018-05-19 NOTE — Telephone Encounter (Signed)
Last sgpt and lipids reviewed; rx approved 

## 2018-06-03 ENCOUNTER — Telehealth: Payer: Self-pay

## 2018-06-03 NOTE — Telephone Encounter (Signed)
Copied from [place] [id number]. Topic: Inquiry >> Jun 03, 2018 10:38 AM Scherrie Gerlach wrote: Reason for CRM: pt was seen in April, and had all her labs done at that time.  Pt had previously made an appt for June, tomorrow, but realized she does not need to come in again so soon. Pt has rescheduled for Sept, just wants to make sure Dr Sanda Klein knows and that is ok. Pt scheduled 5 months from the April appt

## 2018-06-04 ENCOUNTER — Ambulatory Visit: Payer: Medicare Other | Admitting: Family Medicine

## 2018-06-04 ENCOUNTER — Other Ambulatory Visit: Payer: Self-pay | Admitting: Family Medicine

## 2018-06-04 NOTE — Telephone Encounter (Signed)
That's fine

## 2018-06-04 NOTE — Telephone Encounter (Signed)
Copied from [place] [id number]. Topic: Quick Communication - See Telephone Encounter >> Jun 04, 2018  9:43 AM Percell Belt A wrote: CRM for notification. See Telephone encounter for: 06/04/18.  Pt called in and state that pharmacy called her and told her that the fluticasone (FLONASE) 50 MCG/ACT nasal spray [[id number]] should be a 3 month supply for ins purposes?  Can this be called in for a 3 month supply?  Pharmacy [facility] number for pt  [phone]

## 2018-06-05 ENCOUNTER — Other Ambulatory Visit: Payer: Self-pay

## 2018-06-05 MED ORDER — FLUTICASONE PROPIONATE 50 MCG/ACT NA SUSP: 2.0000 | Freq: Every day | NASAL | 3 refills | Status: DC

## 2018-06-05 NOTE — Telephone Encounter (Signed)
Needs 3 month supply

## 2018-06-05 NOTE — Telephone Encounter (Signed)
Copied from [place] [id number]. Topic: Quick Communication - See Telephone Encounter >> Jun 04, 2018  9:43 AM Percell Belt A wrote: CRM for notification. See Telephone encounter for: 06/04/18.  Pt called in and state that pharmacy called her and told her that the fluticasone (FLONASE) 50 MCG/ACT nasal spray [[id number]] should be a 3 month supply for ins purposes?  Can this be called in for a 3 month supply?  Ph[facility] [place] number for pt  [phone]  >> Jun 05, 2018  9:10 AM Yvette Rack wrote: Pt states that the pharmacy told her that the fluticasone [facility]) 50 MCG/ACT nasal spraye was not at the pharmacy pt states that someone told her that it was called into pharmacy [facility] [place]

## 2018-06-05 NOTE — Telephone Encounter (Signed)
Yes, done.  

## 2018-08-21 ENCOUNTER — Other Ambulatory Visit: Payer: Self-pay | Admitting: Family Medicine

## 2018-09-02 ENCOUNTER — Other Ambulatory Visit: Payer: Self-pay | Admitting: Family Medicine

## 2018-09-02 DIAGNOSIS — Z1231 Encounter for screening mammogram for malignant neoplasm of breast: Secondary | ICD-10-CM

## 2018-09-15 ENCOUNTER — Ambulatory Visit (INDEPENDENT_AMBULATORY_CARE_PROVIDER_SITE_OTHER): Payer: Medicare Other | Admitting: Family Medicine

## 2018-09-15 ENCOUNTER — Encounter: Payer: Self-pay | Admitting: Family Medicine

## 2018-09-15 VITALS — BP 128/70 | HR 65 | Temp 98.3°F | Ht 59.0 in | Wt 132.5 lb

## 2018-09-15 DIAGNOSIS — G43109 Migraine with aura, not intractable, without status migrainosus: Secondary | ICD-10-CM

## 2018-09-15 DIAGNOSIS — E7800 Pure hypercholesterolemia, unspecified: Secondary | ICD-10-CM

## 2018-09-15 DIAGNOSIS — R569 Unspecified convulsions: Secondary | ICD-10-CM

## 2018-09-15 DIAGNOSIS — Z23 Encounter for immunization: Secondary | ICD-10-CM | POA: Diagnosis not present

## 2018-09-15 DIAGNOSIS — J301 Allergic rhinitis due to pollen: Secondary | ICD-10-CM | POA: Diagnosis not present

## 2018-09-15 DIAGNOSIS — R1013 Epigastric pain: Secondary | ICD-10-CM

## 2018-09-15 DIAGNOSIS — R7301 Impaired fasting glucose: Secondary | ICD-10-CM

## 2018-09-15 DIAGNOSIS — K219 Gastro-esophageal reflux disease without esophagitis: Secondary | ICD-10-CM | POA: Diagnosis not present

## 2018-09-15 DIAGNOSIS — I6523 Occlusion and stenosis of bilateral carotid arteries: Secondary | ICD-10-CM

## 2018-09-15 DIAGNOSIS — G459 Transient cerebral ischemic attack, unspecified: Secondary | ICD-10-CM | POA: Diagnosis not present

## 2018-09-15 MED ORDER — FAMOTIDINE 20 MG PO TABS: 20.0000 mg | ORAL_TABLET | Freq: Two times a day (BID) | ORAL | 0 refills | Status: DC

## 2018-09-15 NOTE — Assessment & Plan Note (Signed)
Chronic condition; controlled/stable; continue meds prn

## 2018-09-15 NOTE — Patient Instructions (Addendum)
-   Can continue to take tums as needed for reflux symptoms, if it is happening frequently can take a daily medication like famotidine 20mg  twice a day Food Choices for Gastroesophageal Reflux Disease, Adult When you have gastroesophageal reflux disease (GERD), the foods you eat and your eating habits are very important. Choosing the right foods can help ease your discomfort. What guidelines do I need to follow?  Choose fruits, vegetables, whole grains, and low-fat dairy products.  Choose low-fat meat, fish, and poultry.  Limit fats such as oils, salad dressings, butter, nuts, and avocado.  Keep a food diary. This helps you identify foods that cause symptoms.  Avoid foods that cause symptoms. These may be different for everyone.  Eat small meals often instead of 3 large meals a day.  Eat your meals slowly, in a place where you are relaxed.  Limit fried foods.  Cook foods using methods other than frying.  Avoid drinking alcohol.  Avoid drinking large amounts of liquids with your meals.  Avoid bending over or lying down until 2-3 hours after eating. What foods are not recommended? These are some foods and drinks that may make your symptoms worse: Vegetables Tomatoes. Tomato juice. Tomato and spaghetti sauce. Chili peppers. Onion and garlic. Horseradish. Fruits Oranges, grapefruit, and lemon (fruit and juice). Meats High-fat meats, fish, and poultry. This includes hot dogs, ribs, ham, sausage, salami, and bacon. Dairy Whole milk and chocolate milk. Sour cream. Cream. Butter. Ice cream. Cream cheese. Drinks Coffee and tea. Bubbly (carbonated) drinks or energy drinks. Condiments Hot sauce. Barbecue sauce. Sweets/Desserts Chocolate and cocoa. Donuts. Peppermint and spearmint. Fats and Oils High-fat foods. This includes [place] fries and potato chips. Other Vinegar. Strong spices. This includes black pepper, white pepper, red pepper, cayenne, curry powder, cloves, ginger, and  chili powder.

## 2018-09-15 NOTE — Assessment & Plan Note (Signed)
Check glucose (fasting) and A1c 

## 2018-09-15 NOTE — Progress Notes (Signed)
BP 128/70   Pulse 65   Temp 98.3 F (36.8 C)   Ht 4\' 11"  (1.499 m)   Wt 132 lb 8 oz (60.1 kg)   SpO2 98%   BMI 26.76 kg/m    Subjective:    Patient ID: Elizabeth Mata, female    DOB: 1961/10/20, 57 y.o.   MRN: [id number]  HPI: Elizabeth Mata is a 57 y.o. female  Chief Complaint  Patient presents with  . Follow-up    HPI  Here for f/u  TIA and carotid atherolsclerosis: Neurologist- Dr. Ernst Bowler- sees them twice a year; takes daily ASA.  Symptoms when she had it was right sided weakness; never had those symptoms again  Seizures: takes topamax daily; states last years was so long ago she doesn't remember.   Hyperlipidemia rx atorvastatin 40mg  nightly,  No side effects; eats eggs about 2x a week (sometimes 2 eggs at a time); does like cheese but doesn't eat a lot, just a little Lab Results  Component Value Date   CHOL 199 04/21/2018   HDL 94 04/21/2018   LDLCALC 92 04/21/2018   TRIG 50 04/21/2018   CHOLHDL 2.1 04/21/2018   Migraines: takes topamax 200mg  daily; states is well-controlled on this medication hasn't had one in a while.   Allergic rhinitis: allegra daily and flonase prn; well controlled right now.   Acid-reflux: states has noted some heart burn intermittent over the past few months, states resolved with tums. States noticed it at night once and then after drinking coffee in the morning. Significant discomfort when laying down once, just lasted a little while, thinks just acid reflux because it was burning pain;   Prediabetes; last A1c reviewed Lab Results  Component Value Date   HGBA1C 5.7 (H) 04/21/2018     Depression screen PHQ 2/9 09/15/2018 04/21/2018 12/02/2017 10/27/2017 09/26/2017  Decreased Interest 0 0 0 0 0  Down, Depressed, Hopeless 0 0 0 0 0  PHQ - 2 Score 0 0 0 0 0    Relevant past medical, surgical, family and social history reviewed Past Medical History:  Diagnosis Date  . Allergic rhinitis 06/30/2016  . Carotid  atherosclerosis, bilateral 11/06/2017   [place] Nov 2018  . Cyst of breast, left, solitary   . Flank pain   . GERD (gastroesophageal reflux disease)   . Hemorrhoid   . IFG (impaired fasting glucose) 07/18/2016   June 2017   . Kidney stones   . Neuropathy 07/24/2016  . Seizures ([place])   . Shingles outbreak 02/03/2017  . Syncope    Recurrent over the last 2 years. had workup at [facility] this year with cardiology and neurology. Had echo and 3-week event monitor that were unremarkable. Has had negative EEG. [facility] showed no significant abnormalities. Possibly vasovagal  . Trigger thumb    Past Surgical History:  Procedure Laterality Date  . ABDOMINAL HYSTERECTOMY  40s   endometriosis, fibroid tumors; no cancer, partial  . BREAST BIOPSY Left 1980'S   EXCISIONAL - NEG  . CHOLECYSTECTOMY  31  . FOOT SURGERY Right    tendon release   Family History  Problem Relation Age of Onset  . Coronary artery disease Maternal Uncle   . Diabetes Mellitus II Mother   . Arthritis Mother   . Diabetes Maternal Uncle   . Kidney cancer Father 37       died Apr 24, 2017  . Kidney disease Father   . Cancer Father  kidney and bladder  . Arthritis Maternal Uncle   . Cancer Maternal Grandmother        stomach?  . Jaundice Maternal Grandmother   . Stomach cancer Paternal Grandmother   . Cancer Paternal Grandmother        stomach  . Hypertension Sister   . Stroke Maternal Grandfather   . Diabetes Paternal Grandfather   . Diabetes Sister   . Hypertension Sister   . Breast cancer Neg Hx   . Bladder Cancer Neg Hx    Social History   Tobacco Use  . Smoking status: Never Smoker  . Smokeless tobacco: Never Used  Substance Use Topics  . Alcohol use: Yes    Alcohol/week: 0.0 standard drinks    Comment: occ  . Drug use: No    Interim medical history since last visit reviewed. Allergies and medications reviewed  Review of Systems Per HPI unless specifically indicated above       Objective:    BP 128/70   Pulse 65   Temp 98.3 F (36.8 C)   Ht 4\' 11"  (1.499 m)   Wt 132 lb 8 oz (60.1 kg)   SpO2 98%   BMI 26.76 kg/m   Wt Readings from Last 3 Encounters:  09/15/18 132 lb 8 oz (60.1 kg)  04/21/18 128 lb 1.6 oz (58.1 kg)  02/21/18 130 lb (59 kg)    Physical Exam  Constitutional: She appears well-developed and well-nourished. No distress.  HENT:  Head: Normocephalic and atraumatic.  Eyes: EOM are normal. No scleral icterus.  Neck: No thyromegaly present.  Cardiovascular: Normal rate, regular rhythm and normal heart sounds.  No murmur heard. Pulmonary/Chest: Effort normal and breath sounds normal. No respiratory distress. She has no wheezes.  Abdominal: Soft. Bowel sounds are normal. She exhibits no distension.  Musculoskeletal: She exhibits no edema.  Neurological: She is alert.  Skin: Skin is warm and dry. She is not diaphoretic. No pallor.  Psychiatric: She has a normal mood and affect. Her behavior is normal. Judgment and thought content normal.    Results for orders placed or performed in visit on 04/21/18  C. trachomatis/N. gonorrhoeae RNA  Result Value Ref Range   C. trachomatis RNA, TMA NOT DETECTED NOT DETECT   N. gonorrhoeae RNA, TMA NOT DETECTED NOT DETECT  WET PREP BY MOLECULAR PROBE  Result Value Ref Range   MICRO NUMBER: [id number]    SPECIMEN QUALITY: ADEQUATE    SOURCE: .    STATUS: FINAL    Trichomonas vaginosis Not Detected    Gardnerella vaginalis (A)     Detected. Increased levels of G. vaginalis may not be significant in the absence of signs and symptoms of bacterial vaginosis.   Candida species Not Detected   Urine Culture  Result Value Ref Range   MICRO NUMBER: [id number]    SPECIMEN QUALITY: ADEQUATE    Sample Source URINE    STATUS: FINAL    Result: No Growth   Hemoglobin A1c  Result Value Ref Range   Hgb A1c MFr Bld 5.7 (H) <5.7 % of total Hgb   Mean Plasma Glucose 117 (calc)   eAG (mmol/L) 6.5 (calc)  Lipid panel   Result Value Ref Range   Cholesterol 199 <200 mg/dL   HDL 94 >50 mg/dL   Triglycerides 50 <150 mg/dL   LDL Cholesterol (Calc) 92 mg/dL (calc)   Total CHOL/HDL Ratio 2.1 <5.0 (calc)   Non-HDL Cholesterol (Calc) 105 <130 mg/dL (calc)  COMPLETE METABOLIC PANEL WITH GFR  Result Value Ref Range   Glucose, Bld 103 65 - 139 mg/dL   BUN 14 7 - 25 mg/dL   Creat 0.87 0.50 - 1.05 mg/dL   GFR, Est Non African American 75 > OR = 60 mL/min/1.45m2   GFR, Est African American 87 > OR = 60 mL/min/1.78m2   BUN/Creatinine Ratio NOT APPLICABLE 6 - 22 (calc)   Sodium 141 135 - 146 mmol/L   Potassium 4.0 3.5 - 5.3 mmol/L   Chloride 113 (H) 98 - 110 mmol/L   CO2 23 20 - 32 mmol/L   Calcium 9.7 8.6 - 10.4 mg/dL   Total Protein 6.8 6.1 - 8.1 g/dL   Albumin 4.4 3.6 - 5.1 g/dL   Globulin 2.4 1.9 - 3.7 g/dL (calc)   AG Ratio 1.8 1.0 - 2.5 (calc)   Total Bilirubin 0.5 0.2 - 1.2 mg/dL   Alkaline phosphatase (APISO) 79 33 - 130 U/L   AST 22 10 - 35 U/L   ALT 19 6 - 29 U/L  Urine Microscopic  Result Value Ref Range   WBC, UA 0-5 0 - 5 /HPF   RBC / HPF NONE SEEN 0 - 2 /HPF   Squamous Epithelial / LPF 0-5 < OR = 5 /HPF   Bacteria, UA NONE SEEN NONE SEEN /HPF   Hyaline Cast NONE SEEN NONE SEEN /LPF  POCT urinalysis dipstick  Result Value Ref Range   Color, UA YELLOW    Clarity, UA CLEAR    Glucose, UA NEG    Bilirubin, UA NEG    Ketones, UA NEG    Spec Grav, UA >=1.030 (A) 1.010 - 1.025   Blood, UA POSITIVE    pH, UA 5.0 5.0 - 8.0   Protein, UA NEG    Urobilinogen, UA 0.2 0.2 or 1.0 E.U./dL   Nitrite, UA NEG    Leukocytes, UA Trace (A) Negative   Appearance CLEAR    Odor NORMAL       Assessment & Plan:   Problem List Items Addressed This Visit      Cardiovascular and Mediastinum   Basilar artery migraine   Carotid atherosclerosis, bilateral   Relevant Orders   Lipid panel   TIA (transient ischemic attack)    Seeing neurologist      Relevant Orders   Lipid panel     Respiratory    Allergic rhinitis    Chronic condition; controlled/stable; continue meds prn        Endocrine   IFG (impaired fasting glucose)    Check glucose (fasting) and A1c      Relevant Orders   Hemoglobin A1c     Other   Seizures (HCC)    Other Visit Diagnoses    Gastroesophageal reflux disease without esophagitis    -  Primary   check for H pylori; start H2 blocker; avoid triggers; discussed red flags, reasons to call or seek help right away   Relevant Medications   famotidine (PEPCID) 20 MG tablet   Other Relevant Orders   CBC with Differential/Platelet   COMPLETE METABOLIC PANEL WITH GFR   Epigastric pain       check labs to include H pylori; close f/u; call or to ER if worsening   Relevant Orders   CBC with Differential/Platelet   COMPLETE METABOLIC PANEL WITH GFR   H. pylori breath test   Pure hypercholesterolemia       Relevant Orders   Lipid panel   Need for influenza vaccination  provided and given today   Relevant Orders   Flu Vaccine QUAD 6+ mos PF IM (Fluarix Quad PF) (Completed)       Follow up plan: Return in about 4 weeks (around 10/13/2018) for follow-up visit with Dr. Sanda Klein to check on stomach symptoms.  An after-visit summary was printed and given to the patient at [place].  Please see the patient instructions which may contain other information and recommendations beyond what is mentioned above in the assessment and plan.  Meds ordered this encounter  Medications  . famotidine (PEPCID) 20 MG tablet    Sig: Take 1 tablet (20 mg total) by mouth 2 (two) times daily.    Dispense:  60 tablet    Refill:  0    Orders Placed This Encounter  Procedures  . Flu Vaccine QUAD 6+ mos PF IM (Fluarix Quad PF)  . CBC with Differential/Platelet  . COMPLETE METABOLIC PANEL WITH GFR  . Lipid panel  . H. pylori breath test  . Hemoglobin A1c

## 2018-09-15 NOTE — Assessment & Plan Note (Signed)
Seeing neurologist.

## 2018-09-16 ENCOUNTER — Ambulatory Visit
Admission: RE | Admit: 2018-09-16 | Discharge: 2018-09-16 | Disposition: A | Discharge status: Home / Self Care | Payer: Medicare Other | Source: Ambulatory Visit | Attending: Family Medicine | Admitting: Family Medicine

## 2018-09-16 DIAGNOSIS — Z1231 Encounter for screening mammogram for malignant neoplasm of breast: Secondary | ICD-10-CM

## 2018-09-16 LAB — CBC WITH DIFFERENTIAL/PLATELET
Basophils Absolute: 21 cells/uL (ref 0–200)
Basophils Relative: 0.5 %
Eosinophils Absolute: 21 cells/uL (ref 15–500)
Eosinophils Relative: 0.5 %
HCT: 40.5 % (ref 35.0–45.0)
Hemoglobin: 13.8 g/dL (ref 11.7–15.5)
Lymphs Abs: 2663 cells/uL (ref 850–3900)
MCH: 31.1 pg (ref 27.0–33.0)
MCHC: 34.1 g/dL (ref 32.0–36.0)
MCV: 91.2 fL (ref 80.0–100.0)
MPV: 8.7 fL (ref 7.5–12.5)
Monocytes Relative: 6 %
Neutro Abs: 1243 cells/uL — ABNORMAL LOW (ref 1500–7800)
Neutrophils Relative %: 29.6 %
Platelets: 303 10*3/uL (ref 140–400)
RBC: 4.44 10*6/uL (ref 3.80–5.10)
RDW: 12.7 % (ref 11.0–15.0)
Total Lymphocyte: 63.4 %
WBC mixed population: 252 cells/uL (ref 200–950)
WBC: 4.2 10*3/uL (ref 3.8–10.8)

## 2018-09-16 LAB — COMPLETE METABOLIC PANEL WITH GFR
AG Ratio: 2 (calc) (ref 1.0–2.5)
ALT: 23 U/L (ref 6–29)
AST: 21 U/L (ref 10–35)
Albumin: 4.3 g/dL (ref 3.6–5.1)
Alkaline phosphatase (APISO): 74 U/L (ref 33–130)
BUN: 12 mg/dL (ref 7–25)
CO2: 21 mmol/L (ref 20–32)
Calcium: 9.5 mg/dL (ref 8.6–10.4)
Chloride: 112 mmol/L — ABNORMAL HIGH (ref 98–110)
Creat: 0.9 mg/dL (ref 0.50–1.05)
GFR, Est African American: 83 mL/min/{1.73_m2} (ref >=60)
GFR, Est Non African American: 71 mL/min/{1.73_m2} (ref >=60)
Globulin: 2.1 g/dL (calc) (ref 1.9–3.7)
Glucose, Bld: 88 mg/dL (ref 65–99)
Potassium: 3.9 mmol/L (ref 3.5–5.3)
Sodium: 142 mmol/L (ref 135–146)
Total Bilirubin: 0.6 mg/dL (ref 0.2–1.2)
Total Protein: 6.4 g/dL (ref 6.1–8.1)

## 2018-09-16 LAB — LIPID PANEL
Cholesterol: 195 mg/dL (ref <200)
HDL: 95 mg/dL (ref >50)
LDL Cholesterol (Calc): 87 mg/dL (calc)
Non-HDL Cholesterol (Calc): 100 mg/dL (calc) (ref <130)
Total CHOL/HDL Ratio: 2.1 (calc) (ref <5.0)
Triglycerides: 43 mg/dL (ref <150)

## 2018-09-16 LAB — H. PYLORI BREATH TEST: H. pylori Breath Test: NOT DETECTED

## 2018-09-16 LAB — HEMOGLOBIN A1C W/OUT EAG: Hgb A1c MFr Bld: 5.6 % of total Hgb (ref <5.7)

## 2018-09-17 ENCOUNTER — Telehealth: Payer: Self-pay | Admitting: Family Medicine

## 2018-09-17 MED ORDER — ATORVASTATIN CALCIUM 80 MG PO TABS: 80.0000 mg | ORAL_TABLET | Freq: Every day | ORAL | 3 refills | Status: DC

## 2018-09-17 NOTE — Telephone Encounter (Signed)
Thank you I'll send new Rx for 80 mg to [facility]

## 2018-09-17 NOTE — Telephone Encounter (Signed)
Copied from [place] [id number]. Topic: General - Other >> Sep 17, 2018 10:52 AM Judyann Munson wrote: Reason for CRM: patient is calling to advise she is okay with increasing her atorvastatin (LIPITOR) 40 MG tablet. She is requesting the medication be sent to : [id number] - [place], [address] [phone] (Phone) [phone] (Fax)

## 2018-10-08 ENCOUNTER — Ambulatory Visit: Payer: Medicare Other | Admitting: Family Medicine

## 2018-10-08 ENCOUNTER — Other Ambulatory Visit: Payer: Self-pay | Admitting: Family Medicine

## 2018-10-13 ENCOUNTER — Ambulatory Visit (INDEPENDENT_AMBULATORY_CARE_PROVIDER_SITE_OTHER): Payer: Medicare Other | Admitting: Nurse Practitioner

## 2018-10-13 ENCOUNTER — Encounter: Payer: Self-pay | Admitting: Nurse Practitioner

## 2018-10-13 VITALS — BP 104/78 | HR 85 | Temp 98.4°F | Resp 12 | Ht 59.0 in | Wt 129.7 lb

## 2018-10-13 DIAGNOSIS — K219 Gastro-esophageal reflux disease without esophagitis: Secondary | ICD-10-CM

## 2018-10-13 DIAGNOSIS — J302 Other seasonal allergic rhinitis: Secondary | ICD-10-CM

## 2018-10-13 DIAGNOSIS — I6523 Occlusion and stenosis of bilateral carotid arteries: Secondary | ICD-10-CM

## 2018-10-13 MED ORDER — FAMOTIDINE 20 MG PO TABS: 20.0000 mg | ORAL_TABLET | Freq: Two times a day (BID) | ORAL | 2 refills | Status: DC | PRN

## 2018-10-13 NOTE — Patient Instructions (Signed)
-   Continue to take famotidine twice a day  - Can additionally take tums if needed  Food Choices for Gastroesophageal Reflux Disease, Adult When you have gastroesophageal reflux disease (GERD), the foods you eat and your eating habits are very important. Choosing the right foods can help ease your discomfort. What guidelines do I need to follow?  Choose fruits, vegetables, whole grains, and low-fat dairy products.  Choose low-fat meat, fish, and poultry.  Limit fats such as oils, salad dressings, butter, nuts, and avocado.  Keep a food diary. This helps you identify foods that cause symptoms.  Avoid foods that cause symptoms. These may be different for everyone.  Eat small meals often instead of 3 large meals a day.  Eat your meals slowly, in a place where you are relaxed.  Limit fried foods.  Cook foods using methods other than frying.  Avoid drinking alcohol.  Avoid drinking large amounts of liquids with your meals.  Avoid bending over or lying down until 2-3 hours after eating. What foods are not recommended? These are some foods and drinks that may make your symptoms worse: Vegetables Tomatoes. Tomato juice. Tomato and spaghetti sauce. Chili peppers. Onion and garlic. Horseradish. Fruits Oranges, grapefruit, and lemon (fruit and juice). Meats High-fat meats, fish, and poultry. This includes hot dogs, ribs, ham, sausage, salami, and bacon. Dairy Whole milk and chocolate milk. Sour cream. Cream. Butter. Ice cream. Cream cheese. Drinks Coffee and tea. Bubbly (carbonated) drinks or energy drinks. Condiments Hot sauce. Barbecue sauce. Sweets/Desserts Chocolate and cocoa. Donuts. Peppermint and spearmint. Fats and Oils High-fat foods. This includes [place] fries and potato chips. Other Vinegar. Strong spices. This includes black pepper, white pepper, red pepper, cayenne, curry powder, cloves, ginger, and chili powder. The items listed above may not be a complete list of  foods and drinks to avoid. Contact your dietitian for more information. This information is not intended to replace advice given to you by your health care provider. Make sure you discuss any questions you have with your health care provider. Document Released: 06/16/2012 Document Revised: 05/23/2016 Document Reviewed: 10/20/2013 Elsevier Interactive Patient Education  2017 [employer].

## 2018-10-13 NOTE — Progress Notes (Signed)
Name: Elizabeth Mata   MRN: [id number]    DOB: [birth year]   Date:10/13/2018       Progress Note  Subjective  Chief Complaint  Chief Complaint  Patient presents with  . Follow-up    stomach issue/GERD  . Medication Refill    pepcid    HPI  Was treated for acid- reflux at last month- famotidine BID. She had noted some heart burn intermittent over the past few months, states resolved with tums. States noticed it at night once and then after drinking coffee in the morning. Significant discomfort when laying down once. States medicine has shown some improvement. Has been out for a week had some return of symptoms- feels a little burning resolved with tums.  Denies choking, shortness of breath or food coming back up.   Patient also notes rhinorrhea ongoing intermittently last 2 weeks states had some bilateral ear pain last week self resolved after a few days. Takes allegra daily. flonase PRN. Patient Active Problem List   Diagnosis Date Noted  . Urinary urgency 04/21/2018  . Menopausal symptom 04/21/2018  . Urticaria 01/05/2018  . Carotid atherosclerosis, bilateral 11/06/2017  . TIA (transient ischemic attack) 10/28/2017  . Hx of completed stroke 10/28/2017  . Ataxia 10/08/2017  . Breast pain in female 07/11/2017  . Medication monitoring encounter 12/20/2016  . Abnormality of globulin 07/26/2016  . Neuropathy 07/24/2016  . 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 IFG (impaired fasting glucose) 07/18/2016  . Hematuria 07/03/2016  . Allergic rhinitis 06/30/2016  . Chronic left hip pain 06/28/2016  . Breast cancer screening 04/17/2016  . Skin texture changes 12/27/2015  . Vaginal discharge 10/31/2015  . Screening for STD (sexually transmitted disease) 10/31/2015  . Kidney stone on left side 10/24/2015  . Annual physical exam 09/13/2015  . Encounter for screening mammogram for malignant neoplasm of breast 09/13/2015  . Need for immunization against influenza 09/13/2015  .  Eczema 09/13/2015  . Irritable bowel syndrome with constipation 09/13/2015  . Seizures ([place]) 08/16/2015  . Carpal tunnel syndrome 07/26/2014  . Carotid artery narrowing 07/05/2013  . Basilar artery migraine 07/06/2012    Past Medical History:  Diagnosis Date  . Allergic rhinitis 06/30/2016  . Carotid atherosclerosis, bilateral 11/06/2017   [place] Nov 2018  . Cyst of breast, left, solitary   . Flank pain   . GERD (gastroesophageal reflux disease)   . Hemorrhoid   . IFG (impaired fasting glucose) 07/18/2016   June 2017   . Kidney stones   . Neuropathy 07/24/2016  . Seizures ([place])   . Shingles outbreak 02/03/2017  . Syncope    Recurrent over the last 2 years. had workup at [facility] this year with cardiology and neurology. Had echo and 3-week event monitor that were unremarkable. Has had negative EEG. Echo in 11/11 at [facility] showed no significant abnormalities. Possibly vasovagal  . Trigger thumb     Past Surgical History:  Procedure Laterality Date  . ABDOMINAL HYSTERECTOMY  40s   endometriosis, fibroid tumors; no cancer, partial  . BREAST EXCISIONAL BIOPSY Left 1980'S   EXCISIONAL - NEG  . CHOLECYSTECTOMY  31  . FOOT SURGERY Right    tendon release    Social History   Tobacco Use  . Smoking status: Never Smoker  . Smokeless tobacco: Never Used  Substance Use Topics  . Alcohol use: Yes    Alcohol/week: 0.0 standard drinks    Comment: occ     Current Outpatient Medications:  .  aspirin EC 81  MG EC tablet, Take 1 tablet (81 mg total) by mouth daily., Disp: 30 tablet, Rfl: 1 .  atorvastatin (LIPITOR) 80 MG tablet, Take 1 tablet (80 mg total) by mouth daily., Disp: 90 tablet, Rfl: 3 .  ciclopirox (LOPROX) 0.77 % cream, APPLY TOPICALLY 2 (TWO) TIMES DAILY. TO AFFECTED SKIN FOR UP TO 4 WEEKS prn, Disp: , Rfl: 0 .  fexofenadine (ALLEGRA) 180 MG tablet, Take 1 tablet (180 mg total) by mouth daily as needed for allergies or rhinitis., Disp: 90 tablet, Rfl: 3 .  fluticasone (FLONASE) 50  MCG/ACT nasal spray, Place 2 sprays into both nostrils daily. (Patient taking differently: Place 2 sprays into both nostrils daily as needed. ), Disp: 48 g, Rfl: 3 .  topiramate (TOPAMAX) 200 MG tablet, TAKE 1 TABLET (200 MG TOTAL) BY MOUTH 2 (TWO) TIMES DAILY., Disp: , Rfl: 11 .  triamcinolone ointment (KENALOG) 0.1 %, APPLY ON THE SKIN TWICE A DAY AS NEEDED AS NEEDED RASH NOT TO FACE, Disp: , Rfl: 1 .  famotidine (PEPCID) 20 MG tablet, Take 1 tablet (20 mg total) by mouth 2 (two) times daily as needed. (Patient not taking: Reported on 10/13/2018), Disp: 60 tablet, Rfl: 2  Allergies  Allergen Reactions  . Clindamycin Hcl Hives  . Clindamycin/Lincomycin Hives  . Hydrocodone Nausea Only  . Metronidazole Hives  . Sulfa Antibiotics Hives    Break out in hives all over  . Oxycodone Nausea And Vomiting    ROS   No other specific complaints in a complete review of systems (except as listed in HPI above).  Objective  Vitals:   10/13/18 1417  BP: 104/78  Pulse: 85  Resp: 12  Temp: 98.4 F (36.9 C)  TempSrc: Oral  SpO2: 99%  Weight: 129 lb 11.2 oz (58.8 kg)  Height: 4\' 11"  (1.499 m)    Body mass index is 26.2 kg/m.  Nursing Note and Vital Signs reviewed.  Physical Exam  Constitutional: She appears well-developed and well-nourished.  HENT:  Head: Normocephalic and atraumatic.  Right Ear: Hearing, tympanic membrane, external ear and ear canal normal.  Left Ear: Hearing, tympanic membrane, external ear and ear canal normal.  Nose: Nose normal. Right sinus exhibits no maxillary sinus tenderness and no frontal sinus tenderness. Left sinus exhibits no maxillary sinus tenderness and no frontal sinus tenderness.  Mouth/Throat: Oropharynx is clear and moist. No oropharyngeal exudate.  Eyes: Pupils are equal, round, and reactive to light. Conjunctivae and EOM are normal.  Neck: Normal range of motion. Neck supple.  Cardiovascular: Normal heart sounds and intact distal pulses.   Pulmonary/Chest: Effort normal and breath sounds normal.  Abdominal: Soft. Bowel sounds are normal. There is no tenderness.  Lymphadenopathy:    She has no cervical adenopathy.  Skin: Skin is warm and dry.  Psychiatric: She has a normal mood and affect. Her behavior is normal. Judgment and thought content normal.       No results found for this or any previous visit (from the past 48 hour(s)).  Assessment & Plan  1. Gastroesophageal reflux disease without esophagitis Discussed diet, portion size, wedge, if worsening or unimproved return  - famotidine (PEPCID) 20 MG tablet; Take 1 tablet (20 mg total) by mouth 2 (two) times daily as needed.  Dispense: 60 tablet; Refill: 2  2. Seasonal allergies Has been on allergra for years, consider switching to another antihistamine for awhile. - can try Singulair if unimproved.

## 2018-10-27 ENCOUNTER — Emergency Department
Admission: EM | Admit: 2018-10-27 | Discharge: 2018-10-27 | Disposition: A | Discharge status: Home / Self Care | Payer: Medicare Other | Attending: Emergency Medicine | Admitting: Emergency Medicine

## 2018-10-27 ENCOUNTER — Other Ambulatory Visit: Payer: Self-pay

## 2018-10-27 ENCOUNTER — Encounter: Payer: Self-pay | Admitting: Family Medicine

## 2018-10-27 ENCOUNTER — Emergency Department: Payer: Medicare Other

## 2018-10-27 ENCOUNTER — Encounter: Payer: Self-pay | Admitting: Emergency Medicine

## 2018-10-27 ENCOUNTER — Ambulatory Visit (INDEPENDENT_AMBULATORY_CARE_PROVIDER_SITE_OTHER): Payer: Medicare Other | Admitting: Family Medicine

## 2018-10-27 VITALS — BP 128/78 | Resp 16 | Ht 59.0 in | Wt 133.5 lb

## 2018-10-27 DIAGNOSIS — I6523 Occlusion and stenosis of bilateral carotid arteries: Secondary | ICD-10-CM | POA: Diagnosis not present

## 2018-10-27 DIAGNOSIS — R002 Palpitations: Secondary | ICD-10-CM | POA: Diagnosis not present

## 2018-10-27 DIAGNOSIS — R42 Dizziness and giddiness: Secondary | ICD-10-CM | POA: Insufficient documentation

## 2018-10-27 DIAGNOSIS — Z8673 Personal history of transient ischemic attack (TIA), and cerebral infarction without residual deficits: Secondary | ICD-10-CM

## 2018-10-27 DIAGNOSIS — I6529 Occlusion and stenosis of unspecified carotid artery: Secondary | ICD-10-CM

## 2018-10-27 DIAGNOSIS — R4020 Unspecified coma: Secondary | ICD-10-CM | POA: Diagnosis not present

## 2018-10-27 LAB — URINALYSIS, COMPLETE (UACMP) WITH MICROSCOPIC
Bacteria, UA: NONE SEEN
Bilirubin Urine: NEGATIVE
Glucose, UA: NEGATIVE mg/dL
Ketones, ur: NEGATIVE mg/dL
Leukocytes, UA: NEGATIVE
Nitrite: NEGATIVE
Protein, ur: NEGATIVE mg/dL
Specific Gravity, Urine: 1.009 (ref 1.005–1.030)
pH: 6 (ref 5.0–8.0)

## 2018-10-27 LAB — BASIC METABOLIC PANEL
Anion gap: 7 (ref 5–15)
BUN: 11 mg/dL (ref 6–20)
CO2: 25 mmol/L (ref 22–32)
Calcium: 9.8 mg/dL (ref 8.9–10.3)
Chloride: 114 mmol/L — ABNORMAL HIGH (ref 98–111)
Creatinine, Ser: 0.92 mg/dL (ref 0.44–1.00)
GFR calc Af Amer: >60 mL/min (ref >60)
GFR calc non Af Amer: >60 mL/min (ref >60)
Glucose, Bld: 101 mg/dL — ABNORMAL HIGH (ref 70–99)
Potassium: 3.6 mmol/L (ref 3.5–5.1)
Sodium: 146 mmol/L — ABNORMAL HIGH (ref 135–145)

## 2018-10-27 LAB — CBC
HCT: 42.7 % (ref 36.0–46.0)
Hemoglobin: 14.1 g/dL (ref 12.0–15.0)
MCH: 30.9 pg (ref 26.0–34.0)
MCHC: 33 g/dL (ref 30.0–36.0)
MCV: 93.4 fL (ref 80.0–100.0)
Platelets: 295 10*3/uL (ref 150–400)
RBC: 4.57 MIL/uL (ref 3.87–5.11)
RDW: 12.6 % (ref 11.5–15.5)
WBC: 4.5 10*3/uL (ref 4.0–10.5)
nRBC: 0 % (ref 0.0–0.2)

## 2018-10-27 LAB — TROPONIN I: Troponin I: <0.03 ng/mL (ref <0.03)

## 2018-10-27 LAB — TSH: TSH: 1.589 u[IU]/mL (ref 0.350–4.500)

## 2018-10-27 NOTE — ED Notes (Signed)
Pt alert and oriented X4, active, cooperative, pt in NAD. RR even and unlabored, color WNL.  Pt informed to return if any life threatening symptoms occur.  Discharge and followup instructions reviewed. Ambulates safely. 

## 2018-10-27 NOTE — ED Notes (Signed)
First nurse note: Pt arrives via ems with concerns over dizziness intermittently for the last 2 weeks. Pt concerned due to hx of stroke, tia. PT a & o x 4, no pain, vitals WDL for ems. PT in NAD at this time.

## 2018-10-27 NOTE — Progress Notes (Addendum)
A holter monitor was placed on the patient per Dr. Jimmye Norman order.

## 2018-10-27 NOTE — Progress Notes (Addendum)
Name: Elizabeth Mata   MRN: [id number]    DOB: 1961-02-12   Date:10/27/2018       Progress Note  Subjective  Chief Complaint  Chief Complaint  Patient presents with  . Dizziness    Onset-2 weeks, lightheadness, dizziness, and nausea. Just states her symptoms will happen all throughout the day.   . Cough    Dry Cough-wakes her up at the middle night  . Acne    Has not had trouble with acne before but recently started experiencing in all over her forehead.    HPI  Pt in c/o dizziness that started approximately three weeks ago, symptoms are getting progressively worse and occurring more often. Dizziness will last a few minutes and then resolves on its own, in the last week the symptoms are occurring more frequently and started to last longer, some episodes have lasted closer to 30 minutes. When this occurs she reports weakness and feeling "loopy", also notices palpitations when this occurs, also nausea during episodes. Denies vision changes, but does report some unilateral weakness when the dizziness occurs. Most recent episode of dizziness occurred this am. Pt is also having intermittent headaches that are at times severe, denies headache at this time.  She has history of TIA, completed CVA, hx palpitations but declined to wear holter monitor at that time, has carotid atherosclerosis and carotid artery narrowing.  Patient Active Problem List   Diagnosis Date Noted  . Urinary urgency 04/21/2018  . Menopausal symptom 04/21/2018  . Urticaria 01/05/2018  . Carotid atherosclerosis, bilateral 11/06/2017  . TIA (transient ischemic attack) 10/28/2017  . Hx of completed stroke 10/28/2017  . Ataxia 10/08/2017  . Breast pain in female 07/11/2017  . Medication monitoring encounter 12/20/2016  . Abnormality of globulin 07/26/2016  . Neuropathy 07/24/2016  . 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 IFG (impaired fasting glucose) 07/18/2016  . Hematuria 07/03/2016  . Allergic rhinitis  06/30/2016  . Chronic left hip pain 06/28/2016  . Breast cancer screening 04/17/2016  . Skin texture changes 12/27/2015  . Vaginal discharge 10/31/2015  . Screening for STD (sexually transmitted disease) 10/31/2015  . Kidney stone on left side 10/24/2015  . Annual physical exam 09/13/2015  . Encounter for screening mammogram for malignant neoplasm of breast 09/13/2015  . Need for immunization against influenza 09/13/2015  . Eczema 09/13/2015  . Irritable bowel syndrome with constipation 09/13/2015  . Seizures ([place]) 08/16/2015  . Carpal tunnel syndrome 07/26/2014  . Carotid artery narrowing 07/05/2013  . Basilar artery migraine 07/06/2012    Social History   Tobacco Use  . Smoking status: Never Smoker  . Smokeless tobacco: Never Used  Substance Use Topics  . Alcohol use: Yes    Alcohol/week: 0.0 standard drinks    Comment: occ     Current Outpatient Medications:  .  aspirin EC 81 MG EC tablet, Take 1 tablet (81 mg total) by mouth daily., Disp: 30 tablet, Rfl: 1 .  atorvastatin (LIPITOR) 80 MG tablet, Take 1 tablet (80 mg total) by mouth daily., Disp: 90 tablet, Rfl: 3 .  ciclopirox (LOPROX) 0.77 % cream, APPLY TOPICALLY 2 (TWO) TIMES DAILY. TO AFFECTED SKIN FOR UP TO 4 WEEKS prn, Disp: , Rfl: 0 .  famotidine (PEPCID) 20 MG tablet, Take 1 tablet (20 mg total) by mouth 2 (two) times daily as needed., Disp: 60 tablet, Rfl: 2 .  fexofenadine (ALLEGRA) 180 MG tablet, Take 1 tablet (180 mg total) by mouth daily as needed for allergies or rhinitis.,  Disp: 90 tablet, Rfl: 3 .  fluticasone (FLONASE) 50 MCG/ACT nasal spray, Place 2 sprays into both nostrils daily. (Patient taking differently: Place 2 sprays into both nostrils daily as needed. ), Disp: 48 g, Rfl: 3 .  topiramate (TOPAMAX) 200 MG tablet, TAKE 1 TABLET (200 MG TOTAL) BY MOUTH 2 (TWO) TIMES DAILY., Disp: , Rfl: 11 .  triamcinolone ointment (KENALOG) 0.1 %, APPLY ON THE SKIN TWICE A DAY AS NEEDED AS NEEDED RASH NOT TO FACE,  Disp: , Rfl: 1  Allergies  Allergen Reactions  . Clindamycin Hcl Hives  . Clindamycin/Lincomycin Hives  . Hydrocodone Nausea Only  . Metronidazole Hives  . Sulfa Antibiotics Hives    Break out in hives all over  . Oxycodone Nausea And Vomiting    I personally reviewed active problem list, medication list, allergies, lab results with the patient/caregiver today.  ROS  Ten systems reviewed and is negative except as mentioned in HPI   Objective  Vitals:   10/27/18 1304  BP: 128/78  Resp: 16  Weight: 133 lb 8 oz (60.6 kg)  Height: 4\' 11"  (1.499 m)     Body mass index is 26.96 kg/m.  Nursing Note and Vital Signs reviewed.  Physical Exam  Constitutional: Patient appears well-developed and well-nourished. No distress.  Neurological: Equal strength and movement in bilateral extremities, no ataxia, intact sensation bilaterally, no facial droop, no slurred speech, oriented x4, normal gait, denies headache. Pt did have episode of dizziness in office after EKG was completed. HEENT: head atraumatic, normocephalic, pupils equal and reactive to light, Bilateral TM's without erythema or effusion,  bilateral maxillary and frontal sinuses are non-tender, neck supple without lymphadenopathy, throat within normal limits - no erythema or exudate, no tonsillar swelling Cardiovascular: Normal rate, regular rhythm and normal heart sounds.  No murmur heard. No BLE edema. Pulmonary/Chest: Effort normal and breath sounds clear bilaterally. No respiratory distress. Abdominal: Soft, bowel sounds normal, there is no tenderness, no HSM Psychiatric: Patient has a normal mood and affect. behavior is normal. Judgment and thought content normal.  No results found for this or any previous visit (from the past 72 hour(s)).  Assessment & Plan   1. Dizziness 2. Palpitations 3. History TIA (transient ischemic attack) 4. Carotid atherosclerosis, bilateral 5. Stenosis of carotid artery, unspecified  laterality Due to patient ongoing symptoms of dizziness, palpitations and at times unilateral weakness, along with her significant history of stroke and TIA, and carotid artery stenosis it is recommended that patient go to the emergency department from office. Since patient experienced episode of symptoms while in office we will call EMS to ensure safe transport.  EKG: sinus bradycardia,no ST elevation, however possible Twave changes from previous EKG in 2018.

## 2018-10-27 NOTE — ED Triage Notes (Signed)
Says has been having dizziness and episodes of heart palpiations for about 2 weeks.  Went to [facility] today and they are worried about tia.  She did have tia last year.  Says she just feels tired.

## 2018-10-27 NOTE — ED Provider Notes (Signed)
[facility] Emergency Department Provider Note       Time seen: ----------------------------------------- 4:29 PM on 10/27/2018 -----------------------------------------   I have reviewed the triage vital signs and the nursing notes.  HISTORY   Chief Complaint Dizziness and Palpitations    HPI Elizabeth Mata is a 57 y.o. female with a history of GERD, kidney stones, seizures, syncope who presents to the ED for dizziness and near syncopal episodes for the past several weeks.  Patient states her heart is racing during these events, she went to [facility] today and was told that she could possibly have a TIA.  Patient states she feels tired.  She denies any recent illness or other complaints.  Patient reports she is eating and drinking normally.  Past Medical History:  Diagnosis Date  . Allergic rhinitis 06/30/2016  . Carotid atherosclerosis, bilateral 11/06/2017   [place] Nov 2018  . Cyst of breast, left, solitary   . Flank pain   . GERD (gastroesophageal reflux disease)   . Hemorrhoid   . IFG (impaired fasting glucose) 07/18/2016   June 2017   . Kidney stones   . Neuropathy 07/24/2016  . Seizures ([place])   . Shingles outbreak 02/03/2017  . Syncope    Recurrent over the last 2 years. had workup at [facility] this year with cardiology and neurology. Had echo and 3-week event monitor that were unremarkable. Has had negative EEG. Echo in 11/11 at [facility] showed no significant abnormalities. Possibly vasovagal  . Trigger thumb     Patient Active Problem List   Diagnosis Date Noted  . Dizziness 10/27/2018  . Palpitations 10/27/2018  . Urinary urgency 04/21/2018  . Menopausal symptom 04/21/2018  . Urticaria 01/05/2018  . Carotid atherosclerosis, bilateral 11/06/2017  . TIA (transient ischemic attack) 10/28/2017  . Hx of completed stroke 10/28/2017  . Ataxia 10/08/2017  . Breast pain in female 07/11/2017  . Medication monitoring encounter 12/20/2016  .  Abnormality of globulin 07/26/2016  . Neuropathy 07/24/2016  . 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 IFG (impaired fasting glucose) 07/18/2016  . Hematuria 07/03/2016  . Allergic rhinitis 06/30/2016  . Chronic left hip pain 06/28/2016  . Breast cancer screening 04/17/2016  . Skin texture changes 12/27/2015  . Vaginal discharge 10/31/2015  . Screening for STD (sexually transmitted disease) 10/31/2015  . Kidney stone on left side 10/24/2015  . Annual physical exam 09/13/2015  . Encounter for screening mammogram for malignant neoplasm of breast 09/13/2015  . Need for immunization against influenza 09/13/2015  . Eczema 09/13/2015  . Irritable bowel syndrome with constipation 09/13/2015  . Seizures ([place]) 08/16/2015  . Carpal tunnel syndrome 07/26/2014  . Carotid artery narrowing 07/05/2013  . 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 ABDOMINAL HYSTERECTOMY  40s   endometriosis, fibroid tumors; no cancer, partial  . BREAST EXCISIONAL BIOPSY Left 1980'S   EXCISIONAL - NEG  . CHOLECYSTECTOMY  31  . FOOT SURGERY Right    tendon release    Allergies Clindamycin hcl; Clindamycin/lincomycin; Hydrocodone; Metronidazole; Sulfa antibiotics; and Oxycodone  Social History Social History   Tobacco Use  . Smoking status: Never Smoker  . Smokeless tobacco: Never Used  Substance Use Topics  . Alcohol use: Yes    Alcohol/week: 0.0 standard drinks    Comment: occ  . Drug use: No   Review of Systems Constitutional: Negative for fever. Cardiovascular: Negative for chest pain.  Positive for palpitations Respiratory: Negative for shortness of breath.  Gastrointestinal: Negative for abdominal pain, vomiting and diarrhea. Musculoskeletal: Negative for back pain. Skin: Negative for rash. Neurological: Negative for headaches, focal weakness or numbness.  All systems negative/normal/unremarkable except as stated in the  HPI  ____________________________________________   PHYSICAL EXAM:  VITAL SIGNS: ED Triage Vitals  Enc Vitals Group     BP 10/27/18 1432 132/89     Pulse Rate 10/27/18 1432 73     Resp 10/27/18 1432 16     Temp 10/27/18 1432 97.7 F (36.5 C)     Temp Source 10/27/18 1432 Oral     SpO2 10/27/18 1432 100 %     Weight 10/27/18 1433 133 lb 6.1 oz (60.5 kg)     Height 10/27/18 1433 4\' 11"  (1.499 m)     Head Circumference --      Peak Flow --      Pain Score 10/27/18 1432 0     Pain Loc --      Pain Edu? --      Excl. in [place]? --    Constitutional: Alert and oriented. Well appearing and in no distress. Eyes: Conjunctivae are normal. Normal extraocular movements. ENT   Head: Normocephalic and atraumatic.   Nose: No congestion/rhinnorhea.   Mouth/Throat: Mucous membranes are moist.   Neck: No stridor. Cardiovascular: Normal rate, regular rhythm. No murmurs, rubs, or gallops. Respiratory: Normal respiratory effort without tachypnea nor retractions. Breath sounds are clear and equal bilaterally. No wheezes/rales/rhonchi. Gastrointestinal: Soft and nontender. Normal bowel sounds Musculoskeletal: Nontender with normal range of motion in extremities. No lower extremity tenderness nor edema. Neurologic:  Normal speech and language. No gross focal neurologic deficits are appreciated.  Skin:  Skin is warm, dry and intact. No rash noted. Psychiatric: Mood and affect are normal. Speech and behavior are normal.  ____________________________________________  EKG: Interpreted by me.  Sinus bradycardia rate 56 bpm, normal PR interval, normal QRS, normal QT  ____________________________________________  ED COURSE:  As part of my medical decision making, I reviewed the following data within the [place] History obtained from family if available, nursing notes, old chart and ekg, as well as notes from prior ED visits. Patient presented for dizziness and  palpitations, we will assess with labs and imaging as indicated at this time.   Procedures ____________________________________________   LABS (pertinent positives/negatives)  Labs Reviewed  BASIC METABOLIC PANEL - Abnormal; Notable for the following components:      Result Value   Sodium 146 (*)    Chloride 114 (*)    Glucose, Bld 101 (*)    All other components within normal limits  URINALYSIS, COMPLETE (UACMP) WITH MICROSCOPIC - Abnormal; Notable for the following components:   Color, Urine STRAW (*)    APPearance CLEAR (*)    Hgb urine dipstick LARGE (*)    All other components within normal limits  CBC  TROPONIN I  TSH   ___________________________________________  DIFFERENTIAL DIAGNOSIS   Arrhythmia, dehydration, electrolyte abnormality, anemia, MI, anxiety  FINAL ASSESSMENT AND PLAN  Dizziness, palpitations   Plan: The patient had presented for intermittent episodes of dizziness and near syncope. Patient's labs were reassuring.  We did not find any clear etiology for her symptoms.  We have placed her on a Holter monitor and she will be referred to cardiology for outpatient follow-up.   Laurence Aly, MD   Note: This note was generated in part or whole with voice recognition software. Voice recognition is usually quite accurate but there are transcription  errors that can and very often do occur. I apologize for any typographical errors that were not detected and corrected.     Earleen Newport, MD 10/27/18 [phone]

## 2018-10-27 NOTE — Addendum Note (Signed)
Addended by: Hubbard Hartshorn on: 10/27/2018 01:51 PM   Modules accepted: Orders

## 2018-11-01 ENCOUNTER — Telehealth: Payer: Self-pay | Admitting: Family Medicine

## 2018-11-01 NOTE — Telephone Encounter (Signed)
Patient had a large amount of blood in her urine in the ER Please ask her to follow-up with her urologist Enter referral if needed (hematuria) Thank you

## 2018-11-02 NOTE — Telephone Encounter (Signed)
Left detailed voicemail

## 2018-11-03 ENCOUNTER — Telehealth: Payer: Self-pay

## 2018-11-03 DIAGNOSIS — I6523 Occlusion and stenosis of bilateral carotid arteries: Secondary | ICD-10-CM

## 2018-11-03 DIAGNOSIS — Z8673 Personal history of transient ischemic attack (TIA), and cerebral infarction without residual deficits: Secondary | ICD-10-CM

## 2018-11-03 DIAGNOSIS — R002 Palpitations: Secondary | ICD-10-CM

## 2018-11-03 DIAGNOSIS — R42 Dizziness and giddiness: Secondary | ICD-10-CM

## 2018-11-03 NOTE — Telephone Encounter (Signed)
When I saw her I sent her to the ER for dizziness and palpitations.  It looks like Dr. Sanda Klein referred her back in December 2018 and the patient canceled 2 separate appointments with Dr. Donivan Scull office.  I have resent the referral, please stress the importance of the patient attending her appointment with cardiology.

## 2018-11-03 NOTE — Telephone Encounter (Signed)
Looks like you were the last to see her, for palpitations?

## 2018-11-03 NOTE — Telephone Encounter (Signed)
Copied from [place] [id number]. Topic: Referral - Status >> Nov 03, 2018 11:50 AM Elizabeth Mata wrote: Reason for CRM: pt states that Dr Sanda Klein was going to put in a referral for her to see a cardiologist. Patient is checking the status

## 2018-11-03 NOTE — Telephone Encounter (Signed)
Pt.notified

## 2018-11-05 DIAGNOSIS — G43109 Migraine with aura, not intractable, without status migrainosus: Secondary | ICD-10-CM | POA: Diagnosis not present

## 2018-11-05 DIAGNOSIS — I6523 Occlusion and stenosis of bilateral carotid arteries: Secondary | ICD-10-CM | POA: Diagnosis not present

## 2018-11-05 DIAGNOSIS — I6529 Occlusion and stenosis of unspecified carotid artery: Secondary | ICD-10-CM | POA: Diagnosis not present

## 2018-11-05 DIAGNOSIS — G459 Transient cerebral ischemic attack, unspecified: Secondary | ICD-10-CM | POA: Diagnosis not present

## 2018-11-05 DIAGNOSIS — R079 Chest pain, unspecified: Secondary | ICD-10-CM | POA: Diagnosis not present

## 2018-11-10 ENCOUNTER — Other Ambulatory Visit: Payer: Self-pay

## 2018-11-10 ENCOUNTER — Ambulatory Visit
Admission: RE | Admit: 2018-11-10 | Discharge: 2018-11-10 | Disposition: A | Discharge status: Home / Self Care | Payer: Medicare Other | Source: Ambulatory Visit | Attending: Emergency Medicine | Admitting: Emergency Medicine

## 2018-11-10 DIAGNOSIS — R5383 Other fatigue: Secondary | ICD-10-CM | POA: Insufficient documentation

## 2018-11-10 DIAGNOSIS — I493 Ventricular premature depolarization: Secondary | ICD-10-CM | POA: Diagnosis not present

## 2018-11-10 DIAGNOSIS — R42 Dizziness and giddiness: Secondary | ICD-10-CM | POA: Diagnosis not present

## 2018-11-10 DIAGNOSIS — I491 Atrial premature depolarization: Secondary | ICD-10-CM | POA: Diagnosis not present

## 2018-11-11 NOTE — CV Procedure (Signed)
48-hour Holter Date of hookup October 27, 2018 Indication is vertigo fatigue Referring physician Lenise Arena Cardiologist Bartholome Bill  Total beats 195, 495 Minimum rate 38 maximum 162 average 68 Mostly sinus rhythm Rare PVCs Short runs of PACs No runs No pauses No high-grade block No ST segment changes No diary submitted . Conclusion Relatively benign Holter Consider low-dose beta-blockade therapy if needed for symptom management No indication for permanent pacemaker high-grade antiarrhythmic

## 2018-11-18 DIAGNOSIS — R079 Chest pain, unspecified: Secondary | ICD-10-CM | POA: Diagnosis not present

## 2018-12-03 DIAGNOSIS — G459 Transient cerebral ischemic attack, unspecified: Secondary | ICD-10-CM | POA: Diagnosis not present

## 2018-12-03 DIAGNOSIS — I6529 Occlusion and stenosis of unspecified carotid artery: Secondary | ICD-10-CM | POA: Diagnosis not present

## 2018-12-03 DIAGNOSIS — G43109 Migraine with aura, not intractable, without status migrainosus: Secondary | ICD-10-CM | POA: Diagnosis not present

## 2018-12-04 DIAGNOSIS — L700 Acne vulgaris: Secondary | ICD-10-CM | POA: Diagnosis not present

## 2018-12-04 DIAGNOSIS — L659 Nonscarring hair loss, unspecified: Secondary | ICD-10-CM | POA: Diagnosis not present

## 2019-01-30 IMAGING — CR DG ABDOMEN 1V
1 series · 2 of 2 positions shown · non-contrast
Comparison: CT 07/09/2016

CLINICAL DATA: Abdominal pain from left kidney stone.

EXAM:
ABDOMEN - 1 VIEW

[Series 1: dg abd 1 view · 0.14mm/px · 2 of 2 slices shown]
[im 1/2]
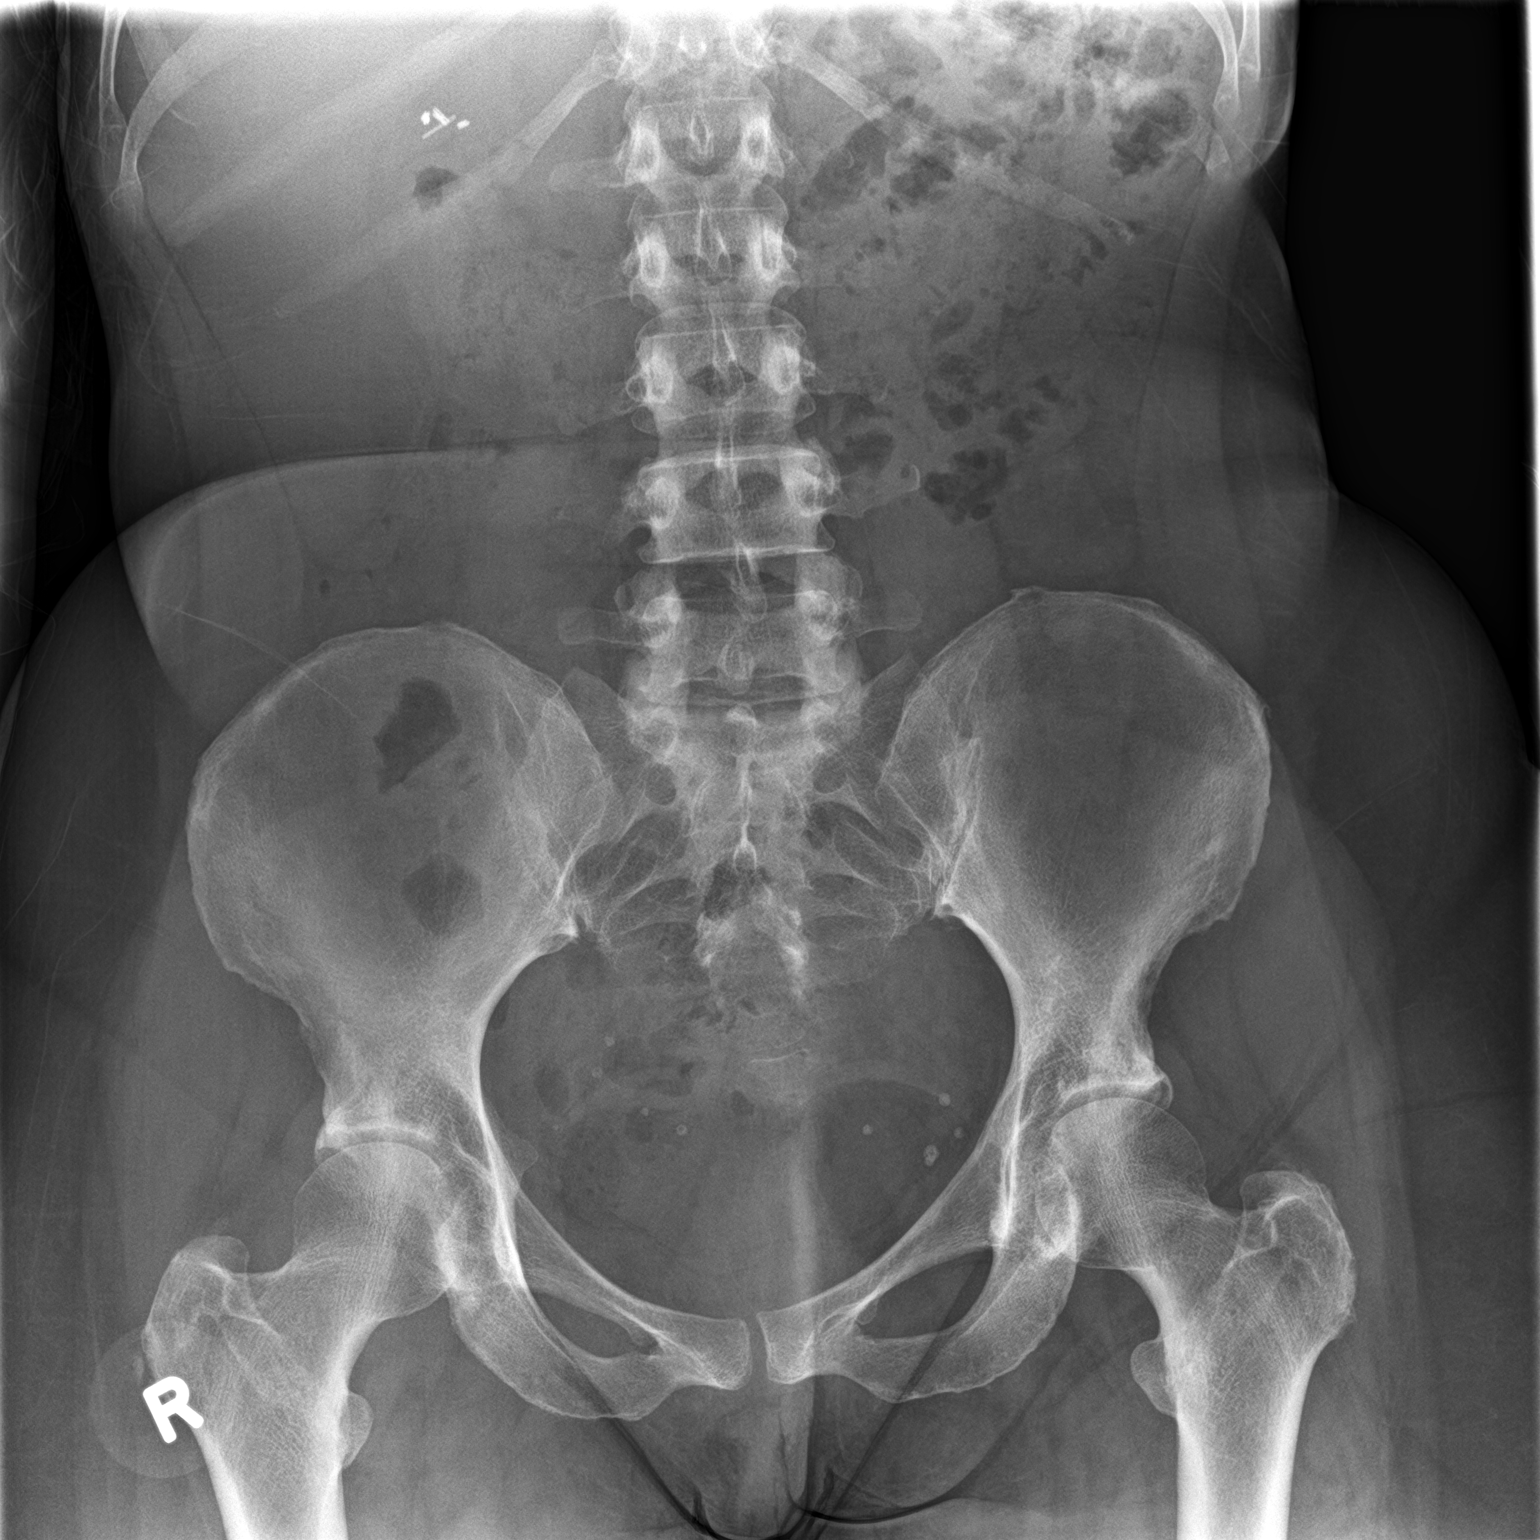
[im 2/2]
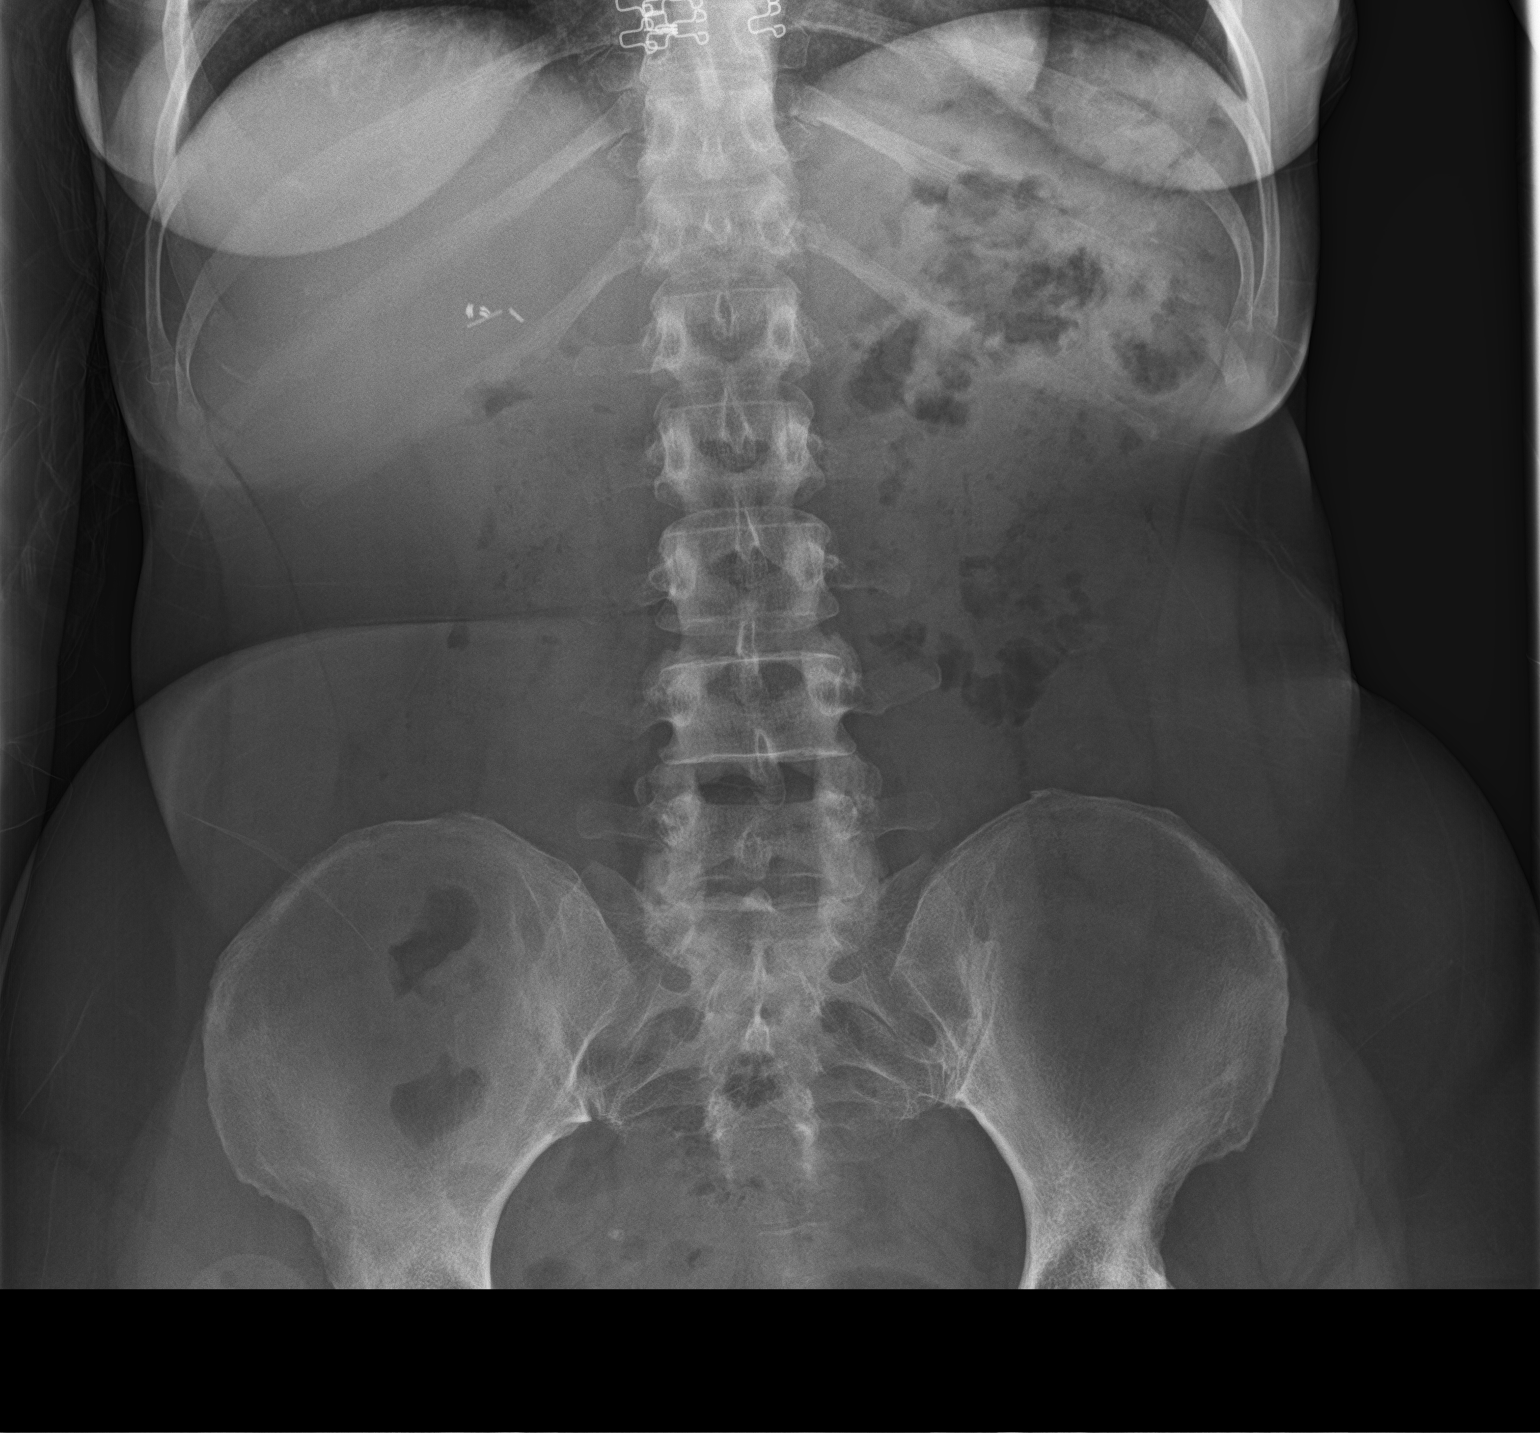

[2 of 2 positions shown; findings below may reference images not displayed]

FINDINGS: Bowel gas pattern is nonobstructive. No definite calcifications
within the abdomen. Several small phleboliths over the pelvis. Right
upper quadrant surgical clips are present. Remainder of the exam is
unremarkable.
IMPRESSION: Nonobstructive bowel gas pattern.

## 2019-02-08 DIAGNOSIS — G43109 Migraine with aura, not intractable, without status migrainosus: Secondary | ICD-10-CM | POA: Diagnosis not present

## 2019-02-12 ENCOUNTER — Ambulatory Visit (INDEPENDENT_AMBULATORY_CARE_PROVIDER_SITE_OTHER): Payer: Medicare Other

## 2019-02-12 VITALS — BP 102/70 | HR 77 | Temp 97.8°F | Resp 16 | Ht 59.0 in | Wt 138.3 lb

## 2019-02-12 DIAGNOSIS — Z0000 Encounter for general adult medical examination without abnormal findings: Secondary | ICD-10-CM

## 2019-02-12 DIAGNOSIS — Z0100 Encounter for examination of eyes and vision without abnormal findings: Secondary | ICD-10-CM | POA: Diagnosis not present

## 2019-02-12 DIAGNOSIS — Z1211 Encounter for screening for malignant neoplasm of colon: Secondary | ICD-10-CM | POA: Diagnosis not present

## 2019-02-12 NOTE — Progress Notes (Signed)
Subjective:   Elizabeth Mata is a 58 y.o. female who presents for an Initial Medicare Annual Wellness Visit.  Review of Systems      Cardiac Risk Factors include: dyslipidemia     Objective:    Today's Vitals   02/12/19 1142  BP: 102/70  Pulse: 77  Resp: 16  Temp: 97.8 F (36.6 C)  TempSrc: Oral  SpO2: 98%  Weight: 138 lb 4.8 oz (62.7 kg)  Height: 4\' 11"  (1.499 m)   Body mass index is 27.93 kg/m.  Advanced Directives 02/12/2019 10/27/2018 02/21/2018 01/23/2018 10/27/2017 10/08/2017 10/08/2017  Does Patient Have a Medical Advance Directive? No No No No No No No  Type of Advance Directive - - - - - - -  Does patient want to make changes to medical advance directive? - - - - - - -  Copy of [place] in Chart? - - - - - - -  Would patient like information on creating a medical advance directive? Yes (MAU/Ambulatory/Procedural Areas - Information given) - - No - Patient declined - No - Patient declined -    Current Medications (verified) Outpatient Encounter Medications as of 02/12/2019  Medication Sig  . aspirin EC 81 MG EC tablet Take 1 tablet (81 mg total) by mouth daily.  Marland Kitchen atorvastatin (LIPITOR) 80 MG tablet Take 1 tablet (80 mg total) by mouth daily.  . ciclopirox (LOPROX) 0.77 % cream APPLY TOPICALLY 2 (TWO) TIMES DAILY. TO AFFECTED SKIN FOR UP TO 4 WEEKS prn  . famotidine (PEPCID) 20 MG tablet Take 1 tablet (20 mg total) by mouth 2 (two) times daily as needed.  . fexofenadine (ALLEGRA) 180 MG tablet Take 1 tablet (180 mg total) by mouth daily as needed for allergies or rhinitis.  . fluticasone (FLONASE) 50 MCG/ACT nasal spray Place 2 sprays into both nostrils daily. (Patient taking differently: Place 2 sprays into both nostrils daily as needed. )  . topiramate (TOPAMAX) 200 MG tablet TAKE 1 TABLET (200 MG TOTAL) BY MOUTH 2 (TWO) TIMES DAILY.  Marland Kitchen triamcinolone ointment (KENALOG) 0.1 % APPLY ON THE SKIN TWICE A DAY AS NEEDED AS NEEDED RASH NOT TO  FACE   No facility-administered encounter medications on file as of 02/12/2019.     Allergies (verified) Clindamycin hcl; Clindamycin/lincomycin; Hydrocodone; Metronidazole; Sulfa antibiotics; and Oxycodone   History: Past Medical History:  Diagnosis Date  . Allergic rhinitis 06/30/2016  . Allergy   . Carotid atherosclerosis, bilateral 11/06/2017   [place] Nov 2018  . Cyst of breast, left, solitary   . Flank pain   . GERD (gastroesophageal reflux disease)   . Hemorrhoid   . Hyperlipidemia   . IFG (impaired fasting glucose) 07/18/2016   June 2017   . Kidney stones   . Neuropathy 07/24/2016  . Seizures ([place])   . Shingles outbreak 02/03/2017  . Syncope    Recurrent over the last 2 years. had workup at [facility] this year with cardiology and neurology. Had echo and 3-week event monitor that were unremarkable. Has had negative EEG. Echo in 11/11 at [facility] showed no significant abnormalities. Possibly vasovagal  . Trigger thumb    Past Surgical History:  Procedure Laterality Date  . ABDOMINAL HYSTERECTOMY  40s   endometriosis, fibroid tumors; no cancer, partial  . BREAST EXCISIONAL BIOPSY Left 1980'S   EXCISIONAL - NEG  . CHOLECYSTECTOMY  31  . FOOT SURGERY Right    tendon release   Family History  Problem Relation Age of Onset  .  Coronary artery disease Maternal Uncle   . Diabetes Mellitus II Mother   . Arthritis Mother   . Diabetes Maternal Uncle   . Kidney cancer Father 60       died Apr 20, 2017  . Kidney disease Father   . Cancer Father        kidney and bladder  . Arthritis Maternal Uncle   . Cancer Maternal Grandmother        stomach?  . Jaundice Maternal Grandmother   . Stomach cancer Paternal Grandmother   . Cancer Paternal Grandmother        stomach  . Hypertension Sister   . Stroke Maternal Grandfather   . Diabetes Paternal Grandfather   . Diabetes Sister   . Hypertension Sister   . Breast cancer Neg Hx   . Bladder Cancer Neg Hx    Social History    Socioeconomic History  . Marital status: Legally Separated    Spouse name: Not on file  . Number of children: 2  . Years of education: Not on file  . Highest education level: GED or equivalent  Occupational History  . Not on file  Social Needs  . Financial resource strain: Not very hard  . Food insecurity:    Worry: Never true    Inability: Never true  . Transportation needs:    Medical: No    Non-medical: No  Tobacco Use  . Smoking status: Never Smoker  . Smokeless tobacco: Never Used  Substance and Sexual Activity  . Alcohol use: Yes    Alcohol/week: 0.0 standard drinks    Comment: occ  . Drug use: No  . Sexual activity: Yes    Partners: Male  Lifestyle  . Physical activity:    Days per week: 5 days    Minutes per session: 30 min  . Stress: Only a little  Relationships  . Social connections:    Talks on phone: More than three times a week    Gets together: Three times a week    Attends religious service: More than 4 times per year    Active member of club or organization: No    Attends meetings of clubs or organizations: Never    Relationship status: Separated  Other Topics Concern  . Not on file  Social History Narrative   Regular exercise    Tobacco Counseling Counseling given: Not Answered   Clinical Intake:  Pre-visit preparation completed: Yes  Pain : No/denies pain     BMI - recorded: 27.93 Nutritional Status: BMI 25 -29 Overweight Nutritional Risks: None Diabetes: No  How often do you need to have someone help you when you read instructions, pamphlets, or other written materials from your doctor or pharmacy?: 1 - Never What is the last grade level you completed in school?: GED  Interpreter Needed?: No  Information entered by :: Clemetine Marker LPN   Activities of Daily Living In your present state of health, do you have any difficulty performing the following activities: 02/12/2019 09/15/2018  Hearing? N N  Comment declines hearing aids  -  Vision? N N  Comment reading glasses, needs eye exam -  Difficulty concentrating or making decisions? N N  Walking or climbing stairs? N N  Dressing or bathing? N N  Doing errands, shopping? N N  Preparing Food and eating ? N -  Using the Toilet? N -  In the past six months, have you accidently leaked urine? N -  Do you have problems  with loss of bowel control? N -  Managing your Medications? N -  Managing your Finances? N -  Housekeeping or managing your Housekeeping? N -  Some recent data might be hidden     Immunizations and Health Maintenance Immunization History  Administered Date(s) Administered  . Influenza,inj,Quad PF,6+ Mos 09/13/2015, 10/08/2016, 09/26/2017, 09/15/2018  . Influenza-Unspecified 09/13/2015, 10/08/2016, 09/26/2017, 09/15/2018  . Tdap 10/27/2017   Health Maintenance Due  Topic Date Due  . Hepatitis C Screening  [birth year]    Patient Care Team: Lada, Satira Anis, MD as PCP - General (Family Medicine) Mhoon, Larkin Ina, MD as Consulting Physician (Neurology) Sanda Klein Satira Anis, MD as Attending Physician (Family Medicine) Christene Lye, MD (General Surgery)  Indicate any recent Medical Services you may have received from other than [facility] providers in the past year (date may be approximate).     Assessment:   This is a routine wellness examination for Elizabeth Mata.  Hearing/Vision screen Hearing Screening Comments: Pt has no difficulty hearing Vision Screening Comments: Patient past due for eye exam, wears reading glasses. ophthalmology referral today   Dietary issues and exercise activities discussed: Current Exercise Habits: Home exercise routine, Type of exercise: Other - see comments(exercise bike), Time (Minutes): 30, Frequency (Times/Week): 5, Weekly Exercise (Minutes/Week): 150, Intensity: Moderate  Goals    . Patient Stated     Patient states she would like to be more productive over the next year.       Depression Screen PHQ 2/9 Scores  02/12/2019 09/15/2018 04/21/2018 12/02/2017 10/27/2017 09/26/2017 07/11/2017  PHQ - 2 Score 0 0 0 0 0 0 0    Fall Risk Fall Risk  02/12/2019 09/15/2018 04/21/2018 12/02/2017 10/27/2017  Falls in the past year? 1 No No No No  Number falls in past yr: 1 - - - -  Injury with Fall? 0 - - - -  Risk for fall due to : History of fall(s) - - - -  Follow up Falls prevention discussed - - - -   FALL RISK PREVENTION PERTAINING TO THE HOME:  Any stairs in or around the home? Yes  If so, do they handrails? Yes   Home free of loose throw rugs in walkways, pet beds, electrical cords, etc? Yes  Adequate lighting in your home to reduce risk of falls? Yes   ASSISTIVE DEVICES UTILIZED TO PREVENT FALLS:  Life alert? No  Use of a cane, walker or w/c? No  Grab bars in the bathroom? Yes  Shower chair or bench in shower? No  Elevated toilet seat or a handicapped toilet? No   DME ORDERS:  DME order needed?  No   TIMED UP AND GO:  Was the test performed? Yes .  Length of time to ambulate 10 feet: 5 sec.   GAIT:  Appearance of gait: Gait stead-fast and without the use of an assistive device.   Education: Fall risk prevention has been discussed.  Intervention(s) required? No   Cognitive Function:     6CIT Screen 02/12/2019  What Year? 0 points  What month? 0 points  What time? 0 points  Count back from 20 0 points  Months in reverse 0 points  Repeat phrase 0 points  Total Score 0    Screening Tests Health Maintenance  Topic Date Due  . Hepatitis C Screening  07-28-1961  . MAMMOGRAM  09/16/2020  . COLONOSCOPY  01/04/2024  . TETANUS/TDAP  10/28/2027  . INFLUENZA VACCINE  Completed  . HIV Screening  Completed  .  PAP SMEAR-Modifier  Discontinued    Qualifies for Shingles Vaccine? Yes . Due for Shingrix. Education has been provided regarding the importance of this vaccine. Pt has been advised to call insurance company to determine out of pocket expense. Advised may also receive vaccine at  local pharmacy or Health Dept. Verbalized acceptance and understanding.  Tdap: Up to date  Flu Vaccine: Up to date  Pneumococcal Vaccine: due age 19  Cancer Screenings:  Colorectal Screening: Completed 02/21/14. Repeat every 5 years. Referral to GI placed today . Pt aware the office will call re: appt.  Mammogram: Completed 09/16/18. Repeat every year.  Bone Density: due age 84  Lung Cancer Screening: (Low Dose CT Chest recommended if Age 20-80 years, 30 pack-year currently smoking OR have quit w/in 15years.) does not qualify.    Additional Screening:  Hepatitis C Screening: does qualify; postponed  Vision Screening: Recommended annual ophthalmology exams for early detection of glaucoma and other disorders of the eye. Is the patient up to date with their annual eye exam?  No  Who is the provider or what is the name of the office in which the pt attends annual eye exams? No provider If pt is not established with a provider, would they like to be referred to a provider to establish care? Yes . Ophthalmology referral has been placed. Pt aware the office will call re: appt.  Dental Screening: Recommended annual dental exams for proper oral hygiene  Community Resource Referral:  CRR required this visit?  No      Plan:    I have personally reviewed and addressed the Medicare Annual Wellness questionnaire and have noted the following in the patient's chart:  A. Medical and social history B. Use of alcohol, tobacco or illicit drugs  C. Current medications and supplements D. Functional ability and status E.  Nutritional status F.  Physical activity G. Advance directives H. List of other physicians I.  Hospitalizations, surgeries, and ER visits in previous 12 months J.  [place] such as hearing and vision if needed, cognitive and depression L. Referrals and appointments   In addition, I have reviewed and discussed with patient certain preventive protocols, quality  metrics, and best practice recommendations. A written personalized care plan for preventive services as well as general preventive health recommendations were provided to patient.   Signed,  Clemetine Marker, LPN Nurse Health Advisor   Nurse Notes: pt due for repeat colonoscopy and past due for vision exam. Overall doing well and appreciative of visit today.

## 2019-02-12 NOTE — Patient Instructions (Signed)
Elizabeth Mata , Thank you for taking time to come for your Medicare Wellness Visit. I appreciate your ongoing commitment to your health goals. Please review the following plan we discussed and let me know if I can assist you in the future.   Screening recommendations/referrals: Colonoscopy: done 02/21/14. Referral sent to [facility] today for colonoscopy. They will contact you for an appointment.  Mammogram: done 09/16/18 Bone Density: due age 58 Recommended yearly ophthalmology/optometry visit for glaucoma screening and checkup Recommended yearly dental visit for hygiene and checkup  Vaccinations: Influenza vaccine: done 09/15/18 Tdap vaccine: done 10/27/17 Shingles vaccine: Shingrix discussed. Please contact your pharmacy for coverage information.   Advanced directives: Advance directive discussed with you today. I have provided a copy for you to complete at home and have notarized. Once this is complete please bring a copy in to our office so we can scan it into your chart.  Conditions/risks identified: Recommend continuing healthy eating and physical activity.   Next appointment: Please follow up in one year for your Medicare Annual Wellness visit.    Preventive Care 40-64 Years, Female Preventive care refers to lifestyle choices and visits with your health care provider that can promote health and wellness. What does preventive care include?  A yearly physical exam. This is also called an annual well check.  Dental exams once or twice a year.  Routine eye exams. Ask your health care provider how often you should have your eyes checked.  Personal lifestyle choices, including:  Daily care of your teeth and gums.  Regular physical activity.  Eating a healthy diet.  Avoiding tobacco and drug use.  Limiting alcohol use.  Practicing safe sex.  Taking low-dose aspirin daily starting at age 32.  Taking vitamin and mineral supplements as recommended by your health care  provider. What happens during an annual well check? The services and screenings done by your health care provider during your annual well check will depend on your age, overall health, lifestyle risk factors, and family history of disease. Counseling  Your health care provider may ask you questions about your:  Alcohol use.  Tobacco use.  Drug use.  Emotional well-being.  Home and relationship well-being.  Sexual activity.  Eating habits.  Work and work Statistician.  Method of birth control.  Menstrual cycle.  Pregnancy history. Screening  You may have the following tests or measurements:  Height, weight, and BMI.  Blood pressure.  Lipid and cholesterol levels. These may be checked every 5 years, or more frequently if you are over 35 years old.  Skin check.  Lung cancer screening. You may have this screening every year starting at age 77 if you have a 30-pack-year history of smoking and currently smoke or have quit within the past 15 years.  Fecal occult blood test (FOBT) of the stool. You may have this test every year starting at age 25.  Flexible sigmoidoscopy or colonoscopy. You may have a sigmoidoscopy every 5 years or a colonoscopy every 10 years starting at age 13.  Hepatitis C blood test.  Hepatitis B blood test.  Sexually transmitted disease (STD) testing.  Diabetes screening. This is done by checking your blood sugar (glucose) after you have not eaten for a while (fasting). You may have this done every 1-3 years.  Mammogram. This may be done every 1-2 years. Talk to your health care provider about when you should start having regular mammograms. This may depend on whether you have a family history of breast cancer.  BRCA-related cancer screening. This may be done if you have a family history of breast, ovarian, tubal, or peritoneal cancers.  Pelvic exam and Pap test. This may be done every 3 years starting at age 57. Starting at age 58, this may be  done every 5 years if you have a Pap test in combination with an HPV test.  Bone density scan. This is done to screen for osteoporosis. You may have this scan if you are at high risk for osteoporosis. Discuss your test results, treatment options, and if necessary, the need for more tests with your health care provider. Vaccines  Your health care provider may recommend certain vaccines, such as:  Influenza vaccine. This is recommended every year.  Tetanus, diphtheria, and acellular pertussis (Tdap, Td) vaccine. You may need a Td booster every 10 years.  Zoster vaccine. You may need this after age 69.  Pneumococcal 13-valent conjugate (PCV13) vaccine. You may need this if you have certain conditions and were not previously vaccinated.  Pneumococcal polysaccharide (PPSV23) vaccine. You may need one or two doses if you smoke cigarettes or if you have certain conditions. Talk to your health care provider about which screenings and vaccines you need and how often you need them. This information is not intended to replace advice given to you by your health care provider. Make sure you discuss any questions you have with your health care provider. Document Released: 01/12/2016 Document Revised: 09/04/2016 Document Reviewed: 10/17/2015 Elsevier Interactive Patient Education  2017 [place] Prevention in the Home Falls can cause injuries. They can happen to people of all ages. There are many things you can do to make your home safe and to help prevent falls. What can I do on the outside of my home?  Regularly fix the edges of walkways and driveways and fix any cracks.  Remove anything that might make you trip as you walk through a door, such as a raised step or threshold.  Trim any bushes or trees on the path to your home.  Use bright outdoor lighting.  Clear any walking paths of anything that might make someone trip, such as rocks or tools.  Regularly check to see if  handrails are loose or broken. Make sure that both sides of any steps have handrails.  Any raised decks and porches should have guardrails on the edges.  Have any leaves, snow, or ice cleared regularly.  Use sand or salt on walking paths during winter.  Clean up any spills in your garage right away. This includes oil or grease spills. What can I do in the bathroom?  Use night lights.  Install grab bars by the toilet and in the tub and shower. Do not use towel bars as grab bars.  Use non-skid mats or decals in the tub or shower.  If you need to sit down in the shower, use a plastic, non-slip stool.  Keep the floor dry. Clean up any water that spills on the floor as soon as it happens.  Remove soap buildup in the tub or shower regularly.  Attach bath mats securely with double-sided non-slip rug tape.  Do not have throw rugs and other things on the floor that can make you trip. What can I do in the bedroom?  Use night lights.  Make sure that you have a light by your bed that is easy to reach.  Do not use any sheets or blankets that are too big for your bed. They  should not hang down onto the floor.  Have a firm chair that has side arms. You can use this for support while you get dressed.  Do not have throw rugs and other things on the floor that can make you trip. What can I do in the kitchen?  Clean up any spills right away.  Avoid walking on wet floors.  Keep items that you use a lot in easy-to-reach places.  If you need to reach something above you, use a strong step stool that has a grab bar.  Keep electrical cords out of the way.  Do not use floor polish or wax that makes floors slippery. If you must use wax, use non-skid floor wax.  Do not have throw rugs and other things on the floor that can make you trip. What can I do with my stairs?  Do not leave any items on the stairs.  Make sure that there are handrails on both sides of the stairs and use them. Fix  handrails that are broken or loose. Make sure that handrails are as long as the stairways.  Check any carpeting to make sure that it is firmly attached to the stairs. Fix any carpet that is loose or worn.  Avoid having throw rugs at the top or bottom of the stairs. If you do have throw rugs, attach them to the floor with carpet tape.  Make sure that you have a light switch at the top of the stairs and the bottom of the stairs. If you do not have them, ask someone to add them for you. What else can I do to help prevent falls?  Wear shoes that:  Do not have high heels.  Have rubber bottoms.  Are comfortable and fit you well.  Are closed at the toe. Do not wear sandals.  If you use a stepladder:  Make sure that it is fully opened. Do not climb a closed stepladder.  Make sure that both sides of the stepladder are locked into place.  Ask someone to hold it for you, if possible.  Clearly mark and make sure that you can see:  Any grab bars or handrails.  First and last steps.  Where the edge of each step is.  Use tools that help you move around (mobility aids) if they are needed. These include:  Canes.  Walkers.  Scooters.  Crutches.  Turn on the lights when you go into a dark area. Replace any light bulbs as soon as they burn out.  Set up your furniture so you have a clear path. Avoid moving your furniture around.  If any of your floors are uneven, fix them.  If there are any pets around you, be aware of where they are.  Review your medicines with your doctor. Some medicines can make you feel dizzy. This can increase your chance of falling. Ask your doctor what other things that you can do to help prevent falls. This information is not intended to replace advice given to you by your health care provider. Make sure you discuss any questions you have with your health care provider. Document Released: 10/12/2009 Document Revised: 05/23/2016 Document Reviewed:  01/20/2015 Elsevier Interactive Patient Education  2017 [employer].

## 2019-02-15 ENCOUNTER — Other Ambulatory Visit: Payer: Self-pay

## 2019-02-15 ENCOUNTER — Telehealth: Payer: Self-pay | Admitting: Gastroenterology

## 2019-02-15 DIAGNOSIS — Z86010 Personal history of colonic polyps: Secondary | ICD-10-CM

## 2019-02-15 NOTE — Telephone Encounter (Signed)
Patients call has been returned.  Her colonoscopy has been scheduled for 03/19/19 with Dr. Bonna Gains at [facility]  Thanks [employer]

## 2019-02-15 NOTE — Telephone Encounter (Signed)
Pt  ios calling for Sharyn Lull to schedule a procedure

## 2019-03-04 DIAGNOSIS — R002 Palpitations: Secondary | ICD-10-CM | POA: Diagnosis not present

## 2019-03-04 DIAGNOSIS — I6529 Occlusion and stenosis of unspecified carotid artery: Secondary | ICD-10-CM | POA: Diagnosis not present

## 2019-03-04 DIAGNOSIS — G459 Transient cerebral ischemic attack, unspecified: Secondary | ICD-10-CM | POA: Diagnosis not present

## 2019-03-12 ENCOUNTER — Ambulatory Visit (INDEPENDENT_AMBULATORY_CARE_PROVIDER_SITE_OTHER): Payer: Medicare Other | Admitting: Nurse Practitioner

## 2019-03-12 ENCOUNTER — Other Ambulatory Visit: Payer: Self-pay

## 2019-03-12 ENCOUNTER — Encounter: Payer: Self-pay | Admitting: Nurse Practitioner

## 2019-03-12 VITALS — BP 114/72 | HR 77 | Temp 97.9°F | Resp 12 | Ht 59.0 in | Wt 135.3 lb

## 2019-03-12 DIAGNOSIS — L309 Dermatitis, unspecified: Secondary | ICD-10-CM

## 2019-03-12 DIAGNOSIS — I6523 Occlusion and stenosis of bilateral carotid arteries: Secondary | ICD-10-CM | POA: Diagnosis not present

## 2019-03-12 DIAGNOSIS — G459 Transient cerebral ischemic attack, unspecified: Secondary | ICD-10-CM

## 2019-03-12 DIAGNOSIS — Z6827 Body mass index (BMI) 27.0-27.9, adult: Secondary | ICD-10-CM | POA: Diagnosis not present

## 2019-03-12 DIAGNOSIS — R7301 Impaired fasting glucose: Secondary | ICD-10-CM

## 2019-03-12 DIAGNOSIS — Z5181 Encounter for therapeutic drug level monitoring: Secondary | ICD-10-CM | POA: Diagnosis not present

## 2019-03-12 DIAGNOSIS — R29818 Other symptoms and signs involving the nervous system: Secondary | ICD-10-CM | POA: Diagnosis not present

## 2019-03-12 DIAGNOSIS — R569 Unspecified convulsions: Secondary | ICD-10-CM

## 2019-03-12 DIAGNOSIS — J3081 Allergic rhinitis due to animal (cat) (dog) hair and dander: Secondary | ICD-10-CM

## 2019-03-12 DIAGNOSIS — E7800 Pure hypercholesterolemia, unspecified: Secondary | ICD-10-CM | POA: Diagnosis not present

## 2019-03-12 DIAGNOSIS — M79671 Pain in right foot: Secondary | ICD-10-CM

## 2019-03-12 NOTE — Progress Notes (Signed)
Name: Elizabeth Mata   MRN: [id number]    DOB: [birth year]   Date:03/12/2019       Progress Note  Subjective  Chief Complaint  Chief Complaint  Patient presents with  . Follow-up    with labs    HPI  Seizures & Basilar artery migraines & TIA: patient is prescribed topamax 200mg  daily with no missed doses. States last episode over 5 years ago. States sees [facility] [place]; stable. states has occasional headaches but has not had severe migraine pain in a while, does have intermittent dizziness being further evaluated by cards.Takes statin and aspirin   Dizziness & Atherosclerosis: take atorvastatin 80mg  daily and 81mg  aspirin daily without missed doses. Sees cards- last appointment was 2 weeks ago due to dizzy spells, holter monitor did not show any events. Plan for LINQ device placement in the next 2 weeks.   Eczema: takes kenalog cream PRN and uses moisturizing well.   Seasonal allergies: takes allegra daily and flonase PRN, no issues yet.    Overweight: states has been eating a lot more, and not walking as much because of foot pain   Wt Readings from Last 3 Encounters:  03/12/19 135 lb 4.8 oz (61.4 kg)  02/12/19 138 lb 4.8 oz (62.7 kg)  10/27/18 133 lb 6.1 oz (60.5 kg)    Right foot pain: states pain is intermittent on the top of her right food for the past 2 weeks; worse with weight bearing; hasn't tried anything, improved with rubbing. Denies paresthesia. No pain presently.   Patient Active Problem List   Diagnosis Date Noted  . Dizziness 10/27/2018  . Palpitations 10/27/2018  . Urinary urgency 04/21/2018  . Menopausal symptom 04/21/2018  . Urticaria 01/05/2018  . Carotid atherosclerosis, bilateral 11/06/2017  . TIA (transient ischemic attack) 10/28/2017  . Hx of completed stroke 10/28/2017  . Ataxia 10/08/2017  . Breast pain in female 07/11/2017  . Medication monitoring encounter 12/20/2016  . Abnormality of globulin 07/26/2016  . Neuropathy 07/24/2016  .  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 IFG (impaired fasting glucose) 07/18/2016  . Hematuria 07/03/2016  . Allergic rhinitis 06/30/2016  . Chronic left hip pain 06/28/2016  . Breast cancer screening 04/17/2016  . Skin texture changes 12/27/2015  . Vaginal discharge 10/31/2015  . Screening for STD (sexually transmitted disease) 10/31/2015  . Kidney stone on left side 10/24/2015  . Annual physical exam 09/13/2015  . Encounter for screening mammogram for malignant neoplasm of breast 09/13/2015  . Need for immunization against influenza 09/13/2015  . Eczema 09/13/2015  . Irritable bowel syndrome with constipation 09/13/2015  . Seizures ([place]) 08/16/2015  . Carpal tunnel syndrome 07/26/2014  . Carotid artery narrowing 07/05/2013  . Basilar artery migraine 07/06/2012    Past Medical History:  Diagnosis Date  . Allergic rhinitis 06/30/2016  . Allergy   . Carotid atherosclerosis, bilateral 11/06/2017   [place] Nov 2018  . Cyst of breast, left, solitary   . Flank pain   . GERD (gastroesophageal reflux disease)   . Hemorrhoid   . Hyperlipidemia   . IFG (impaired fasting glucose) 07/18/2016   June 2017   . Kidney stones   . Neuropathy 07/24/2016  . Seizures ([place])   . Shingles outbreak 02/03/2017  . Syncope    Recurrent over the last 2 years. had workup at [facility] this year with cardiology and neurology. Had echo and 3-week event monitor that were unremarkable. Has had negative EEG. Echo in 11/11 at [facility] showed no significant abnormalities.  Possibly vasovagal  . Trigger thumb     Past Surgical History:  Procedure Laterality Date  . ABDOMINAL HYSTERECTOMY  40s   endometriosis, fibroid tumors; no cancer, partial  . BREAST EXCISIONAL BIOPSY Left 1980'S   EXCISIONAL - NEG  . CHOLECYSTECTOMY  31  . FOOT SURGERY Right    tendon release    Social History   Tobacco Use  . Smoking status: Never Smoker  . Smokeless tobacco: Never Used  Substance Use Topics  . Alcohol use: Yes     Alcohol/week: 0.0 standard drinks    Comment: occ     Current Outpatient Medications:  .  aspirin EC 81 MG EC tablet, Take 1 tablet (81 mg total) by mouth daily., Disp: 30 tablet, Rfl: 1 .  atorvastatin (LIPITOR) 80 MG tablet, Take 1 tablet (80 mg total) by mouth daily., Disp: 90 tablet, Rfl: 3 .  ciclopirox (LOPROX) 0.77 % cream, APPLY TOPICALLY 2 (TWO) TIMES DAILY. TO AFFECTED SKIN FOR UP TO 4 WEEKS prn, Disp: , Rfl: 0 .  famotidine (PEPCID) 20 MG tablet, Take 1 tablet (20 mg total) by mouth 2 (two) times daily as needed., Disp: 60 tablet, Rfl: 2 .  fexofenadine (ALLEGRA) 180 MG tablet, Take 1 tablet (180 mg total) by mouth daily as needed for allergies or rhinitis., Disp: 90 tablet, Rfl: 3 .  fluticasone (FLONASE) 50 MCG/ACT nasal spray, Place 2 sprays into both nostrils daily. (Patient taking differently: Place 2 sprays into both nostrils daily as needed. ), Disp: 48 g, Rfl: 3 .  topiramate (TOPAMAX) 200 MG tablet, TAKE 1 TABLET (200 MG TOTAL) BY MOUTH 2 (TWO) TIMES DAILY., Disp: , Rfl: 11 .  triamcinolone ointment (KENALOG) 0.1 %, APPLY ON THE SKIN TWICE A DAY AS NEEDED AS NEEDED RASH NOT TO FACE, Disp: , Rfl: 1  Allergies  Allergen Reactions  . Clindamycin Hcl Hives  . Clindamycin/Lincomycin Hives  . Hydrocodone Nausea Only  . Metronidazole Hives  . Sulfa Antibiotics Hives    Break out in hives all over  . Oxycodone Nausea And Vomiting    Review of Systems  Constitutional: Negative for chills, fever and malaise/fatigue.  HENT: Negative for congestion, sinus pain and tinnitus.   Eyes: Negative for blurred vision and double vision.  Respiratory: Negative for cough and shortness of breath.   Cardiovascular: Negative for chest pain and palpitations.  Gastrointestinal: Negative for abdominal pain, diarrhea, nausea and vomiting.  Genitourinary: Negative for hematuria.  Musculoskeletal: Negative for falls, myalgias and neck pain.  Skin: Negative for rash.  Neurological: Positive  for dizziness (intermittent- a few times a week- seeing cards). Negative for tingling, tremors, weakness and headaches.  Psychiatric/Behavioral: Negative for depression. The patient is not nervous/anxious.       No other specific complaints in a complete review of systems (except as listed in HPI above).  Objective  Vitals:   03/12/19 0853  BP: 114/72  Pulse: 77  Resp: 12  Temp: 97.9 F (36.6 C)  TempSrc: Oral  SpO2: 97%  Weight: 135 lb 4.8 oz (61.4 kg)  Height: 4\' 11"  (1.499 m)    Body mass index is 27.33 kg/m.  Nursing Note and Vital Signs reviewed.  Physical Exam Vitals signs reviewed.  Constitutional:      Appearance: She is well-developed.  HENT:     Head: Normocephalic and atraumatic.  Neck:     Musculoskeletal: Normal range of motion and neck supple.     Vascular: No carotid bruit.  Cardiovascular:  Heart sounds: Normal heart sounds.  Pulmonary:     Effort: Pulmonary effort is normal.     Breath sounds: Normal breath sounds.  Abdominal:     General: Bowel sounds are normal.     Palpations: Abdomen is soft.     Tenderness: There is no abdominal tenderness.  Musculoskeletal: Normal range of motion.     Right ankle: Normal. She exhibits normal range of motion, no swelling and no deformity.       Feet:  Skin:    General: Skin is warm and dry.     Capillary Refill: Capillary refill takes less than 2 seconds.  Neurological:     Mental Status: She is alert and oriented to person, place, and time.     GCS: GCS eye subscore is 4. GCS verbal subscore is 5. GCS motor subscore is 6.     Sensory: No sensory deficit.  Psychiatric:        Speech: Speech normal.        Behavior: Behavior normal.        Thought Content: Thought content normal.        Judgment: Judgment normal.        No results found for this or any previous visit (from the past 48 hour(s)).  Assessment & Plan 1. Seizures ([place]) Stable continue medications, follow up with neuro   2.  TIA (transient ischemic attack) Stable continue medications, follow up with neuro; ASA and statin   3. Pure hypercholesterolemia Stable continue statin  - Lipid Profile  4. Bilateral carotid artery stenosis Stable continue statin, follow up with cards  - Lipid Profile  5. Allergic rhinitis due to animal hair and dander Continue allegra and prn flonase   6. IFG (impaired fasting glucose) Discussed diet  - COMPLETE METABOLIC PANEL WITH GFR  7. Eczema, unspecified type Well controlled, prn kenalog   8. Medication monitoring encounter - COMPLETE METABOLIC PANEL WITH GFR  9. BMI 27.0-27.9,adult Plans to be more active and improve diet   10. Right foot pain Ice/heat, tylenol PRN if not improve in 1 week will get xray, consider podiatry vs ortho referral - DG Foot Complete Right; Future

## 2019-03-12 NOTE — Patient Instructions (Addendum)
If you have not heard anything from my staff in a week about any labs from today, please contact us here to follow-up [phone]  Bad cholesterol, also called low-density lipoprotein (LDL), carries cholesterol and other fats that your liver makes to your body tissue. If it builds up in blood vessels, LDL can cause heart disease and other health problems. Your LDL level should be below 100. If you have diabetes or a possible heart problem, your LDL should be below 70.  Eat: Eat 20 to 30 grams of soluble fiber every day. Foods such as fruits and vegetables, whole grains, beans, peas, nuts, and seeds can help lower LDL. Avoid: Saturated fats (Dairy foods - such as butter, cream, ghee, regular-fat milk and cheese. Meat - such as fatty cuts of beef, pork and lamb, processed meats like salami, sausages and the skin on chicken. Lard., fatty snack foods, cakes, biscuits, pies and deep fried foods) Avoid smoking  If your foot pain is not improved with ice/heat, rest and acetaminophen please let us know and get xray of foot completed and then we can decide if we need to send you to a specialist.

## 2019-03-13 LAB — LIPID PANEL
Cholesterol: 188 mg/dL (ref <200)
HDL: 73 mg/dL (ref >=50)
LDL Cholesterol (Calc): 101 mg/dL (calc) — ABNORMAL HIGH
Non-HDL Cholesterol (Calc): 115 mg/dL (calc) (ref <130)
Total CHOL/HDL Ratio: 2.6 (calc) (ref <5.0)
Triglycerides: 62 mg/dL (ref <150)

## 2019-03-13 LAB — COMPLETE METABOLIC PANEL WITH GFR
AG Ratio: 2 (calc) (ref 1.0–2.5)
ALT: 27 U/L (ref 6–29)
AST: 26 U/L (ref 10–35)
Albumin: 4.4 g/dL (ref 3.6–5.1)
Alkaline phosphatase (APISO): 79 U/L (ref 37–153)
BUN: 14 mg/dL (ref 7–25)
CO2: 20 mmol/L (ref 20–32)
Calcium: 9.7 mg/dL (ref 8.6–10.4)
Chloride: 113 mmol/L — ABNORMAL HIGH (ref 98–110)
Creat: 0.87 mg/dL (ref 0.50–1.05)
GFR, Est African American: 86 mL/min/{1.73_m2} (ref >=60)
GFR, Est Non African American: 74 mL/min/{1.73_m2} (ref >=60)
Globulin: 2.2 g/dL (calc) (ref 1.9–3.7)
Glucose, Bld: 91 mg/dL (ref 65–99)
Potassium: 3.9 mmol/L (ref 3.5–5.3)
Sodium: 143 mmol/L (ref 135–146)
Total Bilirubin: 0.6 mg/dL (ref 0.2–1.2)
Total Protein: 6.6 g/dL (ref 6.1–8.1)

## 2019-03-19 ENCOUNTER — Ambulatory Visit: Admission: RE | Admit: 2019-03-19 | Payer: Medicare Other | Source: Home / Self Care | Admitting: Gastroenterology

## 2019-03-19 ENCOUNTER — Encounter: Admission: RE | Payer: Self-pay | Source: Home / Self Care

## 2019-03-19 SURGERY — COLONOSCOPY WITH PROPOFOL
Anesthesia: General

## 2019-03-23 ENCOUNTER — Encounter: Admission: RE | Payer: Self-pay | Source: Home / Self Care

## 2019-03-23 ENCOUNTER — Ambulatory Visit: Admission: RE | Admit: 2019-03-23 | Payer: Medicare Other | Source: Home / Self Care | Admitting: Cardiology

## 2019-03-23 SURGERY — LOOP RECORDER INSERTION
Anesthesia: LOCAL

## 2019-05-17 ENCOUNTER — Encounter: Payer: Self-pay | Admitting: Nurse Practitioner

## 2019-05-17 ENCOUNTER — Ambulatory Visit (INDEPENDENT_AMBULATORY_CARE_PROVIDER_SITE_OTHER): Payer: Medicare Other | Admitting: Nurse Practitioner

## 2019-05-17 DIAGNOSIS — R1013 Epigastric pain: Secondary | ICD-10-CM | POA: Diagnosis not present

## 2019-05-17 NOTE — Progress Notes (Signed)
Virtual Visit via Video Note  I connected with Elizabeth Mata on 05/17/19 at  3:00 PM EDT by a video enabled telemedicine application and verified that I am speaking with the correct person using two identifiers.   Staff discussed the limitations of evaluation and management by telemedicine and the availability of in person appointments. The patient expressed understanding and agreed to proceed.  Started his virtual visit using [employer] and switched over to telephone visit due to connectivity issues.  Patient location: home  My location: work office Other people present:  none HPI  Patient endorses upper abdominal pain ongoing for 2 months and decreased appetite. States sometimes pain starts right as she is about to eat and worsens while she is eating.  Endorses gnawing pain in upper abdomen with food. Patient endorses mild intermittent nausea initially that has worsened and makes her feel like she needs to vomit but she has not vomited or had dry heaves. States pain has progressively worsened and sometimes it is happening at night time.  She takes a daily aspirin, no other NSAIDs.  Patient has history of TIA. Denies vomiting, diarrhea, constipation, fevers, stress, changes in stool color.   The 10-year ASCVD risk score Elizabeth Mata., et al., 2013) is: 1.8%   Values used to calculate the score:     Age: 58 years     Sex: Female     Is Non-Hispanic African American: Yes     Diabetic: No     Tobacco smoker: No     Systolic Blood Pressure: 182 mmHg     Is BP treated: No     HDL Cholesterol: 73 mg/dL     Total Cholesterol: 188 mg/dL    PHQ2/9: Depression screen [facility] 2/9 05/17/2019 03/12/2019 02/12/2019 09/15/2018 04/21/2018  Decreased Interest 0 0 0 0 0  Down, Depressed, Hopeless 0 0 0 0 0  PHQ - 2 Score 0 0 0 0 0  Altered sleeping 0 0 - - -  Tired, decreased energy 0 0 - - -  Change in appetite 0 0 - - -  Feeling bad or failure about yourself  0 0 - - -  Trouble concentrating 0 0 - - -   Moving slowly or fidgety/restless 0 0 - - -  Suicidal thoughts 0 0 - - -  PHQ-9 Score 0 0 - - -  Difficult doing work/chores Not difficult at all Not difficult at all - - -     PHQ reviewed. Negative  Patient Active Problem List   Diagnosis Date Noted  . Dizziness 10/27/2018  . Palpitations 10/27/2018  . Urinary urgency 04/21/2018  . Menopausal symptom 04/21/2018  . Urticaria 01/05/2018  . Carotid atherosclerosis, bilateral 11/06/2017  . TIA (transient ischemic attack) 10/28/2017  . Hx of completed stroke 10/28/2017  . Ataxia 10/08/2017  . Breast pain in female 07/11/2017  . Medication monitoring encounter 12/20/2016  . Abnormality of globulin 07/26/2016  . Neuropathy 07/24/2016  . 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 IFG (impaired fasting glucose) 07/18/2016  . Hematuria 07/03/2016  . Allergic rhinitis 06/30/2016  . Chronic left hip pain 06/28/2016  . Breast cancer screening 04/17/2016  . Skin texture changes 12/27/2015  . Vaginal discharge 10/31/2015  . Screening for STD (sexually transmitted disease) 10/31/2015  . Kidney stone on left side 10/24/2015  . Annual physical exam 09/13/2015  . Encounter for screening mammogram for malignant neoplasm of breast 09/13/2015  . Need for immunization against influenza 09/13/2015  . Eczema 09/13/2015  .  Irritable bowel syndrome with constipation 09/13/2015  . Seizures ([place]) 08/16/2015  . Carpal tunnel syndrome 07/26/2014  . Carotid artery narrowing 07/05/2013  . Basilar artery migraine 07/06/2012    Past Medical History:  Diagnosis Date  . Allergic rhinitis 06/30/2016  . Allergy   . Carotid atherosclerosis, bilateral 11/06/2017   [place] Nov 2018  . Cyst of breast, left, solitary   . Flank pain   . GERD (gastroesophageal reflux disease)   . Hemorrhoid   . Hyperlipidemia   . IFG (impaired fasting glucose) 07/18/2016   June 2017   . Kidney stones   . Neuropathy 07/24/2016  . Seizures ([place])   . Shingles outbreak 02/03/2017   . Syncope    Recurrent over the last 2 years. had workup at [facility] this year with cardiology and neurology. Had echo and 3-week event monitor that were unremarkable. Has had negative EEG. Echo in 11/11 at [facility] showed no significant abnormalities. Possibly vasovagal  . Trigger thumb     Past Surgical History:  Procedure Laterality Date  . ABDOMINAL HYSTERECTOMY  40s   endometriosis, fibroid tumors; no cancer, partial  . BREAST EXCISIONAL BIOPSY Left 1980'S   EXCISIONAL - NEG  . CHOLECYSTECTOMY  31  . FOOT SURGERY Right    tendon release    Social History   Tobacco Use  . Smoking status: Never Smoker  . Smokeless tobacco: Never Used  Substance Use Topics  . Alcohol use: Yes    Alcohol/week: 0.0 standard drinks    Comment: occ     Current Outpatient Medications:  .  aspirin EC 81 MG EC tablet, Take 1 tablet (81 mg total) by mouth daily., Disp: 30 tablet, Rfl: 1 .  atorvastatin (LIPITOR) 80 MG tablet, Take 1 tablet (80 mg total) by mouth daily., Disp: 90 tablet, Rfl: 3 .  fluticasone (FLONASE) 50 MCG/ACT nasal spray, SPRAY 2 SPRAYS INTO EACH NOSTRIL EVERY DAY, Disp: , Rfl:  .  topiramate (TOPAMAX) 200 MG tablet, Take 200 mg by mouth 2 (two) times daily. , Disp: , Rfl: 11  Allergies  Allergen Reactions  . Clindamycin/Lincomycin Hives  . Hydrocodone Nausea Only  . Metronidazole Hives  . Sulfa Antibiotics Hives    Break out in hives all over  . Oxycodone Nausea And Vomiting    ROS   No other specific complaints in a complete review of systems (except as listed in HPI above).  Objective  There were no vitals filed for this visit.   There is no height or weight on file to calculate BMI.  Nursing Note and Vital Signs reviewed.  Physical Exam   Constitutional: Patient appears well-developed and well-nourished. No distress.  HENT: Head: Normocephalic and atraumatic. Pulmonary/Chest: Effort normal  Musculoskeletal: Normal range of motion,  Neurological: alert and  oriented, speech normal.  Abdominal: mild epigastric and left upper quadrant tenderness to self palpation Skin: No rash noted. No erythema.  Psychiatric: Patient has a normal mood and affect. behavior is normal. Judgment and thought content normal.    Assessment & Plan  1. Epigastric abdominal pain Discussed PUD and possible H. pylori infection will come in for testing tomorrow labs only.  Start on PPI discussed red flag symptoms, follow-up in 2 weeks - CBC w/Diff/Platelet - COMPLETE METABOLIC PANEL WITH GFR - H. pylori breath test    Follow Up Instructions:   Follow-up in 2 weeks I discussed the assessment and treatment plan with the patient. The patient was provided an opportunity to ask questions and  all were answered. The patient agreed with the plan and demonstrated an understanding of the instructions.   The patient was advised to call back or seek an in-person evaluation if the symptoms worsen or if the condition fails to improve as anticipated.  I provided 26 minutes of non-face-to-face time during this encounter.   Fredderick Severance, NP

## 2019-05-18 DIAGNOSIS — R1013 Epigastric pain: Secondary | ICD-10-CM | POA: Diagnosis not present

## 2019-05-19 ENCOUNTER — Other Ambulatory Visit: Payer: Self-pay | Admitting: Nurse Practitioner

## 2019-05-19 LAB — COMPLETE METABOLIC PANEL WITH GFR
AG Ratio: 1.9 (calc) (ref 1.0–2.5)
ALT: 40 U/L — ABNORMAL HIGH (ref 6–29)
AST: 38 U/L — ABNORMAL HIGH (ref 10–35)
Albumin: 4.4 g/dL (ref 3.6–5.1)
Alkaline phosphatase (APISO): 89 U/L (ref 37–153)
BUN: 11 mg/dL (ref 7–25)
CO2: 20 mmol/L (ref 20–32)
Calcium: 9.9 mg/dL (ref 8.6–10.4)
Chloride: 111 mmol/L — ABNORMAL HIGH (ref 98–110)
Creat: 0.92 mg/dL (ref 0.50–1.05)
GFR, Est African American: 81 mL/min/{1.73_m2} (ref >=60)
GFR, Est Non African American: 70 mL/min/{1.73_m2} (ref >=60)
Globulin: 2.3 g/dL (calc) (ref 1.9–3.7)
Glucose, Bld: 90 mg/dL (ref 65–99)
Potassium: 3.7 mmol/L (ref 3.5–5.3)
Sodium: 141 mmol/L (ref 135–146)
Total Bilirubin: 0.7 mg/dL (ref 0.2–1.2)
Total Protein: 6.7 g/dL (ref 6.1–8.1)

## 2019-05-19 LAB — CBC WITH DIFFERENTIAL/PLATELET
Absolute Monocytes: 248 cells/uL (ref 200–950)
Basophils Absolute: 20 cells/uL (ref 0–200)
Basophils Relative: 0.5 %
Eosinophils Absolute: 28 cells/uL (ref 15–500)
Eosinophils Relative: 0.7 %
HCT: 40.6 % (ref 35.0–45.0)
Hemoglobin: 14 g/dL (ref 11.7–15.5)
Lymphs Abs: 2440 cells/uL (ref 850–3900)
MCH: 32.2 pg (ref 27.0–33.0)
MCHC: 34.5 g/dL (ref 32.0–36.0)
MCV: 93.3 fL (ref 80.0–100.0)
MPV: 9.1 fL (ref 7.5–12.5)
Monocytes Relative: 6.2 %
Neutro Abs: 1264 cells/uL — ABNORMAL LOW (ref 1500–7800)
Neutrophils Relative %: 31.6 %
Platelets: 309 10*3/uL (ref 140–400)
RBC: 4.35 10*6/uL (ref 3.80–5.10)
RDW: 12.7 % (ref 11.0–15.0)
Total Lymphocyte: 61 %
WBC: 4 10*3/uL (ref 3.8–10.8)

## 2019-05-19 LAB — H. PYLORI BREATH TEST: H. pylori Breath Test: NOT DETECTED

## 2019-05-19 MED ORDER — PANTOPRAZOLE SODIUM 40 MG PO TBEC: 40.0000 mg | DELAYED_RELEASE_TABLET | Freq: Every day | ORAL | 3 refills | Status: DC

## 2019-06-01 ENCOUNTER — Other Ambulatory Visit: Payer: Self-pay

## 2019-06-01 ENCOUNTER — Encounter: Payer: Self-pay | Admitting: Nurse Practitioner

## 2019-06-01 ENCOUNTER — Other Ambulatory Visit: Payer: Self-pay | Admitting: Nurse Practitioner

## 2019-06-01 ENCOUNTER — Ambulatory Visit (INDEPENDENT_AMBULATORY_CARE_PROVIDER_SITE_OTHER): Payer: Medicare Other | Admitting: Nurse Practitioner

## 2019-06-01 VITALS — BP 116/70 | HR 80 | Temp 98.0°F | Resp 14 | Ht 59.0 in | Wt 133.6 lb

## 2019-06-01 DIAGNOSIS — R748 Abnormal levels of other serum enzymes: Secondary | ICD-10-CM

## 2019-06-01 DIAGNOSIS — R110 Nausea: Secondary | ICD-10-CM

## 2019-06-01 DIAGNOSIS — R10811 Right upper quadrant abdominal tenderness: Secondary | ICD-10-CM | POA: Diagnosis not present

## 2019-06-01 DIAGNOSIS — R1013 Epigastric pain: Secondary | ICD-10-CM | POA: Diagnosis not present

## 2019-06-01 NOTE — Patient Instructions (Addendum)
- Stop protonix for 7 days, if diarrhea does not improve we can restart this.  - We are ordering an ultrasound to take a look at your liver, we will work on getting this scheduled within the month - We have sent a referral to GI you should hear back within the month - We will check for liver infections today- and will call you back within a week for results.   Bland Diet A bland diet consists of foods that are often soft and do not have a lot of fat, fiber, or extra seasonings. Foods without fat, fiber, or seasoning are easier for the body to digest. They are also less likely to irritate your mouth, throat, stomach, and other parts of your digestive system. A bland diet is sometimes called a BRAT diet. What is my plan? Your health care provider or food and nutrition specialist (dietitian) may recommend specific changes to your diet to prevent symptoms or to treat your symptoms. These changes may include:  Eating small meals often.  Cooking food until it is soft enough to chew easily.  Chewing your food well.  Drinking fluids slowly.  Not eating foods that are very spicy, sour, or fatty.  Not eating citrus fruits, such as oranges and grapefruit. What do I need to know about this diet?  Eat a variety of foods from the bland diet food list.  Do not follow a bland diet longer than needed.  Ask your health care provider whether you should take vitamins or supplements. What foods can I eat? Grains  Hot cereals, such as cream of wheat. Rice. Bread, crackers, or tortillas made from refined white flour. Vegetables Canned or cooked vegetables. Mashed or boiled potatoes. Fruits  Bananas. Applesauce. Other types of cooked or canned fruit with the skin and seeds removed, such as canned peaches or pears. Meats and other proteins  Scrambled eggs. Creamy peanut butter or other nut butters. Lean, well-cooked meats, such as chicken or fish. Tofu. Soups or broths. Dairy Low-fat dairy products,  such as milk, cottage cheese, or yogurt. Beverages  Water. Herbal tea. Apple juice. Fats and oils Mild salad dressings. Canola or olive oil. Sweets and desserts Pudding. Custard. Fruit gelatin. Ice cream. The items listed above may not be a complete list of recommended foods and beverages. Contact a dietitian for more options. What foods are not recommended? Grains Whole grain breads and cereals. Vegetables Raw vegetables. Fruits Raw fruits, especially citrus, berries, or dried fruits. Dairy Whole fat dairy foods. Beverages Caffeinated drinks. Alcohol. Seasonings and condiments Strongly flavored seasonings or condiments. Hot sauce. Salsa. Other foods Spicy foods. Fried foods. Sour foods, such as pickled or fermented foods. Foods with high sugar content. Foods high in fiber. The items listed above may not be a complete list of foods and beverages to avoid. Contact a dietitian for more information. Summary  A bland diet consists of foods that are often soft and do not have a lot of fat, fiber, or extra seasonings.  Foods without fat, fiber, or seasoning are easier for the body to digest.  Check with your health care provider to see how long you should follow this diet plan. It is not meant to be followed for long periods. This information is not intended to replace advice given to you by your health care provider. Make sure you discuss any questions you have with your health care provider. Document Released: 04/08/2016 Document Revised: 01/14/2018 Document Reviewed: 01/14/2018 Elsevier Interactive Patient Education  2019 [employer]  

## 2019-06-01 NOTE — Progress Notes (Signed)
Name: Elizabeth Mata   MRN: [id number]    DOB: 12-Mar-1961   Date:06/01/2019       Progress Note  Subjective  Chief Complaint  Chief Complaint  Patient presents with  . Follow-up    HPI  Patient endorses intermittent epigastric pain that has been going on for months that has progressively worsened. H.pylori test was negative, Protonix has not improved it at all. States pain is typically worse when she eats but can happen even unrelated to food. The only thing she can ingest that does not cause worsening pain is water. Has been eating low-fat diet without any relief of symptoms. Patient states pain has progressed and now she is getting nauseated. Denies vomiting. States has been having bristol scale 6-7 since taking protonix. States has not been taking any acetaminophen. 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  Lab Results  Component Value Date   ALT 40 (H) 05/18/2019   AST 38 (H) 05/18/2019   ALKPHOS 87 01/23/2018   BILITOT 0.7 05/18/2019    PHQ2/9: Depression screen [facility] 2/9 06/01/2019 05/17/2019 03/12/2019 02/12/2019 09/15/2018  Decreased Interest 0 0 0 0 0  Down, Depressed, Hopeless 0 0 0 0 0  PHQ - 2 Score 0 0 0 0 0  Altered sleeping 0 0 0 - -  Tired, decreased energy 0 0 0 - -  Change in appetite 0 0 0 - -  Feeling bad or failure about yourself  0 0 0 - -  Trouble concentrating 0 0 0 - -  Moving slowly or fidgety/restless 0 0 0 - -  Suicidal thoughts 0 0 0 - -  PHQ-9 Score 0 0 0 - -  Difficult doing work/chores Not difficult at all Not difficult at all Not difficult at all - -     PHQ reviewed. Negative  Patient Active Problem List   Diagnosis Date Noted  . Dizziness 10/27/2018  . Palpitations 10/27/2018  . Urinary urgency 04/21/2018  . Menopausal symptom 04/21/2018  . Urticaria 01/05/2018  . Carotid atherosclerosis, bilateral 11/06/2017  . TIA (transient ischemic attack) 10/28/2017  . Hx of completed stroke 10/28/2017  . Ataxia 10/08/2017  . Breast  pain in female 07/11/2017  . Medication monitoring encounter 12/20/2016  . Abnormality of globulin 07/26/2016  . Neuropathy 07/24/2016  . 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 IFG (impaired fasting glucose) 07/18/2016  . Hematuria 07/03/2016  . Allergic rhinitis 06/30/2016  . Chronic left hip pain 06/28/2016  . Breast cancer screening 04/17/2016  . Skin texture changes 12/27/2015  . Vaginal discharge 10/31/2015  . Screening for STD (sexually transmitted disease) 10/31/2015  . Kidney stone on left side 10/24/2015  . Annual physical exam 09/13/2015  . Encounter for screening mammogram for malignant neoplasm of breast 09/13/2015  . Need for immunization against influenza 09/13/2015  . Eczema 09/13/2015  . Irritable bowel syndrome with constipation 09/13/2015  . Seizures ([place]) 08/16/2015  . Carpal tunnel syndrome 07/26/2014  . Carotid artery narrowing 07/05/2013  . Basilar artery migraine 07/06/2012    Past Medical History:  Diagnosis Date  . Allergic rhinitis 06/30/2016  . Allergy   . Carotid atherosclerosis, bilateral 11/06/2017   [place] Nov 2018  . Cyst of breast, left, solitary   . Flank pain   . GERD (gastroesophageal reflux disease)   . Hemorrhoid   . Hyperlipidemia   . IFG (impaired fasting glucose) 07/18/2016   June 2017   . Kidney stones   . Neuropathy 07/24/2016  . Seizures (  [place])   . Shingles outbreak 02/03/2017  . Syncope    Recurrent over the last 2 years. had workup at [facility] this year with cardiology and neurology. Had echo and 3-week event monitor that were unremarkable. Has had negative EEG. Echo in 11/11 at [facility] showed no significant abnormalities. Possibly vasovagal  . Trigger thumb     Past Surgical History:  Procedure Laterality Date  . ABDOMINAL HYSTERECTOMY  40s   endometriosis, fibroid tumors; no cancer, partial  . BREAST EXCISIONAL BIOPSY Left 1980'S   EXCISIONAL - NEG  . CHOLECYSTECTOMY  31  . FOOT SURGERY Right    tendon release    Social History    Tobacco Use  . Smoking status: Never Smoker  . Smokeless tobacco: Never Used  Substance Use Topics  . Alcohol use: Yes    Alcohol/week: 0.0 standard drinks    Comment: occ     Current Outpatient Medications:  .  aspirin EC 81 MG EC tablet, Take 1 tablet (81 mg total) by mouth daily., Disp: 30 tablet, Rfl: 1 .  atorvastatin (LIPITOR) 80 MG tablet, Take 1 tablet (80 mg total) by mouth daily., Disp: 90 tablet, Rfl: 3 .  fluticasone (FLONASE) 50 MCG/ACT nasal spray, SPRAY 2 SPRAYS INTO EACH NOSTRIL EVERY DAY, Disp: , Rfl:  .  pantoprazole (PROTONIX) 40 MG tablet, Take 1 tablet (40 mg total) by mouth daily., Disp: 30 tablet, Rfl: 3 .  topiramate (TOPAMAX) 200 MG tablet, Take 200 mg by mouth 2 (two) times daily. , Disp: , Rfl: 11  Allergies  Allergen Reactions  . Clindamycin/Lincomycin Hives  . Hydrocodone Nausea Only  . Metronidazole Hives  . Sulfa Antibiotics Hives    Break out in hives all over  . Oxycodone Nausea And Vomiting    ROS    No other specific complaints in a complete review of systems (except as listed in HPI above).  Objective  Vitals:   06/01/19 1538  BP: 116/70  Pulse: 80  Resp: 14  Temp: 98 F (36.7 C)  TempSrc: Oral  SpO2: 99%  Weight: 133 lb 9.6 oz (60.6 kg)  Height: 4\' 11"  (1.499 m)     Body mass index is 26.98 kg/m.  Nursing Note and Vital Signs reviewed.  Physical Exam Constitutional:      Appearance: Normal appearance.  HENT:     Head: Normocephalic and atraumatic.  Cardiovascular:     Rate and Rhythm: Normal rate.  Pulmonary:     Effort: Pulmonary effort is normal.  Abdominal:     Tenderness: There is abdominal tenderness. There is no right CVA tenderness, left CVA tenderness or guarding.  Skin:    General: Skin is warm and dry.  Neurological:     General: No focal deficit present.     Mental Status: She is alert and oriented to person, place, and time.  Psychiatric:        Mood and Affect: Mood normal.        Behavior:  Behavior normal.        No results found for this or any previous visit (from the past 48 hour(s)).  Assessment & Plan  1. Epigastric abdominal pain - Ambulatory referral to Gastroenterology - US Abdomen Limited RUQ; Future  2. Nausea - Ambulatory referral to Gastroenterology - US Abdomen Limited RUQ; Future - Hepatitis panel, acute  3. Right upper quadrant abdominal tenderness, rebound tenderness presence not specified - Ambulatory referral to Gastroenterology - US Abdomen Limited RUQ; Future - Hepatitis panel,  acute  4. Elevated liver enzymes - Ambulatory referral to Gastroenterology - US Abdomen Limited RUQ; Future - Hepatitis panel, acute

## 2019-06-02 LAB — HEPATITIS PANEL, ACUTE
Hep A IgM: NONREACTIVE
Hep B C IgM: NONREACTIVE
Hepatitis B Surface Ag: NONREACTIVE
Hepatitis C Ab: NONREACTIVE
SIGNAL TO CUT-OFF: 0.01 (ref <1.00)

## 2019-06-09 ENCOUNTER — Ambulatory Visit (INDEPENDENT_AMBULATORY_CARE_PROVIDER_SITE_OTHER): Payer: Medicare Other | Admitting: Gastroenterology

## 2019-06-09 DIAGNOSIS — R1013 Epigastric pain: Secondary | ICD-10-CM

## 2019-06-09 DIAGNOSIS — R748 Abnormal levels of other serum enzymes: Secondary | ICD-10-CM

## 2019-06-09 MED ORDER — PANTOPRAZOLE SODIUM 20 MG PO TBEC: 20.0000 mg | DELAYED_RELEASE_TABLET | Freq: Every day | ORAL | 0 refills | Status: DC

## 2019-06-09 NOTE — Progress Notes (Signed)
Elizabeth Antigua Leonore  [place], [place] [address]  Main: [phone]  Fax: [phone]   Gastroenterology Consultation  Referring Provider:     Fredderick Severance, NP Primary Care Physician:  Arnetha Courser, MD Reason for Consultation:   Elevated liver enzymes        HPI:   Virtual Visit via Video Note  I connected with patient on 06/09/19 at  2:30 PM EDT by video ([email or web address]) and verified that I am speaking with the correct person using two identifiers.   I discussed the limitations, risks, security and privacy concerns of performing an evaluation and management service by video and the availability of in person appointments. I also discussed with the patient that there may be a patient responsible charge related to this service. The patient expressed understanding and agreed to proceed.  Location of the patient: Home Location of provider: Home Participating persons: Patient and provider only (Nursing staff checked in patient via phone but were not physically involved in the video interaction - see their notes)   History of Present Illness: Chief Complaint  Patient presents with  . New Patient (Initial Visit)    Elevated LFTs  . Abdominal Pain    RUQ  . Nausea    Elizabeth Mata is a 58 y.o. y/o female referred for consultation & management  by Dr. Sanda Klein, Satira Anis, MD.  Patient reports 54-month history of midepigastric abdominal pain that was initially occurring after meals, and now is occurring even without meals.  Reports associated nausea but no vomiting.  Reports a subjective weight loss over this time period.  Denies any blood in stool.  However, feels after eating, sometimes feels an urgency to have a bowel movement and it is sometimes loose.  Also reports abdominal bloating.  H. pylori breath test on May 19 was negative.  Mildly elevated AST and ALT were noted on May 19 labs and acute hepatitis panel was negative.  Patient has had a  history of cholecystectomy years ago.  Right upper quadrant ultrasound has been ordered by PCP and is pending.  Last colon[facility] in 2015 [facility] with 3 subcentimeter polyps removed and repeat recommended in 5 years.  1 of the polyps was tubular adenoma.  The other were benign.  Reports history of an upper endoscopy several years ago and is unaware of the results.  Denies any dysphagia.  Past Medical History:  Diagnosis Date  . Allergic rhinitis 06/30/2016  . Allergy   . Carotid atherosclerosis, bilateral 11/06/2017   [place] Nov 2018  . Cyst of breast, left, solitary   . Flank pain   . GERD (gastroesophageal reflux disease)   . Hemorrhoid   . Hyperlipidemia   . IFG (impaired fasting glucose) 07/18/2016   June 2017   . Kidney stones   . Neuropathy 07/24/2016  . Seizures ([place])   . Shingles outbreak 02/03/2017  . Syncope    Recurrent over the last 2 years. had workup at [facility] this year with cardiology and neurology. Had echo and 3-week event monitor that were unremarkable. Has had negative EEG. Echo in 11/11 at [facility] showed no significant abnormalities. Possibly vasovagal  . Trigger thumb     Past Surgical History:  Procedure Laterality Date  . ABDOMINAL HYSTERECTOMY  40s   endometriosis, fibroid tumors; no cancer, partial  . BREAST EXCISIONAL BIOPSY Left 1980'S   EXCISIONAL - NEG  . CHOLECYSTECTOMY  31  . FOOT SURGERY Right  tendon release    Prior to Admission medications   Medication Sig Start Date End Date Taking? Authorizing Provider  aspirin EC 81 MG EC tablet Take 1 tablet (81 mg total) by mouth daily. 10/10/17  Yes Henreitta Leber, MD  atorvastatin (LIPITOR) 80 MG tablet Take 1 tablet (80 mg total) by mouth daily. 09/17/18  Yes Lada, Satira Anis, MD  fluticasone (FLONASE) 50 MCG/ACT nasal spray SPRAY 2 SPRAYS INTO EACH NOSTRIL EVERY DAY 04/26/19  Yes [provider]  topiramate (TOPAMAX) 200 MG tablet Take 200 mg by mouth 2 (two) times daily.  01/23/17  Yes [provider]  pantoprazole (PROTONIX) 20 MG tablet Take 1 tablet (20 mg total) by mouth daily for 30 days. 06/09/19 07/09/19  Virgel Manifold, MD    Family History  Problem Relation Age of Onset  . Coronary artery disease Maternal Uncle   . Diabetes Mellitus II Mother   . Arthritis Mother   . Diabetes Maternal Uncle   . Kidney cancer Father 56       died 05-10-17  . Kidney disease Father   . Cancer Father        kidney and bladder  . Arthritis Maternal Uncle   . Cancer Maternal Grandmother        stomach?  . Jaundice Maternal Grandmother   . Stomach cancer Paternal Grandmother   . Cancer Paternal Grandmother        stomach  . Hypertension Sister   . Stroke Maternal Grandfather   . Diabetes Paternal Grandfather   . Diabetes Sister   . Hypertension Sister   . Breast cancer Neg Hx   . Bladder Cancer Neg Hx      Social History   Tobacco Use  . Smoking status: Never Smoker  . Smokeless tobacco: Never Used  Substance Use Topics  . Alcohol use: Yes    Alcohol/week: 0.0 standard drinks    Comment: occ  . Drug use: No    Allergies as of 06/09/2019 - Review Complete 06/09/2019  Allergen Reaction Noted  . Clindamycin/lincomycin Hives 11/10/2015  . Hydrocodone Nausea Only 03/01/2015  . Metronidazole Hives 11/07/2010  . Sulfa antibiotics Hives 07/05/2014  . Oxycodone Nausea And Vomiting 02/05/2017    Review of Systems:    All systems reviewed and negative except where noted in HPI.   Observations/Objective:  Labs: CBC    Component Value Date/Time   WBC 4.0 05/18/2019 1137   RBC 4.35 05/18/2019 1137   HGB 14.0 05/18/2019 1137   HGB 14.3 10/24/2015 1645   HCT 40.6 05/18/2019 1137   HCT 42.5 10/24/2015 1645   PLT 309 05/18/2019 1137   PLT 375 10/24/2015 1645   MCV 93.3 05/18/2019 1137   MCV 92 10/24/2015 1645   MCV 95 12/31/2014 1450   MCH 32.2 05/18/2019 1137   MCHC 34.5 05/18/2019 1137   RDW 12.7 05/18/2019 1137   RDW 13.7 10/24/2015 1645    RDW 13.1 12/31/2014 1450   LYMPHSABS 2,440 05/18/2019 1137   LYMPHSABS 3.3 (H) 10/24/2015 1645   MONOABS 0.9 02/21/2018 1945   EOSABS 28 05/18/2019 1137   EOSABS 0.1 10/24/2015 1645   BASOSABS 20 05/18/2019 1137   BASOSABS 0.0 10/24/2015 1645   CMP     Component Value Date/Time   NA 141 05/18/2019 1137   NA 145 (H) 10/24/2015 1645   NA 144 12/31/2014 1450   K 3.7 05/18/2019 1137   K 3.6 12/31/2014 1450   CL  111 (H) 05/18/2019 1137   CL 113 (H) 12/31/2014 1450   CO2 20 05/18/2019 1137   CO2 21 12/31/2014 1450   GLUCOSE 90 05/18/2019 1137   GLUCOSE 107 (H) 12/31/2014 1450   BUN 11 05/18/2019 1137   BUN 11 10/24/2015 1645   BUN 17 12/31/2014 1450   CREATININE 0.92 05/18/2019 1137   CALCIUM 9.9 05/18/2019 1137   CALCIUM 9.4 12/31/2014 1450   PROT 6.7 05/18/2019 1137   PROT 7.8 05/03/2012 0903   ALBUMIN 4.4 01/23/2018 1050   ALBUMIN 3.8 05/03/2012 0903   AST 38 (H) 05/18/2019 1137   AST 30 05/03/2012 0903   ALT 40 (H) 05/18/2019 1137   ALT 25 05/03/2012 0903   ALKPHOS 87 01/23/2018 1050   ALKPHOS 88 05/03/2012 0903   BILITOT 0.7 05/18/2019 1137   BILITOT 0.6 05/03/2012 0903   GFRNONAA 70 05/18/2019 1137   GFRAA 81 05/18/2019 1137    Imaging Studies: No results found.  Assessment and Plan:   Elizabeth Mata is a 58 y.o. y/o female has been referred for abdominal pain  Assessment and Plan: We will await patient's right upper quadrant ultrasound results given that she had mildly elevated liver enzymes on May 19.  We will also repeat liver enzymes to see if they are still abnormal  Upper endoscopy will help rule out any gastritis, peptic ulcer disease from her aspirin use.  In addition she is due for polyp surveillance colonoscopy and biopsies for microscopic colitis given her intermittent loose stools can also be obtained.  however, she will need cardiac clearance from Dr. Ubaldo Glassing prior to her procedures.  I have discussed alternative options, risks & benefits,   which include, but are not limited to, bleeding, infection, perforation,respiratory complication & drug reaction.  The patient agrees with this plan & written consent will be obtained.    In the meantime, we will try once daily PPI for 30 days to see if it improves her symptoms  (Risks of PPI use were discussed with patient including bone loss, C. Diff diarrhea, pneumonia, infections, CKD, electrolyte abnormalities.  If clinically possible based on symptoms, goal would be to maintain patient on the lowest dose possible, or discontinue the medication with institution of acid reflux lifestyle modifications over time. Pt. Verbalizes understanding and chooses to continue the medication.)  Patient also advised to start taking Metamucil to help bulk her stool to see if it helps with her symptoms as well.  Follow Up Instructions: Follow-up 6 weeks Procedures to be scheduled in the upcoming weeks  I discussed the assessment and treatment plan with the patient. The patient was provided an opportunity to ask questions and all were answered. The patient agreed with the plan and demonstrated an understanding of the instructions.   The patient was advised to call back or seek an in-person evaluation if the symptoms worsen or if the condition fails to improve as anticipated.  I provided 30 minutes of face-to-face time via video software during this encounter.  Additional time was spent in reviewing patient's chart, placing orders etc.   Virgel Manifold, MD  Speech recognition software was used to dictate the above note.

## 2019-06-10 ENCOUNTER — Other Ambulatory Visit: Payer: Self-pay

## 2019-06-10 DIAGNOSIS — R1013 Epigastric pain: Secondary | ICD-10-CM

## 2019-06-10 DIAGNOSIS — Z86010 Personal history of colonic polyps: Secondary | ICD-10-CM

## 2019-06-15 ENCOUNTER — Other Ambulatory Visit: Payer: Self-pay

## 2019-06-15 ENCOUNTER — Ambulatory Visit
Admission: RE | Admit: 2019-06-15 | Discharge: 2019-06-15 | Disposition: A | Discharge status: Home / Self Care | Payer: Medicare Other | Source: Ambulatory Visit | Attending: Nurse Practitioner | Admitting: Nurse Practitioner

## 2019-06-15 DIAGNOSIS — R10811 Right upper quadrant abdominal tenderness: Secondary | ICD-10-CM | POA: Diagnosis not present

## 2019-06-15 DIAGNOSIS — R748 Abnormal levels of other serum enzymes: Secondary | ICD-10-CM | POA: Diagnosis not present

## 2019-06-15 DIAGNOSIS — R1013 Epigastric pain: Secondary | ICD-10-CM | POA: Diagnosis not present

## 2019-06-15 DIAGNOSIS — R110 Nausea: Secondary | ICD-10-CM | POA: Diagnosis not present

## 2019-06-15 DIAGNOSIS — K7689 Other specified diseases of liver: Secondary | ICD-10-CM | POA: Diagnosis not present

## 2019-06-18 ENCOUNTER — Other Ambulatory Visit
Admission: RE | Admit: 2019-06-18 | Discharge: 2019-06-18 | Disposition: A | Discharge status: Home / Self Care | Payer: Medicare Other | Source: Ambulatory Visit | Attending: Gastroenterology | Admitting: Gastroenterology

## 2019-06-18 ENCOUNTER — Other Ambulatory Visit: Payer: Self-pay

## 2019-06-18 DIAGNOSIS — Z1159 Encounter for screening for other viral diseases: Secondary | ICD-10-CM | POA: Insufficient documentation

## 2019-06-19 LAB — NOVEL CORONAVIRUS, NAA (HOSP ORDER, SEND-OUT TO REF LAB; TAT 18-24 HRS): SARS-CoV-2, NAA: NOT DETECTED

## 2019-06-23 ENCOUNTER — Encounter: Admission: RE | Disposition: A | Discharge status: Home / Self Care | Payer: Self-pay | Attending: Gastroenterology

## 2019-06-23 ENCOUNTER — Ambulatory Visit: Payer: Medicare Other | Admitting: Anesthesiology

## 2019-06-23 ENCOUNTER — Ambulatory Visit
Admission: RE | Admit: 2019-06-23 | Discharge: 2019-06-23 | Disposition: A | Discharge status: Home / Self Care | Payer: Medicare Other | Source: Intra-hospital | Attending: Gastroenterology | Admitting: Gastroenterology

## 2019-06-23 ENCOUNTER — Encounter: Payer: Self-pay | Admitting: *Deleted

## 2019-06-23 DIAGNOSIS — Z8673 Personal history of transient ischemic attack (TIA), and cerebral infarction without residual deficits: Secondary | ICD-10-CM | POA: Diagnosis not present

## 2019-06-23 DIAGNOSIS — K219 Gastro-esophageal reflux disease without esophagitis: Secondary | ICD-10-CM | POA: Diagnosis not present

## 2019-06-23 DIAGNOSIS — E785 Hyperlipidemia, unspecified: Secondary | ICD-10-CM | POA: Diagnosis not present

## 2019-06-23 DIAGNOSIS — K649 Unspecified hemorrhoids: Secondary | ICD-10-CM

## 2019-06-23 DIAGNOSIS — Z860100 Personal history of colon polyps, unspecified: Secondary | ICD-10-CM

## 2019-06-23 DIAGNOSIS — Z79899 Other long term (current) drug therapy: Secondary | ICD-10-CM | POA: Diagnosis not present

## 2019-06-23 DIAGNOSIS — K644 Residual hemorrhoidal skin tags: Secondary | ICD-10-CM | POA: Insufficient documentation

## 2019-06-23 DIAGNOSIS — Z7982 Long term (current) use of aspirin: Secondary | ICD-10-CM | POA: Diagnosis not present

## 2019-06-23 DIAGNOSIS — D122 Benign neoplasm of ascending colon: Secondary | ICD-10-CM | POA: Diagnosis not present

## 2019-06-23 DIAGNOSIS — Z1211 Encounter for screening for malignant neoplasm of colon: Secondary | ICD-10-CM | POA: Diagnosis not present

## 2019-06-23 DIAGNOSIS — R1013 Epigastric pain: Secondary | ICD-10-CM | POA: Diagnosis not present

## 2019-06-23 DIAGNOSIS — K3189 Other diseases of stomach and duodenum: Secondary | ICD-10-CM

## 2019-06-23 DIAGNOSIS — G8929 Other chronic pain: Secondary | ICD-10-CM

## 2019-06-23 DIAGNOSIS — K635 Polyp of colon: Secondary | ICD-10-CM

## 2019-06-23 DIAGNOSIS — K2960 Other gastritis without bleeding: Secondary | ICD-10-CM | POA: Insufficient documentation

## 2019-06-23 DIAGNOSIS — R569 Unspecified convulsions: Secondary | ICD-10-CM | POA: Diagnosis not present

## 2019-06-23 DIAGNOSIS — K529 Noninfective gastroenteritis and colitis, unspecified: Secondary | ICD-10-CM | POA: Diagnosis not present

## 2019-06-23 DIAGNOSIS — A09 Infectious gastroenteritis and colitis, unspecified: Secondary | ICD-10-CM | POA: Diagnosis not present

## 2019-06-23 DIAGNOSIS — Z800 Family history of malignant neoplasm of digestive organs: Secondary | ICD-10-CM | POA: Insufficient documentation

## 2019-06-23 HISTORY — PX: ESOPHAGOGASTRODUODENOSCOPY (EGD) WITH PROPOFOL: SHX5813

## 2019-06-23 HISTORY — PX: COLONOSCOPY WITH PROPOFOL: SHX5780

## 2019-06-23 SURGERY — ESOPHAGOGASTRODUODENOSCOPY (EGD) WITH PROPOFOL
Anesthesia: General

## 2019-06-23 MED ORDER — GLYCOPYRROLATE 0.2 MG/ML IJ SOLN
INTRAMUSCULAR | Status: DC | PRN
  Administered 2019-06-23: 0.2 mg via INTRAVENOUS

## 2019-06-23 MED ORDER — SODIUM CHLORIDE 0.9 % IV SOLN
INTRAVENOUS | Status: DC
  Administered 2019-06-23: 1000 mL via INTRAVENOUS

## 2019-06-23 MED ORDER — PROPOFOL 10 MG/ML IV BOLUS
INTRAVENOUS | Status: DC | PRN
  Administered 2019-06-23: 70 mg via INTRAVENOUS
  Administered 2019-06-23: 20 mg via INTRAVENOUS

## 2019-06-23 MED ORDER — MIDAZOLAM HCL 2 MG/2ML IJ SOLN
INTRAMUSCULAR | Status: DC | PRN
  Administered 2019-06-23: 1 mg via INTRAVENOUS

## 2019-06-23 MED ORDER — LIDOCAINE HCL (CARDIAC) PF 100 MG/5ML IV SOSY
PREFILLED_SYRINGE | INTRAVENOUS | Status: DC | PRN
  Administered 2019-06-23: 50 mg via INTRAVENOUS

## 2019-06-23 MED ORDER — MIDAZOLAM HCL 2 MG/2ML IJ SOLN
INTRAMUSCULAR | Status: AC
  Filled 2019-06-23: qty 2

## 2019-06-23 MED ORDER — PROPOFOL 500 MG/50ML IV EMUL
INTRAVENOUS | Status: DC | PRN
  Administered 2019-06-23: 140 ug/kg/min via INTRAVENOUS

## 2019-06-23 NOTE — Transfer of Care (Signed)
Immediate Anesthesia Transfer of Care Note  Patient: FLETA BORGESON  Procedure(s) Performed: ESOPHAGOGASTRODUODENOSCOPY (EGD) WITH PROPOFOL (N/A ) COLONOSCOPY WITH PROPOFOL (N/A )  Patient Location: PACU  Anesthesia Type:General  Level of Consciousness: sedated  Airway & Oxygen Therapy: Patient Spontanous Breathing and Patient connected to nasal cannula oxygen  Post-op Assessment: Report given to RN and Post -op Vital signs reviewed and stable  Post vital signs: Reviewed and stable  Last Vitals:  Vitals Value Taken Time  BP 98/75 06/23/19 1154  Temp 36.2 C 06/23/19 1154  Pulse 97 06/23/19 1154  Resp 18 06/23/19 1154  SpO2 100 % 06/23/19 1154  Vitals shown include unvalidated device data.  Last Pain:  Vitals:   06/23/19 1154  TempSrc:   PainSc: 0-No pain         Complications: No apparent anesthesia complications

## 2019-06-23 NOTE — Anesthesia Preprocedure Evaluation (Signed)
Anesthesia Evaluation  Patient identified by MRN, date of birth, ID band Patient awake    Reviewed: Allergy & Precautions, H&P , NPO status , Patient's Chart, lab work & pertinent test results  History of Anesthesia Complications Negative for: history of anesthetic complications  Airway Mallampati: II  TM Distance: >3 FB Neck ROM: full    Dental  (+) Chipped, Poor Dentition, Missing, Partial Upper, Partial Lower   Pulmonary neg pulmonary ROS, neg shortness of breath,           Cardiovascular Exercise Tolerance: Good (-) angina(-) Past MI and (-) DOE negative cardio ROS       Neuro/Psych  Headaches, Seizures -,  TIA Neuromuscular disease negative psych ROS   GI/Hepatic Neg liver ROS, GERD  Medicated and Controlled,  Endo/Other  negative endocrine ROS  Renal/GU Renal disease  negative genitourinary   Musculoskeletal   Abdominal   Peds  Hematology negative hematology ROS (+)   Anesthesia Other Findings Past Medical History: 06/30/2016: Allergic rhinitis No date: Allergy 11/06/2017: Carotid atherosclerosis, bilateral     Comment:  [place] Nov 2018 No date: Cyst of breast, left, solitary No date: Flank pain No date: GERD (gastroesophageal reflux disease) No date: Hemorrhoid No date: Hyperlipidemia 07/18/2016: IFG (impaired fasting glucose)     Comment:  June 2017  No date: Kidney stones 07/24/2016: Neuropathy No date: Seizures ([place]) 02/03/2017: Shingles outbreak No date: Syncope     Comment:  Recurrent over the last 2 years. had workup at [facility] this               year with cardiology and neurology. Had echo and 3-week               event monitor that were unremarkable. Has had negative               EEG. Echo in 11/11 at [facility] showed no significant               abnormalities. Possibly vasovagal No date: Trigger thumb  Past Surgical History: 40s: ABDOMINAL HYSTERECTOMY     Comment:  endometriosis, fibroid tumors; no  cancer, partial 1980'S: BREAST EXCISIONAL BIOPSY; Left     Comment:  EXCISIONAL - NEG No date: BREAST SURGERY 31: CHOLECYSTECTOMY No date: FOOT SURGERY; Right     Comment:  tendon release  BMI    Body Mass Index: 26.66 kg/m      Reproductive/Obstetrics negative OB ROS                             Anesthesia Physical Anesthesia Plan  ASA: III  Anesthesia Plan: General   Post-op Pain Management:    Induction: Intravenous  PONV Risk Score and Plan: Propofol infusion and TIVA  Airway Management Planned: Natural Airway and Nasal Cannula  Additional Equipment:   Intra-op Plan:   Post-operative Plan:   Informed Consent: I have reviewed the patients History and Physical, chart, labs and discussed the procedure including the risks, benefits and alternatives for the proposed anesthesia with the patient or authorized representative who has indicated his/her understanding and acceptance.     Dental Advisory Given  Plan Discussed with: Anesthesiologist, CRNA and Surgeon  Anesthesia Plan Comments: (Patient consented for risks of anesthesia including but not limited to:  - adverse reactions to medications - risk of intubation if required - damage to teeth, lips or other oral mucosa - sore throat or hoarseness - Damage to heart,  brain, lungs or loss of life  Patient voiced understanding.)        Anesthesia Quick Evaluation

## 2019-06-23 NOTE — H&P (Signed)
Vonda Antigua, MD [address] [place], [place], [place], [address] [address], [place], [place], [place], [address] Phone: [phone]  Fax: [phone]  Primary Care Physician:  Arnetha Courser, MD   Pre-Procedure History & Physical: HPI:  Elizabeth Mata is a 58 y.o. female is here for a colonoscopy and EGD.   Past Medical History:  Diagnosis Date  . Allergic rhinitis 06/30/2016  . Allergy   . Carotid atherosclerosis, bilateral 11/06/2017   [place] Nov 2018  . Cyst of breast, left, solitary   . Flank pain   . GERD (gastroesophageal reflux disease)   . Hemorrhoid   . Hyperlipidemia   . IFG (impaired fasting glucose) 07/18/2016   June 2017   . Kidney stones   . Neuropathy 07/24/2016  . Seizures ([place])   . Shingles outbreak 02/03/2017  . Syncope    Recurrent over the last 2 years. had workup at [facility] this year with cardiology and neurology. Had echo and 3-week event monitor that were unremarkable. Has had negative EEG. Echo in 11/11 at [facility] showed no significant abnormalities. Possibly vasovagal  . Trigger thumb     Past Surgical History:  Procedure Laterality Date  . ABDOMINAL HYSTERECTOMY  40s   endometriosis, fibroid tumors; no cancer, partial  . BREAST EXCISIONAL BIOPSY Left 1980'S   EXCISIONAL - NEG  . BREAST SURGERY    . CHOLECYSTECTOMY  31  . FOOT SURGERY Right    tendon release    Prior to Admission medications   Medication Sig Start Date End Date Taking? Authorizing Provider  aspirin EC 81 MG EC tablet Take 1 tablet (81 mg total) by mouth daily. 10/10/17  Yes Henreitta Leber, MD  atorvastatin (LIPITOR) 80 MG tablet Take 1 tablet (80 mg total) by mouth daily. 09/17/18  Yes Lada, Satira Anis, MD  fluticasone (FLONASE) 50 MCG/ACT nasal spray SPRAY 2 SPRAYS INTO EACH NOSTRIL EVERY DAY 04/26/19  Yes [provider]  pantoprazole (PROTONIX) 20 MG tablet Take 1 tablet (20 mg total) by mouth daily for 30 days. 06/09/19 07/09/19 Yes Virgel Manifold, MD   topiramate (TOPAMAX) 200 MG tablet Take 200 mg by mouth 2 (two) times daily.  01/23/17  Yes [provider]    Allergies as of 06/10/2019 - Review Complete 06/09/2019  Allergen Reaction Noted  . Clindamycin/lincomycin Hives 11/10/2015  . Hydrocodone Nausea Only 03/01/2015  . Metronidazole Hives 11/07/2010  . Sulfa antibiotics Hives 07/05/2014  . Oxycodone Nausea And Vomiting 02/05/2017    Family History  Problem Relation Age of Onset  . Coronary artery disease Maternal Uncle   . Diabetes Mellitus II Mother   . Arthritis Mother   . Diabetes Maternal Uncle   . Kidney cancer Father 46       died 21-Apr-2017  . Kidney disease Father   . Cancer Father        kidney and bladder  . Arthritis Maternal Uncle   . Cancer Maternal Grandmother        stomach?  . Jaundice Maternal Grandmother   . Stomach cancer Paternal Grandmother   . Cancer Paternal Grandmother        stomach  . Hypertension Sister   . Stroke Maternal Grandfather   . Diabetes Paternal Grandfather   . Diabetes Sister   . Hypertension Sister   . Breast cancer Neg Hx   . Bladder Cancer Neg Hx     Social History   Socioeconomic History  . Marital status:  Legally Separated    Spouse name: Not on file  . Number of children: 2  . Years of education: Not on file  . Highest education level: GED or equivalent  Occupational History  . Occupation: unemployed  Social Needs  . Financial resource strain: Not very hard  . Food insecurity    Worry: Never true    Inability: Never true  . Transportation needs    Medical: No    Non-medical: No  Tobacco Use  . Smoking status: Never Smoker  . Smokeless tobacco: Never Used  Substance and Sexual Activity  . Alcohol use: Yes    Alcohol/week: 0.0 standard drinks    Comment: occ  . Drug use: Never  . Sexual activity: Yes    Partners: Male  Lifestyle  . Physical activity    Days per week: 5 days    Minutes per session: 30 min  . Stress: Only a little   Relationships  . Social connections    Talks on phone: More than three times a week    Gets together: Three times a week    Attends religious service: More than 4 times per year    Active member of club or organization: No    Attends meetings of clubs or organizations: Never    Relationship status: Separated  . Intimate partner violence    Fear of current or ex partner: No    Emotionally abused: No    Physically abused: No    Forced sexual activity: No  Other Topics Concern  . Not on file  Social History Narrative   Regular exercise    Review of Systems: See HPI, otherwise negative ROS  Physical Exam: BP 107/86   Pulse 83   Temp 98 F (36.7 C) (Tympanic)   Resp 16   Ht 4\' 11"  (1.499 m)   Wt 59.9 kg   SpO2 100%   BMI 26.66 kg/m  General:   Alert,  pleasant and cooperative in NAD Head:  Normocephalic and atraumatic. Neck:  Supple; no masses or thyromegaly. Lungs:  Clear throughout to auscultation, normal respiratory effort.    Heart:  +S1, +S2, Regular rate and rhythm, No edema. Abdomen:  Soft, nontender and nondistended. Normal bowel sounds, without guarding, and without rebound.   Neurologic:  Alert and  oriented x4;  grossly normal neurologically.  Impression/Plan: Elizabeth Mata is here for a colonoscopy to be performed for polyp surveillance and EGD for Abdominal pain  Risks, benefits, limitations, and alternatives regarding the procedures have been reviewed with the patient.  Questions have been answered.  All parties agreeable.   Virgel Manifold, MD  06/23/2019, 10:24 AM

## 2019-06-23 NOTE — Anesthesia Postprocedure Evaluation (Signed)
Anesthesia Post Note  Patient: Elizabeth Mata  Procedure(s) Performed: ESOPHAGOGASTRODUODENOSCOPY (EGD) WITH PROPOFOL (N/A ) COLONOSCOPY WITH PROPOFOL (N/A )  Patient location during evaluation: PACU Anesthesia Type: General Level of consciousness: awake and alert Pain management: pain level controlled Vital Signs Assessment: post-procedure vital signs reviewed and stable Respiratory status: spontaneous breathing, nonlabored ventilation and respiratory function stable Cardiovascular status: blood pressure returned to baseline and stable Postop Assessment: no apparent nausea or vomiting Anesthetic complications: no     Last Vitals:  Vitals:   06/23/19 1154 06/23/19 1203  BP: 98/75 111/73  Pulse: 96   Resp: 19   Temp: (!) 36.2 C   SpO2: 100%     Last Pain:  Vitals:   06/23/19 1223  TempSrc:   PainSc: 0-No pain                 Durenda Hurt

## 2019-06-23 NOTE — Anesthesia Post-op Follow-up Note (Signed)
Anesthesia QCDR form completed.        

## 2019-06-23 NOTE — Op Note (Signed)
[facility] Gastroenterology Patient Name: Elizabeth Mata Procedure Date: 06/23/2019 11:02 AM MRN: [id number] Account #: [id number] Date of Birth: [birth year] Admit Type: Outpatient Age: 58 Room: [facility] ENDO ROOM 4 Gender: Female Note Status: Finalized Procedure:            Colonoscopy Indications:          High risk colon cancer surveillance: Personal history                        of colonic polyps Providers:            Daryan Cagley B. Bonna Gains MD, MD Referring MD:         Arnetha Courser (Referring MD) Medicines:            Monitored Anesthesia Care Complications:        No immediate complications. Procedure:            Pre-Anesthesia Assessment:                       - ASA Grade Assessment: II - A patient with mild                        systemic disease.                       - Prior to the procedure, a History and Physical was                        performed, and patient medications, allergies and                        sensitivities were reviewed. The patient's tolerance of                        previous anesthesia was reviewed.                       - The risks and benefits of the procedure and the                        sedation options and risks were discussed with the                        patient. All questions were answered and informed                        consent was obtained.                       - Patient identification and proposed procedure were                        verified prior to the procedure by the physician, the                        nurse, the anesthesiologist, the anesthetist and the                        technician. The procedure was verified in the procedure  room.                       After obtaining informed consent, the colonoscope was                        passed under direct vision. Throughout the procedure,                        the patient's blood pressure, pulse, and oxygen   saturations were monitored continuously. The                        Colonoscope was introduced through the anus and                        advanced to the the cecum, identified by appendiceal                        orifice and ileocecal valve. The colonoscopy was                        performed with ease. The patient tolerated the                        procedure well. The quality of the bowel preparation                        was good. Findings:      Hemorrhoids were found on perianal exam.      A 2 mm polyp was found in the cecum. The polyp was flat. The polyp was       removed with a cold biopsy forceps. Resection and retrieval were       complete.      The exam was otherwise without abnormality.      The rectum, sigmoid colon, descending colon, transverse colon, ascending       colon and cecum appeared normal. Biopsies for histology were taken with       a cold forceps from the cecum, ascending colon, transverse colon and       descending colon for evaluation of microscopic colitis.      The retroflexed view of the distal rectum and anal verge was normal and       showed no anal or rectal abnormalities. Impression:           - Hemorrhoids found on perianal exam.                       - One 2 mm polyp in the cecum, removed with a cold                        biopsy forceps. Resected and retrieved.                       - The examination was otherwise normal.                       - The rectum, sigmoid colon, descending colon,                        transverse colon, ascending colon and cecum are normal.  Biopsied.                       - The distal rectum and anal verge are normal on                        retroflexion view. Recommendation:       - Discharge patient to home (with escort).                       - Advance diet as tolerated.                       - Continue present medications.                       - Await pathology results.                        - Repeat colonoscopy in 5 years.                       - The findings and recommendations were discussed with                        the patient.                       - The findings and recommendations were discussed with                        the patient's family.                       - Return to primary care physician as previously                        scheduled. Procedure Code(s):    --- Professional ---                       [phone], Colonoscopy, flexible; with biopsy, single or                        multiple Diagnosis Code(s):    --- Professional ---                       Z86.010, Personal history of colonic polyps                       K63.5, Polyp of colon                       K64.9, Unspecified hemorrhoids CPT copyright 2019 American Medical Association. All rights reserved. The codes documented in this report are preliminary and upon coder review may  be revised to meet current compliance requirements.  Vonda Antigua, MD Margretta Sidle B. Bonna Gains MD, MD 06/23/2019 11:55:38 AM This report has been signed electronically. Number of Addenda: 0 Note Initiated On: 06/23/2019 11:02 AM Scope Withdrawal Time: 0 hours 14 minutes 11 seconds  Total Procedure Duration: 0 hours 23 minutes 15 seconds  Estimated Blood Loss: Estimated blood loss: none.      [facility]

## 2019-06-23 NOTE — Op Note (Signed)
[facility] Gastroenterology Patient Name: Elizabeth Mata Procedure Date: 06/23/2019 11:03 AM MRN: [id number] Account #: [id number] Date of Birth: 11-02-1961 Admit Type: Outpatient Age: 58 Room: [facility] ENDO ROOM 4 Gender: Female Note Status: Finalized Procedure:            Upper GI endoscopy Indications:          Epigastric abdominal pain Providers:            Channa Hazelett B. Bonna Gains MD, MD Referring MD:         Arnetha Courser (Referring MD) Medicines:            Monitored Anesthesia Care Complications:        No immediate complications. Procedure:            Pre-Anesthesia Assessment:                       - Prior to the procedure, a History and Physical was                        performed, and patient medications, allergies and                        sensitivities were reviewed. The patient's tolerance of                        previous anesthesia was reviewed.                       - The risks and benefits of the procedure and the                        sedation options and risks were discussed with the                        patient. All questions were answered and informed                        consent was obtained.                       - Patient identification and proposed procedure were                        verified prior to the procedure by the physician, the                        nurse, the anesthesiologist, the anesthetist and the                        technician. The procedure was verified in the procedure                        room.                       - ASA Grade Assessment: II - A patient with mild                        systemic disease.  After obtaining informed consent, the endoscope was                        passed under direct vision. Throughout the procedure,                        the patient's blood pressure, pulse, and oxygen                        saturations were monitored continuously. The Endoscope                was introduced through the mouth, and advanced to the                        second part of duodenum. The upper GI endoscopy was                        accomplished with ease. The patient tolerated the                        procedure well. Findings:      The examined esophagus was normal.      Patchy mildly erythematous mucosa without bleeding was found in the       gastric antrum. Biopsies were taken with a cold forceps for histology.       Biopsies were obtained in the gastric body, at the incisura and in the       gastric antrum with cold forceps for histology.      The duodenal bulb, second portion of the duodenum and examined duodenum       were normal. Impression:           - Normal esophagus.                       - Erythematous mucosa in the antrum. Biopsied.                       - Normal duodenal bulb, second portion of the duodenum                        and examined duodenum.                       - Biopsies were obtained in the gastric body, at the                        incisura and in the gastric antrum. Recommendation:       - Await pathology results.                       - Discharge patient to home (with escort).                       - Advance diet as tolerated.                       - Continue present medications.                       - Patient has a contact number available for  emergencies. The signs and symptoms of potential                        delayed complications were discussed with the patient.                        Return to normal activities tomorrow. Written discharge                        instructions were provided to the patient.                       - Discharge patient to home (with escort).                       - The findings and recommendations were discussed with                        the patient.                       - The findings and recommendations were discussed with                        the  patient's family.                       - Avoid NSAIDs except Aspirin if medically indicated by                        PCP Procedure Code(s):    --- Professional ---                       [phone], Esophagogastroduodenoscopy, flexible, transoral;                        with biopsy, single or multiple Diagnosis Code(s):    --- Professional ---                       K31.89, Other diseases of stomach and duodenum                       R10.13, Epigastric pain CPT copyright 2019 American Medical Association. All rights reserved. The codes documented in this report are preliminary and upon coder review may  be revised to meet current compliance requirements.  Vonda Antigua, MD Margretta Sidle B. Bonna Gains MD, MD 06/23/2019 11:24:24 AM This report has been signed electronically. Number of Addenda: 0 Note Initiated On: 06/23/2019 11:03 AM Estimated Blood Loss: Estimated blood loss: none.      [facility]

## 2019-06-24 ENCOUNTER — Encounter: Payer: Self-pay | Admitting: Gastroenterology

## 2019-06-24 LAB — SURGICAL PATHOLOGY

## 2019-06-25 ENCOUNTER — Other Ambulatory Visit: Payer: Self-pay | Admitting: Gastroenterology

## 2019-06-25 DIAGNOSIS — R195 Other fecal abnormalities: Secondary | ICD-10-CM

## 2019-06-29 ENCOUNTER — Other Ambulatory Visit: Payer: Self-pay

## 2019-06-29 ENCOUNTER — Telehealth: Payer: Self-pay | Admitting: Gastroenterology

## 2019-06-29 DIAGNOSIS — R195 Other fecal abnormalities: Secondary | ICD-10-CM

## 2019-06-29 NOTE — Telephone Encounter (Signed)
Patient came in & states she was called 06-28-19 & was told the results of her colonoscopy but is unable to remember. Please call & review colonoscopy results.

## 2019-07-01 NOTE — Telephone Encounter (Signed)
Pt has been provided with a printed copy of results

## 2019-07-11 LAB — GI PROFILE, STOOL, PCR

## 2019-07-14 ENCOUNTER — Other Ambulatory Visit: Payer: Self-pay | Admitting: Gastroenterology

## 2019-07-21 ENCOUNTER — Encounter: Payer: Self-pay | Admitting: Gastroenterology

## 2019-07-21 ENCOUNTER — Ambulatory Visit (INDEPENDENT_AMBULATORY_CARE_PROVIDER_SITE_OTHER): Payer: Medicare Other | Admitting: Gastroenterology

## 2019-07-21 ENCOUNTER — Other Ambulatory Visit: Payer: Self-pay

## 2019-07-21 VITALS — BP 116/82 | HR 66 | Temp 99.0°F | Ht 59.0 in | Wt 133.8 lb

## 2019-07-21 DIAGNOSIS — R634 Abnormal weight loss: Secondary | ICD-10-CM | POA: Diagnosis not present

## 2019-07-21 DIAGNOSIS — R1013 Epigastric pain: Secondary | ICD-10-CM | POA: Diagnosis not present

## 2019-07-21 DIAGNOSIS — R197 Diarrhea, unspecified: Secondary | ICD-10-CM

## 2019-07-21 DIAGNOSIS — R748 Abnormal levels of other serum enzymes: Secondary | ICD-10-CM

## 2019-07-21 NOTE — Progress Notes (Signed)
Vonda Antigua, MD [address]  [place]  [place], [place] [address]  Main: [phone]  Fax: [phone]   Primary Care Physician: Fredderick Severance, NP   Chief Complaint  Patient presents with  . Abdominal Pain    HPI: Elizabeth Mata is a 58 y.o. female here for follow-up of abdominal pain.  Patient reports postprandial abdominal pain, but is also occurring without food.  Epigastric to mid abdominal region.  Dull, nonradiating, 4/10.  Also associated with loose bowel movements.  States has to have a bowel movement immediately after eating.  No fever or chills.  Was prescribed Protonix but did not take this as she states she has taken this in the past for 10 days and it did not help.  EGD June 2020 showed gastric erythema Colonoscopy June 2020 with 2 mm cecum polyp removedAnd random colon biopsies obtained  DIAGNOSIS:  A. STOMACH ERYTHEMA; COLD BIOPSY:  - ANTRAL MUCOSA WITH MILD REACTIVE GASTRITIS.  - MILDLY EDEMATOUS OXYNTIC MUCOSA.  - NEGATIVE FOR H. PYLORI, DYSPLASIA, AND MALIGNANCY.   B. COLON POLYP, CECUM; COLD BIOPSY:  - FRAGMENT OF MILDLY INFLAMED COLONIC MUCOSA (MILD ACTIVE COLITIS).  - DEEPER SECTIONS EXAMINED.  - NEGATIVE FOR ARCHITECTURAL FEATURES OF CHRONICITY, VIRAL CYTOPATHIC  EFFECT, MICROSCOPIC COLITIS, DYSPLASIA, AND MALIGNANCY.   C. COLON, RANDOM; COLD BIOPSY:  - MILD EDEMATOUR COLONIC MUCOSA WITH PATCHY MINIMAL TO MILD ACTIVE  COLITIS INVOLVING A MAJORITY OF THE BIOPSY FRAGMENTS, SEE COMMENT.  - FOCAL LYMPHOID AGGREGATE.  - NEGATIVE FOR ARCHITECTURAL FEATURES OF CHRONICITY, VIRAL CYTOPATHIC  EFFECT, MICROSCOPIC COLITIS, DYSPLASIA, AND MALIGNANCY.   Comment:  Given the histologic findings in the colon biopsies (parts B and C) a  resolving self-limited colitis, likely infectious, is favored.   GI profile including C. difficile testing was negative.  Current Outpatient Medications  Medication Sig Dispense Refill  . aspirin EC 81  MG EC tablet Take 1 tablet (81 mg total) by mouth daily. 30 tablet 1  . atorvastatin (LIPITOR) 80 MG tablet Take 1 tablet (80 mg total) by mouth daily. 90 tablet 3  . fluticasone (FLONASE) 50 MCG/ACT nasal spray SPRAY 2 SPRAYS INTO EACH NOSTRIL EVERY DAY    . pantoprazole (PROTONIX) 20 MG tablet TAKE 1 TABLET BY MOUTH EVERY DAY 30 tablet 0  . topiramate (TOPAMAX) 200 MG tablet Take 200 mg by mouth 2 (two) times daily.   11   No current facility-administered medications for this visit.     Allergies as of 07/21/2019 - Review Complete 07/21/2019  Allergen Reaction Noted  . Clindamycin/lincomycin Hives 11/10/2015  . Hydrocodone Nausea Only 03/01/2015  . Metronidazole Hives 11/07/2010  . Sulfa antibiotics Hives 07/05/2014  . Oxycodone Nausea And Vomiting 02/05/2017    ROS:  General: Negative for anorexia, weight loss, fever, chills, fatigue, weakness. ENT: Negative for hoarseness, difficulty swallowing , nasal congestion. CV: Negative for chest pain, angina, palpitations, dyspnea on exertion, peripheral edema.  Respiratory: Negative for dyspnea at rest, dyspnea on exertion, cough, sputum, wheezing.  GI: See history of present illness. GU:  Negative for dysuria, hematuria, urinary incontinence, urinary frequency, nocturnal urination.  Endo: Negative for unusual weight change.    Physical Examination:   BP 116/82   Pulse 66   Temp 99 F (37.2 C) (Oral)   Ht 4\' 11"  (1.499 m)   Wt 133 lb 12.8 oz (60.7 kg)   BMI 27.02 kg/m   General: Well-nourished, well-developed in no acute distress.  Eyes: No icterus. Conjunctivae  pink. Mouth: Oropharyngeal mucosa moist and pink , no lesions erythema or exudate. Neck: Supple, Trachea midline Abdomen: Bowel sounds are normal, nontender, nondistended, no hepatosplenomegaly or masses, no abdominal bruits or hernia , no rebound or guarding.   Extremities: No lower extremity edema. No clubbing or deformities. Neuro: Alert and oriented x 3.  Grossly  intact. Skin: Warm and dry, no jaundice.   Psych: Alert and cooperative, normal mood and affect.   Labs: CMP     Component Value Date/Time   NA 141 05/18/2019 1137   NA 145 (H) 10/24/2015 1645   NA 144 12/31/2014 1450   K 3.7 05/18/2019 1137   K 3.6 12/31/2014 1450   CL 111 (H) 05/18/2019 1137   CL 113 (H) 12/31/2014 1450   CO2 20 05/18/2019 1137   CO2 21 12/31/2014 1450   GLUCOSE 90 05/18/2019 1137   GLUCOSE 107 (H) 12/31/2014 1450   BUN 11 05/18/2019 1137   BUN 11 10/24/2015 1645   BUN 17 12/31/2014 1450   CREATININE 0.92 05/18/2019 1137   CALCIUM 9.9 05/18/2019 1137   CALCIUM 9.4 12/31/2014 1450   PROT 6.7 05/18/2019 1137   PROT 7.8 05/03/2012 0903   ALBUMIN 4.4 01/23/2018 1050   ALBUMIN 3.8 05/03/2012 0903   AST 38 (H) 05/18/2019 1137   AST 30 05/03/2012 0903   ALT 40 (H) 05/18/2019 1137   ALT 25 05/03/2012 0903   ALKPHOS 87 01/23/2018 1050   ALKPHOS 88 05/03/2012 0903   BILITOT 0.7 05/18/2019 1137   BILITOT 0.6 05/03/2012 0903   GFRNONAA 70 05/18/2019 1137   GFRAA 81 05/18/2019 1137   Lab Results  Component Value Date   WBC 4.0 05/18/2019   HGB 14.0 05/18/2019   HCT 40.6 05/18/2019   MCV 93.3 05/18/2019   PLT 309 05/18/2019    Imaging Studies: No results found.  Assessment and Plan:   Elizabeth Mata is a 58 y.o. y/o female with abdominal pain, loose stools, and elevated liver enzymes  Patient has not had any weight loss despite her symptoms  We will obtain CTA as patient is reporting postprandial abdominal pain to rule out mesenteric ischemia  Her biopsies show self-limited colitis but her GI panel done after these biopsies were negative for any infections  If her symptoms continue and CTA is unrevealing, we may need to repeat colonoscopy in 3 to 4 months to reassess if the colitis has resolved or if it has developed chronic changes  We will also repeat liver enzymes  Right upper quadrant ultrasound showed benign hepatic cysts  Dr Vonda Antigua

## 2019-07-22 LAB — HEPATIC FUNCTION PANEL
ALT: 34 IU/L — ABNORMAL HIGH (ref 0–32)
AST: 31 IU/L (ref 0–40)
Albumin: 4.4 g/dL (ref 3.8–4.9)
Alkaline Phosphatase: 84 IU/L (ref 39–117)
Bilirubin Total: 0.4 mg/dL (ref 0.0–1.2)
Bilirubin, Direct: 0.13 mg/dL (ref 0.00–0.40)
Total Protein: 6.5 g/dL (ref 6.0–8.5)

## 2019-07-22 NOTE — Addendum Note (Signed)
Addended by: Vonda Antigua on: 07/22/2019 01:06 PM   Modules accepted: Orders

## 2019-07-26 ENCOUNTER — Ambulatory Visit
Admission: RE | Admit: 2019-07-26 | Discharge: 2019-07-26 | Disposition: A | Discharge status: Home / Self Care | Payer: Medicare Other | Source: Ambulatory Visit | Attending: Gastroenterology | Admitting: Gastroenterology

## 2019-07-26 ENCOUNTER — Telehealth: Payer: Self-pay

## 2019-07-26 ENCOUNTER — Other Ambulatory Visit: Payer: Self-pay

## 2019-07-26 ENCOUNTER — Other Ambulatory Visit
Admission: RE | Admit: 2019-07-26 | Discharge: 2019-07-26 | Disposition: A | Discharge status: Home / Self Care | Payer: Medicare Other | Source: Ambulatory Visit | Attending: Gastroenterology | Admitting: Gastroenterology

## 2019-07-26 DIAGNOSIS — N200 Calculus of kidney: Secondary | ICD-10-CM | POA: Diagnosis not present

## 2019-07-26 DIAGNOSIS — R749 Abnormal serum enzyme level, unspecified: Secondary | ICD-10-CM | POA: Insufficient documentation

## 2019-07-26 DIAGNOSIS — R1013 Epigastric pain: Secondary | ICD-10-CM | POA: Diagnosis not present

## 2019-07-26 DIAGNOSIS — Z9049 Acquired absence of other specified parts of digestive tract: Secondary | ICD-10-CM | POA: Diagnosis not present

## 2019-07-26 DIAGNOSIS — R748 Abnormal levels of other serum enzymes: Secondary | ICD-10-CM

## 2019-07-26 LAB — FERRITIN: Ferritin: 81 ng/mL (ref 11–307)

## 2019-07-26 MED ORDER — IOHEXOL 350 MG/ML SOLN
85.0000 mL | Freq: Once | INTRAVENOUS | Status: AC | PRN
  Administered 2019-07-26: 85 mL via INTRAVENOUS

## 2019-07-26 NOTE — Telephone Encounter (Signed)
Pt left vm returning Debbie's call regarding Labs

## 2019-07-26 NOTE — Telephone Encounter (Signed)
-----   Message from Virgel Manifold, MD sent at 07/22/2019  1:07 PM EDT ----- Jackelyn Poling please let patient know, her liver enzymes are still slightly elevated but better than before.  I have ordered further lab tests for elevated liver enzymes

## 2019-07-26 NOTE — Telephone Encounter (Signed)
Left message that more lab work is needed and may have this done at the hospital. To contact the office for more information.

## 2019-07-26 NOTE — Telephone Encounter (Signed)
Pt notified of lab results and will go to [facility] lab to have lab work done after her CT scan.

## 2019-07-27 LAB — HEPATITIS C ANTIBODY (REFLEX): HCV Ab: <0.1 s/co ratio (ref 0.0–0.9)

## 2019-07-27 LAB — HEPATITIS B SURFACE ANTIGEN: Hepatitis B Surface Ag: NEGATIVE

## 2019-07-27 LAB — HEPATITIS B CORE ANTIBODY, TOTAL: Hep B Core Total Ab: NEGATIVE

## 2019-07-27 LAB — HEPATITIS A ANTIBODY, TOTAL: hep A Total Ab: NEGATIVE

## 2019-07-27 LAB — HCV COMMENT:

## 2019-07-27 LAB — HEPATITIS B SURFACE ANTIBODY,QUALITATIVE: Hep B S Ab: NONREACTIVE

## 2019-07-27 LAB — CERULOPLASMIN: Ceruloplasmin: 22 mg/dL (ref 19.0–39.0)

## 2019-07-28 LAB — MITOCHONDRIAL/SMOOTH MUSCLE AB PNL
F-Actin IgG: 2 Units (ref 0–19)
Mitochondrial M2 Ab, IgG: <20 Units (ref 0.0–20.0)

## 2019-08-03 ENCOUNTER — Ambulatory Visit (INDEPENDENT_AMBULATORY_CARE_PROVIDER_SITE_OTHER): Payer: Medicare Other | Admitting: Family Medicine

## 2019-08-03 ENCOUNTER — Other Ambulatory Visit: Payer: Self-pay

## 2019-08-03 ENCOUNTER — Encounter: Payer: Self-pay | Admitting: Family Medicine

## 2019-08-03 ENCOUNTER — Other Ambulatory Visit (HOSPITAL_COMMUNITY)
Admission: RE | Admit: 2019-08-03 | Discharge: 2019-08-03 | Disposition: A | Discharge status: Home / Self Care | Payer: Medicare Other | Source: Ambulatory Visit | Attending: Family Medicine | Admitting: Family Medicine

## 2019-08-03 VITALS — BP 120/76 | HR 85 | Temp 97.9°F | Resp 16 | Ht 59.0 in | Wt 134.7 lb

## 2019-08-03 DIAGNOSIS — R319 Hematuria, unspecified: Secondary | ICD-10-CM

## 2019-08-03 DIAGNOSIS — N200 Calculus of kidney: Secondary | ICD-10-CM

## 2019-08-03 DIAGNOSIS — I6523 Occlusion and stenosis of bilateral carotid arteries: Secondary | ICD-10-CM | POA: Diagnosis not present

## 2019-08-03 DIAGNOSIS — R350 Frequency of micturition: Secondary | ICD-10-CM | POA: Diagnosis not present

## 2019-08-03 DIAGNOSIS — Z113 Encounter for screening for infections with a predominantly sexual mode of transmission: Secondary | ICD-10-CM | POA: Diagnosis not present

## 2019-08-03 DIAGNOSIS — N898 Other specified noninflammatory disorders of vagina: Secondary | ICD-10-CM

## 2019-08-03 DIAGNOSIS — Z114 Encounter for screening for human immunodeficiency virus [HIV]: Secondary | ICD-10-CM | POA: Diagnosis not present

## 2019-08-03 LAB — POCT URINALYSIS DIPSTICK
Bilirubin, UA: NEGATIVE
Glucose, UA: NEGATIVE
Ketones, UA: NEGATIVE
Leukocytes, UA: NEGATIVE
Nitrite, UA: NEGATIVE
Protein, UA: NEGATIVE
Spec Grav, UA: 1.015 (ref 1.010–1.025)
Urobilinogen, UA: NEGATIVE E.U./dL — AB
pH, UA: 5 (ref 5.0–8.0)

## 2019-08-03 MED ORDER — ATORVASTATIN CALCIUM 80 MG PO TABS: 80.0000 mg | ORAL_TABLET | Freq: Every day | ORAL | 3 refills | Status: DC

## 2019-08-03 MED ORDER — KETOROLAC TROMETHAMINE 10 MG PO TABS: 10.0000 mg | ORAL_TABLET | Freq: Three times a day (TID) | ORAL | 0 refills | Status: DC | PRN

## 2019-08-03 NOTE — Progress Notes (Signed)
Name: Elizabeth Mata   MRN: [id number]    DOB: 15-Nov-1961   Date:08/03/2019       Progress Note  Subjective  Chief Complaint  Chief Complaint  Patient presents with  . Vaginitis    occasional odor    HPI  Pt presents with concern for vaginal odor, mild increase in urinary frequency, low back discomfort and pelvic pressure for a few days.  She did have CT abdomen 07/26/2019 which did note a 57mm non-obstructing LEFT interpolar renal stone.  She has had 1 partner recently, would like STI screening today.  We will check these for her along with urine culture.  No N/V or fevers or chills.  Patient Active Problem List   Diagnosis Date Noted  . History of colonic polyps   . Polyp of ascending colon   . Hemorrhoids   . Stomach irritation   . Abdominal pain, epigastric   . Dizziness 10/27/2018  . Palpitations 10/27/2018  . Urinary urgency 04/21/2018  . Menopausal symptom 04/21/2018  . Urticaria 01/05/2018  . Carotid atherosclerosis, bilateral 11/06/2017  . TIA (transient ischemic attack) 10/28/2017  . Hx of completed stroke 10/28/2017  . Ataxia 10/08/2017  . Breast pain in female 07/11/2017  . Medication monitoring encounter 12/20/2016  . Abnormality of globulin 07/26/2016  . Neuropathy 07/24/2016  . 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 IFG (impaired fasting glucose) 07/18/2016  . Hematuria 07/03/2016  . Allergic rhinitis 06/30/2016  . Chronic left hip pain 06/28/2016  . Breast cancer screening 04/17/2016  . Skin texture changes 12/27/2015  . Vaginal discharge 10/31/2015  . Screening for STD (sexually transmitted disease) 10/31/2015  . Kidney stone on left side 10/24/2015  . Annual physical exam 09/13/2015  . Encounter for screening mammogram for malignant neoplasm of breast 09/13/2015  . Need for immunization against influenza 09/13/2015  . Eczema 09/13/2015  . Irritable bowel syndrome with constipation 09/13/2015  . Seizures ([place]) 08/16/2015  . Carpal tunnel  syndrome 07/26/2014  . Carotid artery narrowing 07/05/2013  . Basilar artery migraine 07/06/2012    Social History   Tobacco Use  . Smoking status: Never Smoker  . Smokeless tobacco: Never Used  Substance Use Topics  . Alcohol use: Yes    Alcohol/week: 0.0 standard drinks    Comment: occ     Current Outpatient Medications:  .  aspirin EC 81 MG EC tablet, Take 1 tablet (81 mg total) by mouth daily., Disp: 30 tablet, Rfl: 1 .  atorvastatin (LIPITOR) 80 MG tablet, Take 1 tablet (80 mg total) by mouth daily., Disp: 90 tablet, Rfl: 3 .  fluticasone (FLONASE) 50 MCG/ACT nasal spray, SPRAY 2 SPRAYS INTO EACH NOSTRIL EVERY DAY, Disp: , Rfl:  .  pantoprazole (PROTONIX) 20 MG tablet, TAKE 1 TABLET BY MOUTH EVERY DAY, Disp: 30 tablet, Rfl: 0 .  topiramate (TOPAMAX) 200 MG tablet, Take 200 mg by mouth 2 (two) times daily. , Disp: , Rfl: 11 .  ketorolac (TORADOL) 10 MG tablet, Take 1 tablet (10 mg total) by mouth every 8 (eight) hours as needed for moderate pain or severe pain., Disp: 20 tablet, Rfl: 0  Allergies  Allergen Reactions  . Clindamycin/Lincomycin Hives  . Hydrocodone Nausea Only  . Metronidazole Hives  . Sulfa Antibiotics Hives    Break out in hives all over  . Oxycodone Nausea And Vomiting    I personally reviewed active problem list, medication list, allergies, notes from last encounter, lab results with the patient/caregiver today.  ROS  Ten systems reviewed and is negative except as mentioned in HPI   Objective  Vitals:   08/03/19 1334  BP: 120/76  Pulse: 85  Resp: 16  Temp: 97.9 F (36.6 C)  TempSrc: Oral  SpO2: 93%  Weight: 134 lb 11.2 oz (61.1 kg)  Height: 4\' 11"  (1.499 m)   Body mass index is 27.21 kg/m.  Nursing Note and Vital Signs reviewed.  Physical Exam  Constitutional: Patient appears well-developed and well-nourished.  No distress.  HEENT: head atraumatic, normocephalic, pupils equal and reactive to light Cardiovascular: Normal rate,  regular rhythm and normal heart sounds.  No murmur heard. No BLE edema. Pulmonary/Chest: Effort normal and breath sounds clear bilaterally. No respiratory distress. Abdominal: Soft, bowel sounds normal, there is some mild tenderness in the general abdomen, no rebound, no HSM. No CVA tenderness Psychiatric: Patient has a normal mood and affect. behavior is normal. Judgment and thought content normal.  Results for orders placed or performed in visit on 08/03/19 (from the past 72 hour(s))  POCT urinalysis dipstick     Status: Abnormal   Collection Time: 08/03/19  2:14 PM  Result Value Ref Range   Color, UA yellow    Clarity, UA clear    Glucose, UA Negative Negative   Bilirubin, UA negative    Ketones, UA negative    Spec Grav, UA 1.015 1.010 - 1.025   Blood, UA large    pH, UA 5.0 5.0 - 8.0   Protein, UA Negative Negative   Urobilinogen, UA negative (A) 0.2 or 1.0 E.U./dL   Nitrite, UA negative    Leukocytes, UA Negative Negative   Appearance clear    Odor none     Assessment & Plan  1. Urinary frequency - POCT urinalysis dipstick - Urine Culture - atorvastatin (LIPITOR) 80 MG tablet; Take 1 tablet (80 mg total) by mouth daily.  Dispense: 90 tablet; Refill: 3  2. Hematuria, unspecified type - POCT urinalysis dipstick - Urine Culture - atorvastatin (LIPITOR) 80 MG tablet; Take 1 tablet (80 mg total) by mouth daily.  Dispense: 90 tablet; Refill: 3  3. Vaginal odor - Cervicovaginal ancillary only  4. Routine screening for STI (sexually transmitted infection) - HIV Antibody (routine testing w rflx) - RPR  5. Encounter for screening for HIV - HIV Antibody (routine testing w rflx)  6. Carotid atherosclerosis, bilateral - atorvastatin (LIPITOR) 80 MG tablet; Take 1 tablet (80 mg total) by mouth daily.  Dispense: 90 tablet; Refill: 3  7. Nephrolithiasis - She did have small stone on recent CT scan - cannot take flomax due to allergy to sulfa, but will give toradol to treat  for discomfort while we await additional testing.  - POCT urinalysis dipstick - Urine Culture - ketorolac (TORADOL) 10 MG tablet; Take 1 tablet (10 mg total) by mouth every 8 (eight) hours as needed for moderate pain or severe pain.  Dispense: 20 tablet; Refill: 0  -Red flags and when to present for emergency care or RTC including fever >101.3F, chest pain, shortness of breath, new/worsening/un-resolving symptoms, decreased urine output, frank hematuria reviewed with patient at time of visit. Follow up and care instructions discussed and provided in AVS.

## 2019-08-05 LAB — HIV ANTIBODY (ROUTINE TESTING W REFLEX): HIV 1&2 Ab, 4th Generation: NONREACTIVE

## 2019-08-05 LAB — URINE CULTURE
MICRO NUMBER:: 735277
Result:: NO GROWTH
SPECIMEN QUALITY:: ADEQUATE

## 2019-08-05 LAB — RPR: RPR Ser Ql: REACTIVE — AB

## 2019-08-05 LAB — RPR TITER: RPR Titer: 1:1 {titer} — ABNORMAL HIGH

## 2019-08-05 LAB — FLUORESCENT TREPONEMAL AB(FTA)-IGG-BLD: Fluorescent Treponemal ABS: NONREACTIVE

## 2019-08-07 ENCOUNTER — Telehealth: Payer: Self-pay

## 2019-08-07 LAB — CERVICOVAGINAL ANCILLARY ONLY
Candida vaginitis: NEGATIVE
Chlamydia: NEGATIVE
Neisseria Gonorrhea: NEGATIVE
Trichomonas: NEGATIVE

## 2019-08-09 ENCOUNTER — Telehealth: Payer: Self-pay | Admitting: Family Medicine

## 2019-08-09 ENCOUNTER — Encounter: Payer: Self-pay | Admitting: Nurse Practitioner

## 2019-08-09 ENCOUNTER — Other Ambulatory Visit: Payer: Self-pay | Admitting: Family Medicine

## 2019-08-09 ENCOUNTER — Other Ambulatory Visit: Payer: Self-pay

## 2019-08-09 ENCOUNTER — Telehealth: Payer: Self-pay

## 2019-08-09 ENCOUNTER — Ambulatory Visit: Payer: Medicare Other | Admitting: Nurse Practitioner

## 2019-08-09 DIAGNOSIS — B9689 Other specified bacterial agents as the cause of diseases classified elsewhere: Secondary | ICD-10-CM

## 2019-08-09 DIAGNOSIS — Z20822 Contact with and (suspected) exposure to covid-19: Secondary | ICD-10-CM

## 2019-08-09 DIAGNOSIS — I6523 Occlusion and stenosis of bilateral carotid arteries: Secondary | ICD-10-CM

## 2019-08-09 DIAGNOSIS — N760 Acute vaginitis: Secondary | ICD-10-CM

## 2019-08-09 DIAGNOSIS — N200 Calculus of kidney: Secondary | ICD-10-CM

## 2019-08-09 DIAGNOSIS — R768 Other specified abnormal immunological findings in serum: Secondary | ICD-10-CM

## 2019-08-09 MED ORDER — REPHRESH VA GEL: 1.0000 | Freq: Every evening | VAGINAL | 0 refills | Status: AC | PRN

## 2019-08-09 NOTE — Progress Notes (Signed)
   [place] problem visit  [place] Department  Subjective:  Elizabeth Mata is a 58 y.o. being seen today for - + RPR  Chief Complaint  Patient presents with  . SEXUALLY TRANSMITTED DISEASE    Treatment    Client into clinic for follow up + RPR results Biological false positive Bacterial vaginosis - noted after visit/lab results review    Does the patient have a current or past history of drug use? No   No components found for: HCV]   Health Maintenance Due  Topic Date Due  . INFLUENZA VACCINE  07/31/2019    ROS  The following portions of the patient's history were reviewed and updated as appropriate: allergies, current medications, past family history, past medical history, past social history, past surgical history and problem list. Problem list updated.   See flowsheet for other program required questions.  Objective:  There were no vitals filed for this visit.  Physical Exam Client declines further exam and testing at this time   Assessment and Plan:  Elizabeth Mata is a 58 y.o. female presenting to the [facility] Department for a Women's Health problem visit  1. Biological false positive RPR test Reviewed lab results dated 08/03/2019 RPR - reactive 1:1, however Flourescent Treponemal results: Non-reactive  Client counseled on above results.  2. Bacterial vaginosis Upon further review of results - noted + BV Due to several allergies - advised client to use the following medication - sent to pharmacy on file:  - Vaginal Lubricant ([place]) GEL; Place 1 Tube vaginally at bedtime as needed for up to 5 days - prn.  Dispense: 1 g; Refill: 0  Client verbalizes understanding and is in agreement with plan of care    No follow-ups on file.  Future Appointments  Date Time Provider [place]  10/04/2019 11:00 AM Hubbard Hartshorn, FNP [facility]  02/17/2020 11:20 AM [place] ADVISOR [place], NP

## 2019-08-09 NOTE — Progress Notes (Signed)
Reports in per [place] for Syphillis Tx; declines screening tests today Debera Lat, RN

## 2019-08-09 NOTE — Patient Instructions (Signed)

## 2019-08-09 NOTE — Telephone Encounter (Signed)
Pt is calling and stating she was told by Raelyn Ensign that she had syphilis  and needed to make an appt at the health dept. Pt made an appt and was seen today by the HD and was told she didn't have syphilis. Pt wish to speak to Raelyn Ensign concerning her wrong lab results she was given. ASAP

## 2019-08-09 NOTE — Telephone Encounter (Signed)
Spoke with patient.

## 2019-08-09 NOTE — Telephone Encounter (Signed)
Per Wilburn Mylar, FNP-call to pt. And informed due to medication allergies, may use Refresh gel OTC-per vagina at bedtime x 5-7 nights for symptoms.

## 2019-08-09 NOTE — Telephone Encounter (Signed)
Please advise I did see their note.  It looks like our testing was a false positive and their follow up testing confirmed that it was negative which is good news.  I am happy to give her a call at the end of the day, but please let her know false positives do occur sometimes, and this was the case for her.

## 2019-08-09 NOTE — Telephone Encounter (Signed)
Please let the patient know that I sent in naproxen (Rx strength aleve) as an alternative to the toradol as the pharmacy sent a note that the toradol is not covered.  This is meant to help with and side or back pain she may be experiencing based on our last visit.  If she is no longer having pain, she does not need to fill the medication or take it.

## 2019-08-09 NOTE — Telephone Encounter (Signed)
Please advise 

## 2019-08-10 ENCOUNTER — Telehealth: Payer: Self-pay | Admitting: General Practice

## 2019-08-10 LAB — NOVEL CORONAVIRUS, NAA: SARS-CoV-2, NAA: NOT DETECTED

## 2019-08-10 NOTE — Telephone Encounter (Signed)
Pt.notified

## 2019-08-10 NOTE — Telephone Encounter (Signed)
Negative COVID results given. Patient results "NOT Detected." Caller expressed understanding. ° °

## 2019-08-11 ENCOUNTER — Other Ambulatory Visit: Payer: Self-pay | Admitting: Nurse Practitioner

## 2019-08-12 NOTE — Telephone Encounter (Signed)
TC with patient.  Patient aware of false positive. Questions answered. Aileen Fass, RN

## 2019-08-15 ENCOUNTER — Other Ambulatory Visit: Payer: Self-pay | Admitting: Gastroenterology

## 2019-08-19 ENCOUNTER — Other Ambulatory Visit: Payer: Self-pay | Admitting: Family Medicine

## 2019-08-19 DIAGNOSIS — Z1231 Encounter for screening mammogram for malignant neoplasm of breast: Secondary | ICD-10-CM

## 2019-08-19 NOTE — Telephone Encounter (Signed)
Please review

## 2019-08-20 ENCOUNTER — Other Ambulatory Visit: Payer: Self-pay | Admitting: Gastroenterology

## 2019-08-26 ENCOUNTER — Other Ambulatory Visit: Payer: Self-pay | Admitting: Family Medicine

## 2019-08-26 DIAGNOSIS — Z23 Encounter for immunization: Secondary | ICD-10-CM | POA: Diagnosis not present

## 2019-08-31 ENCOUNTER — Telehealth: Payer: Self-pay

## 2019-08-31 ENCOUNTER — Telehealth: Payer: Self-pay | Admitting: Gastroenterology

## 2019-08-31 NOTE — Telephone Encounter (Signed)
Patient called back before I called the patient back informed patient on other telephone call of results

## 2019-08-31 NOTE — Telephone Encounter (Signed)
Called and left a message for call back to discuss results

## 2019-08-31 NOTE — Telephone Encounter (Signed)
-----   Message from Virgel Manifold, MD sent at 08/09/2019  4:26 PM EDT ----- Jackelyn Poling please let patient know, did not show any abnormalities to attribute to her pain. A small non obstructing kidney stone was seen. Labs looked normal as well. She should follow up with me in clinic if she is still having symptoms within 1-3 months.

## 2019-08-31 NOTE — Telephone Encounter (Signed)
Pt left vm for Caryl Pina to return her call

## 2019-08-31 NOTE — Telephone Encounter (Signed)
error 

## 2019-08-31 NOTE — Telephone Encounter (Signed)
Patient states she is still having the abdominal pain. Patient will follow up back with you on 09/13/2019 in our [place] location

## 2019-08-31 NOTE — Telephone Encounter (Signed)
From [place] note  ----- Message from Virgel Manifold, MD sent at 08/09/2019  4:26 PM EDT ----- Elizabeth Mata please let patient know, did not show any abnormalities to attribute to her pain. A small non obstructing kidney stone was seen. Labs looked normal as well. She should follow up with me in clinic if she is still having symptoms within 1-3 months.

## 2019-08-31 NOTE — Telephone Encounter (Signed)
Lmtco

## 2019-08-31 NOTE — Telephone Encounter (Signed)
Pt left vm she states she is returning Debbie's call for results

## 2019-09-13 ENCOUNTER — Encounter: Payer: Self-pay | Admitting: Gastroenterology

## 2019-09-13 ENCOUNTER — Ambulatory Visit (INDEPENDENT_AMBULATORY_CARE_PROVIDER_SITE_OTHER): Payer: Medicare Other | Admitting: Gastroenterology

## 2019-09-13 ENCOUNTER — Other Ambulatory Visit: Payer: Self-pay

## 2019-09-13 VITALS — BP 106/72 | HR 77 | Temp 97.6°F | Wt 134.5 lb

## 2019-09-13 DIAGNOSIS — R1084 Generalized abdominal pain: Secondary | ICD-10-CM | POA: Diagnosis not present

## 2019-09-13 MED ORDER — OMEPRAZOLE 20 MG PO CPDR: 20.0000 mg | DELAYED_RELEASE_CAPSULE | Freq: Two times a day (BID) | ORAL | 0 refills | Status: DC

## 2019-09-13 NOTE — Progress Notes (Signed)
Vonda Antigua, MD [address]  [place]  [place], [place] [address]  Main: [phone]  Fax: [phone]   Primary Care Physician: Hubbard Hartshorn, FNP   Chief Complaint  Patient presents with  . Abdominal Pain    Patient states the pain is unchanged. States pain is worse after she eats. she will have nausea constipation and diarrhea     HPI: Elizabeth Mata is a 58 y.o. female here for follow-up of abdominal pain.  Continues to complain of abdominal pain, diffusely, no nausea or vomiting.  Unrelated to meals, can occur with or without.  Dull, nonradiating, 5/10.  Reports one soft bowel movement daily without straining.  Has been prescribed Protonix that she took for 10 days and stopped taking it as it did not help.  Naproxen is listed on her medication list but she states she did not start this due to side effects listed on the bottle.  No blood in stool.  No weight loss.  No dysphagia.  She has undergone extensive work-up including recent CTA with no evidence of acute or chronic mesenteric ischemia  EGD June 2020 showed gastric erythema Colonoscopy June 2020 with 2 mm cecum polyp removedAnd random colon biopsies obtained  DIAGNOSIS:  A. STOMACH ERYTHEMA; COLD BIOPSY:  - ANTRAL MUCOSA WITH MILD REACTIVE GASTRITIS.  - MILDLY EDEMATOUS OXYNTIC MUCOSA.  - NEGATIVE FOR H. PYLORI, DYSPLASIA, AND MALIGNANCY.   B. COLON POLYP, CECUM; COLD BIOPSY:  - FRAGMENT OF MILDLY INFLAMED COLONIC MUCOSA (MILD ACTIVE COLITIS).  - DEEPER SECTIONS EXAMINED.  - NEGATIVE FOR ARCHITECTURAL FEATURES OF CHRONICITY, VIRAL CYTOPATHIC  EFFECT, MICROSCOPIC COLITIS, DYSPLASIA, AND MALIGNANCY.   C. COLON, RANDOM; COLD BIOPSY:  - MILD EDEMATOUR COLONIC MUCOSA WITH PATCHY MINIMAL TO MILD ACTIVE  COLITIS INVOLVING A MAJORITY OF THE BIOPSY FRAGMENTS, SEE COMMENT.  - FOCAL LYMPHOID AGGREGATE.  - NEGATIVE FOR ARCHITECTURAL FEATURES OF CHRONICITY, VIRAL CYTOPATHIC  EFFECT, MICROSCOPIC COLITIS,  DYSPLASIA, AND MALIGNANCY.   Comment:  Given the histologic findings in the colon biopsies (parts B and C) a  resolving self-limited colitis, likely infectious, is favored.   GI profile including C. difficile testing was negative.  Current Outpatient Medications  Medication Sig Dispense Refill  . aspirin EC 81 MG EC tablet Take 1 tablet (81 mg total) by mouth daily. 30 tablet 1  . atorvastatin (LIPITOR) 80 MG tablet Take 1 tablet (80 mg total) by mouth daily. 90 tablet 3  . fluticasone (FLONASE) 50 MCG/ACT nasal spray SPRAY 2 SPRAYS INTO EACH NOSTRIL EVERY DAY 48 mL 3  . naproxen (NAPROSYN) 500 MG tablet Take 1 tablet (500 mg total) by mouth 2 (two) times daily with a meal. 20 tablet 0  . topiramate (TOPAMAX) 200 MG tablet Take 200 mg by mouth 2 (two) times daily.   11  . omeprazole (PRILOSEC) 20 MG capsule Take 1 capsule (20 mg total) by mouth 2 (two) times daily before a meal. 30 capsule 0   No current facility-administered medications for this visit.     Allergies as of 09/13/2019 - Review Complete 09/13/2019  Allergen Reaction Noted  . Clindamycin/lincomycin Hives 11/10/2015  . Hydrocodone Nausea Only 03/01/2015  . Metronidazole Hives 11/07/2010  . Sulfa antibiotics Hives 07/05/2014  . Oxycodone Nausea And Vomiting 02/05/2017    ROS:  General: Negative for anorexia, weight loss, fever, chills, fatigue, weakness. ENT: Negative for hoarseness, difficulty swallowing , nasal congestion. CV: Negative for chest pain, angina, palpitations, dyspnea on exertion, peripheral edema.  Respiratory: Negative for dyspnea at rest, dyspnea on exertion, cough, sputum, wheezing.  GI: See history of present illness. GU:  Negative for dysuria, hematuria, urinary incontinence, urinary frequency, nocturnal urination.  Endo: Negative for unusual weight change.    Physical Examination:   BP 106/72 (BP Location: Left Arm, Patient Position: Sitting, Cuff Size: Normal)   Pulse 77   Temp 97.6 F  (36.4 C) (Tympanic)   Wt 134 lb 8 oz (61 kg)   BMI 27.17 kg/m   General: Well-nourished, well-developed in no acute distress.  Eyes: No icterus. Conjunctivae pink. Mouth: Oropharyngeal mucosa moist and pink , no lesions erythema or exudate. Neck: Supple, Trachea midline Abdomen: Bowel sounds are normal, nontender, nondistended, no hepatosplenomegaly or masses, no abdominal bruits or hernia , no rebound or guarding.   Extremities: No lower extremity edema. No clubbing or deformities. Neuro: Alert and oriented x 3.  Grossly intact. Skin: Warm and dry, no jaundice.   Psych: Alert and cooperative, normal mood and affect.   Labs: CMP     Component Value Date/Time   NA 141 05/18/2019 1137   NA 145 (H) 10/24/2015 1645   NA 144 12/31/2014 1450   K 3.7 05/18/2019 1137   K 3.6 12/31/2014 1450   CL 111 (H) 05/18/2019 1137   CL 113 (H) 12/31/2014 1450   CO2 20 05/18/2019 1137   CO2 21 12/31/2014 1450   GLUCOSE 90 05/18/2019 1137   GLUCOSE 107 (H) 12/31/2014 1450   BUN 11 05/18/2019 1137   BUN 11 10/24/2015 1645   BUN 17 12/31/2014 1450   CREATININE 0.92 05/18/2019 1137   CALCIUM 9.9 05/18/2019 1137   CALCIUM 9.4 12/31/2014 1450   PROT 6.5 07/21/2019 1535   PROT 7.8 05/03/2012 0903   ALBUMIN 4.4 07/21/2019 1535   ALBUMIN 3.8 05/03/2012 0903   AST 31 07/21/2019 1535   AST 30 05/03/2012 0903   ALT 34 (H) 07/21/2019 1535   ALT 25 05/03/2012 0903   ALKPHOS 84 07/21/2019 1535   ALKPHOS 88 05/03/2012 0903   BILITOT 0.4 07/21/2019 1535   BILITOT 0.6 05/03/2012 0903   GFRNONAA 70 05/18/2019 1137   GFRAA 81 05/18/2019 1137   Lab Results  Component Value Date   WBC 4.0 05/18/2019   HGB 14.0 05/18/2019   HCT 40.6 05/18/2019   MCV 93.3 05/18/2019   PLT 309 05/18/2019    Imaging Studies: No results found.  Assessment and Plan:   Elizabeth Mata is a 58 y.o. y/o female with abdominal pain  Extensive work-up so far has been unrevealing of any etiology of her pain Naproxen  was prescribed by her PCP but she has not started taking it.  I have discontinued asked her not to take it due to GI side effects with this medication.  Symptoms may be related to dyspepsia.  Will prescribe omeprazole twice daily as Protonix has not helped (Risks of PPI use were discussed with patient including bone loss, C. Diff diarrhea, pneumonia, infections, CKD, electrolyte abnormalities.  If clinically possible based on symptoms, goal would be to maintain patient on the lowest dose possible, or discontinue the medication with institution of acid reflux lifestyle modifications over time. Pt. Verbalizes understanding and chooses to continue the medication.)  If symptoms continue, may need to findings of colitis seen on biopsy only, to ensure these have resolved  Liver enzymes have improved on July 2020 work-up, with negative viral and autoimmune hepatitis work-up as well.  Continue to avoid hepatotoxic drugs.  We will repeat on future visit   Dr Vonda Antigua

## 2019-09-13 NOTE — Patient Instructions (Signed)
Please do not take the Pantoprazole and Nexium  We called in Omeprazole 20 mg twice a day   Gastroesophageal Reflux Disease, Adult Gastroesophageal reflux (GER) happens when acid from the stomach flows up into the tube that connects the mouth and the stomach (esophagus). Normally, food travels down the esophagus and stays in the stomach to be digested. With GER, food and stomach acid sometimes move back up into the esophagus. You may have a disease called gastroesophageal reflux disease (GERD) if the reflux:  Happens often.  Causes frequent or very bad symptoms.  Causes problems such as damage to the esophagus. When this happens, the esophagus becomes sore and swollen (inflamed). Over time, GERD can make small holes (ulcers) in the lining of the esophagus. What are the causes? This condition is caused by a problem with the muscle between the esophagus and the stomach. When this muscle is weak or not normal, it does not close properly to keep food and acid from coming back up from the stomach. The muscle can be weak because of:  Tobacco use.  Pregnancy.  Having a certain type of hernia (hiatal hernia).  Alcohol use.  Certain foods and drinks, such as coffee, chocolate, onions, and peppermint. What increases the risk? You are more likely to develop this condition if you:  Are overweight.  Have a disease that affects your connective tissue.  Use NSAID medicines. What are the signs or symptoms? Symptoms of this condition include:  Heartburn.  Difficult or painful swallowing.  The feeling of having a lump in the throat.  A bitter taste in the mouth.  Bad breath.  Having a lot of saliva.  Having an upset or bloated stomach.  Belching.  Chest pain. Different conditions can cause chest pain. Make sure you see your doctor if you have chest pain.  Shortness of breath or noisy breathing (wheezing).  Ongoing (chronic) cough or a cough at night.  Wearing away of the  surface of teeth (tooth enamel).  Weight loss. How is this treated? Treatment will depend on how bad your symptoms are. Your doctor may suggest:  Changes to your diet.  Medicine.  Surgery. Follow these instructions at home: Eating and drinking   Follow a diet as told by your doctor. You may need to avoid foods and drinks such as: ? Coffee and tea (with or without caffeine). ? Drinks that contain alcohol. ? Energy drinks and sports drinks. ? Bubbly (carbonated) drinks or sodas. ? Chocolate and cocoa. ? Peppermint and mint flavorings. ? Garlic and onions. ? Horseradish. ? Spicy and acidic foods. These include peppers, chili powder, curry powder, vinegar, hot sauces, and BBQ sauce. ? Citrus fruit juices and citrus fruits, such as oranges, lemons, and limes. ? Tomato-based foods. These include red sauce, chili, salsa, and pizza with red sauce. ? Fried and fatty foods. These include donuts, french fries, potato chips, and high-fat dressings. ? High-fat meats. These include hot dogs, rib eye steak, sausage, ham, and bacon. ? High-fat dairy items, such as whole milk, butter, and cream cheese.  Eat small meals often. Avoid eating large meals.  Avoid drinking large amounts of liquid with your meals.  Avoid eating meals during the 2-3 hours before bedtime.  Avoid lying down right after you eat.  Do not exercise right after you eat. Lifestyle   Do not use any products that contain nicotine or tobacco. These include cigarettes, e-cigarettes, and chewing tobacco. If you need help quitting, ask your doctor.  Try to  lower your stress. If you need help doing this, ask your doctor.  If you are overweight, lose an amount of weight that is healthy for you. Ask your doctor about a safe weight loss goal. General instructions  Pay attention to any changes in your symptoms.  Take over-the-counter and prescription medicines only as told by your doctor. Do not take aspirin, ibuprofen, or  other NSAIDs unless your doctor says it is okay.  Wear loose clothes. Do not wear anything tight around your waist.  Raise (elevate) the head of your bed about 6 inches (15 cm).  Avoid bending over if this makes your symptoms worse.  Keep all follow-up visits as told by your doctor. This is important. Contact a doctor if:  You have new symptoms.  You lose weight and you do not know why.  You have trouble swallowing or it hurts to swallow.  You have wheezing or a cough that keeps happening.  Your symptoms do not get better with treatment.  You have a hoarse voice. Get help right away if:  You have pain in your arms, neck, jaw, teeth, or back.  You feel sweaty, dizzy, or light-headed.  You have chest pain or shortness of breath.  You throw up (vomit) and your throw-up looks like blood or coffee grounds.  You pass out (faint).  Your poop (stool) is bloody or black.  You cannot swallow, drink, or eat. Summary  If a person has gastroesophageal reflux disease (GERD), food and stomach acid move back up into the esophagus and cause symptoms or problems such as damage to the esophagus.  Treatment will depend on how bad your symptoms are.  Follow a diet as told by your doctor.  Take all medicines only as told by your doctor. This information is not intended to replace advice given to you by your health care provider. Make sure you discuss any questions you have with your health care provider. Document Released: 06/03/2008 Document Revised: 06/24/2018 Document Reviewed: 06/24/2018 Elsevier Patient Education  2020 [employer].

## 2019-09-23 ENCOUNTER — Ambulatory Visit
Admission: RE | Admit: 2019-09-23 | Discharge: 2019-09-23 | Disposition: A | Discharge status: Home / Self Care | Payer: Medicare Other | Source: Ambulatory Visit | Attending: Family Medicine | Admitting: Family Medicine

## 2019-09-23 DIAGNOSIS — Z1231 Encounter for screening mammogram for malignant neoplasm of breast: Secondary | ICD-10-CM | POA: Diagnosis not present

## 2019-10-04 ENCOUNTER — Ambulatory Visit: Payer: Medicare Other | Admitting: Family Medicine

## 2019-10-28 ENCOUNTER — Ambulatory Visit: Payer: Medicare Other | Admitting: Gastroenterology

## 2020-02-17 ENCOUNTER — Ambulatory Visit: Payer: Medicare Other

## 2020-02-18 ENCOUNTER — Telehealth: Payer: Self-pay | Admitting: Family Medicine

## 2020-02-18 NOTE — Telephone Encounter (Signed)
Copied from [place] [id number]. Topic: General - Other >> Feb 18, 2020  8:40 AM Celene Kras wrote: Reason for CRM: Pt called stating that she has hives all over her body. There are no appts available and pt is requesting to have some medication sent in. Please advise.

## 2020-02-18 NOTE — Telephone Encounter (Signed)
Please place her on schedule for Monday at 3:30 for hives

## 2020-02-21 ENCOUNTER — Ambulatory Visit (INDEPENDENT_AMBULATORY_CARE_PROVIDER_SITE_OTHER): Payer: Medicare Other | Admitting: Family Medicine

## 2020-02-21 ENCOUNTER — Ambulatory Visit: Payer: Medicare Other | Admitting: Family Medicine

## 2020-02-21 ENCOUNTER — Other Ambulatory Visit: Payer: Self-pay

## 2020-02-21 ENCOUNTER — Encounter: Payer: Self-pay | Admitting: Family Medicine

## 2020-02-21 VITALS — BP 118/76 | HR 83 | Temp 98.1°F | Resp 14 | Ht 59.0 in | Wt 140.4 lb

## 2020-02-21 DIAGNOSIS — R350 Frequency of micturition: Secondary | ICD-10-CM

## 2020-02-21 DIAGNOSIS — L509 Urticaria, unspecified: Secondary | ICD-10-CM | POA: Diagnosis not present

## 2020-02-21 DIAGNOSIS — R102 Pelvic and perineal pain: Secondary | ICD-10-CM

## 2020-02-21 MED ORDER — MONTELUKAST SODIUM 10 MG PO TABS: 10.0000 mg | ORAL_TABLET | Freq: Every day | ORAL | 3 refills | Status: DC

## 2020-02-21 MED ORDER — HYDROXYZINE HCL 10 MG PO TABS: 10.0000 mg | ORAL_TABLET | Freq: Three times a day (TID) | ORAL | 1 refills | Status: DC | PRN

## 2020-02-21 MED ORDER — FEXOFENADINE HCL 180 MG PO TABS: 180.0000 mg | ORAL_TABLET | Freq: Two times a day (BID) | ORAL | 2 refills | Status: DC

## 2020-02-21 MED ORDER — FAMOTIDINE 20 MG PO TABS: 20.0000 mg | ORAL_TABLET | Freq: Two times a day (BID) | ORAL | 2 refills | Status: DC | PRN

## 2020-02-21 NOTE — Progress Notes (Signed)
Patient ID: Elizabeth Mata, female    DOB: 01-26-1961, 59 y.o.   MRN: [id number]  PCP: Delsa Grana, PA-C  Chief Complaint  Patient presents with  . Urticaria    onset 5 days all over body  . Groin Pain    left side, with walking, dull pressure pain    Subjective:   Elizabeth Mata is a 59 y.o. female, presents to clinic with CC of the following:  HPI  Hives started last week around Thursday/Friday are located all over trunk, back, arms, thighs groin and skin fold, some to face, taking benadryl several times a day does help a little bit but they return in a few hours.  She any new medications, tried any new foods or change any soaps detergents or lotions.  She has never had any symptoms like this before.,  She denies any swelling around her eyes or lips, denies sensation of throat closure, denies stridor wheeze, shortness of breath, chest tightness, chest pain, abdominal pain, nausea, vomiting, headache.  Patient also complains at the end of visit of left groin and pelvic pain stool gets worse with walking she denies any low back pain or past hip arthritis or problems, she does have some urinary frequency, and some pelvic floor weakening feels like pressure down there, she requests referral to gyn.  GI has previously worked up abd pain       Patient Active Problem List   Diagnosis Date Noted  . History of colonic polyps   . Polyp of ascending colon   . Hemorrhoids   . Stomach irritation   . Abdominal pain, epigastric   . Dizziness 10/27/2018  . Palpitations 10/27/2018  . Urinary urgency 04/21/2018  . Menopausal symptom 04/21/2018  . Urticaria 01/05/2018  . Carotid atherosclerosis, bilateral 11/06/2017  . TIA (transient ischemic attack) 10/28/2017  . Hx of completed stroke 10/28/2017  . Ataxia 10/08/2017  . Breast pain in female 07/11/2017  . Medication monitoring encounter 12/20/2016  . Abnormality of globulin 07/26/2016  . Neuropathy 07/24/2016  . Skin  pigmentation disorder 07/24/2016  . IFG (impaired fasting glucose) 07/18/2016  . Hematuria 07/03/2016  . Allergic rhinitis 06/30/2016  . Chronic left hip pain 06/28/2016  . Breast cancer screening 04/17/2016  . Skin texture changes 12/27/2015  . Vaginal discharge 10/31/2015  . Screening for STD (sexually transmitted disease) 10/31/2015  . Kidney stone on left side 10/24/2015  . Annual physical exam 09/13/2015  . Encounter for screening mammogram for malignant neoplasm of breast 09/13/2015  . Need for immunization against influenza 09/13/2015  . Eczema 09/13/2015  . Irritable bowel syndrome with constipation 09/13/2015  . Seizures ([place]) 08/16/2015  . Carpal tunnel syndrome 07/26/2014  . Carotid artery narrowing 07/05/2013  . 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  aspirin EC 81 MG EC tablet, Take 1 tablet (81 mg total) by mouth daily., Disp: 30 tablet, Rfl: 1 .  atorvastatin (LIPITOR) 80 MG tablet, Take 1 tablet (80 mg total) by mouth daily., Disp: 90 tablet, Rfl: 3 .  fluticasone (FLONASE) 50 MCG/ACT nasal spray, SPRAY 2 SPRAYS INTO EACH NOSTRIL EVERY DAY, Disp: 48 mL, Rfl: 3 .  naproxen (NAPROSYN) 500 MG tablet, Take 1 tablet (500 mg total) by mouth 2 (two) times daily with a meal., Disp: 20 tablet, Rfl: 0 .  omeprazole (PRILOSEC) 20 MG capsule, Take 1 capsule (20 mg total) by mouth 2 (two) times daily before a meal., Disp: 60  capsule, Rfl: 0 .  topiramate (TOPAMAX) 200 MG tablet, Take 200 mg by mouth 2 (two) times daily. , Disp: , Rfl: 11   Allergies  Allergen Reactions  . Clindamycin/Lincomycin Hives  . Hydrocodone Nausea Only  . Metronidazole Hives  . Sulfa Antibiotics Hives    Break out in hives all over  . Oxycodone Nausea And Vomiting     Family History  Problem Relation Age of Onset  . Coronary artery disease Maternal Uncle   . Diabetes Mellitus II Mother   . Arthritis Mother   . Diabetes Maternal Uncle   . Kidney cancer  Father 12       died April 27, 2017  . Kidney disease Father   . Cancer Father        kidney and bladder  . Arthritis Maternal Uncle   . Cancer Maternal Grandmother        stomach?  . Jaundice Maternal Grandmother   . Stomach cancer Paternal Grandmother   . Cancer Paternal Grandmother        stomach  . Hypertension Sister   . Stroke Maternal Grandfather   . Diabetes Paternal Grandfather   . Diabetes Sister   . Hypertension Sister   . Breast cancer Neg Hx   . Bladder Cancer Neg Hx      Social History   Socioeconomic History  . Marital status: Legally Separated    Spouse name: Not on file  . Number of children: 2  . Years of education: Not on file  . Highest education level: GED or equivalent  Occupational History  . Occupation: unemployed  Tobacco Use  . Smoking status: Never Smoker  . Smokeless tobacco: Never Used  Substance and Sexual Activity  . Alcohol use: Yes    Alcohol/week: 0.0 standard drinks    Comment: occ  . Drug use: Never  . Sexual activity: Yes    Partners: Male  Other Topics Concern  . Not on file  Social History Narrative   Regular exercise   Social Determinants of Health   Financial Resource Strain:   . Difficulty of Paying Living Expenses: Not on file  Food Insecurity:   . Worried About Charity fundraiser in the Last Year: Not on file  . Ran Out of Food in the Last Year: Not on file  Transportation Needs:   . Lack of Transportation (Medical): Not on file  . Lack of Transportation (Non-Medical): Not on file  Physical Activity:   . Days of Exercise per Week: Not on file  . Minutes of Exercise per Session: Not on file  Stress:   . Feeling of Stress : Not on file  Social Connections:   . Frequency of Communication with Friends and Family: Not on file  . Frequency of Social Gatherings with Friends and Family: Not on file  . Attends Religious Services: Not on file  . Active Member of Clubs or Organizations: Not on file  . Attends Theatre manager Meetings: Not on file  . Marital Status: Not on file  Intimate Partner Violence:   . Fear of Current or Ex-Partner: Not on file  . Emotionally Abused: Not on file  . Physically Abused: Not on file  . Sexually Abused: Not on file    Chart Review Today: I personally reviewed active problem list, medication list, allergies, family history, social history, health maintenance, notes from last encounter, lab results, imaging with the patient/caregiver today.   Review of Systems 10 Systems reviewed  and are negative for acute change except as noted in the HPI.     Objective:   Vitals:   02/21/20 1552  BP: 118/76  Pulse: 83  Resp: 14  Temp: 98.1 F (36.7 C)  SpO2: 98%  Weight: 140 lb 6.4 oz (63.7 kg)  Height: 4\' 11"  (1.499 m)    Body mass index is 28.36 kg/m.  Physical Exam Vitals and nursing note reviewed.  Constitutional:      Appearance: She is well-developed.  HENT:     Head: Normocephalic and atraumatic.     Right Ear: External ear normal.     Left Ear: External ear normal.     Nose: Nose normal. No congestion.     Mouth/Throat:     Mouth: Mucous membranes are moist.     Pharynx: Oropharynx is clear. No oropharyngeal exudate.     Comments: No intraoral swelling, normal uvula without edema, no stridor, normal trismus Eyes:     General:        Right eye: No discharge.        Left eye: No discharge.     Conjunctiva/sclera: Conjunctivae normal.  Neck:     Trachea: No tracheal deviation.  Cardiovascular:     Rate and Rhythm: Normal rate and regular rhythm.     Pulses: Normal pulses.     Heart sounds: No murmur. No friction rub. No gallop.   Pulmonary:     Effort: Pulmonary effort is normal. No respiratory distress.     Breath sounds: Normal breath sounds. No stridor. No wheezing, rhonchi or rales.  Abdominal:     General: Bowel sounds are normal. There is no distension.     Palpations: There is no mass.     Tenderness: There is no abdominal  tenderness. There is no right CVA tenderness or left CVA tenderness.  Musculoskeletal:        General: Normal range of motion.  Skin:    General: Skin is warm and dry.     Findings: Rash present.  Neurological:     Mental Status: She is alert.     Motor: No abnormal muscle tone.     Coordination: Coordination normal.  Psychiatric:        Mood and Affect: Mood normal.        Behavior: Behavior normal.      Results for orders placed or performed in visit on 08/09/19  Novel Coronavirus, NAA ([facility])   Specimen: Oropharyngeal(OP) collection in vial transport medium   OROPHARYNGEA  TESTING  Result Value Ref Range   SARS-CoV-2, NAA Not Detected Not Detected        Assessment & Plan:      ICD-10-CM   1. Hives  L50.9 fexofenadine (ALLEGRA) 180 MG tablet    famotidine (PEPCID) 20 MG tablet    montelukast (SINGULAIR) 10 MG tablet    hydrOXYzine (ATARAX/VISTARIL) 10 MG tablet  2. Pelvic pressure in female  R10.2 Ambulatory referral to Gynecology   left sided LLQ bladder and vaginal pressure  3. Urinary frequency  R35.0 Ambulatory referral to Gynecology   suspect OAB    Hives unclear etiology, new to patient, will treat aggressively with twice a day antihistamines, Pepcid, Singulair and as needed hydroxyzine we will have her follow-up with a specialist if not improving, instructions given to patient or below  Start twice a day for the next 2 weeks or so  Pepcid 20 mg 2x a day A antihistamine like allegra, claritin or zyrtec twice  a day Montelukast (Singulair) ONCE a day  Then use hydroxyzine 10-20 mg as needed up to three times a day  Patient requests a referral to a specialist for urinary frequency which I suspect is overactive bladder, and she reports left-sided bladder pressure and vaginal pain  She declined pelvic exam at this time and would like to follow-up with GYN instead    Delsa Grana, PA-C 02/21/20 4:16 PM

## 2020-02-21 NOTE — Patient Instructions (Signed)
Start twice a day for the next 2 weeks or so  Pepcid 20 mg 2x a day A antihistamine like allegra, claritin or zyrtec twice a dya Montelukast (Sinulair) ONCE a day  Then use hydroxyzine 10-20 mg as needed up to three times a day  Follow up if not improving in the next couple weeks   Hives Hives are itchy, red, swollen areas on your skin. Hives can show up on any part of your body. Hives often fade within 24 hours (acute hives). New hives can show up after old ones fade. This can go on for many days or weeks (chronic hives). Hives do not spread from person to person (are not contagious). Hives are caused by your body's response to something that you are allergic to (allergen). These are sometimes called triggers. You can get hives right after being around a trigger, or hours later. What are the causes?  Allergies to foods.  Insect bites or stings.  Pollen.  Pets.  Latex.  Chemicals.  Spending time in sunlight, heat, or cold.  Exercise.  Stress.  Some medicines.  Viruses. This includes the common cold.  Infections caused by germs (bacteria).  Allergy shots.  Blood transfusions. Sometimes, the cause is not known. What increases the risk?  Being a woman.  Being allergic to foods such as: ? Citrus fruits. ? Milk. ? Eggs. ? Peanuts. ? Tree nuts. ? Shellfish.  Being allergic to: ? Medicines. ? Latex. ? Insects. ? Animals. ? Pollen. What are the signs or symptoms?   Raised, itchy, red or white bumps or patches on your skin. These areas may: ? Get large and swollen. ? Change in shape and location. ? Stand alone or connect to each other over a large area of skin. ? Sting or hurt. ? Turn white when pressed in the center (blanch). In very bad cases, your hands, feet, and face may also get swollen. This may happen if hives start deeper in your skin. How is this treated? Treatment for this condition depends on your symptoms. Treatment may include:  Using  cool, wet cloths (cool compresses) or taking cool showers to stop the itching.  Medicines that help: ? Relieve itching (antihistamines). ? Reduce swelling (corticosteroids). ? Treat infection (antibiotics).  A medicine (omalizumab) that is given as a shot (injection). Your doctor may prescribe this if you have hives that do not get better even after other treatments.  In very bad cases, you may need a shot of a medicine called epinephrineto prevent a life-threatening allergic reaction (anaphylaxis). Follow these instructions at home: Medicines  Take or apply over-the-counter and prescription medicines only as told by your doctor.  If you were prescribed an antibiotic medicine, use it as told by your doctor. Do not stop using it even if you start to feel better. Skin care  Apply cool, wet cloths to the hives.  Do not scratch your skin. Do not rub your skin. General instructions  Do not take hot showers or baths. This can make itching worse.  Do not wear tight clothes.  Use sunscreen and wear clothes that cover your skin when you are outside.  Avoid any triggers that cause your hives. Keep a journal to help track what causes your hives. Write down: ? What medicines you take. ? What you eat and drink. ? What products you use on your skin.  Keep all follow-up visits as told by your doctor. This is important. Contact a doctor if:  Your symptoms are not  better with medicine.  Your joints hurt or are swollen. Get help right away if:  You have a fever.  You have pain in your belly (abdomen).  Your tongue or lips are swollen.  Your eyelids are swollen.  Your chest or throat feels tight.  You have trouble breathing or swallowing. These symptoms may be an emergency. Do not wait to see if the symptoms will go away. Get medical help right away. Call your local emergency services (911 in the [place] Do not drive yourself to the hospital. Summary  Hives are itchy, red, swollen  areas on your skin.  Treatment for this condition depends on your symptoms.  Avoid things that cause your hives. Keep a journal to help track what causes your hives.  Take and apply over-the-counter and prescription medicines only as told by your doctor.  Keep all follow-up visits as told by your doctor. This is important. This information is not intended to replace advice given to you by your health care provider. Make sure you discuss any questions you have with your health care provider. Document Revised: 07/01/2018 Document Reviewed: 07/01/2018 Elsevier Patient Education  [employer].

## 2020-02-25 ENCOUNTER — Encounter: Payer: Self-pay | Admitting: Family Medicine

## 2020-02-28 ENCOUNTER — Other Ambulatory Visit: Payer: Self-pay | Admitting: Family Medicine

## 2020-02-28 DIAGNOSIS — L509 Urticaria, unspecified: Secondary | ICD-10-CM

## 2020-03-03 ENCOUNTER — Ambulatory Visit (INDEPENDENT_AMBULATORY_CARE_PROVIDER_SITE_OTHER): Payer: Medicare Other | Admitting: Obstetrics & Gynecology

## 2020-03-03 ENCOUNTER — Other Ambulatory Visit: Payer: Self-pay

## 2020-03-03 ENCOUNTER — Encounter: Payer: Self-pay | Admitting: Obstetrics & Gynecology

## 2020-03-03 VITALS — BP 100/60 | Ht 59.0 in | Wt 139.0 lb

## 2020-03-03 DIAGNOSIS — Z90710 Acquired absence of both cervix and uterus: Secondary | ICD-10-CM

## 2020-03-03 DIAGNOSIS — R1032 Left lower quadrant pain: Secondary | ICD-10-CM | POA: Insufficient documentation

## 2020-03-03 DIAGNOSIS — N8189 Other female genital prolapse: Secondary | ICD-10-CM

## 2020-03-03 NOTE — Patient Instructions (Signed)
Transvaginal Ultrasound A transvaginal ultrasound, also called an endovaginal ultrasound, is a test that uses sound waves to take pictures of the female genital tract. The pictures are taken with a device, called a transducer, that is placed in the vagina. This test may be done to:  Check for problems with your pregnancy.  Check your developing baby.  Check for anything abnormal in the uterus or ovaries.  Find out why you have pelvic pain or bleeding. Tell a health care provider about:  Any allergies you have.  All medicines you are taking, including vitamins, herbs, eye drops, creams, and over-the-counter medicines.  Any blood disorders you have.  Any surgeries you have had.  Any medical conditions you have.  Whether you are pregnant or may be pregnant.  Whether you are having your menstrual period. What are the risks? This is a safe procedure. There are no known risks or complications of having this test. What happens before the procedure? This procedure needs to be done when your bladder is empty. Follow your health care provider's instructions about drinking fluids and emptying your bladder before the test. What happens during the procedure?   You will empty your bladder before the procedure.  You will undress from the waist down.  You will lie down on an exam table, with your knees bent and your feet in foot holders.  A health care provider will cover the transducer with a sterile cover.  A gel will be put on the transducer. The gel helps transmit the sound waves and prevents irritation of your vagina.  The technician will insert the transducer into your vagina to get images. These will be displayed on a monitor that looks like a small television screen.  The transducer will be removed when the procedure is complete. The procedure may vary among health care providers and hospitals. What happens after the procedure?  It is up to you to get the results of your  procedure. Ask your health care provider, or the department that is doing the procedure, when your results will be ready.  Keep all follow-up visits as told by your health care provider. This is important. Summary  A transvaginal ultrasound, also called an endovaginal ultrasound, is a test that uses sound waves to take pictures of the female genital tract.  This is a safe procedure. There are no known risks associated with this test.  The procedure needs to be done when your bladder is empty. Follow your health care provider's instructions about drinking fluids and emptying your bladder before the test.  During the procedure, you will undress from the waist down and lie down on an exam table. A technician will insert a transducer into your vagina to obtain images.  Ask your health care provider, or the department that is doing the procedure, when your results will be ready. This information is not intended to replace advice given to you by your health care provider. Make sure you discuss any questions you have with your health care provider. Document Revised: 07/29/2018 Document Reviewed: 07/29/2018 Elsevier Patient Education  2020 Elsevier Inc.  

## 2020-03-03 NOTE — Progress Notes (Addendum)
Gynecology Pelvic Pain Evaluation  Consultant: Delsa Grana, PA-C Consulting Reason: Pelvic pressure Chief Complaint  Patient presents with  . pelvic pressure  . Urinary Frequency    History of Present Illness:   Patient is a 59 y.o. G2P2 who LMP was No LMP recorded. Patient has had a hysterectomy., presents today for a problem visit.  She complains of pain and urinary symptoms of urinary urgency.   Her pain is localized to the LLQ and vaginal area, described as intermittent and aching, began about a month ago and its severity is described as moderate. The pain radiates to the  Non-radiating. She has these associated symptoms which include urgency and occas small amounts of urinary incontinence, some bloating; describes pressure sensation vaginally; denies frequency, nocturia, LOU w activities.  Patient has these modifiers which include nothing that make it better and unable to associate with any factor that make it worse.  Context includes: Prio TVH years ago (endometriosis), Prior NSVD x2; still has ovaries.   Previous evaluation: none. Prior Diagnosis: none. Previous Treatment: none.  PMHx: She  has a past medical history of Allergic rhinitis (06/30/2016), Allergy, Carotid atherosclerosis, bilateral (11/06/2017), Cyst of breast, left, solitary, Flank pain, GERD (gastroesophageal reflux disease), Hemorrhoid, Hyperlipidemia, IFG (impaired fasting glucose) (07/18/2016), Kidney stones, Neuropathy (07/24/2016), Seizures ([place]), Shingles outbreak (02/03/2017), Syncope, and Trigger thumb. Also,  has a past surgical history that includes Foot surgery (Right); Cholecystectomy (31); Abdominal hysterectomy (40s); Breast surgery; Esophagogastroduodenoscopy (egd) with propofol (N/A, 06/23/2019); Colonoscopy with propofol (N/A, 06/23/2019); and Breast excisional biopsy (Left, 1980'S)., family history includes Arthritis in her maternal uncle and mother; Cancer in her father, maternal grandmother, and paternal  grandmother; Coronary artery disease in her maternal uncle; Diabetes in her maternal uncle, paternal grandfather, and sister; Diabetes Mellitus II in her mother; Hypertension in her sister and sister; Jaundice in her maternal grandmother; Kidney cancer (age of onset: 77) in her father; Kidney disease in her father; Stomach cancer in her paternal grandmother; Stroke in her maternal grandfather.,  reports that she has never smoked. She has never used smokeless tobacco. She reports current alcohol use. She reports that she does not use drugs.  She has a current medication list which includes the following prescription(s): aspirin, atorvastatin, famotidine, fexofenadine, fluticasone, hydroxyzine, montelukast, naproxen, omeprazole, and topiramate. Also, is allergic to clindamycin/lincomycin; hydrocodone; metronidazole; sulfa antibiotics; and oxycodone.  Review of Systems  Constitutional: Positive for malaise/fatigue. Negative for chills and fever.  HENT: Negative for congestion, sinus pain and sore throat.   Eyes: Negative for blurred vision and pain.  Respiratory: Negative for cough and wheezing.   Cardiovascular: Negative for chest pain and leg swelling.  Gastrointestinal: Positive for abdominal pain and constipation. Negative for diarrhea, heartburn, nausea and vomiting.  Genitourinary: Positive for frequency and urgency. Negative for dysuria and hematuria.  Musculoskeletal: Negative for back pain, joint pain, myalgias and neck pain.  Skin: Negative for itching and rash.  Neurological: Negative for dizziness, tremors and weakness.  Endo/Heme/Allergies: Does not bruise/bleed easily.  Psychiatric/Behavioral: Negative for depression. The patient is not nervous/anxious and does not have insomnia.     Objective: BP 100/60   Ht 4\' 11"  (1.499 m)   Wt 139 lb (63 kg)   BMI 28.07 kg/m  Physical Exam Constitutional:      General: She is not in acute distress.    Appearance: She is well-developed.    Genitourinary:     Pelvic exam was performed with patient supine.     Urethra, bladder,  vagina and rectum normal.     No lesions in the vagina.     No vaginal bleeding.     Cervix is absent.     Uterus is absent.     No right or left adnexal mass present.     Right adnexa not tender.     Left adnexa tender.     Genitourinary Comments: Absent Uterus Absent cervix Vaginal cuff well healed, some distal prolapse of vag tissue, min cystocele present <30 degree q tip test   HENT:     Head: Normocephalic and atraumatic. No laceration.     Right Ear: Hearing normal.     Left Ear: Hearing normal.     Mouth/Throat:     Pharynx: Uvula midline.  Eyes:     Pupils: Pupils are equal, round, and reactive to light.  Neck:     Thyroid: No thyromegaly.  Cardiovascular:     Rate and Rhythm: Normal rate and regular rhythm.     Heart sounds: No murmur. No friction rub. No gallop.   Pulmonary:     Effort: Pulmonary effort is normal. No respiratory distress.     Breath sounds: Normal breath sounds. No wheezing.  Abdominal:     General: Bowel sounds are normal. There is no distension.     Palpations: Abdomen is soft.     Tenderness: There is no abdominal tenderness. There is no rebound.  Musculoskeletal:        General: Normal range of motion.     Cervical back: Normal range of motion and neck supple.  Neurological:     Mental Status: She is alert and oriented to person, place, and time.     Cranial Nerves: No cranial nerve deficit.  Skin:    General: Skin is warm and dry.  Psychiatric:        Judgment: Judgment normal.  Vitals reviewed.   Female chaperone present for pelvic portion of the physical exam  Assessment: 59 y.o. G2P2 with Pain and sx's of early prolapse (prior hyst) with exam findings showing min cystocele just distal vaginal weakening.  Also, Pain and concern for bloating, will check ovaries by TVUS..  1. Pelvic floor weakness in female Kegels counseled and educated to pt  for long term prevention of further weakening  2. LLQ pain - Tylenol, rest - May be MS in nature as pain only for the last month (yet urgency for many months if not years). - US PELVIC COMPLETE WITH TRANSVAGINAL; Future to assess ovaries     Barnett Applebaum, MD, [facility], [facility] 03/03/2020  8:52 AM

## 2020-03-23 ENCOUNTER — Encounter: Payer: Self-pay | Admitting: Obstetrics & Gynecology

## 2020-03-23 ENCOUNTER — Other Ambulatory Visit: Payer: Self-pay | Admitting: Obstetrics & Gynecology

## 2020-03-23 ENCOUNTER — Ambulatory Visit (INDEPENDENT_AMBULATORY_CARE_PROVIDER_SITE_OTHER): Payer: Medicare Other

## 2020-03-23 ENCOUNTER — Other Ambulatory Visit: Payer: Self-pay

## 2020-03-23 ENCOUNTER — Ambulatory Visit (INDEPENDENT_AMBULATORY_CARE_PROVIDER_SITE_OTHER): Payer: Medicare Other | Admitting: Obstetrics & Gynecology

## 2020-03-23 VITALS — BP 100/60 | Ht 59.0 in | Wt 138.0 lb

## 2020-03-23 DIAGNOSIS — R1032 Left lower quadrant pain: Secondary | ICD-10-CM

## 2020-03-23 DIAGNOSIS — R102 Pelvic and perineal pain: Secondary | ICD-10-CM | POA: Diagnosis not present

## 2020-03-23 DIAGNOSIS — Z90710 Acquired absence of both cervix and uterus: Secondary | ICD-10-CM | POA: Diagnosis not present

## 2020-03-23 NOTE — Progress Notes (Signed)
  HPI: Pt has been having left sided pain, and it has been improving over the last week.  [place] today to assess ovaries (prior hyst) as any etiology towards her pains.  Debnies recent weight changes and bloating.  Ultrasound demonstrates no masses seen These findings are Pelvis normal  PMHx: She  has a past medical history of Allergic rhinitis (06/30/2016), Allergy, Carotid atherosclerosis, bilateral (11/06/2017), Cyst of breast, left, solitary, Flank pain, GERD (gastroesophageal reflux disease), Hemorrhoid, Hyperlipidemia, IFG (impaired fasting glucose) (07/18/2016), Kidney stones, Neuropathy (07/24/2016), Seizures ([place]), Shingles outbreak (02/03/2017), Syncope, and Trigger thumb. Also,  has a past surgical history that includes Foot surgery (Right); Cholecystectomy (31); Abdominal hysterectomy (40s); Breast surgery; Esophagogastroduodenoscopy (egd) with propofol (N/A, 06/23/2019); Colonoscopy with propofol (N/A, 06/23/2019); and Breast excisional biopsy (Left, 1980'S)., family history includes Arthritis in her maternal uncle and mother; Cancer in her father, maternal grandmother, and paternal grandmother; Coronary artery disease in her maternal uncle; Diabetes in her maternal uncle, paternal grandfather, and sister; Diabetes Mellitus II in her mother; Hypertension in her sister and sister; Jaundice in her maternal grandmother; Kidney cancer (age of onset: 41) in her father; Kidney disease in her father; Stomach cancer in her paternal grandmother; Stroke in her maternal grandfather.,  reports that she has never smoked. She has never used smokeless tobacco. She reports current alcohol use. She reports that she does not use drugs.  She has a current medication list which includes the following prescription(s): aspirin, atorvastatin, famotidine, fexofenadine, fluticasone, hydroxyzine, montelukast, naproxen, omeprazole, and topiramate. Also, is allergic to clindamycin/lincomycin; hydrocodone; metronidazole; sulfa  antibiotics; and oxycodone.  Review of Systems  All other systems reviewed and are negative.   Objective: BP 100/60   Ht 4\' 11"  (1.499 m)   Wt 138 lb (62.6 kg)   BMI 27.87 kg/m   Physical examination Constitutional NAD, Conversant  Skin No rashes, lesions or ulceration.   Extremities: Moves all appropriately.  Normal ROM for age. No lymphadenopathy.  Neuro: Grossly intact  Psych: Oriented to PPT.  Normal mood. Normal affect.   US PELVIS TRANSVAGINAL NON-OB (TV ONLY)  Result Date: 03/23/2020 Patient Name: Elizabeth Mata DOB: 04/08/1961 MRN: [id number] ULTRASOUND REPORT Location: [facility] Date of Service: 03/23/2020 Indications:Pelvic Pain Findings: The uterus and cervix are absent. Right Ovary measures 2.1 x 1.4 x 1.0 cm. It is normal in appearance. Left Ovary measures 3.5 x 2.3 x 1.8 cm. It is normal in appearance. Survey of the adnexa demonstrates no adnexal masses. There is no free fluid in the cul de sac. Impression: 1. Total hysterectomy noted. 2. Normal appearing ovaries. Recommendations: 1.Clinical correlation with the patient's History and Physical Exam. Gweneth Dimitri, RT Review of ULTRASOUND.    I have personally reviewed images and report of recent ultrasound done at [facility].    Plan of management to be discussed with patient. Barnett Applebaum, MD, [facility], [facility] 03/23/2020  10:58 AM   Assessment:  LLQ pain  Options for pain monitoring and investigation discussed Kegels advised for pelvic floor weakening F/U PRN and for annual when due  A total of 20 minutes were spent face-to-face with the patient as well as preparation, review, communication, and documentation during this encounter.   Barnett Applebaum, MD, [facility], [facility] 03/23/2020  11:08 AM

## 2020-04-06 ENCOUNTER — Telehealth: Payer: Self-pay | Admitting: Family Medicine

## 2020-04-06 NOTE — Telephone Encounter (Signed)
Pt states is doing and will schedule an appt

## 2020-04-06 NOTE — Telephone Encounter (Signed)
Pt called stating that she is still dealing with hives and the medication she was prescribed doesn't seem to be helping. Please advise.      [id number] - [place], [place] - [address]  [address] [place] [address]  Phone: [phone] Fax: [phone]  Not a 24 hour pharmacy; exact hours not known.

## 2020-04-06 NOTE — Telephone Encounter (Signed)
Does she need to come in again?, or can you do rx for something else or referral?

## 2020-04-12 ENCOUNTER — Ambulatory Visit (INDEPENDENT_AMBULATORY_CARE_PROVIDER_SITE_OTHER): Payer: Medicare Other | Admitting: Family Medicine

## 2020-04-12 ENCOUNTER — Other Ambulatory Visit: Payer: Self-pay

## 2020-04-12 ENCOUNTER — Encounter: Payer: Self-pay | Admitting: Family Medicine

## 2020-04-12 VITALS — BP 112/82 | HR 82 | Temp 97.8°F | Resp 14 | Ht 59.0 in | Wt 134.8 lb

## 2020-04-12 DIAGNOSIS — L509 Urticaria, unspecified: Secondary | ICD-10-CM | POA: Diagnosis not present

## 2020-04-12 MED ORDER — PREDNISONE 10 MG PO TABS: ORAL_TABLET | ORAL | 0 refills | Status: DC

## 2020-04-12 NOTE — Patient Instructions (Signed)
You can try a different daily allergy pill like zyrtec, claritin or xyzal - one time daily to twice daily You can try taking zantac BID 150 mg instead of pepcid  You can take benadryl 12.5 - 50 mg by mouth up to 4-6 times a day (300 mg a day max)  I would keep taking singulair if you are tolerating it  You can do the steroids  Follow up with the allergy specialist   Hives Hives are itchy, red, swollen areas on your skin. Hives can show up on any part of your body. Hives often fade within 24 hours (acute hives). New hives can show up after old ones fade. This can go on for many days or weeks (chronic hives). Hives do not spread from person to person (are not contagious). Hives are caused by your body's response to something that you are allergic to (allergen). These are sometimes called triggers. You can get hives right after being around a trigger, or hours later. What are the causes?  Allergies to foods.  Insect bites or stings.  Pollen.  Pets.  Latex.  Chemicals.  Spending time in sunlight, heat, or cold.  Exercise.  Stress.  Some medicines.  Viruses. This includes the common cold.  Infections caused by germs (bacteria).  Allergy shots.  Blood transfusions. Sometimes, the cause is not known. What increases the risk?  Being a woman.  Being allergic to foods such as: ? Citrus fruits. ? Milk. ? Eggs. ? Peanuts. ? Tree nuts. ? Shellfish.  Being allergic to: ? Medicines. ? Latex. ? Insects. ? Animals. ? Pollen. What are the signs or symptoms?   Raised, itchy, red or white bumps or patches on your skin. These areas may: ? Get large and swollen. ? Change in shape and location. ? Stand alone or connect to each other over a large area of skin. ? Sting or hurt. ? Turn white when pressed in the center (blanch). In very bad cases, your hands, feet, and face may also get swollen. This may happen if hives start deeper in your skin. How is this  treated? Treatment for this condition depends on your symptoms. Treatment may include:  Using cool, wet cloths (cool compresses) or taking cool showers to stop the itching.  Medicines that help: ? Relieve itching (antihistamines). ? Reduce swelling (corticosteroids). ? Treat infection (antibiotics).  A medicine (omalizumab) that is given as a shot (injection). Your doctor may prescribe this if you have hives that do not get better even after other treatments.  In very bad cases, you may need a shot of a medicine called epinephrineto prevent a life-threatening allergic reaction (anaphylaxis). Follow these instructions at home: Medicines  Take or apply over-the-counter and prescription medicines only as told by your doctor.  If you were prescribed an antibiotic medicine, use it as told by your doctor. Do not stop using it even if you start to feel better. Skin care  Apply cool, wet cloths to the hives.  Do not scratch your skin. Do not rub your skin. General instructions  Do not take hot showers or baths. This can make itching worse.  Do not wear tight clothes.  Use sunscreen and wear clothes that cover your skin when you are outside.  Avoid any triggers that cause your hives. Keep a journal to help track what causes your hives. Write down: ? What medicines you take. ? What you eat and drink. ? What products you use on your skin.  Keep all  follow-up visits as told by your doctor. This is important. Contact a doctor if:  Your symptoms are not better with medicine.  Your joints hurt or are swollen. Get help right away if:  You have a fever.  You have pain in your belly (abdomen).  Your tongue or lips are swollen.  Your eyelids are swollen.  Your chest or throat feels tight.  You have trouble breathing or swallowing. These symptoms may be an emergency. Do not wait to see if the symptoms will go away. Get medical help right away. Call your local emergency services  (911 in the [place] Do not drive yourself to the hospital. Summary  Hives are itchy, red, swollen areas on your skin.  Treatment for this condition depends on your symptoms.  Avoid things that cause your hives. Keep a journal to help track what causes your hives.  Take and apply over-the-counter and prescription medicines only as told by your doctor.  Keep all follow-up visits as told by your doctor. This is important. This information is not intended to replace advice given to you by your health care provider. Make sure you discuss any questions you have with your health care provider. Document Revised: 07/01/2018 Document Reviewed: 07/01/2018 Elsevier Patient Education  [place].

## 2020-04-12 NOTE — Progress Notes (Signed)
Patient ID: Elizabeth Mata, female    DOB: September 13, 1961, 59 y.o.   MRN: [id number]  PCP: Delsa Grana, PA-C  Chief Complaint  Patient presents with  . Urticaria    recurrent not going away    Subjective:   Elizabeth Mata is a 59 y.o. female, presents to clinic with CC of the following: Recurrent Hives over the past 2 months, she has had several times in the past and has history of multiple drug allergies which have caused rash and hives.  I did see and evaluated her about 2 months ago for same, we did try doing twice a day second-generation antihistamines, adding Pepcid, adding Singulair and using lower dose hydroxyzine as needed for severe itching or spreading rash.  She was encouraged to follow-up and had recently sent a message through my chart stating that she had not had any improvement yet and she wanted steroids.  She is come today to follow-up on this continued complaint.  She states that her rash continues to be persistent is located all over her body, is waking her from sleep with severe itching, sometimes she states that it itches around her lips and ears.  She reports that she has tried all these measures, she complains of side effects of the medications and it causes her to be sleepy and she is too busy to have any sedation as a side effect.  She would like steroids she states she has had it in the past which she takes 5 pills 1 day then 4 pills the next day etc. and it made it go away and she never had a problem again.  She does not believe that there has been any medication changes in the past several months no changes to soaps detergents lotions and no suspected food exposure.  Today she shows me very faint small red area on her left forearm and there is no other visible areas of rash.  She denies any lip swelling, stridor, wheeze, shortness of breath, nausea, vomiting.  She is very frustrated with taking multiple medications and having no improvement   Patient Active  Problem List   Diagnosis Date Noted  . Pelvic floor weakness in female 03/03/2020  . LLQ pain 03/03/2020  . History of colonic polyps   . Polyp of ascending colon   . Hemorrhoids   . Stomach irritation   . Abdominal pain, epigastric   . Dizziness 10/27/2018  . Palpitations 10/27/2018  . Urinary urgency 04/21/2018  . Menopausal symptom 04/21/2018  . Urticaria 01/05/2018  . Carotid atherosclerosis, bilateral 11/06/2017  . TIA (transient ischemic attack) 10/28/2017  . Hx of completed stroke 10/28/2017  . Ataxia 10/08/2017  . Breast pain in female 07/11/2017  . Medication monitoring encounter 12/20/2016  . Abnormality of globulin 07/26/2016  . Neuropathy 07/24/2016  . 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 IFG (impaired fasting glucose) 07/18/2016  . Hematuria 07/03/2016  . Allergic rhinitis 06/30/2016  . Chronic left hip pain 06/28/2016  . Breast cancer screening 04/17/2016  . Skin texture changes 12/27/2015  . Vaginal discharge 10/31/2015  . Screening for STD (sexually transmitted disease) 10/31/2015  . Kidney stone on left side 10/24/2015  . Annual physical exam 09/13/2015  . Encounter for screening mammogram for malignant neoplasm of breast 09/13/2015  . Need for immunization against influenza 09/13/2015  . Eczema 09/13/2015  . Irritable bowel syndrome with constipation 09/13/2015  . Seizures ([place]) 08/16/2015  . Carpal tunnel syndrome 07/26/2014  . Carotid artery narrowing  07/05/2013  . 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  aspirin EC 81 MG EC tablet, Take 1 tablet (81 mg total) by mouth daily., Disp: 30 tablet, Rfl: 1 .  atorvastatin (LIPITOR) 80 MG tablet, Take 1 tablet (80 mg total) by mouth daily., Disp: 90 tablet, Rfl: 3 .  famotidine (PEPCID) 20 MG tablet, Take 1 tablet (20 mg total) by mouth 2 (two) times daily as needed., Disp: 60 tablet, Rfl: 2 .  fexofenadine (ALLEGRA) 180 MG tablet, Take 1 tablet (180 mg total) by  mouth in the morning and at bedtime. (higher dose for hives), Disp: 60 tablet, Rfl: 2 .  fluticasone (FLONASE) 50 MCG/ACT nasal spray, SPRAY 2 SPRAYS INTO EACH NOSTRIL EVERY DAY, Disp: 48 mL, Rfl: 3 .  hydrOXYzine (ATARAX/VISTARIL) 10 MG tablet, Take 1-2 tablets (10-20 mg total) by mouth 3 (three) times daily as needed for itching (hives)., Disp: 90 tablet, Rfl: 1 .  montelukast (SINGULAIR) 10 MG tablet, Take 1 tablet (10 mg total) by mouth at bedtime., Disp: 30 tablet, Rfl: 3 .  naproxen (NAPROSYN) 500 MG tablet, Take 1 tablet (500 mg total) by mouth 2 (two) times daily with a meal., Disp: 20 tablet, Rfl: 0 .  omeprazole (PRILOSEC) 20 MG capsule, Take 1 capsule (20 mg total) by mouth 2 (two) times daily before a meal., Disp: 60 capsule, Rfl: 0 .  topiramate (TOPAMAX) 200 MG tablet, Take 200 mg by mouth 2 (two) times daily. , Disp: , Rfl: 11   Allergies  Allergen Reactions  . Clindamycin/Lincomycin Hives  . Hydrocodone Nausea Only  . Metronidazole Hives  . Sulfa Antibiotics Hives    Break out in hives all over  . Oxycodone Nausea And Vomiting     Family History  Problem Relation Age of Onset  . Coronary artery disease Maternal Uncle   . Diabetes Mellitus II Mother   . Arthritis Mother   . Diabetes Maternal Uncle   . Kidney cancer Father 75       died May 09, 2017  . Kidney disease Father   . Cancer Father        kidney and bladder  . Arthritis Maternal Uncle   . Cancer Maternal Grandmother        stomach?  . Jaundice Maternal Grandmother   . Stomach cancer Paternal Grandmother   . Cancer Paternal Grandmother        stomach  . Hypertension Sister   . Stroke Maternal Grandfather   . Diabetes Paternal Grandfather   . Diabetes Sister   . Hypertension Sister   . Breast cancer Neg Hx   . Bladder Cancer Neg Hx      Social History   Socioeconomic History  . Marital status: Legally Separated    Spouse name: Not on file  . Number of children: 2  . Years of education: Not  on file  . Highest education level: GED or equivalent  Occupational History  . Occupation: unemployed  Tobacco Use  . Smoking status: Never Smoker  . Smokeless tobacco: Never Used  Substance and Sexual Activity  . Alcohol use: Yes    Alcohol/week: 0.0 standard drinks    Comment: occ  . Drug use: Never  . Sexual activity: Yes    Partners: Male  Other Topics Concern  . Not on file  Social History Narrative   Regular exercise   Social Determinants of Health   Financial Resource Strain:   . Difficulty  of Paying Living Expenses:   Food Insecurity:   . Worried About Charity fundraiser in the Last Year:   . Arboriculturist in the Last Year:   Transportation Needs:   . Film/video editor (Medical):   Marland Kitchen Lack of Transportation (Non-Medical):   Physical Activity:   . Days of Exercise per Week:   . Minutes of Exercise per Session:   Stress:   . Feeling of Stress :   Social Connections:   . Frequency of Communication with Friends and Family:   . Frequency of Social Gatherings with Friends and Family:   . Attends Religious Services:   . Active Member of Clubs or Organizations:   . Attends Archivist Meetings:   Marland Kitchen Marital Status:   Intimate Partner Violence:   . Fear of Current or Ex-Partner:   . Emotionally Abused:   Marland Kitchen Physically Abused:   . Sexually Abused:     Chart Review Today: I personally reviewed active problem list, medication list, allergies, family history, social history, health maintenance, notes from last encounter, lab results, imaging with the patient/caregiver today.   Review of Systems 10 Systems reviewed and are negative for acute change except as noted in the HPI.     Objective:   Vitals:   04/12/20 1058  BP: 112/82  Pulse: 82  Resp: 14  Temp: 97.8 F (36.6 C)  Weight: 134 lb 12.8 oz (61.1 kg)  Height: 4\' 11"  (1.499 m)    Body mass index is 27.23 kg/m.  Physical Exam Vitals and nursing note reviewed.  Constitutional:       General: She is not in acute distress.    Appearance: Normal appearance. She is well-developed. She is not ill-appearing, toxic-appearing or diaphoretic.  HENT:     Head: Normocephalic and atraumatic. No right periorbital erythema or left periorbital erythema.     Jaw: There is normal jaw occlusion.     Comments: No swelling to lips face around eyes no rash noted to face    Right Ear: External ear normal.     Left Ear: External ear normal.     Nose: Nose normal. No mucosal edema, congestion or rhinorrhea.     Mouth/Throat:     Lips: Pink. No lesions.     Mouth: Mucous membranes are moist.     Pharynx: Oropharynx is clear. Uvula midline. No pharyngeal swelling, oropharyngeal exudate, posterior oropharyngeal erythema or uvula swelling.     Comments: No stridor, airway patent Eyes:     General:        Right eye: No discharge.        Left eye: No discharge.     Conjunctiva/sclera: Conjunctivae normal.  Neck:     Trachea: No tracheal deviation.  Cardiovascular:     Rate and Rhythm: Normal rate and regular rhythm.     Pulses: Normal pulses.     Heart sounds: Normal heart sounds.  Pulmonary:     Effort: Pulmonary effort is normal. No respiratory distress.     Breath sounds: Normal breath sounds. No stridor. No wheezing, rhonchi or rales.  Musculoskeletal:        General: Normal range of motion.  Skin:    General: Skin is warm and dry.     Comments: Very very faint half centimeter area of possible erythema to left forearm not raised, no excoriations All of the visible skin to upper chest and all extremities is normal appearing appears well-hydrated  Neurological:  Mental Status: She is alert.     Motor: No abnormal muscle tone.     Coordination: Coordination normal.  Psychiatric:        Mood and Affect: Affect normal.        Behavior: Behavior is agitated.            Assessment & Plan:      ICD-10-CM   1. Urticaria  L50.9 predniSONE (DELTASONE) 10 MG tablet     Ambulatory referral to Allergy    Patient presents for follow-up on urticaria or hives that first presented a little less than 2 months ago.  She is very frustrated and slightly angry today.  She is upset about taking multiple pills with no significant improvement.  She is adamant that she wants a steroid taper to treat stating that this is worked in the past.   I reviewed the chart with her extensively including other visits with providers at the same clinic with seeing her for the same complaint she was previously treated with Zantac and higher doses of hydroxyzine, the visit where she stated that Dr. Sanda Klein gave her steroids I could not find.  She does have history of several food and drug allergies which do cause rash/hives  I explained that usually with chronic idiopathic hives steroids are not indicated but I would prescribe them for her and she could try and see if it improves her symptoms.  She wanted 5 days worth explained that with 2 months of rash this would likely cause rebound dermatitis/rebound rash and I explained that a longer taper would likely be more effective and prevent rebound  I again explained all the medications, mechanism of action and purpose why I had put her on them.  She does not want to take prescription medications so a took most of them off of her chart.  Encouraged her to try changing her Allegra to a different second-generation antihistamine such as Xyzal Zyrtec or Claritin which she can get over-the-counter encouraged her to take 1-2 times a day.  She previously tried Pepcid 2 months ago and stated that did nothing for her, in the past when she saw Raelyn Ensign Zantac seemed to have worked for her rash so encouraged her to switch to that.  She does not want hydroxyzine refilled or higher doses like she has had in the past so I wrote down dosing parameters for Benadryl as needed  We will refer her again to the allergy specialist she has been referred in the past but has not  gone.  No signs or concerns today of any anaphylactic reaction, airways intact, no stridor no wheeze no other systems symptoms of allergic reaction and patient continues to deny any specific foods drug soaps lotions detergents or triggers for this rash.  I did explain to her that there is a large portion of patient who end up having idiopathic chronic or intermittent urticaria and the symptoms are managed with similar medicines that we discussed above, usually steroids are not indicated, and allergist would be appropriate for further work-up and/or treatment    Delsa Grana, PA-C 04/12/20 11:17 AM

## 2020-04-20 ENCOUNTER — Ambulatory Visit: Payer: Medicare Other

## 2020-04-28 ENCOUNTER — Ambulatory Visit (INDEPENDENT_AMBULATORY_CARE_PROVIDER_SITE_OTHER): Payer: Medicare Other

## 2020-04-28 DIAGNOSIS — Z0000 Encounter for general adult medical examination without abnormal findings: Secondary | ICD-10-CM | POA: Diagnosis not present

## 2020-04-28 DIAGNOSIS — Z0100 Encounter for examination of eyes and vision without abnormal findings: Secondary | ICD-10-CM

## 2020-04-28 NOTE — Patient Instructions (Signed)
Elizabeth Mata , Thank you for taking time to come for your Medicare Wellness Visit. I appreciate your ongoing commitment to your health goals. Please review the following plan we discussed and let me know if I can assist you in the future.   Screening recommendations/referrals: Colonoscopy: done 06/23/19. Repeat in 2025 Mammogram: done 09/23/19 Bone Density: due age 59 Recommended yearly ophthalmology/optometry visit for glaucoma screening and checkup Recommended yearly dental visit for hygiene and checkup  Vaccinations: Influenza vaccine: done 08/26/19 Pneumococcal vaccine: due age 89 Tdap vaccine: done 10/27/17 Shingles vaccine: Shingrix discussed. Please contact your pharmacy for coverage information.  Covid-19: done 03/25/20 & 04/19/20  Advanced directives: Advance directive discussed with you today. Even though you declined this today please call our office should you change your mind and we can give you the proper paperwork for you to fill out.  Conditions/risks identified: Recommend healthy eating and physical activity   Next appointment: Please follow up in one year for your Medicare Annual Wellness visit.    Preventive Care 40-64 Years, Female Preventive care refers to lifestyle choices and visits with your health care provider that can promote health and wellness. What does preventive care include?  A yearly physical exam. This is also called an annual well check.  Dental exams once or twice a year.  Routine eye exams. Ask your health care provider how often you should have your eyes checked.  Personal lifestyle choices, including:  Daily care of your teeth and gums.  Regular physical activity.  Eating a healthy diet.  Avoiding tobacco and drug use.  Limiting alcohol use.  Practicing safe sex.  Taking low-dose aspirin daily starting at age 79.  Taking vitamin and mineral supplements as recommended by your health care provider. What happens during an annual well  check? The services and screenings done by your health care provider during your annual well check will depend on your age, overall health, lifestyle risk factors, and family history of disease. Counseling  Your health care provider may ask you questions about your:  Alcohol use.  Tobacco use.  Drug use.  Emotional well-being.  Home and relationship well-being.  Sexual activity.  Eating habits.  Work and work Statistician.  Method of birth control.  Menstrual cycle.  Pregnancy history. Screening  You may have the following tests or measurements:  Height, weight, and BMI.  Blood pressure.  Lipid and cholesterol levels. These may be checked every 5 years, or more frequently if you are over 76 years old.  Skin check.  Lung cancer screening. You may have this screening every year starting at age 53 if you have a 30-pack-year history of smoking and currently smoke or have quit within the past 15 years.  Fecal occult blood test (FOBT) of the stool. You may have this test every year starting at age 109.  Flexible sigmoidoscopy or colonoscopy. You may have a sigmoidoscopy every 5 years or a colonoscopy every 10 years starting at age 34.  Hepatitis C blood test.  Hepatitis B blood test.  Sexually transmitted disease (STD) testing.  Diabetes screening. This is done by checking your blood sugar (glucose) after you have not eaten for a while (fasting). You may have this done every 1-3 years.  Mammogram. This may be done every 1-2 years. Talk to your health care provider about when you should start having regular mammograms. This may depend on whether you have a family history of breast cancer.  BRCA-related cancer screening. This may be done if you  have a family history of breast, ovarian, tubal, or peritoneal cancers.  Pelvic exam and Pap test. This may be done every 3 years starting at age 23. Starting at age 64, this may be done every 5 years if you have a Pap test in  combination with an HPV test.  Bone density scan. This is done to screen for osteoporosis. You may have this scan if you are at high risk for osteoporosis. Discuss your test results, treatment options, and if necessary, the need for more tests with your health care provider. Vaccines  Your health care provider may recommend certain vaccines, such as:  Influenza vaccine. This is recommended every year.  Tetanus, diphtheria, and acellular pertussis (Tdap, Td) vaccine. You may need a Td booster every 10 years.  Zoster vaccine. You may need this after age 27.  Pneumococcal 13-valent conjugate (PCV13) vaccine. You may need this if you have certain conditions and were not previously vaccinated.  Pneumococcal polysaccharide (PPSV23) vaccine. You may need one or two doses if you smoke cigarettes or if you have certain conditions. Talk to your health care provider about which screenings and vaccines you need and how often you need them. This information is not intended to replace advice given to you by your health care provider. Make sure you discuss any questions you have with your health care provider. Document Released: 01/12/2016 Document Revised: 09/04/2016 Document Reviewed: 10/17/2015 Elsevier Interactive Patient Education  2017 [place] Prevention in the Home Falls can cause injuries. They can happen to people of all ages. There are many things you can do to make your home safe and to help prevent falls. What can I do on the outside of my home?  Regularly fix the edges of walkways and driveways and fix any cracks.  Remove anything that might make you trip as you walk through a door, such as a raised step or threshold.  Trim any bushes or trees on the path to your home.  Use bright outdoor lighting.  Clear any walking paths of anything that might make someone trip, such as rocks or tools.  Regularly check to see if handrails are loose or broken. Make sure that both  sides of any steps have handrails.  Any raised decks and porches should have guardrails on the edges.  Have any leaves, snow, or ice cleared regularly.  Use sand or salt on walking paths during winter.  Clean up any spills in your garage right away. This includes oil or grease spills. What can I do in the bathroom?  Use night lights.  Install grab bars by the toilet and in the tub and shower. Do not use towel bars as grab bars.  Use non-skid mats or decals in the tub or shower.  If you need to sit down in the shower, use a plastic, non-slip stool.  Keep the floor dry. Clean up any water that spills on the floor as soon as it happens.  Remove soap buildup in the tub or shower regularly.  Attach bath mats securely with double-sided non-slip rug tape.  Do not have throw rugs and other things on the floor that can make you trip. What can I do in the bedroom?  Use night lights.  Make sure that you have a light by your bed that is easy to reach.  Do not use any sheets or blankets that are too big for your bed. They should not hang down onto the floor.  Have  a firm chair that has side arms. You can use this for support while you get dressed.  Do not have throw rugs and other things on the floor that can make you trip. What can I do in the kitchen?  Clean up any spills right away.  Avoid walking on wet floors.  Keep items that you use a lot in easy-to-reach places.  If you need to reach something above you, use a strong step stool that has a grab bar.  Keep electrical cords out of the way.  Do not use floor polish or wax that makes floors slippery. If you must use wax, use non-skid floor wax.  Do not have throw rugs and other things on the floor that can make you trip. What can I do with my stairs?  Do not leave any items on the stairs.  Make sure that there are handrails on both sides of the stairs and use them. Fix handrails that are broken or loose. Make sure that  handrails are as long as the stairways.  Check any carpeting to make sure that it is firmly attached to the stairs. Fix any carpet that is loose or worn.  Avoid having throw rugs at the top or bottom of the stairs. If you do have throw rugs, attach them to the floor with carpet tape.  Make sure that you have a light switch at the top of the stairs and the bottom of the stairs. If you do not have them, ask someone to add them for you. What else can I do to help prevent falls?  Wear shoes that:  Do not have high heels.  Have rubber bottoms.  Are comfortable and fit you well.  Are closed at the toe. Do not wear sandals.  If you use a stepladder:  Make sure that it is fully opened. Do not climb a closed stepladder.   Make sure that both sides of the stepladder are locked into place.  Ask someone to hold it for you, if possible.  Clearly mark and make sure that you can see:  Any grab bars or handrails.  First and last steps.  Where the edge of each step is.  Use tools that help you move around (mobility aids) if they are needed. These include:  Canes.  Walkers.  Scooters.  Crutches.  Turn on the lights when you go into a dark area. Replace any light bulbs as soon as they burn out.  Set up your furniture so you have a clear path. Avoid moving your furniture around.  If any of your floors are uneven, fix them.  If there are any pets around you, be aware of where they are.  Review your medicines with your doctor. Some medicines can make you feel dizzy. This can increase your chance of falling. Ask your doctor what other things that you can do to help prevent falls. This information is not intended to replace advice given to you by your health care provider. Make sure you discuss any questions you have with your health care provider. Document Released: 10/12/2009 Document Revised: 05/23/2016 Document Reviewed: 01/20/2015 Elsevier Interactive Patient Education  2017  [employer].

## 2020-04-28 NOTE — Progress Notes (Signed)
Subjective:   Elizabeth Mata is a 59 y.o. female who presents for Medicare Annual (Subsequent) preventive examination.  Virtual Visit via Telephone Note  I connected with  Inetta Fermo on 04/28/20 at 11:20 AM EDT by telephone and verified that I am speaking with the correct person using two identifiers.  Medicare Annual Wellness visit completed telephonically due to Covid-19 pandemic.   Location: Patient: home Provider: office   I discussed the limitations, risks, security and privacy concerns of performing an evaluation and management service by telephone and the availability of in person appointments. The patient expressed understanding and agreed to proceed.  Unable to perform video visit due to patient does not have video capability.   Some vital signs may be absent or patient reported.   Clemetine Marker, LPN    Review of Systems:   Cardiac Risk Factors include: advanced age (>51men, >33 women);dyslipidemia     Objective:     Vitals: There were no vitals taken for this visit.  There is no height or weight on file to calculate BMI.  Advanced Directives 04/28/2020 06/23/2019 02/12/2019 10/27/2018 02/21/2018 01/23/2018 10/27/2017  Does Patient Have a Medical Advance Directive? No No No No No No No  Type of Advance Directive - - - - - - -  Does patient want to make changes to medical advance directive? - - - - - - -  Copy of [place] in Chart? - - - - - - -  Would patient like information on creating a medical advance directive? No - Patient declined - Yes (MAU/Ambulatory/Procedural Areas - Information given) - - No - Patient declined -    Tobacco Social History   Tobacco Use  Smoking Status Never Smoker  Smokeless Tobacco Never Used     Counseling given: Not Answered   Clinical Intake:  Pre-visit preparation completed: Yes  Pain : No/denies pain     Nutritional Status: BMI 25 -29 Overweight Nutritional Risks: None Diabetes: No  How  often do you need to have someone help you when you read instructions, pamphlets, or other written materials from your doctor or pharmacy?: 1 - Never  Interpreter Needed?: No  Information entered by :: Clemetine Marker LPN  Past Medical History:  Diagnosis Date  . Allergic rhinitis 06/30/2016  . Allergy   . Carotid atherosclerosis, bilateral 11/06/2017   [place] Nov 2018  . Cyst of breast, left, solitary   . Flank pain   . GERD (gastroesophageal reflux disease)   . Hemorrhoid   . Hyperlipidemia   . IFG (impaired fasting glucose) 07/18/2016   June 2017   . Kidney stones   . Neuropathy 07/24/2016  . Seizures ([place])   . Shingles outbreak 02/03/2017  . Syncope    Recurrent over the last 2 years. had workup at [facility] this year with cardiology and neurology. Had echo and 3-week event monitor that were unremarkable. Has had negative EEG. Echo in 11/11 at [facility] showed no significant abnormalities. Possibly vasovagal  . Trigger thumb    Past Surgical History:  Procedure Laterality Date  . ABDOMINAL HYSTERECTOMY  40s   endometriosis, fibroid tumors; no cancer, partial  . BREAST EXCISIONAL BIOPSY Left 1980'S   EXCISIONAL - NEG  . BREAST SURGERY    . CHOLECYSTECTOMY  31  . COLONOSCOPY WITH PROPOFOL N/A 06/23/2019   Procedure: COLONOSCOPY WITH PROPOFOL;  Surgeon: Virgel Manifold, MD;  Location: [facility] ENDOSCOPY;  Service: Endoscopy;  Laterality: N/A;  . ESOPHAGOGASTRODUODENOSCOPY (EGD) WITH  PROPOFOL N/A 06/23/2019   Procedure: ESOPHAGOGASTRODUODENOSCOPY (EGD) WITH PROPOFOL;  Surgeon: Virgel Manifold, MD;  Location: [facility] ENDOSCOPY;  Service: Endoscopy;  Laterality: N/A;  . FOOT SURGERY Right    tendon release   Family History  Problem Relation Age of Onset  . Coronary artery disease Maternal Uncle   . Diabetes Mellitus II Mother   . Arthritis Mother   . Diabetes Maternal Uncle   . Kidney cancer Father 55       died 19-May-2017  . Kidney disease Father   . Cancer Father        kidney and  bladder  . Arthritis Maternal Uncle   . Cancer Maternal Grandmother        stomach?  . Jaundice Maternal Grandmother   . Stomach cancer Paternal Grandmother   . Cancer Paternal Grandmother        stomach  . Hypertension Sister   . Stroke Maternal Grandfather   . Diabetes Paternal Grandfather   . Diabetes Sister   . Hypertension Sister   . Breast cancer Neg Hx   . Bladder Cancer Neg Hx    Social History   Socioeconomic History  . Marital status: Legally Separated    Spouse name: Not on file  . Number of children: 2  . Years of education: Not on file  . Highest education level: GED or equivalent  Occupational History  . Occupation: unemployed  Tobacco Use  . Smoking status: Never Smoker  . Smokeless tobacco: Never Used  Substance and Sexual Activity  . Alcohol use: Yes    Alcohol/week: 0.0 standard drinks    Comment: occ  . Drug use: Never  . Sexual activity: Yes    Partners: Male  Other Topics Concern  . Not on file  Social History Narrative   Regular exercise   Social Determinants of Health   Financial Resource Strain: Low Risk   . Difficulty of Paying Living Expenses: Not very hard  Food Insecurity: No Food Insecurity  . Worried About Charity fundraiser in the Last Year: Never true  . Ran Out of Food in the Last Year: Never true  Transportation Needs: No Transportation Needs  . Lack of Transportation (Medical): No  . Lack of Transportation (Non-Medical): No  Physical Activity: Insufficiently Active  . Days of Exercise per Week: 2 days  . Minutes of Exercise per Session: 30 min  Stress: No Stress Concern Present  . Feeling of Stress : Not at all  Social Connections: Somewhat Isolated  . Frequency of Communication with Friends and Family: More than three times a week  . Frequency of Social Gatherings with Friends and Family: Three times a week  . Attends Religious Services: More than 4 times per year  . Active Member of Clubs or Organizations: No  .  Attends Archivist Meetings: Never  . Marital Status: Separated    Outpatient Encounter Medications as of 04/28/2020  Medication Sig  . aspirin EC 81 MG EC tablet Take 1 tablet (81 mg total) by mouth daily.  Marland Kitchen atorvastatin (LIPITOR) 80 MG tablet Take 1 tablet (80 mg total) by mouth daily.  . fexofenadine (ALLEGRA) 180 MG tablet Take 180 mg by mouth daily. PRN  . fluticasone (FLONASE) 50 MCG/ACT nasal spray SPRAY 2 SPRAYS INTO EACH NOSTRIL EVERY DAY  . montelukast (SINGULAIR) 10 MG tablet Take 1 tablet (10 mg total) by mouth at bedtime.  . naproxen (NAPROSYN) 500 MG tablet Take 1 tablet (500  mg total) by mouth 2 (two) times daily with a meal.  . omeprazole (PRILOSEC) 20 MG capsule Take 1 capsule (20 mg total) by mouth 2 (two) times daily before a meal.  . topiramate (TOPAMAX) 200 MG tablet Take 200 mg by mouth 2 (two) times daily.   . [DISCONTINUED] predniSONE (DELTASONE) 10 MG tablet Take 60 mg PO QAM x 2d, 50 mg poqam x 2d ... 10 mg poqam x 2d   No facility-administered encounter medications on file as of 04/28/2020.    Activities of Daily Living In your present state of health, do you have any difficulty performing the following activities: 04/28/2020 04/12/2020  Hearing? N N  Comment declines hearing aids -  Vision? Y Y  Difficulty concentrating or making decisions? N N  Walking or climbing stairs? N N  Dressing or bathing? N N  Doing errands, shopping? N N  Preparing Food and eating ? N -  Using the Toilet? N -  In the past six months, have you accidently leaked urine? N -  Do you have problems with loss of bowel control? N -  Managing your Medications? N -  Managing your Finances? N -  Housekeeping or managing your Housekeeping? N -  Some recent data might be hidden    Patient Care Team: Delsa Grana, PA-C as PCP - General (Family Medicine) Mhoon, Larkin Ina, MD as Consulting Physician (Neurology) Christene Lye, MD (General Surgery) Teodoro Spray, MD as  Consulting Physician (Cardiology)    Assessment:   This is a routine wellness examination for [place].  Exercise Activities and Dietary recommendations Current Exercise Habits: Home exercise routine, Type of exercise: walking, Time (Minutes): 30, Frequency (Times/Week): 2, Weekly Exercise (Minutes/Week): 60, Intensity: Mild, Exercise limited by: None identified  Goals    . Patient Stated     Patient states she would like to be more productive over the next year.        Fall Risk Fall Risk  04/28/2020 04/12/2020 02/21/2020 08/03/2019 06/01/2019  Falls in the past year? 0 0 0 0 0  Number falls in past yr: 0 0 0 0 -  Injury with Fall? 0 0 0 0 -  Risk for fall due to : No Fall Risks - - - -  Follow up Falls prevention discussed - - Falls evaluation completed -   FALL RISK PREVENTION PERTAINING TO THE HOME:  Any stairs in or around the home? Yes  If so, do they handrails? Yes   Home free of loose throw rugs in walkways, pet beds, electrical cords, etc? Yes  Adequate lighting in your home to reduce risk of falls? Yes   ASSISTIVE DEVICES UTILIZED TO PREVENT FALLS:  Life alert? No  Use of a cane, walker or w/c? No  Grab bars in the bathroom? Yes  Shower chair or bench in shower? No  Elevated toilet seat or a handicapped toilet? No   DME ORDERS:  DME order needed?  No   TIMED UP AND GO:  Was the test performed? No . Telephonic visit  Education: Fall risk prevention has been discussed.  Intervention(s) required? No    Depression Screen PHQ 2/9 Scores 04/28/2020 04/12/2020 02/21/2020 08/03/2019  PHQ - 2 Score 0 0 0 0  PHQ- 9 Score - 0 0 0     Cognitive Function - Pt declined 6CIT for 2021 AWV.      6CIT Screen 02/12/2019  What Year? 0 points  What month? 0 points  What time? 0  points  Count back from 20 0 points  Months in reverse 0 points  Repeat phrase 0 points  Total Score 0    Immunization History  Administered Date(s) Administered  . Influenza,inj,Quad PF,6+ Mos  09/13/2015, 10/08/2016, 09/26/2017, 09/15/2018, 08/26/2019  . Influenza-Unspecified 09/13/2015, 10/08/2016, 09/26/2017, 09/15/2018  . PFIZER SARS-COV-2 Vaccination 03/25/2020, 04/19/2020  . Tdap 10/27/2017    Qualifies for Shingles Vaccine? Yes  . Due for Shingrix. Education has been provided regarding the importance of this vaccine. Pt has been advised to call insurance company to determine out of pocket expense. Advised may also receive vaccine at local pharmacy or Health Dept. Verbalized acceptance and understanding.  Tdap: Up to date  Flu Vaccine: Up to date  Pneumococcal Vaccine: Due for Pneumococcal vaccine at age 70.  Covid-19 Vaccine: Up to date    Screening Tests Health Maintenance  Topic Date Due  . INFLUENZA VACCINE  07/30/2020  . MAMMOGRAM  09/22/2021  . COLONOSCOPY  06/22/2024  . TETANUS/TDAP  10/28/2027  . COVID-19 Vaccine  Completed  . Hepatitis C Screening  Completed  . HIV Screening  Completed  . PAP SMEAR-Modifier  Discontinued    Cancer Screenings:  Colorectal Screening: Completed 06/23/19. Repeat every 5 years;   Mammogram: Completed 09/23/19. Repeat every year  Bone Density: due age 28   Lung Cancer Screening: (Low Dose CT Chest recommended if Age 43-80 years, 30 pack-year currently smoking OR have quit w/in 15years.) does not qualify.   Additional Screening:  Hepatitis C Screening: does qualify; Completed 07/26/19  Vision Screening: Recommended annual ophthalmology exams for early detection of glaucoma and other disorders of the eye. Is the patient up to date with their annual eye exam?  No  Who is the provider or what is the name of the office in which the pt attends annual eye exams? Not established If pt is not established with a provider, would they like to be referred to a provider to establish care? Yes . Ophthalmology referral has been placed. Pt aware the office will call re: appt.  Dental Screening: Recommended annual dental exams for  proper oral hygiene  Community Resource Referral:  CRR required this visit?  No      Plan:     I have personally reviewed and addressed the Medicare Annual Wellness questionnaire and have noted the following in the patient's chart:  A. Medical and social history B. Use of alcohol, tobacco or illicit drugs  C. Current medications and supplements D. Functional ability and status E.  Nutritional status F.  Physical activity G. Advance directives H. List of other physicians I.  Hospitalizations, surgeries, and ER visits in previous 12 months J.  [place] such as hearing and vision if needed, cognitive and depression L. Referrals and appointments   In addition, I have reviewed and discussed with patient certain preventive protocols, quality metrics, and best practice recommendations. A written personalized care plan for preventive services as well as general preventive health recommendations were provided to patient.   Signed,  Clemetine Marker, LPN Nurse Health Advisor   Nurse Notes: pt c/o hives that come and go. Confirmed referral sent to [place] [facility]. Provided phone # to patient to call for follow up.

## 2020-05-14 ENCOUNTER — Other Ambulatory Visit: Payer: Self-pay | Admitting: Family Medicine

## 2020-05-14 DIAGNOSIS — L509 Urticaria, unspecified: Secondary | ICD-10-CM

## 2020-06-19 ENCOUNTER — Ambulatory Visit (INDEPENDENT_AMBULATORY_CARE_PROVIDER_SITE_OTHER): Payer: Medicare Other | Admitting: Dermatology

## 2020-06-19 ENCOUNTER — Other Ambulatory Visit: Payer: Self-pay

## 2020-06-19 DIAGNOSIS — L219 Seborrheic dermatitis, unspecified: Secondary | ICD-10-CM | POA: Diagnosis not present

## 2020-06-19 DIAGNOSIS — L811 Chloasma: Secondary | ICD-10-CM

## 2020-06-19 DIAGNOSIS — L439 Lichen planus, unspecified: Secondary | ICD-10-CM

## 2020-06-19 DIAGNOSIS — L509 Urticaria, unspecified: Secondary | ICD-10-CM

## 2020-06-19 DIAGNOSIS — L821 Other seborrheic keratosis: Secondary | ICD-10-CM

## 2020-06-19 MED ORDER — EUCRISA 2 % EX OINT: TOPICAL_OINTMENT | CUTANEOUS | 3 refills | Status: DC

## 2020-06-19 MED ORDER — HYDROCORTISONE 2.5 % EX CREA: TOPICAL_CREAM | CUTANEOUS | 3 refills | Status: DC

## 2020-06-19 MED ORDER — KETOCONAZOLE 2 % EX SHAM: MEDICATED_SHAMPOO | CUTANEOUS | 3 refills | Status: DC

## 2020-06-19 MED ORDER — KETOCONAZOLE 2 % EX CREA: TOPICAL_CREAM | CUTANEOUS | 3 refills | Status: DC

## 2020-06-19 NOTE — Progress Notes (Signed)
   Follow-Up Visit   Subjective  Elizabeth Mata is a 59 y.o. female, seen by Dr Laurence Ferrari in past,  who presents for the following: lesions (itchy lesions on the forehead that are very irritating ), discoloration (patient has noticed discoloration on her face that she is concerned about and would like to discuss treatment options), lichen planus (on the ankles and wrist - patient did try the TMC 0.1% ointment but didn't find it helpful), and seborrheic dermatitis (of the face and scalp - patient tried the Ketoconazole 2% shampoo and cream, as well as Hydrocortisone cream but didn't find it helpful. Patient states that her insurance would not pay for the Fluocinolone oil).  The following portions of the chart were reviewed this encounter and updated as appropriate:  Tobacco  Allergies  Meds  Problems  Med Hx  Surg Hx  Fam Hx      Review of Systems:  No other skin or systemic complaints except as noted in HPI or Assessment and Plan.  Objective  Well appearing patient in no apparent distress; mood and affect are within normal limits.  A focused examination was performed including the face and scalp. Relevant physical exam findings are noted in the Assessment and Plan.  Objective  Face: Reticulated hyperpigmented patches.   Objective  Face and scalp: Pink patches with greasy scale.   Objective  Trunk, extremities: Clear   Assessment & Plan    Melasma Face  Continue sunscreen daily (must be persistent). Start Skin [employer] skin lightening mix QHS: Hydroquinone: 8% Tretinoin: 0.1% Kojic Acid: 1% Niacinamide: 4% Fluocinolone: 0.025% Vehicle: Cream.   Seborrheic dermatitis Face and scalp  Continue Ketoconazole 2% cream QOD alternating with HC 2.5% cream QOD, and Ketoconazole 2% shampoo QW let sit 5-10 minutes before washing out.  ketoconazole (NIZORAL) 2 % cream - Face and scalp  hydrocortisone 2.5 % cream - Face and scalp  ketoconazole (NIZORAL) 2 % shampoo - Face  and scalp  Lichen planus vs Lichenified Atopic Dermatitis Left Foot - Anterior  Vs lichenified atopic dermatitis  Start Eucrisa 2% ointment to aa's QD-BID PRN. Samples given.   Crisaborole (EUCRISA) 2 % OINT - Left Foot - Anterior  Urticaria Trunk, extremities  History of - clear today  D/C Hydroxyzine due to extreme drowsiness. Continue Allegra 180mg  po QD but increase to BID during periods of flare.   Seborrheic Keratoses - face - Stuck-on, waxy, tan-brown papules and plaques  - Discussed benign etiology and prognosis. - Observe - Call for any changes - Discussed ED if bothersome   Return in about 3 months (around 09/19/2020).  Luther Redo, CMA, am acting as scribe for Sarina Ser, MD .  Documentation: I have reviewed the above documentation for accuracy and completeness, and I agree with the above.  Sarina Ser, MD

## 2020-06-21 ENCOUNTER — Encounter: Payer: Self-pay | Admitting: Dermatology

## 2020-08-05 ENCOUNTER — Emergency Department
Admission: EM | Admit: 2020-08-05 | Discharge: 2020-08-05 | Disposition: A | Discharge status: Home / Self Care | Payer: Medicare Other | Attending: Emergency Medicine | Admitting: Emergency Medicine

## 2020-08-05 ENCOUNTER — Emergency Department: Payer: Medicare Other

## 2020-08-05 DIAGNOSIS — R509 Fever, unspecified: Secondary | ICD-10-CM | POA: Insufficient documentation

## 2020-08-05 DIAGNOSIS — R42 Dizziness and giddiness: Secondary | ICD-10-CM | POA: Diagnosis not present

## 2020-08-05 DIAGNOSIS — R110 Nausea: Secondary | ICD-10-CM | POA: Diagnosis not present

## 2020-08-05 DIAGNOSIS — Z7982 Long term (current) use of aspirin: Secondary | ICD-10-CM | POA: Diagnosis not present

## 2020-08-05 DIAGNOSIS — Z79899 Other long term (current) drug therapy: Secondary | ICD-10-CM | POA: Insufficient documentation

## 2020-08-05 DIAGNOSIS — R519 Headache, unspecified: Secondary | ICD-10-CM | POA: Insufficient documentation

## 2020-08-05 LAB — CBC
HCT: 37.3 % (ref 36.0–46.0)
Hemoglobin: 13.3 g/dL (ref 12.0–15.0)
MCH: 32.8 pg (ref 26.0–34.0)
MCHC: 35.7 g/dL (ref 30.0–36.0)
MCV: 91.9 fL (ref 80.0–100.0)
Platelets: 263 10*3/uL (ref 150–400)
RBC: 4.06 MIL/uL (ref 3.87–5.11)
RDW: 12.9 % (ref 11.5–15.5)
WBC: 6.5 10*3/uL (ref 4.0–10.5)
nRBC: 0 % (ref 0.0–0.2)

## 2020-08-05 LAB — COMPREHENSIVE METABOLIC PANEL
ALT: 33 U/L (ref 0–44)
AST: 28 U/L (ref 15–41)
Albumin: 4.2 g/dL (ref 3.5–5.0)
Alkaline Phosphatase: 66 U/L (ref 38–126)
Anion gap: 8 (ref 5–15)
BUN: 15 mg/dL (ref 6–20)
CO2: 22 mmol/L (ref 22–32)
Calcium: 9.2 mg/dL (ref 8.9–10.3)
Chloride: 114 mmol/L — ABNORMAL HIGH (ref 98–111)
Creatinine, Ser: 0.79 mg/dL (ref 0.44–1.00)
GFR calc Af Amer: >60 mL/min (ref >60)
GFR calc non Af Amer: >60 mL/min (ref >60)
Glucose, Bld: 99 mg/dL (ref 70–99)
Potassium: 3.3 mmol/L — ABNORMAL LOW (ref 3.5–5.1)
Sodium: 144 mmol/L (ref 135–145)
Total Bilirubin: 0.8 mg/dL (ref 0.3–1.2)
Total Protein: 6.9 g/dL (ref 6.5–8.1)

## 2020-08-05 LAB — ETHANOL: Alcohol, Ethyl (B): <10 mg/dL (ref <10)

## 2020-08-05 MED ORDER — MECLIZINE HCL 25 MG PO TABS: 25.0000 mg | ORAL_TABLET | Freq: Two times a day (BID) | ORAL | 0 refills | Status: DC | PRN

## 2020-08-05 MED ORDER — MECLIZINE HCL 25 MG PO TABS
25.0000 mg | ORAL_TABLET | Freq: Once | ORAL | Status: AC
  Administered 2020-08-05: 25 mg via ORAL
  Filled 2020-08-05: qty 1

## 2020-08-05 NOTE — ED Triage Notes (Addendum)
Onset of dizziness with gradual progression over the last two days. Negative stroke screen with EMS. Patient able to ambulate with assistance. Patient states the dizziness became worse last night to the point of almost falling. Has had some nausea without vomiting.

## 2020-08-05 NOTE — Discharge Instructions (Signed)
Please call and schedule follow-up appointment with your primary care provider.  Change positions slowly.  You may take the medication prescribed today two times per day if needed for dizziness.

## 2020-08-05 NOTE — ED Provider Notes (Signed)
[facility] Emergency Department Provider Note  ____________________________________________   First MD Initiated Contact with Patient 08/05/20 1340     (approximate)  I have reviewed the triage vital signs and the nursing notes.   HISTORY  Chief Complaint Dizziness   HPI Elizabeth Mata is a 59 y.o. female with history of TIA and other medical illness as listed below presents to the emergency department for evaluation after experiencing acute worsening of dizziness last night at about 10:00pm. She states that she has been off balance for a week or so, but last night she had to hold onto the wall and door knobs to make it to her bedroom and bathroom. She has also developed nausea and headache and felt that her right foot wasn't moving normally. No other weakness. No previous diagnosis of vertigo. No recent medication changes.      Past Medical History:  Diagnosis Date  . Allergic rhinitis 06/30/2016  . Allergy   . Carotid atherosclerosis, bilateral 11/06/2017   [place] Nov 2018  . Cyst of breast, left, solitary   . Flank pain   . GERD (gastroesophageal reflux disease)   . Hemorrhoid   . Hyperlipidemia   . IFG (impaired fasting glucose) 07/18/2016   June 2017   . Kidney stones   . Neuropathy 07/24/2016  . Seizures ([place])   . Shingles outbreak 02/03/2017  . Syncope    Recurrent over the last 2 years. had workup at [facility] this year with cardiology and neurology. Had echo and 3-week event monitor that were unremarkable. Has had negative EEG. Echo in 11/11 at [facility] showed no significant abnormalities. Possibly vasovagal  . Trigger thumb     Patient Active Problem List   Diagnosis Date Noted  . Pelvic floor weakness in female 03/03/2020  . LLQ pain 03/03/2020  . History of colonic polyps   . Polyp of ascending colon   . Hemorrhoids   . Stomach irritation   . Abdominal pain, epigastric   . Dizziness 10/27/2018  . Palpitations 10/27/2018  . Urinary urgency  04/21/2018  . Menopausal symptom 04/21/2018  . Urticaria 01/05/2018  . Carotid atherosclerosis, bilateral 11/06/2017  . TIA (transient ischemic attack) 10/28/2017  . Hx of completed stroke 10/28/2017  . Ataxia 10/08/2017  . Breast pain in female 07/11/2017  . Medication monitoring encounter 12/20/2016  . Abnormality of globulin 07/26/2016  . Neuropathy 07/24/2016  . 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 IFG (impaired fasting glucose) 07/18/2016  . Hematuria 07/03/2016  . Allergic rhinitis 06/30/2016  . Chronic left hip pain 06/28/2016  . Breast cancer screening 04/17/2016  . Skin texture changes 12/27/2015  . Vaginal discharge 10/31/2015  . Screening for STD (sexually transmitted disease) 10/31/2015  . Kidney stone on left side 10/24/2015  . Annual physical exam 09/13/2015  . Encounter for screening mammogram for malignant neoplasm of breast 09/13/2015  . Need for immunization against influenza 09/13/2015  . Eczema 09/13/2015  . Irritable bowel syndrome with constipation 09/13/2015  . Seizures ([place]) 08/16/2015  . Carpal tunnel syndrome 07/26/2014  . Carotid artery narrowing 07/05/2013  . 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 ABDOMINAL HYSTERECTOMY  40s   endometriosis, fibroid tumors; no cancer, partial  . BREAST EXCISIONAL BIOPSY Left 1980'S   EXCISIONAL - NEG  . BREAST SURGERY    . CHOLECYSTECTOMY  31  . COLONOSCOPY WITH PROPOFOL N/A 06/23/2019   Procedure: COLONOSCOPY WITH PROPOFOL;  Surgeon: Vonda Antigua  B, MD;  Location: [facility] ENDOSCOPY;  Service: Endoscopy;  Laterality: N/A;  . ESOPHAGOGASTRODUODENOSCOPY (EGD) WITH PROPOFOL N/A 06/23/2019   Procedure: ESOPHAGOGASTRODUODENOSCOPY (EGD) WITH PROPOFOL;  Surgeon: Virgel Manifold, MD;  Location: [facility] ENDOSCOPY;  Service: Endoscopy;  Laterality: N/A;  . FOOT SURGERY Right    tendon release    Prior to Admission medications   Medication Sig Start Date End  Date Taking? Authorizing Provider  aspirin EC 81 MG EC tablet Take 1 tablet (81 mg total) by mouth daily. 10/10/17   Henreitta Leber, MD  atorvastatin (LIPITOR) 80 MG tablet Take 1 tablet (80 mg total) by mouth daily. 08/03/19   Hubbard Hartshorn, FNP  Crisaborole (EUCRISA) 2 % OINT Apply to the wrist and feet QD-BID PRN 06/19/20   Ralene Bathe, MD  famotidine (PEPCID) 20 MG tablet TAKE 1 TABLET (20 MG TOTAL) BY MOUTH 2 (TWO) TIMES DAILY AS NEEDED. 05/16/20   Delsa Grana, PA-C  fexofenadine (ALLEGRA) 180 MG tablet Take 180 mg by mouth daily. PRN    [provider]  fluticasone (FLONASE) 50 MCG/ACT nasal spray SPRAY 2 SPRAYS INTO EACH NOSTRIL EVERY DAY 08/27/19   Hubbard Hartshorn, FNP  hydrocortisone 2.5 % cream Apply to aa's face QOD alternating with Ketoconazole cream 06/19/20   Ralene Bathe, MD  ketoconazole (NIZORAL) 2 % cream Apply to aa's face QOD alternating with Hydrocortisone cream 06/19/20   Ralene Bathe, MD  ketoconazole (NIZORAL) 2 % shampoo Shampoo into scalp let sit 10 minutes then wash out. Use QW. 06/19/20   Ralene Bathe, MD  meclizine (ANTIVERT) 25 MG tablet Take 1 tablet (25 mg total) by mouth 2 (two) times daily as needed for dizziness. 08/05/20   Laiklyn Pilkenton, Johnette Abraham B, FNP  montelukast (SINGULAIR) 10 MG tablet TAKE 1 TABLET BY MOUTH EVERYDAY AT BEDTIME 05/16/20   Delsa Grana, PA-C  naproxen (NAPROSYN) 500 MG tablet Take 1 tablet (500 mg total) by mouth 2 (two) times daily with a meal. 08/09/19   Hubbard Hartshorn, FNP  omeprazole (PRILOSEC) 20 MG capsule Take 1 capsule (20 mg total) by mouth 2 (two) times daily before a meal. 09/13/19   Vonda Antigua B, MD  topiramate (TOPAMAX) 200 MG tablet Take 200 mg by mouth 2 (two) times daily.  01/23/17   [provider]    Allergies Clindamycin/lincomycin, Hydrocodone, Metronidazole, Sulfa antibiotics, and Oxycodone  Family History  Problem Relation Age of Onset  . Coronary artery disease Maternal Uncle   . Diabetes  Mellitus II Mother   . Arthritis Mother   . Diabetes Maternal Uncle   . Kidney cancer Father 72       died 04-20-17  . Kidney disease Father   . Cancer Father        kidney and bladder  . Arthritis Maternal Uncle   . Cancer Maternal Grandmother        stomach?  . Jaundice Maternal Grandmother   . Stomach cancer Paternal Grandmother   . Cancer Paternal Grandmother        stomach  . Hypertension Sister   . Stroke Maternal Grandfather   . Diabetes Paternal Grandfather   . Diabetes Sister   . Hypertension Sister   . Breast cancer Neg Hx   . Bladder Cancer Neg Hx     Social History Social History   Tobacco Use  . Smoking status: Never Smoker  . Smokeless tobacco: Never Used  Vaping Use  . Vaping Use: Never  used  Substance Use Topics  . Alcohol use: Yes    Alcohol/week: 0.0 standard drinks    Comment: occ  . Drug use: Never    Review of Systems  Constitutional: No fever/chills Eyes: No visual changes. ENT: No sore throat. Cardiovascular: Denies chest pain. Respiratory: Denies shortness of breath. Gastrointestinal: No abdominal pain.  Positive for nausea, no vomiting.  No diarrhea.  No constipation. Genitourinary: Negative for dysuria. Musculoskeletal: Negative for back pain. Skin: Negative for rash. Neurological: Positive for headaches, focal weakness or numbness. ____________________________________________   PHYSICAL EXAM:  VITAL SIGNS: ED Triage Vitals  Enc Vitals Group     BP 08/05/20 0515 124/82     Pulse Rate 08/05/20 0515 74     Resp 08/05/20 0515 18     Temp 08/05/20 0515 97.9 F (36.6 C)     Temp Source 08/05/20 0515 Oral     SpO2 08/05/20 0515 100 %     Weight 08/05/20 0524 135 lb (61.2 kg)     Height 08/05/20 0524 4\' 11"  (1.499 m)     Head Circumference --      Peak Flow --      Pain Score 08/05/20 0524 0     Pain Loc --      Pain Edu? --      Excl. in [place]? --     Constitutional: Alert and oriented. Well appearing and in no acute  distress. Eyes: Conjunctivae are normal. PERRL. EOMI. Head: Atraumatic. Nose: No congestion/rhinnorhea. Mouth/Throat: Mucous membranes are moist.  Oropharynx non-erythematous. Neck: No stridor.   Cardiovascular: Normal rate, regular rhythm. Grossly normal heart sounds.  Good peripheral circulation. Respiratory: Normal respiratory effort.  No retractions. Lungs CTAB. Gastrointestinal: Soft and nontender. No distention. No abdominal bruits. No CVA tenderness. Musculoskeletal: No lower extremity tenderness nor edema.  No joint effusions. Neurologic:  Normal speech and language. No gross focal neurologic deficits are appreciated. No gait instability. Negative Romberg. CN II-IIX intact as tested. Skin:  Skin is warm, dry and intact. No rash noted. Psychiatric: Mood and affect are normal. Speech and behavior are normal.  ____________________________________________   LABS (all labs ordered are listed, but only abnormal results are displayed)  Labs Reviewed  COMPREHENSIVE METABOLIC PANEL - Abnormal; Notable for the following components:      Result Value   Potassium 3.3 (*)    Chloride 114 (*)    All other components within normal limits  CBC  ETHANOL   ____________________________________________  EKG  ED ECG REPORT I, Sheronica Corey, FNP-BC personally viewed and interpreted this ECG.   Date: 08/05/2020  EKG Time: 0515  Rate: 74  Rhythm: normal EKG, normal sinus rhythm  Axis: normal  Intervals:none  ST&T Change: no ST elevation  ____________________________________________  RADIOLOGY  ED MD interpretation:    CT head without acute findings.  MR Brain without acute findings.  Official radiology report(s): CT Head Wo Contrast  Result Date: 08/05/2020 CLINICAL DATA:  Dizziness EXAM: CT HEAD WITHOUT CONTRAST TECHNIQUE: Contiguous axial images were obtained from the base of the skull through the vertex without intravenous contrast. COMPARISON:  None. FINDINGS: Brain: There  is no mass, hemorrhage or extra-axial collection. The size and configuration of the ventricles and extra-axial CSF spaces are normal. The brain parenchyma is normal, without acute or chronic infarction. Vascular: No abnormal hyperdensity of the major intracranial arteries or dural venous sinuses. No intracranial atherosclerosis. Skull: The visualized skull base, calvarium and extracranial soft tissues are normal. Sinuses/Orbits: No fluid levels  or advanced mucosal thickening of the visualized paranasal sinuses. No mastoid or middle ear effusion. The orbits are normal. IMPRESSION: Normal head CT. Electronically Signed   By: Ulyses Jarred M.D.   On: 08/05/2020 06:26   MR BRAIN WO CONTRAST  Result Date: 08/05/2020 CLINICAL DATA:  TIA.  Dizziness EXAM: MRI HEAD WITHOUT CONTRAST TECHNIQUE: Multiplanar, multiecho pulse sequences of the brain and surrounding structures were obtained without intravenous contrast. COMPARISON:  CT head 08/05/2020 FINDINGS: Brain: No acute infarction, hemorrhage, hydrocephalus, extra-axial collection or mass lesion. Vascular: Normal arterial flow voids Skull and upper cervical spine: No focal skeletal lesion. Sinuses/Orbits: Paranasal sinuses clear. Mastoid clear bilaterally. Negative orbit Other: None IMPRESSION: Normal MRI head Electronically Signed   By: Franchot Gallo M.D.   On: 08/05/2020 15:14    ____________________________________________   PROCEDURES  Procedure(s) performed (including Critical Care):  Procedures   ____________________________________________   INITIAL IMPRESSION / ASSESSMENT AND PLAN / ED COURSE    59 year old female presenting to the emergency department for treatment and evaluation of dizziness with other symptoms as described in the HPI.  Plan will be to review labs drawn while awaiting ER room assignment as well as results of head CT.  Differential diagnosis includes but is not limited to: Vertigo, TIA, CVA  Chart review shows that she has  had multiple episodes of near syncopal episodes as well as complaint of dizziness without any concrete diagnosis.  Previous providers have felt that this may potentially be vasovagal episodes.  Patient observed ambulating.  Gait is steady and coordinated.  Patient states that she has a longstanding family history of CVA.  She states that she herself has had 2 TIAs in the past.  Her symptoms last night were similar to the second TIA.  Exam is more consistent with vertigo but due to family and personal past medical history, will proceed with MRI of the brain.  At this time, the patient has no focal deficits.  Will give her a tablet of meclizine to see if this helps.      Clinical Course as of Aug 05 1605  Sat Aug 05, 2020  1517 MR BRAIN WO CONTRAST [CT]  1518 MR Brain normal.    [CT]    Clinical Course User Index [CT] Maral Lampe B, FNP   Dizziness and nausea resolved after meclizine. Orthostatic vital signs are reassuring. She will be discharged home with meclizine Rx and advised to follow up with her PCP. She is to return to the ER for symptoms of concern if unable to see PCP.  ____________________________________________   FINAL CLINICAL IMPRESSION(S) / ED DIAGNOSES  Final diagnoses:  Dizziness     ED Discharge Orders         Ordered    meclizine (ANTIVERT) 25 MG tablet  2 times daily PRN     Discontinue  Reprint     08/05/20 1603           Note:  This document was prepared using Dragon voice recognition software and may include unintentional dictation errors.    Victorino Dike, FNP 08/05/20 1606    Harvest Dark, MD 08/06/20 1136

## 2020-08-07 ENCOUNTER — Ambulatory Visit: Payer: Medicare Other | Admitting: Gastroenterology

## 2020-08-07 ENCOUNTER — Encounter: Payer: Self-pay | Admitting: *Deleted

## 2020-08-21 ENCOUNTER — Other Ambulatory Visit: Payer: Self-pay

## 2020-08-23 ENCOUNTER — Other Ambulatory Visit: Payer: Self-pay

## 2020-08-23 DIAGNOSIS — I6523 Occlusion and stenosis of bilateral carotid arteries: Secondary | ICD-10-CM

## 2020-08-23 DIAGNOSIS — R350 Frequency of micturition: Secondary | ICD-10-CM

## 2020-08-23 DIAGNOSIS — R319 Hematuria, unspecified: Secondary | ICD-10-CM

## 2020-08-23 MED ORDER — ATORVASTATIN CALCIUM 80 MG PO TABS: 80.0000 mg | ORAL_TABLET | Freq: Every day | ORAL | 3 refills | Status: DC

## 2020-08-24 ENCOUNTER — Other Ambulatory Visit: Payer: Self-pay | Admitting: Family Medicine

## 2020-08-24 DIAGNOSIS — Z1231 Encounter for screening mammogram for malignant neoplasm of breast: Secondary | ICD-10-CM

## 2020-09-15 ENCOUNTER — Ambulatory Visit: Payer: Medicare Other | Admitting: Family Medicine

## 2020-09-21 ENCOUNTER — Ambulatory Visit: Payer: Medicare Other | Admitting: Dermatology

## 2020-09-25 ENCOUNTER — Ambulatory Visit
Admission: RE | Admit: 2020-09-25 | Discharge: 2020-09-25 | Disposition: A | Discharge status: Home / Self Care | Payer: Medicare Other | Source: Ambulatory Visit | Attending: Family Medicine | Admitting: Family Medicine

## 2020-09-25 ENCOUNTER — Other Ambulatory Visit: Payer: Self-pay

## 2020-09-25 DIAGNOSIS — Z1231 Encounter for screening mammogram for malignant neoplasm of breast: Secondary | ICD-10-CM | POA: Diagnosis not present

## 2020-10-10 NOTE — Progress Notes (Deleted)
Patient ID: Elizabeth Mata, female    DOB: 1961/01/12, 59 y.o.   MRN: [id number]  PCP: Delsa Grana, PA-C  No chief complaint on file.   Subjective:   Elizabeth Mata is a 59 y.o. female, presents to clinic with CC of the following:  HPI    Patient Active Problem List   Diagnosis Date Noted  . Pelvic floor weakness in female 03/03/2020  . LLQ pain 03/03/2020  . History of colonic polyps   . Polyp of ascending colon   . Hemorrhoids   . Stomach irritation   . Abdominal pain, epigastric   . Dizziness 10/27/2018  . Palpitations 10/27/2018  . Urinary urgency 04/21/2018  . Menopausal symptom 04/21/2018  . Urticaria 01/05/2018  . Carotid atherosclerosis, bilateral 11/06/2017  . TIA (transient ischemic attack) 10/28/2017  . Hx of completed stroke 10/28/2017  . Ataxia 10/08/2017  . Breast pain in female 07/11/2017  . Medication monitoring encounter 12/20/2016  . Abnormality of globulin 07/26/2016  . Neuropathy 07/24/2016  . 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 IFG (impaired fasting glucose) 07/18/2016  . Hematuria 07/03/2016  . Allergic rhinitis 06/30/2016  . Chronic left hip pain 06/28/2016  . Breast cancer screening 04/17/2016  . Skin texture changes 12/27/2015  . Vaginal discharge 10/31/2015  . Screening for STD (sexually transmitted disease) 10/31/2015  . Kidney stone on left side 10/24/2015  . Annual physical exam 09/13/2015  . Encounter for screening mammogram for malignant neoplasm of breast 09/13/2015  . Need for immunization against influenza 09/13/2015  . Eczema 09/13/2015  . Irritable bowel syndrome with constipation 09/13/2015  . Seizures ([place]) 08/16/2015  . Carpal tunnel syndrome 07/26/2014  . Carotid artery narrowing 07/05/2013  . 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  aspirin EC 81 MG EC tablet, Take 1 tablet (81 mg total) by mouth daily., Disp: 30 tablet, Rfl: 1 .  atorvastatin (LIPITOR) 80 MG tablet,  Take 1 tablet (80 mg total) by mouth daily., Disp: 90 tablet, Rfl: 3 .  Crisaborole (EUCRISA) 2 % OINT, Apply to the wrist and feet QD-BID PRN, Disp: 60 g, Rfl: 3 .  famotidine (PEPCID) 20 MG tablet, TAKE 1 TABLET (20 MG TOTAL) BY MOUTH 2 (TWO) TIMES DAILY AS NEEDED., Disp: 180 tablet, Rfl: 3 .  fexofenadine (ALLEGRA) 180 MG tablet, Take 180 mg by mouth daily. PRN, Disp: , Rfl:  .  fluticasone (FLONASE) 50 MCG/ACT nasal spray, SPRAY 2 SPRAYS INTO EACH NOSTRIL EVERY DAY, Disp: 48 mL, Rfl: 3 .  hydrocortisone 2.5 % cream, Apply to aa's face QOD alternating with Ketoconazole cream, Disp: 30 g, Rfl: 3 .  ketoconazole (NIZORAL) 2 % cream, Apply to aa's face QOD alternating with Hydrocortisone cream, Disp: 30 g, Rfl: 3 .  ketoconazole (NIZORAL) 2 % shampoo, Shampoo into scalp let sit 10 minutes then wash out. Use QW., Disp: 120 mL, Rfl: 3 .  meclizine (ANTIVERT) 25 MG tablet, Take 1 tablet (25 mg total) by mouth 2 (two) times daily as needed for dizziness., Disp: 30 tablet, Rfl: 0 .  montelukast (SINGULAIR) 10 MG tablet, TAKE 1 TABLET BY MOUTH EVERYDAY AT BEDTIME, Disp: 90 tablet, Rfl: 1 .  naproxen (NAPROSYN) 500 MG tablet, Take 1 tablet (500 mg total) by mouth 2 (two) times daily with a meal., Disp: 20 tablet, Rfl: 0 .  omeprazole (PRILOSEC) 20 MG capsule, Take 1 capsule (20 mg total) by mouth 2 (two) times daily before a meal.,  Disp: 60 capsule, Rfl: 0 .  topiramate (TOPAMAX) 200 MG tablet, Take 200 mg by mouth 2 (two) times daily. , Disp: , Rfl: 11   Allergies  Allergen Reactions  . Clindamycin/Lincomycin Hives  . Hydrocodone Nausea Only  . Metronidazole Hives  . Sulfa Antibiotics Hives    Break out in hives all over  . Oxycodone Nausea And Vomiting     Social History   Tobacco Use  . Smoking status: Never Smoker  . Smokeless tobacco: Never Used  Vaping Use  . Vaping Use: Never used  Substance Use Topics  . Alcohol use: Yes    Alcohol/week: 0.0 standard drinks    Comment: occ  .  Drug use: Never      Chart Review Today: ***  Review of Systems     Objective:   There were no vitals filed for this visit.  There is no height or weight on file to calculate BMI.  Physical Exam   Results for orders placed or performed during the hospital encounter of 08/05/20  CBC  Result Value Ref Range   WBC 6.5 4.0 - 10.5 K/uL   RBC 4.06 3.87 - 5.11 MIL/uL   Hemoglobin 13.3 12.0 - 15.0 g/dL   HCT 37.3 36 - 46 %   MCV 91.9 80.0 - 100.0 fL   MCH 32.8 26.0 - 34.0 pg   MCHC 35.7 30.0 - 36.0 g/dL   RDW 12.9 11.5 - 15.5 %   Platelets 263 150 - 400 K/uL   nRBC 0.0 0.0 - 0.2 %  Comprehensive metabolic panel  Result Value Ref Range   Sodium 144 135 - 145 mmol/L   Potassium 3.3 (L) 3.5 - 5.1 mmol/L   Chloride 114 (H) 98 - 111 mmol/L   CO2 22 22 - 32 mmol/L   Glucose, Bld 99 70 - 99 mg/dL   BUN 15 6 - 20 mg/dL   Creatinine, Ser 0.79 0.44 - 1.00 mg/dL   Calcium 9.2 8.9 - 10.3 mg/dL   Total Protein 6.9 6.5 - 8.1 g/dL   Albumin 4.2 3.5 - 5.0 g/dL   AST 28 15 - 41 U/L   ALT 33 0 - 44 U/L   Alkaline Phosphatase 66 38 - 126 U/L   Total Bilirubin 0.8 0.3 - 1.2 mg/dL   GFR calc non Af Amer >60 >60 mL/min   GFR calc Af Amer >60 >60 mL/min   Anion gap 8 5 - 15  Ethanol  Result Value Ref Range   Alcohol, Ethyl (B) <10 <10 mg/dL       Assessment & Plan:   Khamryn Calderone, CMA 10/10/20 1:44 PM

## 2020-10-11 ENCOUNTER — Ambulatory Visit: Payer: Medicare Other | Admitting: Family Medicine

## 2020-10-11 DIAGNOSIS — R42 Dizziness and giddiness: Secondary | ICD-10-CM

## 2020-12-25 ENCOUNTER — Encounter: Payer: Self-pay | Admitting: Gastroenterology

## 2020-12-25 ENCOUNTER — Other Ambulatory Visit: Payer: Self-pay

## 2020-12-25 ENCOUNTER — Ambulatory Visit (INDEPENDENT_AMBULATORY_CARE_PROVIDER_SITE_OTHER): Payer: Medicare Other | Admitting: Gastroenterology

## 2020-12-25 VITALS — BP 113/71 | HR 69 | Temp 97.6°F | Ht 59.0 in | Wt 135.8 lb

## 2020-12-25 DIAGNOSIS — R197 Diarrhea, unspecified: Secondary | ICD-10-CM

## 2020-12-25 DIAGNOSIS — R109 Unspecified abdominal pain: Secondary | ICD-10-CM

## 2020-12-25 DIAGNOSIS — R1084 Generalized abdominal pain: Secondary | ICD-10-CM

## 2020-12-25 MED ORDER — OMEPRAZOLE 20 MG PO CPDR: 20.0000 mg | DELAYED_RELEASE_CAPSULE | Freq: Two times a day (BID) | ORAL | 0 refills | Status: DC

## 2020-12-25 MED ORDER — SUPREP BOWEL PREP KIT 17.5-3.13-1.6 GM/177ML PO SOLN
1.0000 | ORAL | 0 refills | Status: DC
Start: 2020-12-25 — End: 2021-02-05

## 2020-12-25 NOTE — Progress Notes (Signed)
Vonda Antigua, MD [address]  [place]  [place], [place] [address]  Main: [phone]  Fax: [phone]   Primary Care Physician: Delsa Grana, PA-C   Chief Complaint  Patient presents with  . Abdominal Pain    Bloating, nausea, early satiety, pain while eating     HPI: Elizabeth Mata is a 59 y.o. female here for follow-up of abdominal pain.  Since last visit patient states abdominal pain had improved but as of the last 1 to 2 months symptoms have returned and patient has abdominal pain with and without meals.  Reports subjective weight loss due to this as well.  Has 2-3 bowel movements a day that are soft.  No blood in stool.  No nausea or vomiting.  Is taking multiple antacids a day due to the abdominal pain which helps.  Not on any PPI.  Reports nausea but no vomiting.  No dysphagia.  Previous history: She has undergone extensive work-up including recent CTA with no evidence of acute or chronic mesenteric ischemia  EGD June 2020 showed gastric erythema Colonoscopy June 2020 with 2 mm cecum polyp removedAnd random colon biopsies obtained  DIAGNOSIS:  A. STOMACH ERYTHEMA; COLD BIOPSY:  - ANTRAL MUCOSA WITH MILD REACTIVE GASTRITIS.  - MILDLY EDEMATOUS OXYNTIC MUCOSA.  - NEGATIVE FOR H. PYLORI, DYSPLASIA, AND MALIGNANCY.   B. COLON POLYP, CECUM; COLD BIOPSY:  - FRAGMENT OF MILDLY INFLAMED COLONIC MUCOSA (MILD ACTIVE COLITIS).  - DEEPER SECTIONS EXAMINED.  - NEGATIVE FOR ARCHITECTURAL FEATURES OF CHRONICITY, VIRAL CYTOPATHIC  EFFECT, MICROSCOPIC COLITIS, DYSPLASIA, AND MALIGNANCY.   C. COLON, RANDOM; COLD BIOPSY:  - MILD EDEMATOUR COLONIC MUCOSA WITH PATCHY MINIMAL TO MILD ACTIVE  COLITIS INVOLVING A MAJORITY OF THE BIOPSY FRAGMENTS, SEE COMMENT.  - FOCAL LYMPHOID AGGREGATE.  - NEGATIVE FOR ARCHITECTURAL FEATURES OF CHRONICITY, VIRAL CYTOPATHIC  EFFECT, MICROSCOPIC COLITIS, DYSPLASIA, AND MALIGNANCY.   Comment:  Given the histologic findings in the  colon biopsies (parts B and C) a  resolving self-limited colitis, likely infectious, is favored.   GI profile including C. difficile testing was negative.  Current Outpatient Medications  Medication Sig Dispense Refill  . aspirin EC 81 MG EC tablet Take 1 tablet (81 mg total) by mouth daily. 30 tablet 1  . atorvastatin (LIPITOR) 80 MG tablet Take 1 tablet (80 mg total) by mouth daily. 90 tablet 3  . fexofenadine (ALLEGRA) 180 MG tablet Take 180 mg by mouth daily. PRN    . topiramate (TOPAMAX) 200 MG tablet Take 200 mg by mouth 2 (two) times daily.   11  . Crisaborole (EUCRISA) 2 % OINT Apply to the wrist and feet QD-BID PRN (Patient not taking: Reported on 12/25/2020) 60 g 3  . famotidine (PEPCID) 20 MG tablet TAKE 1 TABLET (20 MG TOTAL) BY MOUTH 2 (TWO) TIMES DAILY AS NEEDED. (Patient not taking: Reported on 12/25/2020) 180 tablet 3  . fluticasone (FLONASE) 50 MCG/ACT nasal spray SPRAY 2 SPRAYS INTO EACH NOSTRIL EVERY DAY (Patient not taking: Reported on 12/25/2020) 48 mL 3  . hydrocortisone 2.5 % cream Apply to aa's face QOD alternating with Ketoconazole cream (Patient not taking: Reported on 12/25/2020) 30 g 3  . ketoconazole (NIZORAL) 2 % cream Apply to aa's face QOD alternating with Hydrocortisone cream (Patient not taking: Reported on 12/25/2020) 30 g 3  . ketoconazole (NIZORAL) 2 % shampoo Shampoo into scalp let sit 10 minutes then wash out. Use QW. (Patient not taking: Reported on 12/25/2020) 120 mL 3  .  meclizine (ANTIVERT) 25 MG tablet Take 1 tablet (25 mg total) by mouth 2 (two) times daily as needed for dizziness. (Patient not taking: Reported on 12/25/2020) 30 tablet 0  . montelukast (SINGULAIR) 10 MG tablet TAKE 1 TABLET BY MOUTH EVERYDAY AT BEDTIME (Patient not taking: Reported on 12/25/2020) 90 tablet 1  . naproxen (NAPROSYN) 500 MG tablet Take 1 tablet (500 mg total) by mouth 2 (two) times daily with a meal. (Patient not taking: Reported on 12/25/2020) 20 tablet 0  . omeprazole  (PRILOSEC) 20 MG capsule Take 1 capsule (20 mg total) by mouth 2 (two) times daily before a meal. (Patient not taking: Reported on 12/25/2020) 60 capsule 0   No current facility-administered medications for this visit.    Allergies as of 12/25/2020 - Review Complete 12/25/2020  Allergen Reaction Noted  . Clindamycin/lincomycin Hives 11/10/2015  . Hydrocodone Nausea Only 03/01/2015  . Metronidazole Hives 11/07/2010  . Sulfa antibiotics Hives 07/05/2014  . Oxycodone Nausea And Vomiting 02/05/2017    ROS:  General: Negative for anorexia, weight loss, fever, chills, fatigue, weakness. ENT: Negative for hoarseness, difficulty swallowing , nasal congestion. CV: Negative for chest pain, angina, palpitations, dyspnea on exertion, peripheral edema.  Respiratory: Negative for dyspnea at rest, dyspnea on exertion, cough, sputum, wheezing.  GI: See history of present illness. GU:  Negative for dysuria, hematuria, urinary incontinence, urinary frequency, nocturnal urination.  Endo: Negative for unusual weight change.    Physical Examination:   BP 113/71   Pulse 69   Temp 97.6 F (36.4 C) (Temporal)   Ht 4\' 11"  (1.499 m)   Wt 135 lb 12.8 oz (61.6 kg)   BMI 27.43 kg/m   General: Well-nourished, well-developed in no acute distress.  Eyes: No icterus. Conjunctivae pink. Mouth: Oropharyngeal mucosa moist and pink , no lesions erythema or exudate. Neck: Supple, Trachea midline Abdomen: Bowel sounds are normal, nontender, nondistended, no hepatosplenomegaly or masses, no abdominal bruits or hernia , no rebound or guarding.   Extremities: No lower extremity edema. No clubbing or deformities. Neuro: Alert and oriented x 3.  Grossly intact. Skin: Warm and dry, no jaundice.   Psych: Alert and cooperative, normal mood and affect.   Labs: CMP     Component Value Date/Time   NA 144 08/05/2020 0533   NA 145 (H) 10/24/2015 1645   NA 144 12/31/2014 1450   K 3.3 (L) 08/05/2020 0533   K 3.6  12/31/2014 1450   CL 114 (H) 08/05/2020 0533   CL 113 (H) 12/31/2014 1450   CO2 22 08/05/2020 0533   CO2 21 12/31/2014 1450   GLUCOSE 99 08/05/2020 0533   GLUCOSE 107 (H) 12/31/2014 1450   BUN 15 08/05/2020 0533   BUN 11 10/24/2015 1645   BUN 17 12/31/2014 1450   CREATININE 0.79 08/05/2020 0533   CREATININE 0.92 05/18/2019 1137   CALCIUM 9.2 08/05/2020 0533   CALCIUM 9.4 12/31/2014 1450   PROT 6.9 08/05/2020 0533   PROT 6.5 07/21/2019 1535   PROT 7.8 05/03/2012 0903   ALBUMIN 4.2 08/05/2020 0533   ALBUMIN 4.4 07/21/2019 1535   ALBUMIN 3.8 05/03/2012 0903   AST 28 08/05/2020 0533   AST 30 05/03/2012 0903   ALT 33 08/05/2020 0533   ALT 25 05/03/2012 0903   ALKPHOS 66 08/05/2020 0533   ALKPHOS 88 05/03/2012 0903   BILITOT 0.8 08/05/2020 0533   BILITOT 0.4 07/21/2019 1535   BILITOT 0.6 05/03/2012 0903   GFRNONAA >60 08/05/2020 0533   GFRNONAA 70  05/18/2019 1137   GFRAA >60 08/05/2020 0533   GFRAA 81 05/18/2019 1137   Lab Results  Component Value Date   WBC 6.5 08/05/2020   HGB 13.3 08/05/2020   HCT 37.3 08/05/2020   MCV 91.9 08/05/2020   PLT 263 08/05/2020    Imaging Studies: No results found.  Assessment and Plan:   Elizabeth Mata is a 59 y.o. y/o female with abdominal pain here for follow-up  Due to return of symptoms and previous colon biopsies showing acute colitis with no chronic changes, would recommend repeat procedures at this time given ongoing symptoms despite multiple antacids and previous PPI treatment  In the meantime start PPI twice daily for 30 days also  (Risks of PPI use were discussed with patient including bone loss, C. Diff diarrhea, pneumonia, infections, CKD, electrolyte abnormalities.  Pt. Verbalizes understanding and chooses to continue the medication.)  I have discussed alternative options, risks & benefits,  which include, but are not limited to, bleeding, infection, perforation,respiratory complication & drug reaction.  The patient  agrees with this plan & written consent will be obtained.       Dr Vonda Antigua

## 2020-12-25 NOTE — Addendum Note (Signed)
Addended byRayann Heman E on: 12/25/2020 10:26 AM   Modules accepted: Orders

## 2020-12-26 ENCOUNTER — Other Ambulatory Visit
Admission: RE | Admit: 2020-12-26 | Discharge: 2020-12-26 | Disposition: A | Discharge status: Home / Self Care | Payer: Medicare Other | Source: Ambulatory Visit | Attending: Gastroenterology | Admitting: Gastroenterology

## 2020-12-26 DIAGNOSIS — Z01812 Encounter for preprocedural laboratory examination: Secondary | ICD-10-CM | POA: Insufficient documentation

## 2020-12-26 DIAGNOSIS — Z20822 Contact with and (suspected) exposure to covid-19: Secondary | ICD-10-CM | POA: Insufficient documentation

## 2020-12-27 ENCOUNTER — Encounter: Payer: Self-pay | Admitting: Gastroenterology

## 2020-12-27 LAB — SARS CORONAVIRUS 2 (TAT 6-24 HRS): SARS Coronavirus 2: NEGATIVE

## 2020-12-28 ENCOUNTER — Encounter: Payer: Self-pay | Admitting: Gastroenterology

## 2020-12-28 ENCOUNTER — Ambulatory Visit: Payer: Medicare Other | Admitting: Anesthesiology

## 2020-12-28 ENCOUNTER — Encounter
Admission: RE | Disposition: A | Discharge status: Home / Self Care | Payer: Self-pay | Source: Home / Self Care | Attending: Gastroenterology

## 2020-12-28 ENCOUNTER — Other Ambulatory Visit: Payer: Self-pay

## 2020-12-28 ENCOUNTER — Ambulatory Visit
Admission: RE | Admit: 2020-12-28 | Discharge: 2020-12-28 | Disposition: A | Discharge status: Home / Self Care | Payer: Medicare Other | Attending: Gastroenterology | Admitting: Gastroenterology

## 2020-12-28 DIAGNOSIS — K219 Gastro-esophageal reflux disease without esophagitis: Secondary | ICD-10-CM | POA: Diagnosis not present

## 2020-12-28 DIAGNOSIS — Z560 Unemployment, unspecified: Secondary | ICD-10-CM | POA: Insufficient documentation

## 2020-12-28 DIAGNOSIS — R109 Unspecified abdominal pain: Secondary | ICD-10-CM | POA: Diagnosis not present

## 2020-12-28 DIAGNOSIS — K3189 Other diseases of stomach and duodenum: Secondary | ICD-10-CM

## 2020-12-28 DIAGNOSIS — Z79899 Other long term (current) drug therapy: Secondary | ICD-10-CM | POA: Diagnosis not present

## 2020-12-28 DIAGNOSIS — Z881 Allergy status to other antibiotic agents status: Secondary | ICD-10-CM | POA: Diagnosis not present

## 2020-12-28 DIAGNOSIS — Z7982 Long term (current) use of aspirin: Secondary | ICD-10-CM | POA: Insufficient documentation

## 2020-12-28 DIAGNOSIS — Z882 Allergy status to sulfonamides status: Secondary | ICD-10-CM | POA: Insufficient documentation

## 2020-12-28 DIAGNOSIS — R197 Diarrhea, unspecified: Secondary | ICD-10-CM

## 2020-12-28 DIAGNOSIS — R1084 Generalized abdominal pain: Secondary | ICD-10-CM

## 2020-12-28 DIAGNOSIS — K648 Other hemorrhoids: Secondary | ICD-10-CM | POA: Diagnosis not present

## 2020-12-28 DIAGNOSIS — Z885 Allergy status to narcotic agent status: Secondary | ICD-10-CM | POA: Insufficient documentation

## 2020-12-28 DIAGNOSIS — K2289 Other specified disease of esophagus: Secondary | ICD-10-CM | POA: Insufficient documentation

## 2020-12-28 HISTORY — PX: COLONOSCOPY WITH PROPOFOL: SHX5780

## 2020-12-28 HISTORY — PX: ESOPHAGOGASTRODUODENOSCOPY (EGD) WITH PROPOFOL: SHX5813

## 2020-12-28 SURGERY — COLONOSCOPY WITH PROPOFOL
Anesthesia: General

## 2020-12-28 MED ORDER — SODIUM CHLORIDE 0.9 % IV SOLN: INTRAVENOUS | Status: DC

## 2020-12-28 MED ORDER — LIDOCAINE HCL (CARDIAC) PF 100 MG/5ML IV SOSY
PREFILLED_SYRINGE | INTRAVENOUS | Status: DC | PRN
  Administered 2020-12-28: 40 mg via INTRAVENOUS

## 2020-12-28 MED ORDER — LIDOCAINE HCL (PF) 2 % IJ SOLN
INTRAMUSCULAR | Status: AC
  Filled 2020-12-28: qty 5

## 2020-12-28 MED ORDER — PROPOFOL 500 MG/50ML IV EMUL
INTRAVENOUS | Status: AC
  Filled 2020-12-28: qty 50

## 2020-12-28 MED ORDER — PROPOFOL 500 MG/50ML IV EMUL
INTRAVENOUS | Status: DC | PRN
  Administered 2020-12-28: 120 ug/kg/min via INTRAVENOUS

## 2020-12-28 NOTE — Op Note (Signed)
[facility] Gastroenterology Patient Name: Elizabeth Mata Procedure Date: 12/28/2020 11:42 AM MRN: [id number] Account #: [id number] Date of Birth: Dec 07, 1961 Admit Type: Outpatient Age: 59 Room: [facility] ENDO ROOM 4 Gender: Female Note Status: Finalized Procedure:             Upper GI endoscopy Indications:           Abdominal pain Providers:             Delesia Martinek B. Maximino Greenland MD, MD Referring MD:          Danelle Berry (Referring MD) Medicines:             Monitored Anesthesia Care Complications:         No immediate complications. Procedure:             Pre-Anesthesia Assessment:                        - Prior to the procedure, a History and Physical was                         performed, and patient medications, allergies and                         sensitivities were reviewed. The patient's tolerance                         of previous anesthesia was reviewed.                        - The risks and benefits of the procedure and the                         sedation options and risks were discussed with the                         patient. All questions were answered and informed                         consent was obtained.                        - Patient identification and proposed procedure were                         verified prior to the procedure by the physician, the                         nurse, the anesthesiologist, the anesthetist and the                         technician. The procedure was verified in the                         procedure room.                        - ASA Grade Assessment: II - A patient with mild                         systemic disease.  After obtaining informed consent, the endoscope was                         passed under direct vision. Throughout the procedure,                         the patient's blood pressure, pulse, and oxygen                         saturations were monitored continuously. The  Endoscope                         was introduced through the mouth, and advanced to the                         second part of duodenum. The upper GI endoscopy was                         accomplished with ease. The patient tolerated the                         procedure well. Findings:      A single mucosal nodule was found at the gastroesophageal junction.       Biopsies were taken with a cold forceps for histology.      The exam of the esophagus was otherwise normal.      Patchy mildly erythematous mucosa without bleeding was found in the       gastric antrum. Biopsies were taken with a cold forceps for histology.       Biopsies were obtained in the gastric body, at the incisura and in the       gastric antrum with cold forceps for histology.      The exam of the stomach was otherwise normal.      Patchy mucosal changes characterized by discoloration were found in the       duodenal bulb and in the second portion of the duodenum. Biopsies were       taken with a cold forceps for histology.      The exam of the duodenum was otherwise normal. Impression:            - Mucosal nodule found in the esophagus. Biopsied.                        - Erythematous mucosa in the antrum. Biopsied.                        - Mucosal changes in the duodenum. Biopsied.                        - Biopsies were obtained in the gastric body, at the                         incisura and in the gastric antrum. Recommendation:        - Await pathology results.                        - Discharge patient to home (with escort).                        -  Advance diet as tolerated.                        - Continue present medications.                        - Patient has a contact number available for                         emergencies. The signs and symptoms of potential                         delayed complications were discussed with the patient.                         Return to normal activities tomorrow.  Written                         discharge instructions were provided to the patient.                        - Discharge patient to home (with escort).                        - The findings and recommendations were discussed with                         the patient.                        - The findings and recommendations were discussed with                         the patient's family.                        - Avoid NSAIDs except Aspirin if medically indicated                         by PCP Procedure Code(s):     --- Professional ---                        [phone], Esophagogastroduodenoscopy, flexible,                         transoral; with biopsy, single or multiple Diagnosis Code(s):     --- Professional ---                        K22.8, Other specified diseases of esophagus                        K31.89, Other diseases of stomach and duodenum                        R10.9, Unspecified abdominal pain CPT copyright 2019 American Medical Association. All rights reserved. The codes documented in this report are preliminary and upon coder review may  be revised to meet current compliance requirements.  Vonda Antigua, MD Margretta Sidle B. Makylie Rivere MD, MD 12/28/2020 12:02:27 PM This report has been signed electronically. Number of Addenda: 0  Note Initiated On: 12/28/2020 11:42 AM Estimated Blood Loss:  Estimated blood loss: none.      [facility]

## 2020-12-28 NOTE — Anesthesia Procedure Notes (Signed)
Performed by: Cook-Martin, Alon Mazor Pre-anesthesia Checklist: Patient identified, Emergency Drugs available, Suction available, Patient being monitored and Timeout performed Patient Re-evaluated:Patient Re-evaluated prior to induction Oxygen Delivery Method: Nasal cannula Preoxygenation: Pre-oxygenation with 100% oxygen Induction Type: IV induction Airway Equipment and Method: Bite block Placement Confirmation: positive ETCO2 and CO2 detector       

## 2020-12-28 NOTE — Op Note (Signed)
[facility] Gastroenterology Patient Name: Elizabeth Mata Procedure Date: 12/28/2020 11:41 AM MRN: [id number] Account #: [id number] Date of Birth: August 10, 1961 Admit Type: Outpatient Age: 59 Room: [facility] ENDO ROOM 4 Gender: Female Note Status: Finalized Procedure:             Colonoscopy Indications:           Abdominal pain, Clinically significant diarrhea of                         unexplained origin Providers:             Francesco Provencal B. Bonna Gains MD, MD Referring MD:          Delsa Grana (Referring MD) Medicines:             Monitored Anesthesia Care Complications:         No immediate complications. Procedure:             Pre-Anesthesia Assessment:                        - Prior to the procedure, a History and Physical was                         performed, and patient medications, allergies and                         sensitivities were reviewed. The patient's tolerance                         of previous anesthesia was reviewed.                        - The risks and benefits of the procedure and the                         sedation options and risks were discussed with the                         patient. All questions were answered and informed                         consent was obtained.                        - Patient identification and proposed procedure were                         verified prior to the procedure by the physician, the                         nurse, the anesthetist and the technician. The                         procedure was verified in the pre-procedure area in                         the procedure room in the endoscopy suite.                        - ASA Grade Assessment:  II - A patient with mild                         systemic disease.                        - After reviewing the risks and benefits, the patient                         was deemed in satisfactory condition to undergo the                         procedure.                         After obtaining informed consent, the colonoscope was                         passed under direct vision. Throughout the procedure,                         the patient's blood pressure, pulse, and oxygen                         saturations were monitored continuously. The                         Colonoscope was introduced through the anus and                         advanced to the the cecum, identified by appendiceal                         orifice and ileocecal valve. The colonoscopy was                         performed with ease. The patient tolerated the                         procedure well. The quality of the bowel preparation                         was good. Findings:      The perianal and digital rectal examinations were normal.      The rectum, sigmoid colon, descending colon, transverse colon, ascending       colon and cecum appeared normal. Biopsies for histology were taken with       a cold forceps from the entire colon for evaluation of microscopic       colitis.      Non-bleeding internal hemorrhoids were found during retroflexion.      No additional abnormalities were found on retroflexion. Impression:            - The rectum, sigmoid colon, descending colon,                         transverse colon, ascending colon and cecum are                         normal. Biopsied.                        -  Non-bleeding internal hemorrhoids. Recommendation:        - Await pathology results.                        - Discharge patient to home.                        - Resume previous diet.                        - Continue present medications.                        - Return to primary care physician as previously                         scheduled.                        - The findings and recommendations were discussed with                         the patient.                        - The findings and recommendations were discussed with                         the  patient's family. Procedure Code(s):     --- Professional ---                        [phone], Colonoscopy, flexible; with biopsy, single or                         multiple Diagnosis Code(s):     --- Professional ---                        R10.9, Unspecified abdominal pain                        R19.7, Diarrhea, unspecified CPT copyright 2019 American Medical Association. All rights reserved. The codes documented in this report are preliminary and upon coder review may  be revised to meet current compliance requirements.  Melodie Bouillon, MD Michel Bickers B. Maximino Greenland MD, MD 12/28/2020 12:27:35 PM This report has been signed electronically. Number of Addenda: 0 Note Initiated On: 12/28/2020 11:41 AM Scope Withdrawal Time: 0 hours 10 minutes 28 seconds  Total Procedure Duration: 0 hours 17 minutes 50 seconds  Estimated Blood Loss:  Estimated blood loss: none.      [facility]

## 2020-12-28 NOTE — Anesthesia Preprocedure Evaluation (Signed)
Anesthesia Evaluation  Patient identified by MRN, date of birth, ID band Patient awake    Reviewed: Allergy & Precautions, H&P , NPO status , Patient's Chart, lab work & pertinent test results, reviewed documented beta blocker date and time   Airway Mallampati: II   Neck ROM: full    Dental  (+) Poor Dentition   Pulmonary neg pulmonary ROS,    Pulmonary exam normal        Cardiovascular Exercise Tolerance: Good negative cardio ROS Normal cardiovascular exam Rhythm:regular Rate:Normal     Neuro/Psych  Headaches, Seizures -, Well Controlled,  TIA Neuromuscular disease negative psych ROS   GI/Hepatic Neg liver ROS, GERD  Medicated,  Endo/Other  negative endocrine ROS  Renal/GU Renal disease  negative genitourinary   Musculoskeletal   Abdominal   Peds  Hematology negative hematology ROS (+)   Anesthesia Other Findings Past Medical History: 06/30/2016: Allergic rhinitis No date: Allergy 11/06/2017: Carotid atherosclerosis, bilateral     Comment:  [place] Nov 2018 No date: Cyst of breast, left, solitary No date: Flank pain No date: GERD (gastroesophageal reflux disease) No date: Hemorrhoid No date: Hyperlipidemia 07/18/2016: IFG (impaired fasting glucose)     Comment:  June 2017  No date: Kidney stones 07/24/2016: Neuropathy No date: Seizures (HCC) 02/03/2017: Shingles outbreak No date: Syncope     Comment:  Recurrent over the last 2 years. had workup at [facility] this               year with cardiology and neurology. Had echo and 3-week               event monitor that were unremarkable. Has had negative               EEG. Echo in 11/11 at [facility] showed no significant               abnormalities. Possibly vasovagal No date: Trigger thumb Past Surgical History: 40s: ABDOMINAL HYSTERECTOMY     Comment:  endometriosis, fibroid tumors; no cancer, partial 1980'S: BREAST EXCISIONAL BIOPSY; Left     Comment:  EXCISIONAL - NEG No  date: BREAST SURGERY 31: CHOLECYSTECTOMY 06/23/2019: COLONOSCOPY WITH PROPOFOL; N/A     Comment:  Procedure: COLONOSCOPY WITH PROPOFOL;  Surgeon:               Pasty Spillers, MD;  Location: [facility] ENDOSCOPY;                Service: Endoscopy;  Laterality: N/A; 06/23/2019: ESOPHAGOGASTRODUODENOSCOPY (EGD) WITH PROPOFOL; N/A     Comment:  Procedure: ESOPHAGOGASTRODUODENOSCOPY (EGD) WITH               PROPOFOL;  Surgeon: Pasty Spillers, MD;  Location:               [facility] ENDOSCOPY;  Service: Endoscopy;  Laterality: N/A; No date: FOOT SURGERY; Right     Comment:  tendon release BMI    Body Mass Index: 27.06 kg/m     Reproductive/Obstetrics negative OB ROS                             Anesthesia Physical Anesthesia Plan  ASA: III  Anesthesia Plan: General   Post-op Pain Management:    Induction:   PONV Risk Score and Plan:   Airway Management Planned:   Additional Equipment:   Intra-op Plan:   Post-operative Plan:   Informed Consent: I have reviewed the  patients History and Physical, chart, labs and discussed the procedure including the risks, benefits and alternatives for the proposed anesthesia with the patient or authorized representative who has indicated his/her understanding and acceptance.     Dental Advisory Given  Plan Discussed with: CRNA  Anesthesia Plan Comments:         Anesthesia Quick Evaluation

## 2020-12-28 NOTE — H&P (Signed)
Vonda Antigua, MD [address], [place], [place], [place], [address] [address], [place], [place], [place], [address] Phone: [phone]  Fax: [phone]  Primary Care Physician:  Delsa Grana, PA-C   Pre-Procedure History & Physical: HPI:  Elizabeth Mata is a 59 y.o. female is here for a colonoscopy and EGD.   Past Medical History:  Diagnosis Date  . Allergic rhinitis 06/30/2016  . Allergy   . Carotid atherosclerosis, bilateral 11/06/2017   [place] Nov 2018  . Cyst of breast, left, solitary   . Flank pain   . GERD (gastroesophageal reflux disease)   . Hemorrhoid   . Hyperlipidemia   . IFG (impaired fasting glucose) 07/18/2016   June 2017   . Kidney stones   . Neuropathy 07/24/2016  . Seizures ([place])   . Shingles outbreak 02/03/2017  . Syncope    Recurrent over the last 2 years. had workup at [facility] this year with cardiology and neurology. Had echo and 3-week event monitor that were unremarkable. Has had negative EEG. Echo in 11/11 at [facility] showed no significant abnormalities. Possibly vasovagal  . Trigger thumb     Past Surgical History:  Procedure Laterality Date  . ABDOMINAL HYSTERECTOMY  40s   endometriosis, fibroid tumors; no cancer, partial  . BREAST EXCISIONAL BIOPSY Left 1980'S   EXCISIONAL - NEG  . BREAST SURGERY    . CHOLECYSTECTOMY  31  . COLONOSCOPY WITH PROPOFOL N/A 06/23/2019   Procedure: COLONOSCOPY WITH PROPOFOL;  Surgeon: Virgel Manifold, MD;  Location: [facility] ENDOSCOPY;  Service: Endoscopy;  Laterality: N/A;  . ESOPHAGOGASTRODUODENOSCOPY (EGD) WITH PROPOFOL N/A 06/23/2019   Procedure: ESOPHAGOGASTRODUODENOSCOPY (EGD) WITH PROPOFOL;  Surgeon: Virgel Manifold, MD;  Location: [facility] ENDOSCOPY;  Service: Endoscopy;  Laterality: N/A;  . FOOT SURGERY Right    tendon release    Prior to Admission medications   Medication Sig Start Date End Date Taking? Authorizing Provider  atorvastatin (LIPITOR) 80 MG tablet Take 1 tablet (80 mg total) by mouth  daily. 08/23/20  Yes Delsa Grana, PA-C  Crisaborole (EUCRISA) 2 % OINT Apply to the wrist and feet QD-BID PRN 06/19/20  Yes Ralene Bathe, MD  fexofenadine (ALLEGRA) 180 MG tablet Take 180 mg by mouth daily. PRN   Yes [provider]  omeprazole (PRILOSEC) 20 MG capsule Take 1 capsule (20 mg total) by mouth 2 (two) times daily before a meal. 12/25/20  Yes Latajah Thuman B, MD  topiramate (TOPAMAX) 200 MG tablet Take 200 mg by mouth 2 (two) times daily.  01/23/17  Yes [provider]  aspirin EC 81 MG EC tablet Take 1 tablet (81 mg total) by mouth daily. 10/10/17   Henreitta Leber, MD  famotidine (PEPCID) 20 MG tablet TAKE 1 TABLET (20 MG TOTAL) BY MOUTH 2 (TWO) TIMES DAILY AS NEEDED. Patient not taking: Reported on 12/25/2020 05/16/20   Delsa Grana, PA-C  fluticasone [facility]) 50 MCG/ACT nasal spray SPRAY 2 SPRAYS INTO EACH NOSTRIL EVERY DAY Patient not taking: Reported on 12/25/2020 08/27/19   Hubbard Hartshorn, FNP  hydrocortisone 2.5 % cream Apply to aa's face QOD alternating with Ketoconazole cream Patient not taking: Reported on 12/25/2020 06/19/20   Ralene Bathe, MD  ketoconazole (NIZORAL) 2 % cream Apply to aa's face QOD alternating with Hydrocortisone cream Patient not taking: Reported on 12/25/2020 06/19/20   Ralene Bathe, MD  ketoconazole (NIZORAL) 2 % shampoo Shampoo into scalp let sit 10 minutes then wash out. Use QW. Patient not taking:  Reported on 12/25/2020 06/19/20   Ralene Bathe, MD  meclizine (ANTIVERT) 25 MG tablet Take 1 tablet (25 mg total) by mouth 2 (two) times daily as needed for dizziness. Patient not taking: Reported on 12/25/2020 08/05/20   Sherrie George B, FNP  montelukast (SINGULAIR) 10 MG tablet TAKE 1 TABLET BY MOUTH EVERYDAY AT BEDTIME Patient not taking: Reported on 12/25/2020 05/16/20   Delsa Grana, PA-C  Na Sulfate-K Sulfate-Mg Sulf (SUPREP BOWEL PREP KIT) 17.5-3.13-1.6 GM/177ML SOLN Take 1 kit by mouth as directed. 12/25/20    Lucilla Lame, MD  naproxen (NAPROSYN) 500 MG tablet Take 1 tablet (500 mg total) by mouth 2 (two) times daily with a meal. Patient not taking: Reported on 12/25/2020 08/09/19   Hubbard Hartshorn, FNP    Allergies as of 12/25/2020 - Review Complete 12/25/2020  Allergen Reaction Noted  . Clindamycin/lincomycin Hives 11/10/2015  . Hydrocodone Nausea Only 03/01/2015  . Metronidazole Hives 11/07/2010  . Sulfa antibiotics Hives 07/05/2014  . Oxycodone Nausea And Vomiting 02/05/2017    Family History  Problem Relation Age of Onset  . Coronary artery disease Maternal Uncle   . Diabetes Mellitus II Mother   . Arthritis Mother   . Diabetes Maternal Uncle   . Kidney cancer Father 42       died 04/30/17  . Kidney disease Father   . Cancer Father        kidney and bladder  . Arthritis Maternal Uncle   . Cancer Maternal Grandmother        stomach?  . Jaundice Maternal Grandmother   . Stomach cancer Paternal Grandmother   . Cancer Paternal Grandmother        stomach  . Hypertension Sister   . Stroke Maternal Grandfather   . Diabetes Paternal Grandfather   . Diabetes Sister   . Hypertension Sister   . Breast cancer Neg Hx   . Bladder Cancer Neg Hx     Social History   Socioeconomic History  . Marital status: Legally Separated    Spouse name: Not on file  . Number of children: 2  . Years of education: Not on file  . Highest education level: GED or equivalent  Occupational History  . Occupation: unemployed  Tobacco Use  . Smoking status: Never Smoker  . Smokeless tobacco: Never Used  Vaping Use  . Vaping Use: Never used  Substance and Sexual Activity  . Alcohol use: Yes    Alcohol/week: 0.0 standard drinks    Comment: none last 72hrs  . Drug use: Never  . Sexual activity: Yes    Partners: Male  Other Topics Concern  . Not on file  Social History Narrative   Regular exercise   Social Determinants of Health   Financial Resource Strain: Low Risk   . Difficulty of  Paying Living Expenses: Not very hard  Food Insecurity: No Food Insecurity  . Worried About Charity fundraiser in the Last Year: Never true  . Ran Out of Food in the Last Year: Never true  Transportation Needs: No Transportation Needs  . Lack of Transportation (Medical): No  . Lack of Transportation (Non-Medical): No  Physical Activity: Insufficiently Active  . Days of Exercise per Week: 2 days  . Minutes of Exercise per Session: 30 min  Stress: No Stress Concern Present  . Feeling of Stress : Not at all  Social Connections: Moderately Isolated  . Frequency of Communication with Friends and Family: More than three times a  week  . Frequency of Social Gatherings with Friends and Family: Three times a week  . Attends Religious Services: More than 4 times per year  . Active Member of Clubs or Organizations: No  . Attends Archivist Meetings: Never  . Marital Status: Separated  Intimate Partner Violence: Not At Risk  . Fear of Current or Ex-Partner: No  . Emotionally Abused: No  . Physically Abused: No  . Sexually Abused: No    Review of Systems: See HPI, otherwise negative ROS  Physical Exam: BP (!) 127/94   Pulse 89   Temp 97.8 F (36.6 C) (Tympanic)   Resp 16   Ht 4' 11"  (1.499 m)   Wt 60.8 kg   SpO2 100%   BMI 27.06 kg/m  General:   Alert,  pleasant and cooperative in NAD Head:  Normocephalic and atraumatic. Neck:  Supple; no masses or thyromegaly. Lungs:  Clear throughout to auscultation, normal respiratory effort.    Heart:  +S1, +S2, Regular rate and rhythm, No edema. Abdomen:  Soft, nontender and nondistended. Normal bowel sounds, without guarding, and without rebound.   Neurologic:  Alert and  oriented x4;  grossly normal neurologically.  Impression/Plan: Elizabeth Mata is here for a colonoscopy to be performed for colitis and EGD for abdominal pain and diarrhea  Risks, benefits, limitations, and alternatives regarding the procedures have been  reviewed with the patient.  Questions have been answered.  All parties agreeable.   Virgel Manifold, MD  12/28/2020, 11:31 AM

## 2020-12-28 NOTE — Transfer of Care (Signed)
Immediate Anesthesia Transfer of Care Note  Patient: Elizabeth Mata  Procedure(s) Performed: COLONOSCOPY WITH PROPOFOL (N/A ) ESOPHAGOGASTRODUODENOSCOPY (EGD) WITH PROPOFOL (N/A )  Patient Location: PACU  Anesthesia Type:General  Level of Consciousness: awake and sedated  Airway & Oxygen Therapy: Patient Spontanous Breathing and Patient connected to nasal cannula oxygen  Post-op Assessment: Report given to RN and Post -op Vital signs reviewed and stable  Post vital signs: Reviewed and stable  Last Vitals:  Vitals Value Taken Time  BP    Temp    Pulse    Resp    SpO2      Last Pain:  Vitals:   12/28/20 1057  TempSrc: Tympanic  PainSc: 0-No pain         Complications: No complications documented.

## 2021-01-02 LAB — SURGICAL PATHOLOGY

## 2021-01-03 ENCOUNTER — Telehealth: Payer: Self-pay

## 2021-01-03 MED ORDER — OMEPRAZOLE 20 MG PO CPDR: 20.0000 mg | DELAYED_RELEASE_CAPSULE | Freq: Two times a day (BID) | ORAL | 0 refills | Status: DC

## 2021-01-03 NOTE — Telephone Encounter (Signed)
-----   Message from Pasty Spillers, MD sent at 01/02/2021  2:07 PM EST ----- Elizabeth Mata please let the patient know, her biopsies were benign.  Due to her symptoms if she still has not had improvement in symptoms, increase omeprazole to twice daily for 30 days, please send new prescription.  Follow-up with me virtual visit in 4 to 6 weeks

## 2021-01-03 NOTE — Telephone Encounter (Signed)
Called patient to let her know that her biopsies were benign and if she continued with her symptoms, then to take Omeprazole twice a day for the next 30 days and to follow up with her by phone in 4 to 6 weeks. Patient agreed with recommendations since she continues to have symptoms. Patient also agreed on a virtual follow up appointment.

## 2021-01-04 NOTE — Anesthesia Postprocedure Evaluation (Signed)
Anesthesia Post Note  Patient: Elizabeth Mata  Procedure(s) Performed: COLONOSCOPY WITH PROPOFOL (N/A ) ESOPHAGOGASTRODUODENOSCOPY (EGD) WITH PROPOFOL (N/A )  Patient location during evaluation: PACU Anesthesia Type: General Level of consciousness: awake and alert Pain management: pain level controlled Vital Signs Assessment: post-procedure vital signs reviewed and stable Respiratory status: spontaneous breathing, nonlabored ventilation, respiratory function stable and patient connected to nasal cannula oxygen Cardiovascular status: blood pressure returned to baseline and stable Postop Assessment: no apparent nausea or vomiting Anesthetic complications: no   No complications documented.   Last Vitals:  Vitals:   12/28/20 1245 12/28/20 1255  BP: 124/82 106/83  Pulse: 62 70  Resp: 15 17  Temp:    SpO2: 100% 99%    Last Pain:  Vitals:   12/29/20 1354  TempSrc:   PainSc: 0-No pain                 Yevette Edwards

## 2021-01-17 ENCOUNTER — Other Ambulatory Visit: Payer: Self-pay | Admitting: Gastroenterology

## 2021-02-05 ENCOUNTER — Encounter: Payer: Self-pay | Admitting: Gastroenterology

## 2021-02-05 ENCOUNTER — Telehealth (INDEPENDENT_AMBULATORY_CARE_PROVIDER_SITE_OTHER): Payer: Medicare Other | Admitting: Gastroenterology

## 2021-02-05 ENCOUNTER — Other Ambulatory Visit: Payer: Self-pay

## 2021-02-05 DIAGNOSIS — R109 Unspecified abdominal pain: Secondary | ICD-10-CM

## 2021-02-05 NOTE — Progress Notes (Signed)
Vonda Antigua, MD [address]  [place]  [place], [place] [address]  Main: [phone]  Fax: [phone]   Primary Care Physician: Delsa Grana, PA-C  Virtual Visit via Telephone Note  I connected with patient on 02/05/21 at 10:20 AM EST by telephone and verified that I am speaking with the correct person using two identifiers.   I discussed the limitations, risks, security and privacy concerns of performing an evaluation and management service by telephone and the availability of in person appointments. I also discussed with the patient that there may be a patient responsible charge related to this service. The patient expressed understanding and agreed to proceed.  Location of Patient: Home Location of Provider: Home Persons involved: Patient and provider only during the visit (nursing staff and front desk staff was involved in communicating with the patient prior to the appointment, reviewing medications and checking them in). Patient unable to connect via MyChart and therefore visit changed to a phone visit.   History of Present Illness: Chief Complaint  Patient presents with  . Abdominal Pain     HPI: Elizabeth Mata is a 60 y.o. female here for follow-up of abdominal pain. Patient reports resolution of symptoms with PPI twice daily at this time. Patient underwent EGD and colonoscopy recently with no evidence of colitis on colonoscopy and biopsies. EGD did not show any H. pylori. Reactive gastropathy was noted on biopsies. No nausea or vomiting. No weight loss. No postprandial pain.  Current Outpatient Medications  Medication Sig Dispense Refill  . aspirin EC 81 MG EC tablet Take 1 tablet (81 mg total) by mouth daily. 30 tablet 1  . atorvastatin (LIPITOR) 80 MG tablet Take 1 tablet (80 mg total) by mouth daily. 90 tablet 3  . Crisaborole (EUCRISA) 2 % OINT Apply to the wrist and feet QD-BID PRN 60 g 3  . fexofenadine (ALLEGRA) 180 MG tablet Take 180 mg by  mouth daily. PRN    . hydrocortisone 2.5 % cream Apply to aa's face QOD alternating with Ketoconazole cream 30 g 3  . ketoconazole (NIZORAL) 2 % cream Apply to aa's face QOD alternating with Hydrocortisone cream 30 g 3  . ketoconazole (NIZORAL) 2 % shampoo Shampoo into scalp let sit 10 minutes then wash out. Use QW. 120 mL 3  . meclizine (ANTIVERT) 25 MG tablet Take 1 tablet (25 mg total) by mouth 2 (two) times daily as needed for dizziness. 30 tablet 0  . omeprazole (PRILOSEC) 20 MG capsule Take 1 capsule (20 mg total) by mouth 2 (two) times daily before a meal. 60 capsule 0  . topiramate (TOPAMAX) 200 MG tablet Take 200 mg by mouth 2 (two) times daily.   11  . famotidine (PEPCID) 20 MG tablet TAKE 1 TABLET (20 MG TOTAL) BY MOUTH 2 (TWO) TIMES DAILY AS NEEDED. (Patient not taking: Reported on 02/05/2021) 180 tablet 3  . fluticasone (FLONASE) 50 MCG/ACT nasal spray SPRAY 2 SPRAYS INTO EACH NOSTRIL EVERY DAY (Patient not taking: Reported on 02/05/2021) 48 mL 3   No current facility-administered medications for this visit.    Allergies as of 02/05/2021 - Review Complete 02/05/2021  Allergen Reaction Noted  . Clindamycin/lincomycin Hives 11/10/2015  . Hydrocodone Nausea Only 03/01/2015  . Metronidazole Hives 11/07/2010  . Sulfa antibiotics Hives 07/05/2014  . Oxycodone Nausea And Vomiting 02/05/2017    Review of Systems:    All systems reviewed and negative except where noted in HPI.   Observations/Objective:  Labs: CMP  Component Value Date/Time   NA 144 08/05/2020 0533   NA 145 (H) 10/24/2015 1645   NA 144 12/31/2014 1450   K 3.3 (L) 08/05/2020 0533   K 3.6 12/31/2014 1450   CL 114 (H) 08/05/2020 0533   CL 113 (H) 12/31/2014 1450   CO2 22 08/05/2020 0533   CO2 21 12/31/2014 1450   GLUCOSE 99 08/05/2020 0533   GLUCOSE 107 (H) 12/31/2014 1450   BUN 15 08/05/2020 0533   BUN 11 10/24/2015 1645   BUN 17 12/31/2014 1450   CREATININE 0.79 08/05/2020 0533   CREATININE 0.92  05/18/2019 1137   CALCIUM 9.2 08/05/2020 0533   CALCIUM 9.4 12/31/2014 1450   PROT 6.9 08/05/2020 0533   PROT 6.5 07/21/2019 1535   PROT 7.8 05/03/2012 0903   ALBUMIN 4.2 08/05/2020 0533   ALBUMIN 4.4 07/21/2019 1535   ALBUMIN 3.8 05/03/2012 0903   AST 28 08/05/2020 0533   AST 30 05/03/2012 0903   ALT 33 08/05/2020 0533   ALT 25 05/03/2012 0903   ALKPHOS 66 08/05/2020 0533   ALKPHOS 88 05/03/2012 0903   BILITOT 0.8 08/05/2020 0533   BILITOT 0.4 07/21/2019 1535   BILITOT 0.6 05/03/2012 0903   GFRNONAA >60 08/05/2020 0533   GFRNONAA 70 05/18/2019 1137   GFRAA >60 08/05/2020 0533   GFRAA 81 05/18/2019 1137   Lab Results  Component Value Date   WBC 6.5 08/05/2020   HGB 13.3 08/05/2020   HCT 37.3 08/05/2020   MCV 91.9 08/05/2020   PLT 263 08/05/2020    Imaging Studies: No results found.  Assessment and Plan:   Elizabeth Mata is a 60 y.o. y/o female here for follow-up of abdominal pain  Assessment and Plan: Patient has responded well to omeprazole 20 mg twice daily with resolution of symptoms at this time  I have advised her to continue the PPI twice daily for 1 more month, which would be total of 2 months of therapy, and then decrease to once a day. If no recurrence in symptoms, patient was advised to completely discontinue the medication at that point.  If symptoms recur during this time patient was advised to call us back and she verbalized understanding  No alarm symptoms present at this time   Follow Up Instructions:    I discussed the assessment and treatment plan with the patient. The patient was provided an opportunity to ask questions and all were answered. The patient agreed with the plan and demonstrated an understanding of the instructions.   The patient was advised to call back or seek an in-person evaluation if the symptoms worsen or if the condition fails to improve as anticipated.  I provided 12 minutes of non-face-to-face time during this  encounter. Additional time was spent in reviewing patient's chart, placing orders etc.   Virgel Manifold, MD  Speech recognition software was used to dictate this note.

## 2021-02-05 NOTE — Patient Instructions (Signed)
Please take Omeprazole twice a day for 4 weeks and then then take it once a day for 4 weeks and then stop.

## 2021-02-15 ENCOUNTER — Other Ambulatory Visit: Payer: Self-pay | Admitting: Gastroenterology

## 2021-02-15 NOTE — Telephone Encounter (Signed)
Dr. Bonna Gains said to drop to down to 1 a day after 1 month

## 2021-03-13 ENCOUNTER — Telehealth: Payer: Self-pay

## 2021-03-13 NOTE — Telephone Encounter (Signed)
Called patient and left her a voicemail letting her know that we need to reschedule her appointment from May 6th with Dr. Bonna Gains to another day since she will be out on [place].

## 2021-04-13 ENCOUNTER — Other Ambulatory Visit: Payer: Self-pay | Admitting: Neurology

## 2021-04-13 DIAGNOSIS — I6381 Other cerebral infarction due to occlusion or stenosis of small artery: Secondary | ICD-10-CM

## 2021-04-13 DIAGNOSIS — I6522 Occlusion and stenosis of left carotid artery: Secondary | ICD-10-CM

## 2021-04-13 DIAGNOSIS — I6529 Occlusion and stenosis of unspecified carotid artery: Secondary | ICD-10-CM

## 2021-04-30 ENCOUNTER — Ambulatory Visit
Admission: RE | Admit: 2021-04-30 | Discharge: 2021-04-30 | Disposition: A | Discharge status: Home / Self Care | Payer: Medicare Other | Source: Ambulatory Visit | Attending: Neurology | Admitting: Neurology

## 2021-04-30 ENCOUNTER — Other Ambulatory Visit: Payer: Self-pay

## 2021-04-30 DIAGNOSIS — I6381 Other cerebral infarction due to occlusion or stenosis of small artery: Secondary | ICD-10-CM

## 2021-04-30 DIAGNOSIS — I6522 Occlusion and stenosis of left carotid artery: Secondary | ICD-10-CM | POA: Diagnosis present

## 2021-04-30 DIAGNOSIS — I6529 Occlusion and stenosis of unspecified carotid artery: Secondary | ICD-10-CM | POA: Diagnosis present

## 2021-04-30 MED ORDER — GADOBUTROL 1 MMOL/ML IV SOLN
6.0000 mL | Freq: Once | INTRAVENOUS | Status: AC | PRN
  Administered 2021-04-30: 6 mL via INTRAVENOUS

## 2021-05-01 ENCOUNTER — Ambulatory Visit: Payer: Medicare Other

## 2021-05-07 ENCOUNTER — Ambulatory Visit: Payer: Medicare Other | Admitting: Gastroenterology

## 2021-05-08 ENCOUNTER — Ambulatory Visit (INDEPENDENT_AMBULATORY_CARE_PROVIDER_SITE_OTHER): Payer: Medicare Other

## 2021-05-08 DIAGNOSIS — Z0000 Encounter for general adult medical examination without abnormal findings: Secondary | ICD-10-CM

## 2021-05-08 NOTE — Progress Notes (Addendum)
Subjective:   Elizabeth Mata is a 60 y.o. female who presents for Medicare Annual (Subsequent) preventive examination.  Virtual Visit via Telephone Note  I connected with  Elizabeth Mata on 05/08/21 at  2:10 PM EDT by telephone and verified that I am speaking with the correct person using two identifiers.  Location: Patient: home Provider: Clinton Mata participating in the virtual visit: Elizabeth Mata   I discussed the limitations, risks, security and privacy concerns of performing an evaluation and management service by telephone and the availability of in person appointments. The patient expressed understanding and agreed to proceed.  Interactive audio and video telecommunications were attempted between this nurse and patient, however failed, due to patient having technical difficulties OR patient did not have access to video capability.  We continued and completed visit with audio only.  Some vital signs may be absent or patient reported.   Elizabeth Marker, LPN    Review of Systems           Objective:    Today's Vitals   05/08/21 1418  PainSc: 5    There is no height or weight on file to calculate BMI.  Advanced Directives 05/08/2021 12/28/2020 04/28/2020 06/23/2019 02/12/2019 10/27/2018 02/21/2018  Does Patient Have a Medical Advance Directive? No No No No No No No  Type of Advance Directive - - - - - - -  Does patient want to make changes to medical advance directive? - - - - - - -  Copy of [place] in Chart? - - - - - - -  Would patient like information on creating a medical advance directive? Yes (MAU/Ambulatory/Procedural Areas - Information given) No - Patient declined No - Patient declined - Yes (MAU/Ambulatory/Procedural Areas - Information given) - -    Current Medications (verified) Outpatient Encounter Medications as of 05/08/2021  Medication Sig  . aspirin EC 81 MG EC tablet Take 1 tablet (81 mg total) by mouth daily.  Marland Kitchen  atorvastatin (LIPITOR) 80 MG tablet Take 1 tablet (80 mg total) by mouth daily.  . fluticasone (FLONASE) 50 MCG/ACT nasal spray SPRAY 2 SPRAYS INTO EACH NOSTRIL EVERY DAY  . gabapentin (NEURONTIN) 100 MG capsule Take 300 mg by mouth at bedtime.  Marland Kitchen loratadine (CLARITIN) 10 MG tablet Take 10 mg by mouth daily.  Marland Kitchen topiramate (TOPAMAX) 200 MG tablet Take 200 mg by mouth 2 (two) times daily.   Marland Kitchen UBRELVY 100 MG TABS Take 1 tablet by mouth as needed.  . famotidine (PEPCID) 20 MG tablet TAKE 1 TABLET (20 MG TOTAL) BY MOUTH 2 (TWO) TIMES DAILY AS NEEDED. (Patient not taking: No sig reported)  . omeprazole (PRILOSEC) 20 MG capsule Take 1 capsule (20 mg total) by mouth daily. (Patient not taking: Reported on 05/08/2021)  . [DISCONTINUED] Crisaborole (EUCRISA) 2 % OINT Apply to the wrist and feet QD-BID PRN  . [DISCONTINUED] fexofenadine (ALLEGRA) 180 MG tablet Take 180 mg by mouth daily. PRN  . [DISCONTINUED] hydrocortisone 2.5 % cream Apply to aa's face QOD alternating with Ketoconazole cream  . [DISCONTINUED] ketoconazole (NIZORAL) 2 % cream Apply to aa's face QOD alternating with Hydrocortisone cream  . [DISCONTINUED] ketoconazole (NIZORAL) 2 % shampoo Shampoo into scalp let sit 10 minutes then wash out. Use QW.  . [DISCONTINUED] meclizine (ANTIVERT) 25 MG tablet Take 1 tablet (25 mg total) by mouth 2 (two) times daily as needed for dizziness.   No facility-administered encounter medications on file as of 05/08/2021.  Allergies (verified) Clindamycin/lincomycin, Hydrocodone, Metronidazole, Sulfa antibiotics, and Oxycodone   History: Past Medical History:  Diagnosis Date  . Allergic rhinitis 06/30/2016  . Allergy   . Carotid atherosclerosis, bilateral 11/06/2017   [place] Nov 2018  . Cyst of breast, left, solitary   . Flank pain   . GERD (gastroesophageal reflux disease)   . Hemorrhoid   . Hyperlipidemia   . IFG (impaired fasting glucose) 07/18/2016   June 2017   . Kidney stones   . Neuropathy  07/24/2016  . Seizures ([place])   . Shingles outbreak 02/03/2017  . Syncope    Recurrent over the last 2 years. had workup at [facility] this year with cardiology and neurology. Had echo and 3-week event monitor that were unremarkable. Has had negative EEG. Echo in 11/11 at [facility] showed no significant abnormalities. Possibly vasovagal  . Trigger thumb    Past Surgical History:  Procedure Laterality Date  . ABDOMINAL HYSTERECTOMY  40s   endometriosis, fibroid tumors; no cancer, partial  . BREAST EXCISIONAL BIOPSY Left 1980'S   EXCISIONAL - NEG  . BREAST SURGERY    . CHOLECYSTECTOMY  31  . COLONOSCOPY WITH PROPOFOL N/A 06/23/2019   Procedure: COLONOSCOPY WITH PROPOFOL;  Surgeon: Virgel Manifold, MD;  Location: [facility] ENDOSCOPY;  Service: Endoscopy;  Laterality: N/A;  . COLONOSCOPY WITH PROPOFOL N/A 12/28/2020   Procedure: COLONOSCOPY WITH PROPOFOL;  Surgeon: Virgel Manifold, MD;  Location: [facility] ENDOSCOPY;  Service: Endoscopy;  Laterality: N/A;  . ESOPHAGOGASTRODUODENOSCOPY (EGD) WITH PROPOFOL N/A 06/23/2019   Procedure: ESOPHAGOGASTRODUODENOSCOPY (EGD) WITH PROPOFOL;  Surgeon: Virgel Manifold, MD;  Location: [facility] ENDOSCOPY;  Service: Endoscopy;  Laterality: N/A;  . ESOPHAGOGASTRODUODENOSCOPY (EGD) WITH PROPOFOL N/A 12/28/2020   Procedure: ESOPHAGOGASTRODUODENOSCOPY (EGD) WITH PROPOFOL;  Surgeon: Virgel Manifold, MD;  Location: [facility] ENDOSCOPY;  Service: Endoscopy;  Laterality: N/A;  . FOOT SURGERY Right    tendon release   Family History  Problem Relation Age of Onset  . Coronary artery disease Maternal Uncle   . Diabetes Mellitus II Mother   . Arthritis Mother   . Diabetes Maternal Uncle   . Kidney cancer Father 75       died 2017-04-25  . Kidney disease Father   . Cancer Father        kidney and bladder  . Arthritis Maternal Uncle   . Cancer Maternal Grandmother        stomach?  . Jaundice Maternal Grandmother   . Stomach cancer Paternal Grandmother   . Cancer  Paternal Grandmother        stomach  . Hypertension Sister   . Stroke Maternal Grandfather   . Diabetes Paternal Grandfather   . Diabetes Sister   . Hypertension Sister   . Breast cancer Neg Hx   . Bladder Cancer Neg Hx    Social History   Socioeconomic History  . Marital status: Legally Separated    Spouse name: Not on file  . Number of children: 2  . Years of education: Not on file  . Highest education level: GED or equivalent  Occupational History  . Occupation: unemployed  Tobacco Use  . Smoking status: Never Smoker  . Smokeless tobacco: Never Used  Vaping Use  . Vaping Use: Never used  Substance and Sexual Activity  . Alcohol use: Not Currently    Alcohol/week: 0.0 standard drinks  . Drug use: Never  . Sexual activity: Yes    Partners: Male  Other Topics Concern  . Not on  file  Social History Narrative   Regular exercise   Social Determinants of Health   Financial Resource Strain: Low Risk   . Difficulty of Paying Living Expenses: Not very hard  Food Insecurity: No Food Insecurity  . Worried About Charity fundraiser in the Last Year: Never true  . Ran Out of Food in the Last Year: Never true  Transportation Needs: No Transportation Needs  . Lack of Transportation (Medical): No  . Lack of Transportation (Non-Medical): No  Physical Activity: Sufficiently Active  . Days of Exercise per Week: 7 days  . Minutes of Exercise per Session: 30 min  Stress: No Stress Concern Present  . Feeling of Stress : Not at all  Social Connections: Moderately Isolated  . Frequency of Communication with Friends and Family: More than three times a week  . Frequency of Social Gatherings with Friends and Family: Three times a week  . Attends Religious Services: More than 4 times per year  . Active Member of Clubs or Organizations: No  . Attends Archivist Meetings: Never  . Marital Status: Separated    Tobacco Counseling Counseling given: Not Answered   Clinical  Intake:  Pre-visit preparation completed: Yes  Pain : 0-10 Pain Score: 5  Pain Type: Acute pain Pain Location: Abdomen Pain Descriptors / Indicators: Aching,Stabbing Pain Onset: More than a month ago Pain Frequency: Intermittent     Nutritional Risks: None Diabetes: No  How often do you need to have someone help you when you read instructions, pamphlets, or other written materials from your doctor or pharmacy?: 1 - Never    Interpreter Needed?: No  Information entered by :: Elizabeth Marker LPN   Activities of Daily Living No flowsheet data found.  Patient Care Team: Delsa Grana, PA-C as PCP - General (Family Medicine) Mhoon, Larkin Ina, MD as Consulting Physician (Neurology) Ubaldo Glassing Javier Docker, MD as Consulting Physician (Cardiology)  Indicate any recent Medical Services you may have received from other than [facility] providers in the past year (date may be approximate).     Assessment:   This is a routine wellness examination for [place].  Hearing/Vision screen  Hearing Screening   125Hz  250Hz  500Hz  1000Hz  2000Hz  3000Hz  4000Hz  6000Hz  8000Hz   Right ear:           Left ear:           Comments: Pt denies hearing difficulty  Vision Screening Comments: [facility]  Dietary issues and exercise activities discussed:    Goals Addressed            This Visit's Progress   . Patient Stated       Patient states she would like to reduce caffeine intake to no more than 2-3 cups of coffee per day      Depression Screen [facility] 2/9 Scores 05/08/2021 04/28/2020 04/12/2020 02/21/2020 08/03/2019 06/01/2019 05/17/2019  PHQ - 2 Score 0 0 0 0 0 0 0  PHQ- 9 Score - - 0 0 0 0 0    Fall Risk Fall Risk  05/08/2021 04/28/2020 04/12/2020 02/21/2020 08/03/2019  Falls in the past year? 0 0 0 0 0  Number falls in past yr: 0 0 0 0 0  Injury with Fall? 0 0 0 0 0  Risk for fall due to : No Fall Risks No Fall Risks - - -  Follow up Falls prevention discussed Falls  prevention discussed - - Falls evaluation completed    FALL  RISK PREVENTION PERTAINING TO THE HOME:  Any stairs in or around the home? Yes  If so, are there any without handrails? No  Home free of loose throw rugs in walkways, pet beds, electrical cords, etc? Yes  Adequate lighting in your home to reduce risk of falls? Yes   ASSISTIVE DEVICES UTILIZED TO PREVENT FALLS:  Life alert? No  Use of a cane, walker or w/c? No  Grab bars in the bathroom? Yes  Shower chair or bench in shower? No  Elevated toilet seat or a handicapped toilet? No   TIMED UP AND GO:  Was the test performed? No . Telephonic visit.   Cognitive Function: Normal cognitive status assessed by direct observation by this Nurse Health Advisor. No abnormalities found.       6CIT Screen 02/12/2019  What Year? 0 points  What month? 0 points  What time? 0 points  Count back from 20 0 points  Months in reverse 0 points  Repeat phrase 0 points  Total Score 0    Immunizations Immunization History  Administered Date(s) Administered  . Influenza,inj,Quad PF,6+ Mos 09/13/2015, 10/08/2016, 09/26/2017, 09/15/2018, 08/26/2019  . Influenza-Unspecified 09/13/2015, 10/08/2016, 09/26/2017, 09/15/2018  . PFIZER(Purple Top)SARS-COV-2 Vaccination 03/25/2020, 04/19/2020  . Tdap 10/27/2017    TDAP status: Up to date  Flu Vaccine status: Up to date  Pneumococcal vaccine status: Declined,  Education has been provided regarding the importance of this vaccine but patient still declined. Advised may receive this vaccine at local pharmacy or Health Dept. Aware to provide a copy of the vaccination record if obtained from local pharmacy or Health Dept. Verbalized acceptance and understanding.   Covid-19 vaccine status: Completed vaccines  Qualifies for Shingles Vaccine? Yes   Zostavax completed No   Shingrix Completed?: No.    Education has been provided regarding the importance of this vaccine. Patient has been advised to call  insurance company to determine out of pocket expense if they have not yet received this vaccine. Advised may also receive vaccine at local pharmacy or Health Dept. Verbalized acceptance and understanding.  Screening Tests Health Maintenance  Topic Date Due  . COVID-19 Vaccine (3 - Pfizer risk 4-dose series) 05/17/2020  . INFLUENZA VACCINE  07/30/2021  . MAMMOGRAM  09/25/2022  . COLONOSCOPY (Pts 45-69yrs Insurance coverage will need to be confirmed)  12/28/2025  . TETANUS/TDAP  10/28/2027  . Hepatitis C Screening  Completed  . HIV Screening  Completed  . HPV VACCINES  Aged Out  . PAP SMEAR-Modifier  Discontinued    Health Maintenance  Health Maintenance Due  Topic Date Due  . COVID-19 Vaccine (3 - Pfizer risk 4-dose series) 05/17/2020    Colorectal cancer screening: Type of screening: Colonoscopy. Completed 12/28/20. Repeat every 5 years  Mammogram status: Completed 09/25/20. Repeat every year  Bone density screening: due age 62  Lung Cancer Screening: (Low Dose CT Chest recommended if Age 60-80 years, 30 pack-year currently smoking OR have quit w/in 15years.) does not qualify.   Additional Screening:  Hepatitis C Screening: does qualify; Completed 07/26/19  Vision Screening: Recommended annual ophthalmology exams for early detection of glaucoma and other disorders of the eye. Is the patient up to date with their annual eye exam?  Yes  Who is the provider or what is the name of the office in which the patient attends annual eye exams? [facility].   Dental Screening: Recommended annual dental exams for proper oral hygiene  Community Resource Referral / Chronic Care Management: CRR required  this visit?  No   CCM required this visit?  No      Plan:     I have personally reviewed and noted the following in the patient's chart:   . Medical and social history . Use of alcohol, tobacco or illicit drugs  . Current medications and supplements including opioid  prescriptions.  . Functional ability and status . Nutritional status . Physical activity . Advanced directives . List of other physicians . Hospitalizations, surgeries, and ER visits in previous 12 months . Vitals . Screenings to include cognitive, depression, and falls . Referrals and appointments  In addition, I have reviewed and discussed with patient certain preventive protocols, quality metrics, and best practice recommendations. A written personalized care plan for preventive services as well as general preventive health recommendations were provided to patient.     Elizabeth Marker, LPN   1/49/7026   Nurse Notes: pt advised due for office visit and labs; scheduled for 06/07/21.

## 2021-05-08 NOTE — Patient Instructions (Signed)
Elizabeth Mata , Thank you for taking time to come for your Medicare Wellness Visit. I appreciate your ongoing commitment to your health goals. Please review the following plan we discussed and let me know if I can assist you in the future.   Screening recommendations/referrals: Colonoscopy: done 12/28/20. Repeat in 2026 Mammogram: done 09/25/20 Bone Density: due age 60 Recommended yearly ophthalmology/optometry visit for glaucoma screening and checkup Recommended yearly dental visit for hygiene and checkup  Vaccinations: Influenza vaccine: done; need record from [place] Pneumococcal vaccine: due Tdap vaccine: done 10/27/17 Shingles vaccine: Shingrix discussed. Please contact your pharmacy for coverage information.  Covid-19: done 03/25/20 & 04/19/20  Advanced directives: Advance directive discussed with you today. I have provided a copy for you to complete at home and have notarized. Once this is complete please bring a copy in to our office so we can scan it into your chart.  Conditions/risks identified: Recommend decreasing caffeine intake   Next appointment: Follow up in one year for your annual wellness visit.   Preventive Care 40-64 Years, Female Preventive care refers to lifestyle choices and visits with your health care provider that can promote health and wellness. What does preventive care include?  A yearly physical exam. This is also called an annual well check.  Dental exams once or twice a year.  Routine eye exams. Ask your health care provider how often you should have your eyes checked.  Personal lifestyle choices, including:  Daily care of your teeth and gums.  Regular physical activity.  Eating a healthy diet.  Avoiding tobacco and drug use.  Limiting alcohol use.  Practicing safe sex.  Taking low-dose aspirin daily starting at age 5.  Taking vitamin and mineral supplements as recommended by your health care provider. What happens during an annual  well check? The services and screenings done by your health care provider during your annual well check will depend on your age, overall health, lifestyle risk factors, and family history of disease. Counseling  Your health care provider may ask you questions about your:  Alcohol use.  Tobacco use.  Drug use.  Emotional well-being.  Home and relationship well-being.  Sexual activity.  Eating habits.  Work and work Statistician.  Method of birth control.  Menstrual cycle.  Pregnancy history. Screening  You may have the following tests or measurements:  Height, weight, and BMI.  Blood pressure.  Lipid and cholesterol levels. These may be checked every 5 years, or more frequently if you are over 38 years old.  Skin check.  Lung cancer screening. You may have this screening every year starting at age 28 if you have a 30-pack-year history of smoking and currently smoke or have quit within the past 15 years.  Fecal occult blood test (FOBT) of the stool. You may have this test every year starting at age 74.  Flexible sigmoidoscopy or colonoscopy. You may have a sigmoidoscopy every 5 years or a colonoscopy every 10 years starting at age 38.  Hepatitis C blood test.  Hepatitis B blood test.  Sexually transmitted disease (STD) testing.  Diabetes screening. This is done by checking your blood sugar (glucose) after you have not eaten for a while (fasting). You may have this done every 1-3 years.  Mammogram. This may be done every 1-2 years. Talk to your health care provider about when you should start having regular mammograms. This may depend on whether you have a family history of breast cancer.  BRCA-related cancer screening. This may  be done if you have a family history of breast, ovarian, tubal, or peritoneal cancers.  Pelvic exam and Pap test. This may be done every 3 years starting at age 44. Starting at age 72, this may be done every 5 years if you have a Pap test in  combination with an HPV test.  Bone density scan. This is done to screen for osteoporosis. You may have this scan if you are at high risk for osteoporosis. Discuss your test results, treatment options, and if necessary, the need for more tests with your health care provider. Vaccines  Your health care provider may recommend certain vaccines, such as:  Influenza vaccine. This is recommended every year.  Tetanus, diphtheria, and acellular pertussis (Tdap, Td) vaccine. You may need a Td booster every 10 years.  Zoster vaccine. You may need this after age 30.  Pneumococcal 13-valent conjugate (PCV13) vaccine. You may need this if you have certain conditions and were not previously vaccinated.  Pneumococcal polysaccharide (PPSV23) vaccine. You may need one or two doses if you smoke cigarettes or if you have certain conditions. Talk to your health care provider about which screenings and vaccines you need and how often you need them. This information is not intended to replace advice given to you by your health care provider. Make sure you discuss any questions you have with your health care provider. Document Released: 01/12/2016 Document Revised: 09/04/2016 Document Reviewed: 10/17/2015 Elsevier Interactive Patient Education  2017 [place] Prevention in the Home Falls can cause injuries. They can happen to people of all ages. There are many things you can do to make your home safe and to help prevent falls. What can I do on the outside of my home?  Regularly fix the edges of walkways and driveways and fix any cracks.  Remove anything that might make you trip as you walk through a door, such as a raised step or threshold.  Trim any bushes or trees on the path to your home.  Use bright outdoor lighting.  Clear any walking paths of anything that might make someone trip, such as rocks or tools.  Regularly check to see if handrails are loose or broken. Make sure that both  sides of any steps have handrails.  Any raised decks and porches should have guardrails on the edges.  Have any leaves, snow, or ice cleared regularly.  Use sand or salt on walking paths during winter.  Clean up any spills in your garage right away. This includes oil or grease spills. What can I do in the bathroom?  Use night lights.  Install grab bars by the toilet and in the tub and shower. Do not use towel bars as grab bars.  Use non-skid mats or decals in the tub or shower.  If you need to sit down in the shower, use a plastic, non-slip stool.  Keep the floor dry. Clean up any water that spills on the floor as soon as it happens.  Remove soap buildup in the tub or shower regularly.  Attach bath mats securely with double-sided non-slip rug tape.  Do not have throw rugs and other things on the floor that can make you trip. What can I do in the bedroom?  Use night lights.  Make sure that you have a light by your bed that is easy to reach.  Do not use any sheets or blankets that are too big for your bed. They should not hang down onto  the floor.  Have a firm chair that has side arms. You can use this for support while you get dressed.  Do not have throw rugs and other things on the floor that can make you trip. What can I do in the kitchen?  Clean up any spills right away.  Avoid walking on wet floors.  Keep items that you use a lot in easy-to-reach places.  If you need to reach something above you, use a strong step stool that has a grab bar.  Keep electrical cords out of the way.  Do not use floor polish or wax that makes floors slippery. If you must use wax, use non-skid floor wax.  Do not have throw rugs and other things on the floor that can make you trip. What can I do with my stairs?  Do not leave any items on the stairs.  Make sure that there are handrails on both sides of the stairs and use them. Fix handrails that are broken or loose. Make sure that  handrails are as long as the stairways.  Check any carpeting to make sure that it is firmly attached to the stairs. Fix any carpet that is loose or worn.  Avoid having throw rugs at the top or bottom of the stairs. If you do have throw rugs, attach them to the floor with carpet tape.  Make sure that you have a light switch at the top of the stairs and the bottom of the stairs. If you do not have them, ask someone to add them for you. What else can I do to help prevent falls?  Wear shoes that:  Do not have high heels.  Have rubber bottoms.  Are comfortable and fit you well.  Are closed at the toe. Do not wear sandals.  If you use a stepladder:  Make sure that it is fully opened. Do not climb a closed stepladder.  Make sure that both sides of the stepladder are locked into place.  Ask someone to hold it for you, if possible.  Clearly mark and make sure that you can see:  Any grab bars or handrails.  First and last steps.  Where the edge of each step is.  Use tools that help you move around (mobility aids) if they are needed. These include:  Canes.  Walkers.  Scooters.  Crutches.  Turn on the lights when you go into a dark area. Replace any light bulbs as soon as they burn out.  Set up your furniture so you have a clear path. Avoid moving your furniture around.  If any of your floors are uneven, fix them.  If there are any pets around you, be aware of where they are.  Review your medicines with your doctor. Some medicines can make you feel dizzy. This can increase your chance of falling. Ask your doctor what other things that you can do to help prevent falls. This information is not intended to replace advice given to you by your health care provider. Make sure you discuss any questions you have with your health care provider. Document Released: 10/12/2009 Document Revised: 05/23/2016 Document Reviewed: 01/20/2015 Elsevier Interactive Patient Education  2017  [employer].

## 2021-05-09 ENCOUNTER — Ambulatory Visit: Payer: Medicare Other | Admitting: Gastroenterology

## 2021-05-14 ENCOUNTER — Ambulatory Visit (INDEPENDENT_AMBULATORY_CARE_PROVIDER_SITE_OTHER): Payer: Medicare Other | Admitting: Gastroenterology

## 2021-05-14 ENCOUNTER — Other Ambulatory Visit: Payer: Self-pay

## 2021-05-14 ENCOUNTER — Encounter: Payer: Self-pay | Admitting: Gastroenterology

## 2021-05-14 VITALS — BP 127/82 | HR 80 | Temp 98.4°F | Ht 59.0 in | Wt 140.0 lb

## 2021-05-14 DIAGNOSIS — R195 Other fecal abnormalities: Secondary | ICD-10-CM

## 2021-05-14 DIAGNOSIS — R109 Unspecified abdominal pain: Secondary | ICD-10-CM | POA: Diagnosis not present

## 2021-05-14 MED ORDER — LOPERAMIDE HCL 2 MG PO TABS: 2.0000 mg | ORAL_TABLET | ORAL | 0 refills | Status: DC | PRN

## 2021-05-14 MED ORDER — FAMOTIDINE 20 MG PO TABS: 20.0000 mg | ORAL_TABLET | Freq: Every day | ORAL | 0 refills | Status: DC

## 2021-05-14 NOTE — Patient Instructions (Signed)
Food Choices for Gastroesophageal Reflux Disease, Adult When you have gastroesophageal reflux disease (GERD), the foods you eat and your eating habits are very important. Choosing the right foods can help ease your discomfort. Think about working with a food expert (dietitian) to help you make good choices. What are tips for following this plan? Reading food labels  Look for foods that are low in saturated fat. Foods that may help with your symptoms include: ? Foods that have less than 5% of daily value (DV) of fat. ? Foods that have 0 grams of trans fat. Cooking  Do not fry your food.  Cook your food by baking, steaming, grilling, or broiling. These are all methods that do not need a lot of fat for cooking.  To add flavor, try to use herbs that are low in spice and acidity. Meal planning  Choose healthy foods that are low in fat, such as: ? Fruits and vegetables. ? Whole grains. ? Low-fat dairy products. ? Lean meats, fish, and poultry.  Eat small meals often instead of eating 3 large meals each day. Eat your meals slowly in a place where you are relaxed. Avoid bending over or lying down until 2-3 hours after eating.  Limit high-fat foods such as fatty meats or fried foods.  Limit your intake of fatty foods, such as oils, butter, and shortening.  Avoid the following as told by your doctor: ? Foods that cause symptoms. These may be different for different people. Keep a food diary to keep track of foods that cause symptoms. ? Alcohol. ? Drinking a lot of liquid with meals. ? Eating meals during the 2-3 hours before bed.   Lifestyle  Stay at a healthy weight. Ask your doctor what weight is healthy for you. If you need to lose weight, work with your doctor to do so safely.  Exercise for at least 30 minutes on 5 or more days each week, or as told by your doctor.  Wear loose-fitting clothes.  Do not smoke or use any products that contain nicotine or tobacco. If you need help  quitting, ask your doctor.  Sleep with the head of your bed higher than your feet. Use a wedge under the mattress or blocks under the bed frame to raise the head of the bed.  Chew sugar-free gum after meals. What foods should eat? Eat a healthy, well-balanced diet of fruits, vegetables, whole grains, low-fat dairy products, lean meats, fish, and poultry. Each person is different. Foods that may cause symptoms in one person may not cause any symptoms in another person. Work with your doctor to find foods that are safe for you. The items listed above may not be a complete list of what you can eat and drink. Contact a food expert for more options.   What foods should I avoid? Limiting some of these foods may help in managing the symptoms of GERD. Everyone is different. Talk with a food expert or your doctor to help you find the exact foods to avoid, if any. Fruits Any fruits prepared with added fat. Any fruits that cause symptoms. For some people, this may include citrus fruits, such as oranges, grapefruit, pineapple, and lemons. Vegetables Deep-fried vegetables. French fries. Any vegetables prepared with added fat. Any vegetables that cause symptoms. For some people, this may include tomatoes and tomato products, chili peppers, onions and garlic, and horseradish. Grains Pastries or quick breads with added fat. Meats and other proteins High-fat meats, such as fatty beef or pork,   hot dogs, ribs, ham, sausage, salami, and bacon. Fried meat or protein, including fried fish and fried chicken. Nuts and nut butters, in large amounts. Dairy Whole milk and chocolate milk. Sour cream. Cream. Ice cream. Cream cheese. Milkshakes. Fats and oils Butter. Margarine. Shortening. Ghee. Beverages Coffee and tea, with or without caffeine. Carbonated beverages. Sodas. Energy drinks. Fruit juice made with acidic fruits, such as orange or grapefruit. Tomato juice. Alcoholic drinks. Sweets and desserts Chocolate and  cocoa. Donuts. Seasonings and condiments Pepper. Peppermint and spearmint. Added salt. Any condiments, herbs, or seasonings that cause symptoms. For some people, this may include curry, hot sauce, or vinegar-based salad dressings. The items listed above may not be a complete list of what you should not eat and drink. Contact a food expert for more options. Questions to ask your doctor Diet and lifestyle changes are often the first steps that are taken to manage symptoms of GERD. If diet and lifestyle changes do not help, talk with your doctor about taking medicines. Where to find more information  International Foundation for Gastrointestinal Disorders: aboutgerd.org Summary  When you have GERD, food and lifestyle choices are very important in easing your symptoms.  Eat small meals often instead of 3 large meals a day. Eat your meals slowly and in a place where you are relaxed.  Avoid bending over or lying down until 2-3 hours after eating.  Limit high-fat foods such as fatty meats or fried foods. This information is not intended to replace advice given to you by your health care provider. Make sure you discuss any questions you have with your health care provider. Document Revised: 06/26/2020 Document Reviewed: 06/26/2020 Elsevier Patient Education  2021 Elsevier Inc.  

## 2021-05-14 NOTE — Progress Notes (Signed)
Elizabeth Antigua, MD [address]  [place]  [place], [place] [address]  Main: [phone]  Fax: [phone]   Primary Care Physician: Delsa Grana, PA-C   Chief Complaint  Patient presents with  . Abdominal Pain    HPI: Elizabeth Mata is a 60 y.o. female here for follow-up of abdominal pain.  Patient states since discontinuing her PPI, her abdominal pain is continued and she has been taking Tums every day or every other day.  Also reports burning sensation in chest.  No dysphagia.  Is also reporting 3-4 loose bowel movements a day and having to rush to the bathroom when a bowel movement is occurring.  No blood in stool.  Biopsies were negative for microscopic colitis.  Has tried taking Metamucil but not had good results.  Current Outpatient Medications  Medication Sig Dispense Refill  . aspirin EC 81 MG EC tablet Take 1 tablet (81 mg total) by mouth daily. 30 tablet 1  . atorvastatin (LIPITOR) 80 MG tablet Take 1 tablet (80 mg total) by mouth daily. 90 tablet 3  . famotidine (PEPCID) 20 MG tablet Take 1 tablet (20 mg total) by mouth daily. 90 tablet 0  . fluticasone (FLONASE) 50 MCG/ACT nasal spray SPRAY 2 SPRAYS INTO EACH NOSTRIL EVERY DAY 48 mL 3  . gabapentin (NEURONTIN) 100 MG capsule Take 300 mg by mouth at bedtime.    Marland Kitchen loperamide (IMODIUM A-D) 2 MG tablet Take 1 tablet (2 mg total) by mouth as needed for diarrhea or loose stools. 30 tablet 0  . loratadine (CLARITIN) 10 MG tablet Take 10 mg by mouth daily.    Marland Kitchen omeprazole (PRILOSEC) 20 MG capsule Take 1 capsule (20 mg total) by mouth daily. 90 capsule 0  . topiramate (TOPAMAX) 200 MG tablet Take 200 mg by mouth 2 (two) times daily.   11  . UBRELVY 100 MG TABS Take 1 tablet by mouth as needed.     No current facility-administered medications for this visit.    Allergies as of 05/14/2021 - Review Complete 05/14/2021  Allergen Reaction Noted  . Clindamycin/lincomycin Hives 11/10/2015  . Hydrocodone Nausea  Only 03/01/2015  . Metronidazole Hives 11/07/2010  . Sulfa antibiotics Hives 07/05/2014  . Oxycodone Nausea And Vomiting 02/05/2017    ROS:  General: Negative for anorexia, weight loss, fever, chills, fatigue, weakness. ENT: Negative for hoarseness, difficulty swallowing , nasal congestion. CV: Negative for chest pain, angina, palpitations, dyspnea on exertion, peripheral edema.  Respiratory: Negative for dyspnea at rest, dyspnea on exertion, cough, sputum, wheezing.  GI: See history of present illness. GU:  Negative for dysuria, hematuria, urinary incontinence, urinary frequency, nocturnal urination.  Endo: Negative for unusual weight change.    Physical Examination:   BP 127/82   Pulse 80   Temp 98.4 F (36.9 C) (Oral)   Ht 4\' 11"  (1.499 m)   Wt 140 lb (63.5 kg)   BMI 28.28 kg/m   General: Well-nourished, well-developed in no acute distress.  Eyes: No icterus. Conjunctivae pink. Mouth: Oropharyngeal mucosa moist and pink , no lesions erythema or exudate. Neck: Supple, Trachea midline Abdomen: Bowel sounds are normal, nontender, nondistended, no hepatosplenomegaly or masses, no abdominal bruits or hernia , no rebound or guarding.   Extremities: No lower extremity edema. No clubbing or deformities. Neuro: Alert and oriented x 3.  Grossly intact. Skin: Warm and dry, no jaundice.   Psych: Alert and cooperative, normal mood and affect.   Labs: CMP  Component Value Date/Time   NA 144 08/05/2020 0533   NA 145 (H) 10/24/2015 1645   NA 144 12/31/2014 1450   K 3.3 (L) 08/05/2020 0533   K 3.6 12/31/2014 1450   CL 114 (H) 08/05/2020 0533   CL 113 (H) 12/31/2014 1450   CO2 22 08/05/2020 0533   CO2 21 12/31/2014 1450   GLUCOSE 99 08/05/2020 0533   GLUCOSE 107 (H) 12/31/2014 1450   BUN 15 08/05/2020 0533   BUN 11 10/24/2015 1645   BUN 17 12/31/2014 1450   CREATININE 0.79 08/05/2020 0533   CREATININE 0.92 05/18/2019 1137   CALCIUM 9.2 08/05/2020 0533   CALCIUM 9.4  12/31/2014 1450   PROT 6.9 08/05/2020 0533   PROT 6.5 07/21/2019 1535   PROT 7.8 05/03/2012 0903   ALBUMIN 4.2 08/05/2020 0533   ALBUMIN 4.4 07/21/2019 1535   ALBUMIN 3.8 05/03/2012 0903   AST 28 08/05/2020 0533   AST 30 05/03/2012 0903   ALT 33 08/05/2020 0533   ALT 25 05/03/2012 0903   ALKPHOS 66 08/05/2020 0533   ALKPHOS 88 05/03/2012 0903   BILITOT 0.8 08/05/2020 0533   BILITOT 0.4 07/21/2019 1535   BILITOT 0.6 05/03/2012 0903   GFRNONAA >60 08/05/2020 0533   GFRNONAA 70 05/18/2019 1137   GFRAA >60 08/05/2020 0533   GFRAA 81 05/18/2019 1137   Lab Results  Component Value Date   WBC 6.5 08/05/2020   HGB 13.3 08/05/2020   HCT 37.3 08/05/2020   MCV 91.9 08/05/2020   PLT 263 08/05/2020    Imaging Studies: MR ANGIO NECK W WO CONTRAST  Result Date: 04/30/2021 CLINICAL DATA:  Prior TIAs. EXAM: MRA NECK WITHOUT AND WITH CONTRAST TECHNIQUE: Multiplanar and multiecho pulse sequences of the neck were obtained without and with intravenous contrast. Angiographic images of the neck were obtained using MRA technique without and with intravenous contrast. CONTRAST:  18mL GADAVIST GADOBUTROL 1 MMOL/ML IV SOLN COMPARISON:  01/23/2018 and prior. FINDINGS: There is no high-grade narrowing or focal aneurysm involving the bilateral carotid arteries. No evidence of dissection. The bilateral vertebral arteries are patent and demonstrate antegrade flow. No evidence of dissection. Dominant left vertebral artery. No evidence of high-grade narrowing or focal aneurysm. Three vessel aortic arch. IMPRESSION: Normal MRA neck. Electronically Signed   By: Primitivo Gauze M.D.   On: 04/30/2021 12:25   MR BRAIN WO CONTRAST  Result Date: 04/30/2021 CLINICAL DATA:  Prior TIAs. EXAM: MRI HEAD WITHOUT CONTRAST TECHNIQUE: Multiplanar, multiecho pulse sequences of the brain and surrounding structures were obtained without intravenous contrast. COMPARISON:  08/05/2020 and prior. FINDINGS: Brain: No diffusion-weighted  signal abnormality. No intracranial hemorrhage. No midline shift, ventriculomegaly or extra-axial fluid collection. No mass lesion. Vascular: Normal flow voids. Skull and upper cervical spine: Normal marrow signal. Sinuses/Orbits: Normal orbits. Clear paranasal sinuses. No mastoid effusion. Other: None. IMPRESSION: Normal MRI brain. Electronically Signed   By: Primitivo Gauze M.D.   On: 04/30/2021 12:20    Assessment and Plan:   Elizabeth Mata is a 60 y.o. y/o female here for follow-up of abdominal pain and loose stools  See recently completed upper and lower endoscopy  Patient's pain has recurred after stopping PPI  She is willing to try H2 RA instead.  Start Pepcid once daily and if patient's symptoms are not better in the next 2 to 3 weeks I have advised her to let us know and we can restart her PPI  (Risks of PPI use were discussed with patient including bone  loss, C. Diff diarrhea, pneumonia, infections, CKD, electrolyte abnormalities.  Pt. Verbalizes understanding and chooses to continue the medication.)  Given ongoing loose stools despite Metamucil, we did discuss pelvic floor physical therapy as I do believe she has a component of pelvic floor muscle dysfunction.  She does not want a official pelvic floor physical therapy referral and states she will do these exercises at home first  Can start Imodium as needed once or twice a day as needed for the next 3 to 4 weeks and then stop or hold for hard stools or not having any bowel movements for 1 day.  Obtain fecal pancreatic elastase at this time as well   Dr Elizabeth Mata

## 2021-05-21 NOTE — Telephone Encounter (Signed)
Patient was seen on May 14, 2021.

## 2021-05-22 NOTE — Progress Notes (Signed)
Delsa Grana, PA-C   Chief Complaint  Patient presents with  . Vaginal Odor    Fishy, no discharge or itchiness x a while  . Urinary Tract Infection    Frequency, pelvic and back pain x a while    HPI:      [name] is a 60 y.o. G2P2 whose LMP was No LMP recorded. Patient has had a hysterectomy., presents today for fishy vaginal odor for a few wks without increased d/c or irritation. Hx of BV in past. Can't take flagyl due to hives but can do metrogel. No new soaps/detergents. Is occas sex active, no bleeding. S/p hyst.  Has also been having urinary frequency with and without good flow, strong odor, and LBP. No hematuria, dysuria, pelvic pain, fevers. Has hx of kidney stones. No PMB. No vag bleeding today. No recent abx use.   Past Medical History:  Diagnosis Date  . Allergic rhinitis 06/30/2016  . Allergy   . Carotid atherosclerosis, bilateral 11/06/2017   [place] Nov 2018  . Cyst of breast, left, solitary   . Flank pain   . GERD (gastroesophageal reflux disease)   . Hemorrhoid   . Hyperlipidemia   . IFG (impaired fasting glucose) 07/18/2016   June 2017   . Kidney stones   . Neuropathy 07/24/2016  . Seizures ([place])   . Shingles outbreak 02/03/2017  . Syncope    Recurrent over the last 2 years. had workup at [facility] this year with cardiology and neurology. Had echo and 3-week event monitor that were unremarkable. Has had negative EEG. Echo in 11/11 at [facility] showed no significant abnormalities. Possibly vasovagal  . Trigger thumb     Past Surgical History:  Procedure Laterality Date  . ABDOMINAL HYSTERECTOMY  40s   endometriosis, fibroid tumors; no cancer, partial  . BREAST EXCISIONAL BIOPSY Left 1980'S   EXCISIONAL - NEG  . BREAST SURGERY    . CHOLECYSTECTOMY  31  . COLONOSCOPY WITH PROPOFOL N/A 06/23/2019   Procedure: COLONOSCOPY WITH PROPOFOL;  Surgeon: Virgel Manifold, MD;  Location: [facility] ENDOSCOPY;  Service: Endoscopy;  Laterality: N/A;  . COLONOSCOPY WITH  PROPOFOL N/A 12/28/2020   Procedure: COLONOSCOPY WITH PROPOFOL;  Surgeon: Virgel Manifold, MD;  Location: [facility] ENDOSCOPY;  Service: Endoscopy;  Laterality: N/A;  . ESOPHAGOGASTRODUODENOSCOPY (EGD) WITH PROPOFOL N/A 06/23/2019   Procedure: ESOPHAGOGASTRODUODENOSCOPY (EGD) WITH PROPOFOL;  Surgeon: Virgel Manifold, MD;  Location: [facility] ENDOSCOPY;  Service: Endoscopy;  Laterality: N/A;  . ESOPHAGOGASTRODUODENOSCOPY (EGD) WITH PROPOFOL N/A 12/28/2020   Procedure: ESOPHAGOGASTRODUODENOSCOPY (EGD) WITH PROPOFOL;  Surgeon: Virgel Manifold, MD;  Location: [facility] ENDOSCOPY;  Service: Endoscopy;  Laterality: N/A;  . FOOT SURGERY Right    tendon release    Family History  Problem Relation Age of Onset  . Coronary artery disease Maternal Uncle   . Diabetes Mellitus II Mother   . Arthritis Mother   . Diabetes Maternal Uncle   . Kidney cancer Father 34       died 04/20/2017  . Kidney disease Father   . Cancer Father        kidney and bladder  . Arthritis Maternal Uncle   . Cancer Maternal Grandmother        stomach?  . Jaundice Maternal Grandmother   . Stomach cancer Paternal Grandmother   . Cancer Paternal Grandmother        stomach  . Hypertension Sister   . Stroke Maternal Grandfather   . Diabetes Paternal  Grandfather   . Diabetes Sister   . Hypertension Sister   . Breast cancer Neg Hx   . Bladder Cancer Neg Hx     Social History   Socioeconomic History  . Marital status: Legally Separated    Spouse name: Not on file  . Number of children: 2  . Years of education: Not on file  . Highest education level: GED or equivalent  Occupational History  . Occupation: unemployed  Tobacco Use  . Smoking status: Never Smoker  . Smokeless tobacco: Never Used  Vaping Use  . Vaping Use: Never used  Substance and Sexual Activity  . Alcohol use: Not Currently    Alcohol/week: 0.0 standard drinks  . Drug use: Never  . Sexual activity: Yes    Partners: Male    Birth  control/protection: Surgical    Comment: Hysterectomy  Other Topics Concern  . Not on file  Social History Narrative   Regular exercise   Social Determinants of Health   Financial Resource Strain: Low Risk   . Difficulty of Paying Living Expenses: Not very hard  Food Insecurity: No Food Insecurity  . Worried About Charity fundraiser in the Last Year: Never true  . Ran Out of Food in the Last Year: Never true  Transportation Needs: No Transportation Needs  . Lack of Transportation (Medical): No  . Lack of Transportation (Non-Medical): No  Physical Activity: Sufficiently Active  . Days of Exercise per Week: 7 days  . Minutes of Exercise per Session: 30 min  Stress: No Stress Concern Present  . Feeling of Stress : Not at all  Social Connections: Moderately Isolated  . Frequency of Communication with Friends and Family: More than three times a week  . Frequency of Social Gatherings with Friends and Family: Three times a week  . Attends Religious Services: More than 4 times per year  . Active Member of Clubs or Organizations: No  . Attends Archivist Meetings: Never  . Marital Status: Separated  Intimate Partner Violence: Not At Risk  . Fear of Current or Ex-Partner: No  . Emotionally Abused: No  . Physically Abused: No  . Sexually Abused: No    Outpatient Medications Prior to Visit  Medication Sig Dispense Refill  . aspirin EC 81 MG EC tablet Take 1 tablet (81 mg total) by mouth daily. 30 tablet 1  . atorvastatin (LIPITOR) 80 MG tablet Take 1 tablet (80 mg total) by mouth daily. 90 tablet 3  . famotidine (PEPCID) 20 MG tablet Take 1 tablet (20 mg total) by mouth daily. 90 tablet 0  . fluticasone (FLONASE) 50 MCG/ACT nasal spray SPRAY 2 SPRAYS INTO EACH NOSTRIL EVERY DAY 48 mL 3  . gabapentin (NEURONTIN) 100 MG capsule Take 300 mg by mouth at bedtime.    Marland Kitchen loperamide (IMODIUM A-D) 2 MG tablet Take 1 tablet (2 mg total) by mouth as needed for diarrhea or loose stools.  30 tablet 0  . loratadine (CLARITIN) 10 MG tablet Take 10 mg by mouth daily.    Marland Kitchen omeprazole (PRILOSEC) 20 MG capsule Take 1 capsule (20 mg total) by mouth daily. 90 capsule 0  . topiramate (TOPAMAX) 200 MG tablet Take 200 mg by mouth 2 (two) times daily.   11  . UBRELVY 100 MG TABS Take 1 tablet by mouth as needed.     No facility-administered medications prior to visit.      ROS:  Review of Systems  Constitutional: Negative for fever.  Gastrointestinal: Negative for blood in stool, constipation, diarrhea, nausea and vomiting.  Genitourinary: Positive for frequency. Negative for dyspareunia, dysuria, flank pain, hematuria, urgency, vaginal bleeding, vaginal discharge and vaginal pain.  Musculoskeletal: Positive for back pain.  Skin: Negative for rash.   OBJECTIVE:   Vitals:  BP 120/80   Ht 4\' 11"  (1.499 m)   Wt 140 lb (63.5 kg)   BMI 28.28 kg/m   Physical Exam Vitals reviewed.  Constitutional:      Appearance: She is well-developed. She is not ill-appearing or toxic-appearing.  Pulmonary:     Effort: Pulmonary effort is normal.  Abdominal:     Tenderness: There is no right CVA tenderness or left CVA tenderness.  Genitourinary:    General: Normal vulva.     Pubic Area: No rash.      Labia:        Right: No rash, tenderness or lesion.        Left: No rash, tenderness or lesion.      Vagina: Vaginal discharge present. No erythema, tenderness or bleeding.     Cervix: Normal.     Uterus: Normal. Not enlarged and not tender.      Adnexa: Right adnexa normal and left adnexa normal.       Right: No mass or tenderness.         Left: No mass or tenderness.    Musculoskeletal:        General: Normal range of motion.     Cervical back: Normal range of motion.  Skin:    General: Skin is warm and dry.  Neurological:     General: No focal deficit present.     Mental Status: She is alert and oriented to person, place, and time.     Cranial Nerves: No cranial nerve deficit.   Psychiatric:        Mood and Affect: Mood normal.        Behavior: Behavior normal.        Thought Content: Thought content normal.        Judgment: Judgment normal.     Results: Results for orders placed or performed in visit on 05/23/21 (from the past 24 hour(s))  POCT Wet Prep with KOH     Status: Abnormal   Collection Time: 05/23/21  1:43 PM  Result Value Ref Range   Trichomonas, UA Negative    Clue Cells Wet Prep HPF POC pos    Epithelial Wet Prep HPF POC     Yeast Wet Prep HPF POC neg    Bacteria Wet Prep HPF POC     RBC Wet Prep HPF POC     WBC Wet Prep HPF POC     KOH Prep POC Positive (A) Negative  POCT Urinalysis Dipstick     Status: Abnormal   Collection Time: 05/23/21  1:44 PM  Result Value Ref Range   Color, UA yellow    Clarity, UA clear    Glucose, UA Negative Negative   Bilirubin, UA neg    Ketones, UA neg    Spec Grav, UA 1.010 1.010 - 1.025   Blood, UA mod    pH, UA 6.0 5.0 - 8.0   Protein, UA Negative Negative   Urobilinogen, UA     Nitrite, UA neg    Leukocytes, UA Negative Negative   Appearance     Odor     NO VAG BLEEDING ON EXAM  Assessment/Plan: UTI symptoms - Plan: cephALEXin (KEFLEX) 500 MG capsule,  POCT Urinalysis Dipstick, Urine Culture; pos sx and UA with hematuria. Treat for UTI, Rx keflex. Check C&S. If neg, will recheck urine for hematuria.   Asymptomatic microscopic hematuria - Plan: Urine Culture; hx of kidney stones  BV (bacterial vaginosis) - Plan: metroNIDAZOLE (METROGEL) 0.75 % vaginal gel, POCT Wet Prep with KOH; pos sx and wet prep. Rx metrogel. F/u prn. Will RF if sx recur.    Meds ordered this encounter  Medications  . cephALEXin (KEFLEX) 500 MG capsule    Sig: Take 1 capsule (500 mg total) by mouth 2 (two) times daily for 7 days.    Dispense:  14 capsule    Refill:  0    Order Specific Question:   Supervising Provider    Answer:   Gae Dry [id number]  . metroNIDAZOLE (METROGEL) 0.75 % vaginal gel    Sig:  Place 1 Applicatorful vaginally at bedtime for 5 days.    Dispense:  50 g    Refill:  0    Order Specific Question:   Supervising Provider    Answer:   Gae Dry [[id number]      Return if symptoms worsen or fail to improve.  Elijio Staples B. Kyland No, PA-C 05/23/2021 1:46 PM

## 2021-05-23 ENCOUNTER — Ambulatory Visit (INDEPENDENT_AMBULATORY_CARE_PROVIDER_SITE_OTHER): Payer: Medicare Other | Admitting: Obstetrics and Gynecology

## 2021-05-23 ENCOUNTER — Other Ambulatory Visit: Payer: Self-pay

## 2021-05-23 ENCOUNTER — Encounter: Payer: Self-pay | Admitting: Obstetrics and Gynecology

## 2021-05-23 VITALS — BP 120/80 | Ht 59.0 in | Wt 140.0 lb

## 2021-05-23 DIAGNOSIS — B9689 Other specified bacterial agents as the cause of diseases classified elsewhere: Secondary | ICD-10-CM | POA: Diagnosis not present

## 2021-05-23 DIAGNOSIS — I6381 Other cerebral infarction due to occlusion or stenosis of small artery: Secondary | ICD-10-CM

## 2021-05-23 DIAGNOSIS — R3121 Asymptomatic microscopic hematuria: Secondary | ICD-10-CM | POA: Diagnosis not present

## 2021-05-23 DIAGNOSIS — N760 Acute vaginitis: Secondary | ICD-10-CM | POA: Diagnosis not present

## 2021-05-23 DIAGNOSIS — R399 Unspecified symptoms and signs involving the genitourinary system: Secondary | ICD-10-CM | POA: Diagnosis not present

## 2021-05-23 LAB — POCT URINALYSIS DIPSTICK
Bilirubin, UA: NEGATIVE
Glucose, UA: NEGATIVE
Ketones, UA: NEGATIVE
Leukocytes, UA: NEGATIVE
Nitrite, UA: NEGATIVE
Protein, UA: NEGATIVE
Spec Grav, UA: 1.01 (ref 1.010–1.025)
pH, UA: 6 (ref 5.0–8.0)

## 2021-05-23 LAB — POCT WET PREP WITH KOH
Clue Cells Wet Prep HPF POC: POSITIVE
KOH Prep POC: POSITIVE — AB
Trichomonas, UA: NEGATIVE
Yeast Wet Prep HPF POC: NEGATIVE

## 2021-05-23 MED ORDER — METRONIDAZOLE 0.75 % VA GEL: 1.0000 | Freq: Every day | VAGINAL | 0 refills | Status: AC

## 2021-05-23 MED ORDER — CEPHALEXIN 500 MG PO CAPS: 500.0000 mg | ORAL_CAPSULE | Freq: Two times a day (BID) | ORAL | 0 refills | Status: AC

## 2021-05-23 NOTE — Patient Instructions (Signed)
I value your feedback and you entrusting us with your care. If you get a [facility] patient survey, I would appreciate you taking the time to let us know about your experience today. Thank you! ? ? ?

## 2021-05-25 LAB — URINE CULTURE: Organism ID, Bacteria: NO GROWTH

## 2021-05-25 NOTE — Progress Notes (Signed)
Pt aware, transferred to [facility] to schedule appt.

## 2021-05-25 NOTE — Progress Notes (Signed)
Pls let pt know the urine culture is negative for a UTI and she can stop the abx. She needs a urine recheck next week given the blood in her urine this wk. Can do in office and LC UA with CMA. Thx.

## 2021-05-29 ENCOUNTER — Ambulatory Visit (INDEPENDENT_AMBULATORY_CARE_PROVIDER_SITE_OTHER): Payer: Medicare Other

## 2021-05-29 ENCOUNTER — Other Ambulatory Visit: Payer: Self-pay

## 2021-05-29 DIAGNOSIS — R3129 Other microscopic hematuria: Secondary | ICD-10-CM

## 2021-05-29 NOTE — Patient Instructions (Signed)
I value your feedback and you entrusting us with your care. If you get a [facility] patient survey, I would appreciate you taking the time to let us know about your experience today. Thank you! ? ? ?

## 2021-05-29 NOTE — Progress Notes (Signed)
Pt came to give another ccu d/t hematuria.  Please see u/a results.  Pt was called to come back and give another CCU as I drew it up for a culture and ABC needed a [facility] U/A.  I apologized to the pt.   Sample and order given to [facility] c [place].

## 2021-05-29 NOTE — Progress Notes (Signed)
Order(s) created erroneously. Erroneous order ID: [id number]  Order moved by: Genia Harold D  Order move date/time: 05/29/2021 4:48 PM  Source Patient: [id number]  Source Contact: 05/29/2021  Destination Patient: [id number]  Destination Contact: 08/17/2020

## 2021-05-29 NOTE — Addendum Note (Signed)
Addended by: Cleophas Dunker D on: 05/29/2021 11:51 AM   Modules accepted: Orders

## 2021-05-30 LAB — URINALYSIS, ROUTINE W REFLEX MICROSCOPIC
Bilirubin, UA: NEGATIVE
Glucose, UA: NEGATIVE
Ketones, UA: NEGATIVE
Leukocytes,UA: NEGATIVE
Nitrite, UA: NEGATIVE
Protein,UA: NEGATIVE
Specific Gravity, UA: 1.009 (ref 1.005–1.030)
Urobilinogen, Ur: 0.2 mg/dL (ref 0.2–1.0)
pH, UA: 7 (ref 5.0–7.5)

## 2021-05-30 LAB — MICROSCOPIC EXAMINATION
Bacteria, UA: NONE SEEN
Casts: NONE SEEN /lpf
Epithelial Cells (non renal): NONE SEEN /hpf (ref 0–10)
RBC, Urine: NONE SEEN /hpf (ref 0–2)
WBC, UA: NONE SEEN /hpf (ref 0–5)

## 2021-05-30 NOTE — Progress Notes (Signed)
Pls let the pt know the lab UA doesn't show any blood in her urine (it only shows up on the dipstick, which is a false positive), so everything is good. Is she still having urinary sx? If not, then nothing further to do at this time.  Thx

## 2021-06-07 ENCOUNTER — Ambulatory Visit: Payer: Medicare Other | Admitting: Family Medicine

## 2021-06-11 ENCOUNTER — Other Ambulatory Visit: Payer: Self-pay | Admitting: Family Medicine

## 2021-06-11 ENCOUNTER — Other Ambulatory Visit: Payer: Self-pay | Admitting: Gastroenterology

## 2021-06-11 DIAGNOSIS — R109 Unspecified abdominal pain: Secondary | ICD-10-CM

## 2021-06-11 DIAGNOSIS — I6523 Occlusion and stenosis of bilateral carotid arteries: Secondary | ICD-10-CM

## 2021-06-11 DIAGNOSIS — R350 Frequency of micturition: Secondary | ICD-10-CM

## 2021-06-11 DIAGNOSIS — R319 Hematuria, unspecified: Secondary | ICD-10-CM

## 2021-06-21 ENCOUNTER — Ambulatory Visit: Payer: Medicare Other | Admitting: Unknown Physician Specialty

## 2021-06-26 ENCOUNTER — Ambulatory Visit: Payer: Self-pay

## 2021-06-26 NOTE — Telephone Encounter (Signed)
Please get in sooner with rumball or wicker

## 2021-06-26 NOTE — Telephone Encounter (Signed)
Pt. Reports she started feeling tired and weak last week. No other symptoms. Eating and drinking well. Has an appointment in August. Would like to be seen sooner. Please advise.

## 2021-06-26 NOTE — Telephone Encounter (Signed)
Reason for CRM: Pt called reporting that she feels sluggish and tired. She does not feel herself, feels "yucky" also reported slight dizziness. No passing out.   Best contact: [phone] Answer Assessment - Initial Assessment Questions 1. DESCRIPTION: "Describe how you are feeling."     Tired 2. SEVERITY: "How bad is it?"  "Can you stand and walk?"   - MILD - Feels weak or tired, but does not interfere with work, school or normal activities   - [place] to stand and walk; weakness interferes with work, school, or normal activities   - SEVERE - Unable to stand or walk     Mild 3. ONSET:  "When did the weakness begin?"     Last week 4. CAUSE: "What do you think is causing the weakness?"     Unsure 5. MEDICINES: "Have you recently started a new medicine or had a change in the amount of a medicine?"     No 6. OTHER SYMPTOMS: "Do you have any other symptoms?" (e.g., chest pain, fever, cough, SOB, vomiting, diarrhea, bleeding, other areas of pain)     No 7. PREGNANCY: "Is there any chance you are pregnant?" "When was your last menstrual period?"     No  Protocols used: Weakness (Generalized) and Fatigue-A-AH

## 2021-06-27 NOTE — Telephone Encounter (Signed)
Called pt to get her in earlier and pt stated she did not need an appt here. She made an appt with [facility] for today

## 2021-08-06 ENCOUNTER — Ambulatory Visit
Admission: RE | Admit: 2021-08-06 | Discharge: 2021-08-06 | Disposition: A | Discharge status: Home / Self Care | Payer: Medicare Other | Attending: Family Medicine | Admitting: Family Medicine

## 2021-08-06 ENCOUNTER — Ambulatory Visit (INDEPENDENT_AMBULATORY_CARE_PROVIDER_SITE_OTHER): Payer: Medicare Other | Admitting: Family Medicine

## 2021-08-06 ENCOUNTER — Ambulatory Visit
Admission: RE | Admit: 2021-08-06 | Discharge: 2021-08-06 | Disposition: A | Discharge status: Home / Self Care | Payer: Medicare Other | Source: Ambulatory Visit | Attending: Family Medicine | Admitting: Family Medicine

## 2021-08-06 ENCOUNTER — Encounter: Payer: Self-pay | Admitting: Family Medicine

## 2021-08-06 ENCOUNTER — Other Ambulatory Visit: Payer: Self-pay

## 2021-08-06 VITALS — BP 122/68 | HR 86 | Temp 98.7°F | Resp 16 | Ht 59.0 in | Wt 139.7 lb

## 2021-08-06 DIAGNOSIS — G629 Polyneuropathy, unspecified: Secondary | ICD-10-CM

## 2021-08-06 DIAGNOSIS — Z5181 Encounter for therapeutic drug level monitoring: Secondary | ICD-10-CM

## 2021-08-06 DIAGNOSIS — M79675 Pain in left toe(s): Secondary | ICD-10-CM

## 2021-08-06 DIAGNOSIS — M7989 Other specified soft tissue disorders: Secondary | ICD-10-CM | POA: Insufficient documentation

## 2021-08-06 DIAGNOSIS — I6523 Occlusion and stenosis of bilateral carotid arteries: Secondary | ICD-10-CM

## 2021-08-06 DIAGNOSIS — G43109 Migraine with aura, not intractable, without status migrainosus: Secondary | ICD-10-CM

## 2021-08-06 DIAGNOSIS — R569 Unspecified convulsions: Secondary | ICD-10-CM

## 2021-08-06 DIAGNOSIS — R7301 Impaired fasting glucose: Secondary | ICD-10-CM

## 2021-08-06 DIAGNOSIS — K581 Irritable bowel syndrome with constipation: Secondary | ICD-10-CM

## 2021-08-06 DIAGNOSIS — E782 Mixed hyperlipidemia: Secondary | ICD-10-CM | POA: Diagnosis not present

## 2021-08-06 DIAGNOSIS — J343 Hypertrophy of nasal turbinates: Secondary | ICD-10-CM

## 2021-08-06 DIAGNOSIS — E876 Hypokalemia: Secondary | ICD-10-CM

## 2021-08-06 MED ORDER — ATORVASTATIN CALCIUM 80 MG PO TABS: 80.0000 mg | ORAL_TABLET | Freq: Every day | ORAL | 3 refills | Status: DC

## 2021-08-06 MED ORDER — TRIAMCINOLONE ACETONIDE 55 MCG/ACT NA AERO: 2.0000 | INHALATION_SPRAY | Freq: Every day | NASAL | 12 refills | Status: DC

## 2021-08-06 NOTE — Progress Notes (Signed)
Name: [name]   MRN: [id number]    DOB: 08/29/1961   Date:08/06/2021       Progress Note  Chief Complaint  Patient presents with   Hyperlipidemia     Subjective:   Elizabeth Mata is a 60 y.o. female, presents to clinic for routine f/up  IBS - on PPI/pepcid/immodium - sx fairly well controlled, no change or worsening  Allergies on flonase - well controlled  Hyperlipidemia: Currently treated with lipitor 80 mg, pt reports good med compliance Last Lipids: Lab Results  Component Value Date   CHOL 188 03/12/2019   HDL 73 03/12/2019   LDLCALC 101 (H) 03/12/2019   TRIG 62 03/12/2019   CHOLHDL 2.6 03/12/2019   - Denies: Chest pain, shortness of breath, myalgias, claudication   Migraines/hx of seizures TIA on ubrelvy topamax and neurontin  Stenosis of carotid artery, unspecified laterality   Occlusion and stenosis of unspecified carotid artery   Occlusion and stenosis of left carotid artery Managed by [facility]  - recent MRI neck angiogram w/wo contrast - reviewed through care everywhere      Current Outpatient Medications:    aspirin EC 81 MG EC tablet, Take 1 tablet (81 mg total) by mouth daily., Disp: 30 tablet, Rfl: 1   atorvastatin (LIPITOR) 80 MG tablet, Take 1 tablet (80 mg total) by mouth daily., Disp: 90 tablet, Rfl: 3   famotidine (PEPCID) 20 MG tablet, Take 1 tablet (20 mg total) by mouth daily., Disp: 90 tablet, Rfl: 0   fluticasone (FLONASE) 50 MCG/ACT nasal spray, SPRAY 2 SPRAYS INTO EACH NOSTRIL EVERY DAY, Disp: 48 mL, Rfl: 3   gabapentin (NEURONTIN) 100 MG capsule, Take 300 mg by mouth at bedtime., Disp: , Rfl:    loperamide (IMODIUM A-D) 2 MG tablet, Take 1 tablet (2 mg total) by mouth as needed for diarrhea or loose stools., Disp: 30 tablet, Rfl: 0   loratadine (CLARITIN) 10 MG tablet, Take 10 mg by mouth daily., Disp: , Rfl:    omeprazole (PRILOSEC) 20 MG capsule, Take 1 capsule (20 mg total) by mouth daily., Disp: 90 capsule, Rfl: 0    topiramate (TOPAMAX) 200 MG tablet, Take 200 mg by mouth 2 (two) times daily. , Disp: , Rfl: 11   UBRELVY 100 MG TABS, Take 1 tablet by mouth as needed., Disp: , Rfl:   Patient Active Problem List   Diagnosis Date Noted   BV (bacterial vaginosis) 05/23/2021   Pelvic floor weakness in female 03/03/2020   LLQ pain 03/03/2020   History of colonic polyps    Polyp of ascending colon    Hemorrhoids    Stomach irritation    Abdominal pain, epigastric    Dizziness 10/27/2018   Palpitations 10/27/2018   Urinary urgency 04/21/2018   Menopausal symptom 04/21/2018   Urticaria 01/05/2018   Carotid atherosclerosis, bilateral 11/06/2017   TIA (transient ischemic attack) 10/28/2017   Hx of completed stroke 10/28/2017   Ataxia 10/08/2017   Breast pain in female 07/11/2017   Medication monitoring encounter 12/20/2016   Abnormality of globulin 07/26/2016   Neuropathy 07/24/2016   Skin pigmentation disorder 07/24/2016   IFG (impaired fasting glucose) 07/18/2016   Hematuria 07/03/2016   Allergic rhinitis 06/30/2016   Chronic left hip pain 06/28/2016   Breast cancer screening 04/17/2016   Skin texture changes 12/27/2015   Vaginal discharge 10/31/2015   Screening for STD (sexually transmitted disease) 10/31/2015   Kidney stone on left side 10/24/2015   Annual physical exam  09/13/2015   Encounter for screening mammogram for malignant neoplasm of breast 09/13/2015   Need for immunization against influenza 09/13/2015   Eczema 09/13/2015   Irritable bowel syndrome with constipation 09/13/2015   Seizures ([place]) 08/16/2015   Carpal tunnel syndrome 07/26/2014   Carotid artery narrowing 07/05/2013   Basilar artery migraine 07/06/2012    Past Surgical History:  Procedure Laterality Date   ABDOMINAL HYSTERECTOMY  40s   endometriosis, fibroid tumors; no cancer, partial   BREAST EXCISIONAL BIOPSY Left 1980'S   EXCISIONAL - NEG   BREAST SURGERY     CHOLECYSTECTOMY  31   COLONOSCOPY WITH PROPOFOL  N/A 06/23/2019   Procedure: COLONOSCOPY WITH PROPOFOL;  Surgeon: Virgel Manifold, MD;  Location: [facility];  Service: Endoscopy;  Laterality: N/A;   COLONOSCOPY WITH PROPOFOL N/A 12/28/2020   Procedure: COLONOSCOPY WITH PROPOFOL;  Surgeon: Virgel Manifold, MD;  Location: [facility];  Service: Endoscopy;  Laterality: N/A;   ESOPHAGOGASTRODUODENOSCOPY (EGD) WITH PROPOFOL N/A 06/23/2019   Procedure: ESOPHAGOGASTRODUODENOSCOPY (EGD) WITH PROPOFOL;  Surgeon: Virgel Manifold, MD;  Location: [facility];  Service: Endoscopy;  Laterality: N/A;   ESOPHAGOGASTRODUODENOSCOPY (EGD) WITH PROPOFOL N/A 12/28/2020   Procedure: ESOPHAGOGASTRODUODENOSCOPY (EGD) WITH PROPOFOL;  Surgeon: Virgel Manifold, MD;  Location: [facility];  Service: Endoscopy;  Laterality: N/A;   FOOT SURGERY Right    tendon release    Family History  Problem Relation Age of Onset   Coronary artery disease Maternal Uncle    Diabetes Mellitus II Mother    Arthritis Mother    Diabetes Maternal Uncle    Kidney cancer Father 64       died May 17, 2017   Kidney disease Father    Cancer Father        kidney and bladder   Arthritis Maternal Uncle    Cancer Maternal Grandmother        stomach?   Jaundice Maternal Grandmother    Stomach cancer Paternal Grandmother    Cancer Paternal Grandmother        stomach   Hypertension Sister    Stroke Maternal Grandfather    Diabetes Paternal Grandfather    Diabetes Sister    Hypertension Sister    Breast cancer Neg Hx    Bladder Cancer Neg Hx     Social History   Tobacco Use   Smoking status: Never   Smokeless tobacco: Never  Vaping Use   Vaping Use: Never used  Substance Use Topics   Alcohol use: Not Currently    Alcohol/week: 0.0 standard drinks   Drug use: Never     Allergies  Allergen Reactions   Clindamycin/Lincomycin Hives   Hydrocodone Nausea Only   Metronidazole Hives   Sulfa Antibiotics Hives    Break out in hives all over    Oxycodone Nausea And Vomiting    Health Maintenance  Topic Date Due   Zoster Vaccines- Shingrix (1 of 2) Never done   COVID-19 Vaccine (3 - Booster for Pfizer series) 09/19/2020   INFLUENZA VACCINE  07/30/2021   MAMMOGRAM  09/25/2022   COLONOSCOPY (Pts 45-77yr Insurance coverage will need to be confirmed)  12/28/2025   TETANUS/TDAP  10/28/2027   Hepatitis C Screening  Completed   HIV Screening  Completed   Pneumococcal Vaccine [id number] old  Aged Out   HPV VACCINES  Aged Out   PAP SMEAR-Modifier  Discontinued    Chart Review Today: I personally reviewed active problem list, medication list, allergies, family history, social history,  health maintenance, notes from last encounter, lab results, imaging with the patient/caregiver today.   Review of Systems  Constitutional: Negative.   HENT: Negative.    Eyes: Negative.   Respiratory: Negative.    Cardiovascular: Negative.   Gastrointestinal: Negative.   Endocrine: Negative.   Genitourinary: Negative.   Musculoskeletal: Negative.   Skin: Negative.   Allergic/Immunologic: Negative.   Neurological: Negative.   Hematological: Negative.   Psychiatric/Behavioral: Negative.    All other systems reviewed and are negative.   Objective:   Vitals:   08/06/21 1420  BP: 122/68  Pulse: 86  Resp: 16  Temp: 98.7 F (37.1 C)  Weight: 139 lb 11.2 oz (63.4 kg)  Height: '4\' 11"'$  (1.499 m)    Body mass index is 28.22 kg/m.  Physical Exam Vitals and nursing note reviewed.  Constitutional:      General: She is not in acute distress.    Appearance: Normal appearance. She is well-developed. She is not ill-appearing, toxic-appearing or diaphoretic.     Interventions: Face mask in place.  HENT:     Head: Normocephalic and atraumatic.     Right Ear: Tympanic membrane, ear canal and external ear normal.     Left Ear: Tympanic membrane, ear canal and external ear normal.     Nose: No mucosal edema, congestion or rhinorrhea.     Right  Turbinates: Enlarged and swollen.     Left Turbinates: Enlarged and swollen.     Right Sinus: No maxillary sinus tenderness or frontal sinus tenderness.     Left Sinus: No maxillary sinus tenderness or frontal sinus tenderness.     Mouth/Throat:     Lips: Pink.     Pharynx: Oropharynx is clear. Uvula midline. No pharyngeal swelling, oropharyngeal exudate, posterior oropharyngeal erythema or uvula swelling.  Eyes:     General: Lids are normal. No scleral icterus.       Right eye: No discharge.        Left eye: No discharge.     Conjunctiva/sclera: Conjunctivae normal.  Neck:     Trachea: Phonation normal. No tracheal deviation.  Cardiovascular:     Rate and Rhythm: Normal rate and regular rhythm.     Pulses: Normal pulses.          Radial pulses are 2+ on the right side and 2+ on the left side.       Posterior tibial pulses are 2+ on the right side and 2+ on the left side.     Heart sounds: Normal heart sounds. No murmur heard.   No friction rub. No gallop.  Pulmonary:     Effort: Pulmonary effort is normal. No respiratory distress.     Breath sounds: Normal breath sounds. No stridor. No wheezing, rhonchi or rales.  Chest:     Chest wall: No tenderness.  Abdominal:     General: Bowel sounds are normal. There is no distension.     Palpations: Abdomen is soft.  Musculoskeletal:     Right lower leg: No edema.     Left lower leg: No edema.     Left foot: Decreased range of motion. Normal capillary refill. Swelling, tenderness and bony tenderness present. Normal pulse.     Comments: Swelling and bruising to left distal dorsal lateral foot and toes 3-5, 5th digit swollen, tender and with limited ROM, normal cap refill  Feet:     Left foot:     Toenail Condition: Left toenails are normal.  Skin:  General: Skin is warm and dry.     Coloration: Skin is not jaundiced or pale.     Findings: No rash.  Neurological:     Mental Status: She is alert.     Motor: No abnormal muscle tone.      Gait: Gait normal.  Psychiatric:        Mood and Affect: Mood normal.        Speech: Speech normal.        Behavior: Behavior normal.        Assessment & Plan:     ICD-10-CM   1. Mixed hyperlipidemia  E78.2 atorvastatin (LIPITOR) 80 MG tablet    Lipid panel    COMPLETE METABOLIC PANEL WITH GFR   hx of carotic atherosclerosis and stroke, on statin, tolerating, due for labs    2. Carotid atherosclerosis, bilateral  I65.23 atorvastatin (LIPITOR) 80 MG tablet    Lipid panel    COMPLETE METABOLIC PANEL WITH GFR    3. Basilar artery migraine  [id number] COMPLETE METABOLIC PANEL WITH GFR   managed per specialists     4. Irritable bowel syndrome with constipation  K58.1    managing with diet/lifestyle and OTC meds    5. Hypokalemia  [id number] COMPLETE METABOLIC PANEL WITH GFR   recheck    6. Neuropathy  G62.9 CBC with Differential/Platelet    COMPLETE METABOLIC PANEL WITH GFR   managed by neurology    7. IFG (impaired fasting glucose)  R73.01 Hemoglobin A1c    8. Pain and swelling of toe of left foot  M79.675 DG Foot Complete Left   M79.89    dorsal foot pain, swelling and bruising injury 4 days ago, impact between 5th and 4th toes, ttp, limping    9. Medication monitoring encounter  Z51.81 CBC with Differential/Platelet    Lipid panel    COMPLETE METABOLIC PANEL WITH GFR    Hemoglobin A1c    10. Seizures ([place]) Chronic R56.9    none recently, managed by neurology    11. Nasal turbinate hypertrophy  J34.3    bilaterally, feels obstructed on the left often - concerned for a polyp - tx with antihistamine and intranasal steroids, can refer to ENT if pt would like f/up       Return in about 6 months (around 02/06/2022).   Delsa Grana, PA-C 08/06/21 1:58 PM

## 2021-08-07 LAB — COMPLETE METABOLIC PANEL WITH GFR
AG Ratio: 1.8 (calc) (ref 1.0–2.5)
ALT: 22 U/L (ref 6–29)
AST: 27 U/L (ref 10–35)
Albumin: 4.2 g/dL (ref 3.6–5.1)
Alkaline phosphatase (APISO): 78 U/L (ref 37–153)
BUN: 14 mg/dL (ref 7–25)
CO2: 23 mmol/L (ref 20–32)
Calcium: 10 mg/dL (ref 8.6–10.4)
Chloride: 112 mmol/L — ABNORMAL HIGH (ref 98–110)
Creat: 0.96 mg/dL (ref 0.50–1.03)
Globulin: 2.4 g/dL (calc) (ref 1.9–3.7)
Glucose, Bld: 96 mg/dL (ref 65–99)
Potassium: 4 mmol/L (ref 3.5–5.3)
Sodium: 144 mmol/L (ref 135–146)
Total Bilirubin: 0.4 mg/dL (ref 0.2–1.2)
Total Protein: 6.6 g/dL (ref 6.1–8.1)
eGFR: 68 mL/min/{1.73_m2} (ref >=60)

## 2021-08-07 LAB — CBC WITH DIFFERENTIAL/PLATELET
Absolute Monocytes: 340 cells/uL (ref 200–950)
Basophils Absolute: 22 cells/uL (ref 0–200)
Basophils Relative: 0.4 %
Eosinophils Absolute: 49 cells/uL (ref 15–500)
Eosinophils Relative: 0.9 %
HCT: 40.2 % (ref 35.0–45.0)
Hemoglobin: 13.1 g/dL (ref 11.7–15.5)
Lymphs Abs: 3348 cells/uL (ref 850–3900)
MCH: 30.8 pg (ref 27.0–33.0)
MCHC: 32.6 g/dL (ref 32.0–36.0)
MCV: 94.6 fL (ref 80.0–100.0)
MPV: 9.1 fL (ref 7.5–12.5)
Monocytes Relative: 6.3 %
Neutro Abs: 1642 cells/uL (ref 1500–7800)
Neutrophils Relative %: 30.4 %
Platelets: 277 10*3/uL (ref 140–400)
RBC: 4.25 10*6/uL (ref 3.80–5.10)
RDW: 12.7 % (ref 11.0–15.0)
Total Lymphocyte: 62 %
WBC: 5.4 10*3/uL (ref 3.8–10.8)

## 2021-08-07 LAB — LIPID PANEL
Cholesterol: 193 mg/dL (ref <200)
HDL: 79 mg/dL (ref >=50)
LDL Cholesterol (Calc): 97 mg/dL (calc)
Non-HDL Cholesterol (Calc): 114 mg/dL (calc) (ref <130)
Total CHOL/HDL Ratio: 2.4 (calc) (ref <5.0)
Triglycerides: 78 mg/dL (ref <150)

## 2021-08-07 LAB — HEMOGLOBIN A1C
Hgb A1c MFr Bld: 5.6 % of total Hgb (ref <5.7)
Mean Plasma Glucose: 114 mg/dL
eAG (mmol/L): 6.3 mmol/L

## 2021-08-14 ENCOUNTER — Encounter: Payer: Self-pay | Admitting: Gastroenterology

## 2021-08-14 ENCOUNTER — Ambulatory Visit (INDEPENDENT_AMBULATORY_CARE_PROVIDER_SITE_OTHER): Payer: Medicare Other | Admitting: Gastroenterology

## 2021-08-14 ENCOUNTER — Other Ambulatory Visit: Payer: Self-pay

## 2021-08-14 VITALS — Ht 59.0 in

## 2021-08-14 DIAGNOSIS — R635 Abnormal weight gain: Secondary | ICD-10-CM | POA: Diagnosis not present

## 2021-08-14 DIAGNOSIS — R12 Heartburn: Secondary | ICD-10-CM

## 2021-08-14 DIAGNOSIS — R195 Other fecal abnormalities: Secondary | ICD-10-CM

## 2021-08-14 MED ORDER — FAMOTIDINE 20 MG PO TABS: 20.0000 mg | ORAL_TABLET | Freq: Every day | ORAL | 0 refills | Status: DC

## 2021-08-14 MED ORDER — OMEPRAZOLE 20 MG PO CPDR
20.0000 mg | DELAYED_RELEASE_CAPSULE | Freq: Every day | ORAL | 0 refills | Status: DC
Start: 2021-08-14 — End: 2022-08-26

## 2021-08-14 NOTE — Addendum Note (Signed)
Addended by: Lurlean Nanny on: 08/14/2021 05:06 PM   Modules accepted: Orders

## 2021-08-14 NOTE — Patient Instructions (Signed)
Referral to see Nutritionist has been placed. They will give you a call to schedule appointment

## 2021-08-14 NOTE — Progress Notes (Signed)
Vonda Antigua, MD [address]  [place], [place] [address]  Main: [phone]  Fax: [phone]   Primary Care Physician: Delsa Grana, PA-C   Chief complaint: Heartburn  HPI: Elizabeth Mata is a 60 y.o. female here for follow-up of reflux.  Patient has been taking Pepcid at bedtime, and uses daytime Pepcid as needed when symptoms occur.  No dysphagia.  No weight loss.  States has gained weight and would like to work on weight loss.  Loose bowel movements are better as well.  Colon biopsies were negative for microscopic colitis.  ROS: All ROS reviewed and negative except as per HPI   Past Medical History:  Diagnosis Date   Allergic rhinitis 06/30/2016   Allergy    Carotid atherosclerosis, bilateral 11/06/2017   [place] Nov 2018   Cyst of breast, left, solitary    Flank pain    GERD (gastroesophageal reflux disease)    Hemorrhoid    Hyperlipidemia    IFG (impaired fasting glucose) 07/18/2016   June 2017    Kidney stones    Neuropathy 07/24/2016   Seizures ([place])    Shingles outbreak 02/03/2017   Syncope    Recurrent over the last 2 years. had workup at [facility] this year with cardiology and neurology. Had echo and 3-week event monitor that were unremarkable. Has had negative EEG. Echo in 11/11 at [facility] showed no significant abnormalities. Possibly vasovagal   Trigger thumb     Past Surgical History:  Procedure Laterality Date   ABDOMINAL HYSTERECTOMY  40s   endometriosis, fibroid tumors; no cancer, partial   BREAST EXCISIONAL BIOPSY Left 1980'S   EXCISIONAL - NEG   BREAST SURGERY     CHOLECYSTECTOMY  31   COLONOSCOPY WITH PROPOFOL N/A 06/23/2019   Procedure: COLONOSCOPY WITH PROPOFOL;  Surgeon: Virgel Manifold, MD;  Location: [facility] END[facility]COPY;  Service: Endoscopy;  Laterality: N/A;   COLONOSCOPY WITH PROPOFOL N/A 12/28/2020   Procedure: COLONOSCOPY WITH PROPOFOL;  Surgeon: Virgel Manifold, MD;  Location: [facility];  Service: Endoscopy;   Laterality: N/A;   ESOPHAGOGASTRODUODENOSCOPY (EGD) WITH PROPOFOL N/A 06/23/2019   Procedure: ESOPHAGOGASTRODUODENOSCOPY (EGD) WITH PROPOFOL;  Surgeon: Virgel Manifold, MD;  Location: [facility];  Service: Endoscopy;  Laterality: N/A;   ESOPHAGOGASTRODUODENOSCOPY (EGD) WITH PROPOFOL N/A 12/28/2020   Procedure: ESOPHAGOGASTRODUODENOSCOPY (EGD) WITH PROPOFOL;  Surgeon: Virgel Manifold, MD;  Location: [facility] END[facility];  Service: Endoscopy;  Laterality: N/A;   FOOT SURGERY Right    tendon release    Prior to Admission medications   Medication Sig Start Date End Date Taking? Authorizing Provider  aspirin EC 81 MG EC tablet Take 1 tablet (81 mg total) by mouth daily. 10/10/17  Yes Henreitta Leber, MD  atorvastatin (LIPITOR) 80 MG tablet Take 1 tablet (80 mg total) by mouth daily. 08/06/21  Yes Delsa Grana, PA-C  gabapentin (NEURONTIN) 100 MG capsule Take 300 mg by mouth at bedtime. 04/06/21  Yes [provider]  loperamide (IMODIUM A-D) 2 MG tablet Take 1 tablet (2 mg total) by mouth as needed for diarrhea or loose stools. 05/14/21  Yes Virgel Manifold, MD  loratadine (CLARITIN) 10 MG tablet Take 10 mg by mouth daily.   Yes [provider]  topiramate (TOPAMAX) 200 MG tablet Take 200 mg by mouth 2 (two) times daily.  01/23/17  Yes [provider]  UBRELVY 100 MG TABS Take 1 tablet by mouth as needed. 04/10/21  Yes [provider]  famotidine (  PEPCID) 20 MG tablet Take 1 tablet (20 mg total) by mouth daily. 08/14/21   Virgel Manifold, MD  omeprazole (PRILOSEC) 20 MG capsule Take 1 capsule (20 mg total) by mouth daily. 08/14/21   Virgel Manifold, MD  triamcinolone (NASACORT) 55 MCG/ACT AERO nasal inhaler Place 2 sprays into the nose daily. Patient not taking: Reported on 08/14/2021 08/06/21   Delsa Grana, PA-C    Family History  Problem Relation Age of Onset   Coronary artery disease Maternal Uncle    Diabetes Mellitus II Mother    Arthritis  Mother    Diabetes Maternal Uncle    Kidney cancer Father 36       died 26-Apr-2017   Kidney disease Father    Cancer Father        kidney and bladder   Arthritis Maternal Uncle    Cancer Maternal Grandmother        stomach?   Jaundice Maternal Grandmother    Stomach cancer Paternal Grandmother    Cancer Paternal Grandmother        stomach   Hypertension Sister    Stroke Maternal Grandfather    Diabetes Paternal Grandfather    Diabetes Sister    Hypertension Sister    Breast cancer Neg Hx    Bladder Cancer Neg Hx      Social History   Tobacco Use   Smoking status: Never   Smokeless tobacco: Never  Vaping Use   Vaping Use: Never used  Substance Use Topics   Alcohol use: Not Currently    Alcohol/week: 0.0 standard drinks   Drug use: Never    Allergies as of 08/14/2021 - Review Complete 08/14/2021  Allergen Reaction Noted   Clindamycin/lincomycin Hives 11/10/2015   Hydrocodone Nausea Only 03/01/2015   Metronidazole Hives 11/07/2010   Sulfa antibiotics Hives 07/05/2014   Oxycodone Nausea And Vomiting 02/05/2017    Physical Examination:  Constitutional: General:   Alert,  Well-developed, well-nourished, pleasant and cooperative in NAD Ht '4\' 11"'$  (1.499 m)   BMI 28.22 kg/m   Respiratory: Normal respiratory effort  Gastrointestinal:  Soft, non-tender and non-distended without masses, hepatosplenomegaly or hernias noted.  No guarding or rebound tenderness.     Cardiac: No clubbing or edema.  No cyanosis. Normal posterior tibial pedal pulses noted.  Psych:  Alert and cooperative. Normal mood and affect.  Musculoskeletal:  Normal gait. Head normocephalic, atraumatic. Symmetrical without gross deformities. 5/5 Lower extremity strength bilaterally.  Skin: Warm. Intact without significant lesions or rashes. No jaundice.  Neck: Supple, trachea midline  Lymph: No cervical lymphadenopathy  Psych:  Alert and oriented x3, Alert and cooperative. Normal mood and  affect.  Labs: CMP     Component Value Date/Time   NA 144 08/06/2021 1504   NA 145 (H) 10/24/2015 1645   NA 144 12/31/2014 1450   K 4.0 08/06/2021 1504   K 3.6 12/31/2014 1450   CL 112 (H) 08/06/2021 1504   CL 113 (H) 12/31/2014 1450   CO2 23 08/06/2021 1504   CO2 21 12/31/2014 1450   GLUCOSE 96 08/06/2021 1504   GLUCOSE 107 (H) 12/31/2014 1450   BUN 14 08/06/2021 1504   BUN 11 10/24/2015 1645   BUN 17 12/31/2014 1450   CREATININE 0.96 08/06/2021 1504   CALCIUM 10.0 08/06/2021 1504   CALCIUM 9.4 12/31/2014 1450   PROT 6.6 08/06/2021 1504   PROT 6.5 07/21/2019 1535   PROT 7.8 05/03/2012 0903   ALBUMIN 4.2 08/05/2020 0533  ALBUMIN 4.4 07/21/2019 1535   ALBUMIN 3.8 05/03/2012 0903   AST 27 08/06/2021 1504   AST 30 05/03/2012 0903   ALT 22 08/06/2021 1504   ALT 25 05/03/2012 0903   ALKPHOS 66 08/05/2020 0533   ALKPHOS 88 05/03/2012 0903   BILITOT 0.4 08/06/2021 1504   BILITOT 0.4 07/21/2019 1535   BILITOT 0.6 05/03/2012 0903   GFRNONAA >60 08/05/2020 0533   GFRNONAA 70 05/18/2019 1137   GFRAA >60 08/05/2020 0533   GFRAA 81 05/18/2019 1137   Lab Results  Component Value Date   WBC 5.4 08/06/2021   HGB 13.1 08/06/2021   HCT 40.2 08/06/2021   MCV 94.6 08/06/2021   PLT 277 08/06/2021    Imaging Studies:   Assessment and Plan:   Elizabeth Mata is a 60 y.o. y/o female here for follow-up of reflux  Will start PPI once daily given that patient is continuing to use Pepcid at bedtime and additional Pepcid as needed during the day  PPI once daily for 30 days and then patient can resume her Pepcid at bedtime.  She was having significant nighttime symptoms without Pepcid and therefore okay to continue in the future as it may help her prevent PPI therapy as well and prevent symptoms.  Loose stools have improved.  If recur, can consider fecal elastase as it was previously ordered but patient never got it done  (Risks of PPI use were discussed with patient including  bone loss, C. Diff diarrhea, pneumonia, infections, CKD, electrolyte abnormalities.  Pt. Verbalizes understanding and chooses to continue the medication.)    Patient is interested in nutritionist referral to work on weight loss.  We will place.  Dr Vonda Antigua

## 2021-09-04 ENCOUNTER — Other Ambulatory Visit: Payer: Self-pay | Admitting: Family Medicine

## 2021-09-04 DIAGNOSIS — Z1231 Encounter for screening mammogram for malignant neoplasm of breast: Secondary | ICD-10-CM

## 2021-09-26 ENCOUNTER — Other Ambulatory Visit: Payer: Self-pay

## 2021-09-26 ENCOUNTER — Ambulatory Visit
Admission: RE | Admit: 2021-09-26 | Discharge: 2021-09-26 | Disposition: A | Discharge status: Home / Self Care | Payer: Medicare HMO | Source: Ambulatory Visit | Attending: Family Medicine | Admitting: Family Medicine

## 2021-09-26 DIAGNOSIS — Z1231 Encounter for screening mammogram for malignant neoplasm of breast: Secondary | ICD-10-CM | POA: Insufficient documentation

## 2021-10-10 ENCOUNTER — Ambulatory Visit (INDEPENDENT_AMBULATORY_CARE_PROVIDER_SITE_OTHER): Payer: Medicare HMO

## 2021-10-10 ENCOUNTER — Other Ambulatory Visit: Payer: Self-pay

## 2021-10-10 DIAGNOSIS — Z23 Encounter for immunization: Secondary | ICD-10-CM

## 2021-11-09 DIAGNOSIS — M79672 Pain in left foot: Secondary | ICD-10-CM | POA: Diagnosis not present

## 2021-11-09 DIAGNOSIS — S92515S Nondisplaced fracture of proximal phalanx of left lesser toe(s), sequela: Secondary | ICD-10-CM | POA: Diagnosis not present

## 2021-11-13 ENCOUNTER — Encounter: Payer: Self-pay | Admitting: Internal Medicine

## 2021-11-13 ENCOUNTER — Ambulatory Visit
Admission: RE | Admit: 2021-11-13 | Discharge: 2021-11-13 | Disposition: A | Discharge status: Home / Self Care | Payer: Medicare HMO | Attending: Internal Medicine | Admitting: Internal Medicine

## 2021-11-13 ENCOUNTER — Ambulatory Visit
Admission: RE | Admit: 2021-11-13 | Discharge: 2021-11-13 | Disposition: A | Discharge status: Home / Self Care | Payer: Medicare HMO | Source: Ambulatory Visit | Attending: Internal Medicine | Admitting: Internal Medicine

## 2021-11-13 ENCOUNTER — Ambulatory Visit (INDEPENDENT_AMBULATORY_CARE_PROVIDER_SITE_OTHER): Payer: Medicare HMO | Admitting: Internal Medicine

## 2021-11-13 ENCOUNTER — Other Ambulatory Visit: Payer: Self-pay

## 2021-11-13 VITALS — BP 118/82 | HR 79 | Temp 98.5°F | Resp 16 | Ht 59.0 in | Wt 137.4 lb

## 2021-11-13 DIAGNOSIS — G8929 Other chronic pain: Secondary | ICD-10-CM | POA: Insufficient documentation

## 2021-11-13 DIAGNOSIS — M25562 Pain in left knee: Secondary | ICD-10-CM

## 2021-11-13 MED ORDER — MELOXICAM 7.5 MG PO TABS: 7.5000 mg | ORAL_TABLET | Freq: Every day | ORAL | 0 refills | Status: DC

## 2021-11-13 MED ORDER — DICLOFENAC SODIUM 1 % EX GEL
4.0000 g | Freq: Four times a day (QID) | CUTANEOUS | 1 refills | Status: DC
Start: 2021-11-13 — End: 2022-09-16

## 2021-11-13 NOTE — Patient Instructions (Addendum)
It was great seeing you today!  Plan discussed at today's visit: -Left knee x-ray today -Mobic 7.5 mg sent to pharmacy, take as needed for knee pain. Take it with food and do NOT take any other anti-inflammatory (aleve, Advil, ibuprofen, etc.) Can alternate with Tylenol  -Can use Voltaren gel on knee -Next steps pending x-ray including steroid injections or orthopedic referral to discuss knee replacement.   Follow up in: 2 months   Take care and let us know if you have any questions or concerns prior to your next visit.  Dr. Rosana Berger

## 2021-11-13 NOTE — Progress Notes (Signed)
Acute Office Visit  Subjective:    Patient ID: Elizabeth Mata, female    DOB: 1961/11/27, 60 y.o.   MRN: [id number]  Chief Complaint  Patient presents with  . Knee Pain    lEFT    HPI Patient is in today for left knee pain. She last had an x-ray in the early 2000's showing osteoarthritis. She had been going to Orthopedics getting steroid injections in the past. She states that over the last several months her pain has gotten much worse with movement, particularly going up stairs.   KNEE PAIN Duration: ongoing for years but worse over last several months Involved knee: left Mechanism of injury:  None Location:diffuse Onset: gradual Severity: 5/10  Quality:  dull and aching Frequency: constant Radiation: no Aggravating factors: going up stairs makes it worse walking, stairs, and movement  Alleviating factors: nothing, rest  Status: worse Treatments attempted: Tylenol, Ibuprofen Relief with NSAIDs?:  mild Weakness with weight bearing or walking: no Sensation of giving way: no Locking: no Popping: yes Bruising: no Swelling: no Redness: no Paresthesias/decreased sensation: no Fevers: no   Past Medical History:  Diagnosis Date  . Allergic rhinitis 06/30/2016  . Allergy   . Carotid atherosclerosis, bilateral 11/06/2017   [place] Nov 2018  . Cyst of breast, left, solitary   . Flank pain   . GERD (gastroesophageal reflux disease)   . Hemorrhoid   . Hyperlipidemia   . IFG (impaired fasting glucose) 07/18/2016   June 2017   . Kidney stones   . Neuropathy 07/24/2016  . Seizures ([place])   . Shingles outbreak 02/03/2017  . Syncope    Recurrent over the last 2 years. had workup at [facility] this year with cardiology and neurology. Had echo and 3-week event monitor that were unremarkable. Has had negative EEG. Echo in 11/11 at [facility] showed no significant abnormalities. Possibly vasovagal  . Trigger thumb     Past Surgical History:  Procedure Laterality Date  . ABDOMINAL HYSTERECTOMY   40s   endometriosis, fibroid tumors; no cancer, partial  . BREAST EXCISIONAL BIOPSY Left 1980'S   EXCISIONAL - NEG  . BREAST SURGERY    . CHOLECYSTECTOMY  31  . COLONOSCOPY WITH PROPOFOL N/A 06/23/2019   Procedure: COLONOSCOPY WITH PROPOFOL;  Surgeon: Virgel Manifold, MD;  Location: [facility] ENDOSCOPY;  Service: Endoscopy;  Laterality: N/A;  . COLONOSCOPY WITH PROPOFOL N/A 12/28/2020   Procedure: COLONOSCOPY WITH PROPOFOL;  Surgeon: Virgel Manifold, MD;  Location: [facility] ENDOSCOPY;  Service: Endoscopy;  Laterality: N/A;  . ESOPHAGOGASTRODUODENOSCOPY (EGD) WITH PROPOFOL N/A 06/23/2019   Procedure: ESOPHAGOGASTRODUODENOSCOPY (EGD) WITH PROPOFOL;  Surgeon: Virgel Manifold, MD;  Location: [facility] ENDOSCOPY;  Service: Endoscopy;  Laterality: N/A;  . ESOPHAGOGASTRODUODENOSCOPY (EGD) WITH PROPOFOL N/A 12/28/2020   Procedure: ESOPHAGOGASTRODUODENOSCOPY (EGD) WITH PROPOFOL;  Surgeon: Virgel Manifold, MD;  Location: [facility] ENDOSCOPY;  Service: Endoscopy;  Laterality: N/A;  . FOOT SURGERY Right    tendon release    Family History  Problem Relation Age of Onset  . Coronary artery disease Maternal Uncle   . Diabetes Mellitus II Mother   . Arthritis Mother   . Diabetes Maternal Uncle   . Kidney cancer Father 2       died 2017-05-06  . Kidney disease Father   . Cancer Father        kidney and bladder  . Arthritis Maternal Uncle   . Cancer Maternal Grandmother        stomach?  . Jaundice  Maternal Grandmother   . Stomach cancer Paternal Grandmother   . Cancer Paternal Grandmother        stomach  . Hypertension Sister   . Stroke Maternal Grandfather   . Diabetes Paternal Grandfather   . Diabetes Sister   . Hypertension Sister   . Breast cancer Neg Hx   . Bladder Cancer Neg Hx     Social History   Socioeconomic History  . Marital status: Legally Separated    Spouse name: Not on file  . Number of children: 2  . Years of education: Not on file  . Highest education level:  GED or equivalent  Occupational History  . Occupation: unemployed  Tobacco Use  . Smoking status: Never  . Smokeless tobacco: Never  Vaping Use  . Vaping Use: Never used  Substance and Sexual Activity  . Alcohol use: Not Currently    Alcohol/week: 0.0 standard drinks  . Drug use: Never  . Sexual activity: Yes    Partners: Male    Birth control/protection: Surgical    Comment: Hysterectomy  Other Topics Concern  . Not on file  Social History Narrative   Regular exercise   Social Determinants of Health   Financial Resource Strain: Low Risk   . Difficulty of Paying Living Expenses: Not very hard  Food Insecurity: No Food Insecurity  . Worried About Charity fundraiser in the Last Year: Never true  . Ran Out of Food in the Last Year: Never true  Transportation Needs: No Transportation Needs  . Lack of Transportation (Medical): No  . Lack of Transportation (Non-Medical): No  Physical Activity: Sufficiently Active  . Days of Exercise per Week: 7 days  . Minutes of Exercise per Session: 30 min  Stress: No Stress Concern Present  . Feeling of Stress : Not at all  Social Connections: Moderately Isolated  . Frequency of Communication with Friends and Family: More than three times a week  . Frequency of Social Gatherings with Friends and Family: Three times a week  . Attends Religious Services: More than 4 times per year  . Active Member of Clubs or Organizations: No  . Attends Archivist Meetings: Never  . Marital Status: Separated  Intimate Partner Violence: Not At Risk  . Fear of Current or Ex-Partner: No  . Emotionally Abused: No  . Physically Abused: No  . Sexually Abused: No    Outpatient Medications Prior to Visit  Medication Sig Dispense Refill  . aspirin EC 81 MG EC tablet Take 1 tablet (81 mg total) by mouth daily. 30 tablet 1  . atorvastatin (LIPITOR) 80 MG tablet Take 1 tablet (80 mg total) by mouth daily. 90 tablet 3  . famotidine (PEPCID) 20 MG  tablet Take 1 tablet (20 mg total) by mouth at bedtime. 90 tablet 0  . gabapentin (NEURONTIN) 100 MG capsule Take 300 mg by mouth at bedtime.    Marland Kitchen loperamide (IMODIUM A-D) 2 MG tablet Take 1 tablet (2 mg total) by mouth as needed for diarrhea or loose stools. 30 tablet 0  . loratadine (CLARITIN) 10 MG tablet Take 10 mg by mouth daily.    Marland Kitchen omeprazole (PRILOSEC) 20 MG capsule Take 1 capsule (20 mg total) by mouth daily. 30 capsule 0  . topiramate (TOPAMAX) 200 MG tablet Take 200 mg by mouth 2 (two) times daily.   11  . triamcinolone (NASACORT) 55 MCG/ACT AERO nasal inhaler Place 2 sprays into the nose daily. 1 each 12  .  UBRELVY 100 MG TABS Take 1 tablet by mouth as needed.     No facility-administered medications prior to visit.    Allergies  Allergen Reactions  . Clindamycin/Lincomycin Hives  . Hydrocodone Nausea Only  . Metronidazole Hives  . Sulfa Antibiotics Hives    Break out in hives all over  . Oxycodone Nausea And Vomiting    Review of Systems  Constitutional:  Negative for chills and fever.  Respiratory:  Negative for cough and shortness of breath.   Cardiovascular:  Negative for chest pain and leg swelling.  Musculoskeletal:  Positive for arthralgias. Negative for joint swelling.  Skin: Negative.   Neurological:  Negative for numbness.      Objective:    Physical Exam Constitutional:      Appearance: Normal appearance.  HENT:     Head: Normocephalic and atraumatic.  Eyes:     Conjunctiva/sclera: Conjunctivae normal.  Cardiovascular:     Rate and Rhythm: Normal rate and regular rhythm.  Pulmonary:     Effort: Pulmonary effort is normal.     Breath sounds: Normal breath sounds.  Musculoskeletal:        General: Tenderness present. Normal range of motion.     Right knee: Normal.     Left knee: Bony tenderness and crepitus present. No swelling, deformity, effusion or erythema. No LCL laxity, MCL laxity, ACL laxity or PCL laxity.Normal patellar mobility.      Instability Tests: Anterior drawer test negative. Posterior drawer test negative. Anterior Lachman test negative. Medial McMurray test negative and lateral McMurray test negative.     Right lower leg: No edema.     Left lower leg: No edema.  Skin:    General: Skin is warm and dry.  Neurological:     General: No focal deficit present.     Mental Status: She is alert. Mental status is at baseline.  Psychiatric:        Mood and Affect: Mood normal.        Behavior: Behavior normal.    BP 118/82   Pulse 79   Temp 98.5 F (36.9 C)   Resp 16   Ht 4' 11"  (1.499 m)   Wt 137 lb 6.4 oz (62.3 kg)   SpO2 99%   BMI 27.75 kg/m  Wt Readings from Last 3 Encounters:  11/13/21 137 lb 6.4 oz (62.3 kg)  08/06/21 139 lb 11.2 oz (63.4 kg)  05/23/21 140 lb (63.5 kg)    Health Maintenance Due  Topic Date Due  . Zoster Vaccines- Shingrix (1 of 2) Never done  . COVID-19 Vaccine (3 - Booster for Pfizer series) 06/14/2020    There are no preventive care reminders to display for this patient.   Lab Results  Component Value Date   TSH 1.589 10/27/2018   Lab Results  Component Value Date   WBC 5.4 08/06/2021   HGB 13.1 08/06/2021   HCT 40.2 08/06/2021   MCV 94.6 08/06/2021   PLT 277 08/06/2021   Lab Results  Component Value Date   NA 144 08/06/2021   K 4.0 08/06/2021   CO2 23 08/06/2021   GLUCOSE 96 08/06/2021   BUN 14 08/06/2021   CREATININE 0.96 08/06/2021   BILITOT 0.4 08/06/2021   ALKPHOS 66 08/05/2020   AST 27 08/06/2021   ALT 22 08/06/2021   PROT 6.6 08/06/2021   ALBUMIN 4.2 08/05/2020   CALCIUM 10.0 08/06/2021   ANIONGAP 8 08/05/2020   EGFR 68 08/06/2021   Lab Results  Component Value Date   CHOL 193 08/06/2021   Lab Results  Component Value Date   HDL 79 08/06/2021   Lab Results  Component Value Date   LDLCALC 97 08/06/2021   Lab Results  Component Value Date   TRIG 78 08/06/2021   Lab Results  Component Value Date   CHOLHDL 2.4 08/06/2021   Lab  Results  Component Value Date   HGBA1C 5.6 08/06/2021       Assessment & Plan:   1. Chronic pain of left knee: An x-ray will be obtained to assess for osteoarthritis. We discussed treatment with topical NSAIDs as well as Mobic 7.5 mg for the pain. Based on x-ray, she can come here for steroid injections or be referred to Ortho to discuss knee replacement. Follow up in 2 months for recheck and medical follow up.  - DG Knee Complete 4 Views Left; Future - meloxicam (MOBIC) 7.5 MG tablet; Take 1 tablet (7.5 mg total) by mouth daily.  Dispense: 30 tablet; Refill: 0 - diclofenac Sodium (VOLTAREN) 1 % GEL; Apply 4 g topically 4 (four) times daily.  Dispense: 100 g; Refill: [place], DO

## 2021-11-15 ENCOUNTER — Telehealth: Payer: Self-pay | Admitting: Family Medicine

## 2021-11-15 DIAGNOSIS — M25562 Pain in left knee: Secondary | ICD-10-CM

## 2021-11-15 NOTE — Telephone Encounter (Signed)
Pt was informed about referral being placed.

## 2021-11-15 NOTE — Telephone Encounter (Signed)
Referral Request - Has patient seen PCP for this complaint? Yes.   *If NO, is insurance requiring patient see PCP for this issue before PCP can refer them? Referral for which specialty: Ortho Preferred provider/office: [facility] Reason for referral: knee pain  Please proceed with referral.

## 2021-11-27 ENCOUNTER — Ambulatory Visit: Payer: Medicare Other | Admitting: Gastroenterology

## 2021-12-20 ENCOUNTER — Ambulatory Visit: Payer: Medicare HMO | Admitting: Gastroenterology

## 2022-01-14 ENCOUNTER — Ambulatory Visit: Payer: Medicare HMO | Admitting: Nurse Practitioner

## 2022-03-04 ENCOUNTER — Ambulatory Visit: Payer: Medicare HMO | Admitting: Dermatology

## 2022-03-26 ENCOUNTER — Ambulatory Visit (INDEPENDENT_AMBULATORY_CARE_PROVIDER_SITE_OTHER): Payer: Medicare HMO | Admitting: Family Medicine

## 2022-03-26 ENCOUNTER — Encounter: Payer: Self-pay | Admitting: Family Medicine

## 2022-03-26 VITALS — BP 116/78 | HR 77 | Temp 97.6°F | Resp 16 | Ht 59.0 in | Wt 137.3 lb

## 2022-03-26 DIAGNOSIS — Z23 Encounter for immunization: Secondary | ICD-10-CM

## 2022-03-26 DIAGNOSIS — R21 Rash and other nonspecific skin eruption: Secondary | ICD-10-CM | POA: Diagnosis not present

## 2022-03-26 MED ORDER — ZOSTER VAC RECOMB ADJUVANTED 50 MCG/0.5ML IM SUSR: 0.5000 mL | Freq: Once | INTRAMUSCULAR | 1 refills | Status: AC

## 2022-03-26 NOTE — Patient Instructions (Addendum)
Cerave or cetaphil lotions or creams  ?After lotion/cream do application of vaseline  ? ?For severe itching use cortisone 10 or hydrocortisone 1% cream over the counter very sparingly (one thin layer application daily) to itchy areas for only up to one week - try to avoid using more than that to avoid long term skin changes (thinning, hypopigmentation) ? ?Try calling [facility] to see if they can get you in faster since you are a prior patient ?

## 2022-03-26 NOTE — Progress Notes (Signed)
? ? ?Patient ID: Elizabeth Mata, female    DOB: [birth year], 61 y.o.   MRN: [id number] ? ?PCP: Delsa Grana, PA-C ? ?Chief Complaint  ?Patient presents with  ? Skin Discoloration  ?  Pt states around lips and eyes. Pt states it feels itchy onset for few months.  ? ? ?Subjective:  ? ?Elizabeth Mata is a 61 y.o. female, presents to clinic with CC of the following: ? ?HPI  ?Pt presents with itching/rash around mouth and eyebrows with skin pigment changes - lighter to upper lip and around eyebrows and darker to forehead ?Using "every kind of cream and lotion" she has ?Itches ?Onset a few weeks ago ?She has hx of hives/seasonal allergies is on her allergy meds - not having rash or hives anywhere else currently ?No lip swelling ? ? ? ?Patient Active Problem List  ? Diagnosis Date Noted  ? BV (bacterial vaginosis) 05/23/2021  ? Pelvic floor weakness in female 03/03/2020  ? LLQ pain 03/03/2020  ? History of colonic polyps   ? Polyp of ascending colon   ? Hemorrhoids   ? Stomach irritation   ? Abdominal pain, epigastric   ? Dizziness 10/27/2018  ? Palpitations 10/27/2018  ? Urinary urgency 04/21/2018  ? Menopausal symptom 04/21/2018  ? Urticaria 01/05/2018  ? Carotid atherosclerosis, bilateral 11/06/2017  ? TIA (transient ischemic attack) 10/28/2017  ? Hx of completed stroke 10/28/2017  ? Ataxia 10/08/2017  ? Breast pain in female 07/11/2017  ? Medication monitoring encounter 12/20/2016  ? Abnormality of globulin 07/26/2016  ? Neuropathy 07/24/2016  ? Skin pigmentation disorder 07/24/2016  ? IFG (impaired fasting glucose) 07/18/2016  ? Hematuria 07/03/2016  ? Allergic rhinitis 06/30/2016  ? Chronic left hip pain 06/28/2016  ? Breast cancer screening 04/17/2016  ? Skin texture changes 12/27/2015  ? Vaginal discharge 10/31/2015  ? Screening for STD (sexually transmitted disease) 10/31/2015  ? Kidney stone on left side 10/24/2015  ? Annual physical exam 09/13/2015  ? Encounter for screening mammogram for malignant neoplasm of  breast 09/13/2015  ? Need for immunization against influenza 09/13/2015  ? Eczema 09/13/2015  ? Irritable bowel syndrome with constipation 09/13/2015  ? Seizures ([place]) 08/16/2015  ? Carpal tunnel syndrome 07/26/2014  ? Carotid artery narrowing 07/05/2013  ? Basilar artery migraine 07/06/2012  ? ? ? ? ?Current Outpatient Medications:  ?  aspirin EC 81 MG EC tablet, Take 1 tablet (81 mg total) by mouth daily., Disp: 30 tablet, Rfl: 1 ?  atorvastatin (LIPITOR) 80 MG tablet, Take 1 tablet (80 mg total) by mouth daily., Disp: 90 tablet, Rfl: 3 ?  diclofenac Sodium (VOLTAREN) 1 % GEL, Apply 4 g topically 4 (four) times daily., Disp: 100 g, Rfl: 1 ?  famotidine (PEPCID) 20 MG tablet, Take 1 tablet (20 mg total) by mouth at bedtime., Disp: 90 tablet, Rfl: 0 ?  gabapentin (NEURONTIN) 100 MG capsule, Take 300 mg by mouth at bedtime., Disp: , Rfl:  ?  loperamide (IMODIUM A-D) 2 MG tablet, Take 1 tablet (2 mg total) by mouth as needed for diarrhea or loose stools., Disp: 30 tablet, Rfl: 0 ?  loratadine (CLARITIN) 10 MG tablet, Take 10 mg by mouth daily., Disp: , Rfl:  ?  meloxicam (MOBIC) 7.5 MG tablet, Take 1 tablet (7.5 mg total) by mouth daily., Disp: 30 tablet, Rfl: 0 ?  omeprazole (PRILOSEC) 20 MG capsule, Take 1 capsule (20 mg total) by mouth daily., Disp: 30 capsule, Rfl: 0 ?  topiramate (TOPAMAX) 200 MG  tablet, Take 200 mg by mouth 2 (two) times daily. , Disp: , Rfl: 11 ?  triamcinolone (NASACORT) 55 MCG/ACT AERO nasal inhaler, Place 2 sprays into the nose daily., Disp: 1 each, Rfl: 12 ?  UBRELVY 100 MG TABS, Take 1 tablet by mouth as needed., Disp: , Rfl:  ? ? ?Allergies  ?Allergen Reactions  ? Clindamycin/Lincomycin Hives  ? Hydrocodone Nausea Only  ? Metronidazole Hives  ? Sulfa Antibiotics Hives  ?  Break out in hives all over  ? Oxycodone Nausea And Vomiting  ? ? ? ?Social History  ? ?Tobacco Use  ? Smoking status: Never  ? Smokeless tobacco: Never  ?Vaping Use  ? Vaping Use: Never used  ?Substance Use Topics  ?  Alcohol use: Not Currently  ?  Alcohol/week: 0.0 standard drinks  ? Drug use: Never  ?  ? ? ?Chart Review Today: ?I personally reviewed active problem list, medication list, allergies, family history, social history, health maintenance, notes from last encounter, lab results, imaging with the patient/caregiver today. ? ? ?Review of Systems  ?Constitutional: Negative.   ?HENT: Negative.    ?Eyes: Negative.   ?Respiratory: Negative.    ?Cardiovascular: Negative.   ?Gastrointestinal: Negative.   ?Endocrine: Negative.   ?Genitourinary: Negative.   ?Musculoskeletal: Negative.   ?Skin: Negative.   ?Allergic/Immunologic: Negative.   ?Neurological: Negative.   ?Hematological: Negative.   ?Psychiatric/Behavioral: Negative.    ?All other systems reviewed and are negative. ? ?   ?Objective:  ? ?Vitals:  ? 03/26/22 0948  ?BP: 116/78  ?Pulse: 77  ?Resp: 16  ?Temp: 97.6 ?F (36.4 ?C)  ?TempSrc: Oral  ?SpO2: 100%  ?Weight: 137 lb 4.8 oz (62.3 kg)  ?Height: '4\' 11"'$  (1.499 m)  ?  ?Body mass index is 27.73 kg/m?. ? ?Physical Exam ?Vitals and nursing note reviewed.  ?Constitutional:   ?   General: She is not in acute distress. ?   Appearance: She is not toxic-appearing or diaphoretic.  ?HENT:  ?   Head: Normocephalic and atraumatic.  ?   Right Ear: External ear normal.  ?   Left Ear: External ear normal.  ?   Nose: Nose normal. No congestion.  ?Eyes:  ?   General: No scleral icterus.    ?   Right eye: No discharge.     ?   Left eye: No discharge.  ?   Conjunctiva/sclera: Conjunctivae normal.  ?   Pupils: Pupils are equal, round, and reactive to light.  ?Pulmonary:  ?   Effort: No respiratory distress.  ?Skin: ?   Comments: Slightly lighter skin just superior to upper lip vermilion border with small scattered maculopapular erythematous rash (very slight) ?Hypopigmentation around both eyebrows above and below, with no noted rash or swelling  ?Neurological:  ?   Mental Status: She is alert. Mental status is at baseline.  ?Psychiatric:      ?   Mood and Affect: Mood normal.     ?   Behavior: Behavior normal.  ?  ? ? ? ?   ?Assessment & Plan:  ? ?1. Rash of face ?Encouraged hypoallergenic creams/lotions for hydration - like cerave or cetaphil ?Also encouraged vaseline afterward to any dry areas ?Continue taking allergy med and may want to double up on it with her hx of allergies/hives ?Avoid using multiple different lotions and moisturizers (particularly avoid anything that does not day hypoallergenic)  ?Can continue to use facial sun block  ?Only if itching is severe did I recommend hydrocortisone  1% cream thin application only to itchy raised areas with skin intact for only 2-3 days to see if itching/rash will improve - did discuss se of steroid use on face (higher strengths or longer use) and encouraged minimal use ?F/up with dermatology if not improving ?- Ambulatory referral to Dermatology ? ?2. Need for shingles vaccine ?Discussed -sent to pharmacy if she checks with her insurance I welcomed her to return to our office to get her shingles shot ?- Zoster Vaccine Adjuvanted [facility]) injection; Inject 0.5 mLs into the muscle once for 1 dose. And repeat once more in 2-6 months  Dispense: 0.5 mL; Refill: 1 ? ? ? ? ? ?Delsa Grana, PA-C ?03/26/22 9:57 AM ? ?

## 2022-04-03 ENCOUNTER — Telehealth: Payer: Self-pay

## 2022-04-03 NOTE — Telephone Encounter (Signed)
LVM for patient advising appointment on 05/09/22 has been moved to 05/06/22 at 10:15 am. ?Lurlean Horns., RMA ?

## 2022-04-08 DIAGNOSIS — G43109 Migraine with aura, not intractable, without status migrainosus: Secondary | ICD-10-CM | POA: Diagnosis not present

## 2022-04-08 DIAGNOSIS — I6529 Occlusion and stenosis of unspecified carotid artery: Secondary | ICD-10-CM | POA: Diagnosis not present

## 2022-04-08 DIAGNOSIS — G250 Essential tremor: Secondary | ICD-10-CM | POA: Diagnosis not present

## 2022-04-08 DIAGNOSIS — I6381 Other cerebral infarction due to occlusion or stenosis of small artery: Secondary | ICD-10-CM | POA: Diagnosis not present

## 2022-04-08 DIAGNOSIS — G43009 Migraine without aura, not intractable, without status migrainosus: Secondary | ICD-10-CM | POA: Diagnosis not present

## 2022-04-08 DIAGNOSIS — I6523 Occlusion and stenosis of bilateral carotid arteries: Secondary | ICD-10-CM | POA: Diagnosis not present

## 2022-05-06 ENCOUNTER — Ambulatory Visit: Payer: Medicare HMO | Admitting: Dermatology

## 2022-05-09 ENCOUNTER — Ambulatory Visit: Payer: Medicare HMO | Admitting: Dermatology

## 2022-05-09 ENCOUNTER — Ambulatory Visit (INDEPENDENT_AMBULATORY_CARE_PROVIDER_SITE_OTHER): Payer: Medicare HMO

## 2022-05-09 ENCOUNTER — Ambulatory Visit (INDEPENDENT_AMBULATORY_CARE_PROVIDER_SITE_OTHER): Payer: Medicare HMO | Admitting: Dermatology

## 2022-05-09 DIAGNOSIS — R21 Rash and other nonspecific skin eruption: Secondary | ICD-10-CM | POA: Diagnosis not present

## 2022-05-09 DIAGNOSIS — L720 Epidermal cyst: Secondary | ICD-10-CM | POA: Diagnosis not present

## 2022-05-09 DIAGNOSIS — Z0000 Encounter for general adult medical examination without abnormal findings: Secondary | ICD-10-CM

## 2022-05-09 MED ORDER — TACROLIMUS 0.1 % EX OINT: TOPICAL_OINTMENT | CUTANEOUS | 1 refills | Status: DC

## 2022-05-09 NOTE — Progress Notes (Signed)
? ?  Follow-Up Visit ?  ?Subjective  ?Elizabeth Mata is a 61 y.o. female who presents for the following: Rash (Patient here today for dryness, burning and itching at face that she noticed a few months ago. She has used multiple over the counter products. Patient also has some lightning of the skin and blackheads. ). ? ?No new products that patient has used at her face. She uses Oil of Olay and witch hazel. Patient not using any topical prescriptions at face. She does have a hx of eczema and year round allergies.  ? ?The following portions of the chart were reviewed this encounter and updated as appropriate:  ?  ?  ? ?Review of Systems:  No other skin or systemic complaints except as noted in HPI or Assessment and Plan. ? ?Objective  ?Well appearing patient in no apparent distress; mood and affect are within normal limits. ? ?A focused examination was performed including face. Relevant physical exam findings are noted in the Assessment and Plan. ? ?Lower lip vermilion edge ?Multiple firm smooth white papule(s).  ? ?face ?Mild hyperpigmentation at chin, cheeks, mild erythema at central upper lip ?No active rash today, although pt still has itching ? ? ? ? ?Assessment & Plan  ?[place] ?Lower lip vermilion edge ? ?- type of cyst ?- benign ?- may be extracted if symptomatic ?- observe  ? ?Once facial rash calmed down, consider adding a retinoid to lower lip edge/chin.  ? ?Rash ?face ? ?Possible contact vrs atopic dermatitis ? ?Start tacrolimus 0.1% ointment 1-2 times daily to face as needed for itch.  ?Recommend Vanicream face cleanser and moisturizer. D/c all other facial products including witch hazel until itching has resolved ? ?tacrolimus (PROTOPIC) 0.1 % ointment - face ?Apply to face 1-2 times daily as needed for itch. ? ? ?Return in about 4 weeks (around 06/06/2022) for Rash. ? ?Graciella Belton, RMA, am acting as scribe for Brendolyn Patty, MD . ? ?Documentation: I have reviewed the above documentation for accuracy  and completeness, and I agree with the above. ? ?Brendolyn Patty MD  ? ?

## 2022-05-09 NOTE — Patient Instructions (Signed)
Elizabeth Mata , ?Thank you for taking time to come for your Medicare Wellness Visit. I appreciate your ongoing commitment to your health goals. Please review the following plan we discussed and let me know if I can assist you in the future.  ? ?Screening recommendations/referrals: ?Colonoscopy: done 12/28/20. Repeat 11/2025 ?Mammogram: done 09/26/21 ?Bone Density: due age 61 ?Recommended yearly ophthalmology/optometry visit for glaucoma screening and checkup ?Recommended yearly dental visit for hygiene and checkup ? ?Vaccinations: ?Influenza vaccine: done 10/10/21 ?Pneumococcal vaccine: due ?Tdap vaccine: done 10/27/17 ?Shingles vaccine: done 03/2022; due for second dose 06/2022   ?Covid-19:done 03/25/20 & 04/25/20 ? ?Advanced directives: Advance directive discussed with you today. I have provided a copy for you to complete at home and have notarized. Once this is complete please bring a copy in to our office so we can scan it into your chart.  ? ?Conditions/risks identified: Keep up the great work! ? ?Next appointment: Follow up in one year for your annual wellness visit  ? ? ?Preventive Care 7 Years and Older, Female ?Preventive care refers to lifestyle choices and visits with your health care provider that can promote health and wellness. ?What does preventive care include? ?A yearly physical exam. This is also called an annual well check. ?Dental exams once or twice a year. ?Routine eye exams. Ask your health care provider how often you should have your eyes checked. ?Personal lifestyle choices, including: ?Daily care of your teeth and gums. ?Regular physical activity. ?Eating a healthy diet. ?Avoiding tobacco and drug use. ?Limiting alcohol use. ?Practicing safe sex. ?Taking low-dose aspirin every day. ?Taking vitamin and mineral supplements as recommended by your health care provider. ?What happens during an annual well check? ?The services and screenings done by your health care provider during your annual well  check will depend on your age, overall health, lifestyle risk factors, and family history of disease. ?Counseling  ?Your health care provider may ask you questions about your: ?Alcohol use. ?Tobacco use. ?Drug use. ?Emotional well-being. ?Home and relationship well-being. ?Sexual activity. ?Eating habits. ?History of falls. ?Memory and ability to understand (cognition). ?Work and work Statistician. ?Reproductive health. ?Screening  ?You may have the following tests or measurements: ?Height, weight, and BMI. ?Blood pressure. ?Lipid and cholesterol levels. These may be checked every 5 years, or more frequently if you are over 9 years old. ?Skin check. ?Lung cancer screening. You may have this screening every year starting at age 28 if you have a 30-pack-year history of smoking and currently smoke or have quit within the past 15 years. ?Fecal occult blood test (FOBT) of the stool. You may have this test every year starting at age 54. ?Flexible sigmoidoscopy or colonoscopy. You may have a sigmoidoscopy every 5 years or a colonoscopy every 10 years starting at age 85. ?Hepatitis C blood test. ?Hepatitis B blood test. ?Sexually transmitted disease (STD) testing. ?Diabetes screening. This is done by checking your blood sugar (glucose) after you have not eaten for a while (fasting). You may have this done every 1-3 years. ?Bone density scan. This is done to screen for osteoporosis. You may have this done starting at age 75. ?Mammogram. This may be done every 1-2 years. Talk to your health care provider about how often you should have regular mammograms. ?Talk with your health care provider about your test results, treatment options, and if necessary, the need for more tests. ?Vaccines  ?Your health care provider may recommend certain vaccines, such as: ?Influenza vaccine. This is recommended every year. ?  Tetanus, diphtheria, and acellular pertussis (Tdap, Td) vaccine. You may need a Td booster every 10 years. ?Zoster  vaccine. You may need this after age 84. ?Pneumococcal 13-valent conjugate (PCV13) vaccine. One dose is recommended after age 60. ?Pneumococcal polysaccharide (PPSV23) vaccine. One dose is recommended after age 84. ?Talk to your health care provider about which screenings and vaccines you need and how often you need them. ?This information is not intended to replace advice given to you by your health care provider. Make sure you discuss any questions you have with your health care provider. ?Document Released: 01/12/2016 Document Revised: 09/04/2016 Document Reviewed: 10/17/2015 ?Elsevier Interactive Patient Education ? 2017 [employer]. ? ?Fall Prevention in the Home ?Falls can cause injuries. They can happen to people of all ages. There are many things you can do to make your home safe and to help prevent falls. ?What can I do on the outside of my home? ?Regularly fix the edges of walkways and driveways and fix any cracks. ?Remove anything that might make you trip as you walk through a door, such as a raised step or threshold. ?Trim any bushes or trees on the path to your home. ?Use bright outdoor lighting. ?Clear any walking paths of anything that might make someone trip, such as rocks or tools. ?Regularly check to see if handrails are loose or broken. Make sure that both sides of any steps have handrails. ?Any raised decks and porches should have guardrails on the edges. ?Have any leaves, snow, or ice cleared regularly. ?Use sand or salt on walking paths during winter. ?Clean up any spills in your garage right away. This includes oil or grease spills. ?What can I do in the bathroom? ?Use night lights. ?Install grab bars by the toilet and in the tub and shower. Do not use towel bars as grab bars. ?Use non-skid mats or decals in the tub or shower. ?If you need to sit down in the shower, use a plastic, non-slip stool. ?Keep the floor dry. Clean up any water that spills on the floor as soon as it happens. ?Remove  soap buildup in the tub or shower regularly. ?Attach bath mats securely with double-sided non-slip rug tape. ?Do not have throw rugs and other things on the floor that can make you trip. ?What can I do in the bedroom? ?Use night lights. ?Make sure that you have a light by your bed that is easy to reach. ?Do not use any sheets or blankets that are too big for your bed. They should not hang down onto the floor. ?Have a firm chair that has side arms. You can use this for support while you get dressed. ?Do not have throw rugs and other things on the floor that can make you trip. ?What can I do in the kitchen? ?Clean up any spills right away. ?Avoid walking on wet floors. ?Keep items that you use a lot in easy-to-reach places. ?If you need to reach something above you, use a strong step stool that has a grab bar. ?Keep electrical cords out of the way. ?Do not use floor polish or wax that makes floors slippery. If you must use wax, use non-skid floor wax. ?Do not have throw rugs and other things on the floor that can make you trip. ?What can I do with my stairs? ?Do not leave any items on the stairs. ?Make sure that there are handrails on both sides of the stairs and use them. Fix handrails that are broken or loose.  Make sure that handrails are as long as the stairways. ?Check any carpeting to make sure that it is firmly attached to the stairs. Fix any carpet that is loose or worn. ?Avoid having throw rugs at the top or bottom of the stairs. If you do have throw rugs, attach them to the floor with carpet tape. ?Make sure that you have a light switch at the top of the stairs and the bottom of the stairs. If you do not have them, ask someone to add them for you. ?What else can I do to help prevent falls? ?Wear shoes that: ?Do not have high heels. ?Have rubber bottoms. ?Are comfortable and fit you well. ?Are closed at the toe. Do not wear sandals. ?If you use a stepladder: ?Make sure that it is fully opened. Do not climb a  closed stepladder. ?Make sure that both sides of the stepladder are locked into place. ?Ask someone to hold it for you, if possible. ?Clearly mark and make sure that you can see: ?Any grab bars or handrails. ?F

## 2022-05-09 NOTE — Progress Notes (Signed)
? ?Subjective:  ? Elizabeth Mata is a 61 y.o. female who presents for Medicare Annual (Subsequent) preventive examination. ? ?Virtual Visit via Telephone Note ? ?I connected with  Elizabeth Mata on 05/09/22 at  2:00 PM EDT by telephone and verified that I am speaking with the correct person using two identifiers. ? ?Location: ?Patient: home ?Provider: [place] ?Persons participating in the virtual visit: patient/Nurse Health Advisor ?  ?I discussed the limitations, risks, security and privacy concerns of performing an evaluation and management service by telephone and the availability of in person appointments. The patient expressed understanding and agreed to proceed. ? ?Interactive audio and video telecommunications were attempted between this nurse and patient, however failed, due to patient having technical difficulties OR patient did not have access to video capability.  We continued and completed visit with audio only. ? ?Some vital signs may be absent or patient reported.  ? ?Clemetine Marker, LPN ? ? ?Review of Systems    ? ?Cardiac Risk Factors include: advanced age (>59mn, >>14women);dyslipidemia ? ?   ?Objective:  ?  ?Today's Vitals  ? 05/09/22 1420  ?PainSc: 7   ? ?There is no height or weight on file to calculate BMI. ? ? ?  05/09/2022  ?  2:05 PM 05/08/2021  ?  2:26 PM 12/28/2020  ? 10:40 AM 04/28/2020  ? 11:30 AM 06/23/2019  ?  9:45 AM 02/12/2019  ? 11:51 AM 10/27/2018  ?  2:34 PM  ?Advanced Directives  ?Does Patient Have a Medical Advance Directive? No No No No No No No  ?Would patient like information on creating a medical advance directive? Yes (MAU/Ambulatory/Procedural Areas - Information given) Yes (MAU/Ambulatory/Procedural Areas - Information given) No - Patient declined No - Patient declined  Yes (MAU/Ambulatory/Procedural Areas - Information given)   ? ? ?Current Medications (verified) ?Outpatient Encounter Medications as of 05/09/2022  ?Medication Sig  ? aspirin EC 81 MG EC tablet Take 1 tablet (81  mg total) by mouth daily.  ? atorvastatin (LIPITOR) 80 MG tablet Take 1 tablet (80 mg total) by mouth daily.  ? diclofenac Sodium (VOLTAREN) 1 % GEL Apply 4 g topically 4 (four) times daily.  ? famotidine (PEPCID) 20 MG tablet Take 1 tablet (20 mg total) by mouth at bedtime.  ? fexofenadine (ALLEGRA) 180 MG tablet Take 180 mg by mouth daily.  ? loperamide (IMODIUM A-D) 2 MG tablet Take 1 tablet (2 mg total) by mouth as needed for diarrhea or loose stools.  ? meloxicam (MOBIC) 7.5 MG tablet Take 1 tablet (7.5 mg total) by mouth daily.  ? tacrolimus (PROTOPIC) 0.1 % ointment Apply to face 1-2 times daily as needed for itch.  ? topiramate (TOPAMAX) 200 MG tablet Take 200 mg by mouth 2 (two) times daily.   ? UBRELVY 100 MG TABS Take 1 tablet by mouth as needed.  ? gabapentin (NEURONTIN) 100 MG capsule Take 300 mg by mouth at bedtime. (Patient not taking: Reported on 05/09/2022)  ? omeprazole (PRILOSEC) 20 MG capsule Take 1 capsule (20 mg total) by mouth daily. (Patient not taking: Reported on 05/09/2022)  ? [DISCONTINUED] loratadine (CLARITIN) 10 MG tablet Take 10 mg by mouth daily.  ? [DISCONTINUED] triamcinolone (NASACORT) 55 MCG/ACT AERO nasal inhaler Place 2 sprays into the nose daily.  ? ?No facility-administered encounter medications on file as of 05/09/2022.  ? ? ?Allergies (verified) ?Clindamycin/lincomycin, Hydrocodone, Metronidazole, Sulfa antibiotics, and Oxycodone  ? ?History: ?Past Medical History:  ?Diagnosis Date  ? Allergic rhinitis 06/30/2016  ?  Allergy   ? Carotid atherosclerosis, bilateral 11/06/2017  ? [place] Nov 2018  ? Cyst of breast, left, solitary   ? Flank pain   ? GERD (gastroesophageal reflux disease)   ? Hemorrhoid   ? Hyperlipidemia   ? IFG (impaired fasting glucose) 07/18/2016  ? June 2017   ? Kidney stones   ? Neuropathy 07/24/2016  ? Seizures ([place])   ? Shingles outbreak 02/03/2017  ? Syncope   ? Recurrent over the last 2 years. had workup at [facility] this year with cardiology and neurology. Had echo and  3-week event monitor that were unremarkable. Has had negative EEG. Echo in 11/11 at [facility] showed no significant abnormalities. Possibly vasovagal  ? Trigger thumb   ? ?Past Surgical History:  ?Procedure Laterality Date  ? ABDOMINAL HYSTERECTOMY  40s  ? endometriosis, fibroid tumors; no cancer, partial  ? BREAST EXCISIONAL BIOPSY Left 1980'S  ? EXCISIONAL - NEG  ? BREAST SURGERY    ? CHOLECYSTECTOMY  31  ? COLONOSCOPY WITH PROPOFOL N/A 06/23/2019  ? Procedure: COLONOSCOPY WITH PROPOFOL;  Surgeon: Virgel Manifold, MD;  Location: [facility] ENDOSCOPY;  Service: Endoscopy;  Laterality: N/A;  ? COLONOSCOPY WITH PROPOFOL N/A 12/28/2020  ? Procedure: COLONOSCOPY WITH PROPOFOL;  Surgeon: Virgel Manifold, MD;  Location: [facility] ENDOSCOPY;  Service: Endoscopy;  Laterality: N/A;  ? ESOPHAGOGASTRODUODENOSCOPY (EGD) WITH PROPOFOL N/A 06/23/2019  ? Procedure: ESOPHAGOGASTRODUODENOSCOPY (EGD) WITH PROPOFOL;  Surgeon: Virgel Manifold, MD;  Location: [facility] ENDOSCOPY;  Service: Endoscopy;  Laterality: N/A;  ? ESOPHAGOGASTRODUODENOSCOPY (EGD) WITH PROPOFOL N/A 12/28/2020  ? Procedure: ESOPHAGOGASTRODUODENOSCOPY (EGD) WITH PROPOFOL;  Surgeon: Virgel Manifold, MD;  Location: [facility] ENDOSCOPY;  Service: Endoscopy;  Laterality: N/A;  ? FOOT SURGERY Right   ? tendon release  ? ?Family History  ?Problem Relation Age of Onset  ? Coronary artery disease Maternal Uncle   ? Diabetes Mellitus II Mother   ? Arthritis Mother   ? Diabetes Maternal Uncle   ? Kidney cancer Father 12  ?     died 05/16/17  ? Kidney disease Father   ? Cancer Father   ?     kidney and bladder  ? Arthritis Maternal Uncle   ? Cancer Maternal Grandmother   ?     stomach?  ? Jaundice Maternal Grandmother   ? Stomach cancer Paternal Grandmother   ? Cancer Paternal Grandmother   ?     stomach  ? Hypertension Sister   ? Stroke Maternal Grandfather   ? Diabetes Paternal Grandfather   ? Diabetes Sister   ? Hypertension Sister   ? Breast cancer Neg Hx   ? Bladder Cancer  Neg Hx   ? ?Social History  ? ?Socioeconomic History  ? Marital status: Legally Separated  ?  Spouse name: Not on file  ? Number of children: 2  ? Years of education: Not on file  ? Highest education level: GED or equivalent  ?Occupational History  ? Occupation: unemployed  ?Tobacco Use  ? Smoking status: Never  ? Smokeless tobacco: Never  ?Vaping Use  ? Vaping Use: Never used  ?Substance and Sexual Activity  ? Alcohol use: Not Currently  ?  Alcohol/week: 0.0 standard drinks  ? Drug use: Never  ? Sexual activity: Yes  ?  Partners: Male  ?  Birth control/protection: Surgical  ?  Comment: Hysterectomy  ?Other Topics Concern  ? Not on file  ?Social History Narrative  ? Regular exercise  ? ?Social Determinants of Health  ? ?  Financial Resource Strain: Low Risk   ? Difficulty of Paying Living Expenses: Not very hard  ?Food Insecurity: No Food Insecurity  ? Worried About Charity fundraiser in the Last Year: Never true  ? Ran Out of Food in the Last Year: Never true  ?Transportation Needs: No Transportation Needs  ? Lack of Transportation (Medical): No  ? Lack of Transportation (Non-Medical): No  ?Physical Activity: Sufficiently Active  ? Days of Exercise per Week: 7 days  ? Minutes of Exercise per Session: 30 min  ?Stress: No Stress Concern Present  ? Feeling of Stress : Not at all  ?Social Connections: Moderately Isolated  ? Frequency of Communication with Friends and Family: More than three times a week  ? Frequency of Social Gatherings with Friends and Family: Three times a week  ? Attends Religious Services: More than 4 times per year  ? Active Member of Clubs or Organizations: No  ? Attends Archivist Meetings: Never  ? Marital Status: Separated  ? ? ?Tobacco Counseling ?Counseling given: Not Answered ? ? ?Clinical Intake: ? ?Pre-visit preparation completed: Yes ? ?Pain : 0-10 ?Pain Score: 7  ?Pain Type: Chronic pain ?Pain Location: Back ?Pain Orientation: Lower ?Pain Descriptors / Indicators: Aching,  Sore ?Pain Onset: More than a month ago ?Pain Frequency: Intermittent ? ?  ? ?Nutritional Risks: None ?Diabetes: No ? ?How often do you need to have someone help you when you read instructions, pamphlets,

## 2022-05-09 NOTE — Patient Instructions (Signed)

## 2022-06-24 ENCOUNTER — Ambulatory Visit: Payer: Medicare HMO | Admitting: Dermatology

## 2022-07-27 ENCOUNTER — Telehealth: Payer: Self-pay | Admitting: Family Medicine

## 2022-07-27 DIAGNOSIS — I6523 Occlusion and stenosis of bilateral carotid arteries: Secondary | ICD-10-CM

## 2022-07-27 DIAGNOSIS — E782 Mixed hyperlipidemia: Secondary | ICD-10-CM

## 2022-07-29 NOTE — Telephone Encounter (Signed)
Pt needs reg f/u

## 2022-07-29 NOTE — Telephone Encounter (Signed)
Lvm for pt to call the office to schedule an appt  °

## 2022-08-26 ENCOUNTER — Ambulatory Visit (INDEPENDENT_AMBULATORY_CARE_PROVIDER_SITE_OTHER): Payer: Medicare HMO | Admitting: Gastroenterology

## 2022-08-26 ENCOUNTER — Encounter: Payer: Self-pay | Admitting: Gastroenterology

## 2022-08-26 VITALS — BP 109/74 | HR 79 | Temp 97.8°F | Wt 136.0 lb

## 2022-08-26 DIAGNOSIS — G8929 Other chronic pain: Secondary | ICD-10-CM | POA: Diagnosis not present

## 2022-08-26 DIAGNOSIS — R1013 Epigastric pain: Secondary | ICD-10-CM

## 2022-08-26 DIAGNOSIS — R197 Diarrhea, unspecified: Secondary | ICD-10-CM | POA: Diagnosis not present

## 2022-08-26 MED ORDER — CHOLESTYRAMINE 4 G PO PACK: 4.0000 g | PACK | Freq: Two times a day (BID) | ORAL | 3 refills | Status: DC

## 2022-08-26 MED ORDER — OMEPRAZOLE 40 MG PO CPDR: 40.0000 mg | DELAYED_RELEASE_CAPSULE | Freq: Two times a day (BID) | ORAL | 3 refills | Status: DC

## 2022-08-26 NOTE — Progress Notes (Signed)
Jonathon Bellows MD, MRCP(U.K) [address]  [place]  [place], [place] [address]  Main: [phone]  Fax: [phone]   Primary Care Physician: Delsa Grana, PA-C  Primary Gastroenterologist:  Dr. Jonathon Bellows   Chief Complaint  Patient presents with   Abdominal Pain    HPI: Elizabeth Mata is a 61 y.o. female    Summary of history :  Previously seen by Dr Bonna Gains last in 07/2021 for heartburn,loose stools and weight gain. They were stable at last office visit .   11/2020: EGD: Reactive gastropathy . Colonoscopy no evidence of microscopic colitis.    Interval history  07/2021-08/26/2022 Since her last visit she states she has developed epigastric pain ongoing at least for a year if not longer.  Dull in nature usually worse after she eats.  Not relieved with a bowel movement can last hours.  Nonradiating.  Denies any constipation to having diarrhea for few years.  Has a bowel movement right after she eats.  Has been taking meloxicam for years.  Stopped taking it a few days back.  Not on any PPI.  Denies any other NSAIDs.  Denies any weight loss.  No recent labs for a year   Current Outpatient Medications  Medication Sig Dispense Refill   aspirin EC 81 MG EC tablet Take 1 tablet (81 mg total) by mouth daily. 30 tablet 1   atorvastatin (LIPITOR) 80 MG tablet TAKE 1 TABLET BY MOUTH EVERY DAY 90 tablet 0   diclofenac Sodium (VOLTAREN) 1 % GEL Apply 4 g topically 4 (four) times daily. 100 g 1   famotidine (PEPCID) 20 MG tablet Take 1 tablet (20 mg total) by mouth at bedtime. 90 tablet 0   fexofenadine (ALLEGRA) 180 MG tablet Take 180 mg by mouth daily.     gabapentin (NEURONTIN) 100 MG capsule Take 300 mg by mouth at bedtime.     loperamide (IMODIUM A-D) 2 MG tablet Take 1 tablet (2 mg total) by mouth as needed for diarrhea or loose stools. 30 tablet 0   meloxicam (MOBIC) 7.5 MG tablet Take 1 tablet (7.5 mg total) by mouth daily. 30 tablet 0   omeprazole (PRILOSEC) 20 MG  capsule Take 1 capsule (20 mg total) by mouth daily. 30 capsule 0   tacrolimus (PROTOPIC) 0.1 % ointment Apply to face 1-2 times daily as needed for itch. 30 g 1   topiramate (TOPAMAX) 200 MG tablet Take 200 mg by mouth 2 (two) times daily.   11   UBRELVY 100 MG TABS Take 1 tablet by mouth as needed.     No current facility-administered medications for this visit.    Allergies as of 08/26/2022 - Review Complete 08/26/2022  Allergen Reaction Noted   Clindamycin/lincomycin Hives 11/10/2015   Hydrocodone Nausea Only 03/01/2015   Metronidazole Hives 11/07/2010   Sulfa antibiotics Hives 07/05/2014   Oxycodone Nausea And Vomiting 02/05/2017    ROS:  General: Negative for anorexia, weight loss, fever, chills, fatigue, weakness. ENT: Negative for hoarseness, difficulty swallowing , nasal congestion. CV: Negative for chest pain, angina, palpitations, dyspnea on exertion, peripheral edema.  Respiratory: Negative for dyspnea at rest, dyspnea on exertion, cough, sputum, wheezing.  GI: See history of present illness. GU:  Negative for dysuria, hematuria, urinary incontinence, urinary frequency, nocturnal urination.  Endo: Negative for unusual weight change.    Physical Examination:   BP 109/74   Pulse 79   Temp 97.8 F (36.6 C) (Oral)   Wt 136 lb (61.7  kg)   BMI 27.47 kg/m   General: Well-nourished, well-developed in no acute distress.  Eyes: No icterus. Conjunctivae pink. Abdomen: Bowel sounds are normal, nontender, nondistended, no hepatosplenomegaly or masses, no abdominal bruits or hernia , no rebound or guarding.  . Neuro: Alert and oriented x 3.  Grossly intact. Skin: Warm and dry, no jaundice.   Psych: Alert and cooperative, normal mood and affect.   Imaging Studies: No results found.  Assessment and Plan:   Elizabeth Mata is a 61 y.o. y/o female here to see me today for abdominal pain if she is very suggestive of a gastric ulcer.  Likely secondary to long-term NSAID  use.  She also has chronic diarrhea and history of cholecystectomy differentials include bile salt mediated diarrhea.  Plan 1.  H. pylori breath test, trial of PPI.  Stop all NSAID use.  If no better at next visit we will have to consider endoscopy/imaging 2.  Chronic diarrhea likely bile salt mediated: Stool studies and commence on Questran 1 to 2 packets a day as needed.    Dr Jonathon Bellows  MD,MRCP [facility]) Follow up in 6 to 8 weeks

## 2022-08-27 LAB — H. PYLORI BREATH TEST: H pylori Breath Test: NEGATIVE

## 2022-09-05 DIAGNOSIS — R197 Diarrhea, unspecified: Secondary | ICD-10-CM | POA: Diagnosis not present

## 2022-09-10 LAB — STOOL CULTURE: E coli, Shiga toxin Assay: NEGATIVE

## 2022-09-10 LAB — SPECIMEN STATUS REPORT

## 2022-09-10 LAB — C DIFFICILE, CYTOTOXIN B

## 2022-09-10 LAB — C DIFFICILE TOXINS A+B W/RFLX: C difficile Toxins A+B, EIA: NEGATIVE

## 2022-09-11 NOTE — Progress Notes (Signed)
Can we find out why GI PCR not done?

## 2022-09-12 ENCOUNTER — Other Ambulatory Visit: Payer: Self-pay | Admitting: Family Medicine

## 2022-09-16 ENCOUNTER — Encounter: Payer: Self-pay | Admitting: Family Medicine

## 2022-09-16 ENCOUNTER — Ambulatory Visit (INDEPENDENT_AMBULATORY_CARE_PROVIDER_SITE_OTHER): Payer: Medicare HMO | Admitting: Family Medicine

## 2022-09-16 VITALS — BP 118/74 | HR 83 | Temp 98.0°F | Resp 16 | Ht 59.0 in | Wt 138.0 lb

## 2022-09-16 DIAGNOSIS — N644 Mastodynia: Secondary | ICD-10-CM | POA: Diagnosis not present

## 2022-09-16 DIAGNOSIS — G43109 Migraine with aura, not intractable, without status migrainosus: Secondary | ICD-10-CM | POA: Diagnosis not present

## 2022-09-16 DIAGNOSIS — G459 Transient cerebral ischemic attack, unspecified: Secondary | ICD-10-CM | POA: Diagnosis not present

## 2022-09-16 DIAGNOSIS — Z5181 Encounter for therapeutic drug level monitoring: Secondary | ICD-10-CM

## 2022-09-16 DIAGNOSIS — R569 Unspecified convulsions: Secondary | ICD-10-CM | POA: Diagnosis not present

## 2022-09-16 DIAGNOSIS — I6523 Occlusion and stenosis of bilateral carotid arteries: Secondary | ICD-10-CM

## 2022-09-16 DIAGNOSIS — Z8673 Personal history of transient ischemic attack (TIA), and cerebral infarction without residual deficits: Secondary | ICD-10-CM

## 2022-09-16 DIAGNOSIS — E782 Mixed hyperlipidemia: Secondary | ICD-10-CM | POA: Insufficient documentation

## 2022-09-16 NOTE — Assessment & Plan Note (Signed)
Per neurologist at [facility] - care everywhere [place] reviewed most recently 04/2021 and then Dr. Domenick Gong 04/08/2022 - Patient does have a history of TIA; with MRI and MRA of the brain showed remote small infarcts in the right thalamus and basal ganglia and patent vasculature. Patient denies any TIA like episodes in the last year. Last LDL was 97.  Last MRA/MRI 2022 Unclear if there is any need for routine carotid duplex follow up On statin and ASA

## 2022-09-16 NOTE — Progress Notes (Signed)
Patient ID: Elizabeth Mata, female    DOB: [birth year], 61 y.o.   MRN: [id number]  PCP: Delsa Grana, PA-C  Chief Complaint  Patient presents with   Breast Pain    Left breast onset for a month. Pt states has had a lump taken before from same breast.    Subjective:   Elizabeth Mata is a 61 y.o. female, presents to clinic with CC of the following:  HPI  Here for breast pain to left breast, she is here for eval and imaging Hx of lumpectomy to same  Last Mammo was Sept 2022 and normal Onset of pain a month ago, just sore and achy to left breast and sometimes nipple w/o swelling, skin changes, nipple discharge Pain exacerbated with palpation (even light touch) no alleviating factors, she thought she would wait, but its been about a month and no change   Needs refill on cholesterol meds, hx of stroke/TIA and other atherosclerotic disease, she is due for f/up visit HLD on lipitor 80 and ASA 81 mg Lab Results  Component Value Date   CHOL 193 08/06/2021   HDL 79 08/06/2021   LDLCALC 97 08/06/2021   TRIG 78 08/06/2021   CHOLHDL 2.4 08/06/2021   Hx of prediabetes Lab Results  Component Value Date   HGBA1C 5.6 08/06/2021       Patient Active Problem List   Diagnosis Date Noted   BV (bacterial vaginosis) 05/23/2021   Pelvic floor weakness in female 03/03/2020   LLQ pain 03/03/2020   History of colonic polyps    Polyp of ascending colon    Hemorrhoids    Stomach irritation    Abdominal pain, epigastric    Dizziness 10/27/2018   Palpitations 10/27/2018   Urinary urgency 04/21/2018   Menopausal symptom 04/21/2018   Urticaria 01/05/2018   Carotid atherosclerosis, bilateral 11/06/2017   TIA (transient ischemic attack) 10/28/2017   Hx of completed stroke 10/28/2017   Ataxia 10/08/2017   Breast pain in female 07/11/2017   Medication monitoring encounter 12/20/2016   Abnormality of globulin 07/26/2016   Neuropathy 07/24/2016   Skin pigmentation disorder 07/24/2016    IFG (impaired fasting glucose) 07/18/2016   Hematuria 07/03/2016   Allergic rhinitis 06/30/2016   Chronic left hip pain 06/28/2016   Breast cancer screening 04/17/2016   Skin texture changes 12/27/2015   Vaginal discharge 10/31/2015   Screening for STD (sexually transmitted disease) 10/31/2015   Kidney stone on left side 10/24/2015   Annual physical exam 09/13/2015   Encounter for screening mammogram for malignant neoplasm of breast 09/13/2015   Need for immunization against influenza 09/13/2015   Eczema 09/13/2015   Irritable bowel syndrome with constipation 09/13/2015   Seizures ([place]) 08/16/2015   Carpal tunnel syndrome 07/26/2014   Carotid artery narrowing 07/05/2013   Basilar artery migraine 07/06/2012      Current Outpatient Medications:    aspirin EC 81 MG EC tablet, Take 1 tablet (81 mg total) by mouth daily., Disp: 30 tablet, Rfl: 1   atorvastatin (LIPITOR) 80 MG tablet, TAKE 1 TABLET BY MOUTH EVERY DAY, Disp: 90 tablet, Rfl: 0   cholestyramine (QUESTRAN) 4 g packet, Take 1 packet (4 g total) by mouth 2 (two) times daily. For loose stools., Disp: 60 each, Rfl: 3   diclofenac Sodium (VOLTAREN) 1 % GEL, Apply 4 g topically 4 (four) times daily., Disp: 100 g, Rfl: 1   famotidine (PEPCID) 20 MG tablet, Take 1 tablet (20 mg total) by mouth at bedtime.,  Disp: 90 tablet, Rfl: 0   fexofenadine (ALLEGRA) 180 MG tablet, Take 180 mg by mouth daily., Disp: , Rfl:    gabapentin (NEURONTIN) 100 MG capsule, Take 300 mg by mouth at bedtime., Disp: , Rfl:    loperamide (IMODIUM A-D) 2 MG tablet, Take 1 tablet (2 mg total) by mouth as needed for diarrhea or loose stools., Disp: 30 tablet, Rfl: 0   meloxicam (MOBIC) 7.5 MG tablet, Take 1 tablet (7.5 mg total) by mouth daily., Disp: 30 tablet, Rfl: 0   omeprazole (PRILOSEC) 40 MG capsule, Take 1 capsule (40 mg total) by mouth in the morning and at bedtime., Disp: 180 capsule, Rfl: 3   tacrolimus (PROTOPIC) 0.1 % ointment, Apply to face 1-2  times daily as needed for itch., Disp: 30 g, Rfl: 1   topiramate (TOPAMAX) 200 MG tablet, Take 200 mg by mouth 2 (two) times daily. , Disp: , Rfl: 11   UBRELVY 100 MG TABS, Take 1 tablet by mouth as needed., Disp: , Rfl:    Allergies  Allergen Reactions   Clindamycin/Lincomycin Hives   Hydrocodone Nausea Only   Metronidazole Hives   Sulfa Antibiotics Hives    Break out in hives all over   Oxycodone Nausea And Vomiting     Social History   Tobacco Use   Smoking status: Never   Smokeless tobacco: Never  Vaping Use   Vaping Use: Never used  Substance Use Topics   Alcohol use: Not Currently    Alcohol/week: 0.0 standard drinks of alcohol   Drug use: Never      Chart Review Today: I personally reviewed active problem list, medication list, allergies, family history, social history, health maintenance, notes from last encounter, lab results, imaging with the patient/caregiver today.   Review of Systems  Constitutional: Negative.   HENT: Negative.    Eyes: Negative.   Respiratory: Negative.  Negative for chest tightness, shortness of breath and wheezing.   Cardiovascular: Negative.  Negative for chest pain, palpitations and leg swelling.  Gastrointestinal: Negative.   Endocrine: Negative.   Genitourinary: Negative.   Musculoskeletal: Negative.   Skin: Negative.   Allergic/Immunologic: Negative.   Neurological: Negative.   Hematological: Negative.   Psychiatric/Behavioral: Negative.    All other systems reviewed and are negative.      Objective:   Vitals:   09/16/22 1444  BP: 118/74  Pulse: 83  Resp: 16  Temp: 98 F (36.7 C)  TempSrc: Oral  SpO2: 97%  Weight: 138 lb (62.6 kg)  Height: '4\' 11"'$  (1.499 m)    Body mass index is 27.87 kg/m.  Physical Exam Vitals and nursing note reviewed.  Constitutional:      General: She is not in acute distress.    Appearance: Normal appearance. She is well-developed and normal weight. She is not ill-appearing,  toxic-appearing or diaphoretic.  HENT:     Head: Normocephalic and atraumatic.     Nose: Nose normal.  Eyes:     General:        Right eye: No discharge.        Left eye: No discharge.     Conjunctiva/sclera: Conjunctivae normal.  Neck:     Trachea: No tracheal deviation.  Cardiovascular:     Rate and Rhythm: Normal rate and regular rhythm.     Pulses: Normal pulses.     Heart sounds: No murmur heard.    Friction rub present. No gallop.  Pulmonary:     Effort: Pulmonary effort is normal.  No respiratory distress.     Breath sounds: Normal breath sounds. No stridor. No wheezing, rhonchi or rales.  Chest:     Chest wall: No mass, lacerations, deformity, swelling, tenderness or crepitus.  Breasts:    Breasts are asymmetrical (mild R>L).     Right: Normal.     Left: Tenderness present. No swelling, bleeding, inverted nipple, mass, nipple discharge or skin change.     Comments: Left breast 7 o'clock oblong area that was larger 2-3 cm x 1 cm, felt consistent with breast tissue but not connected to rest of breast/glandular tissue under areola  Generalized left breast ttp  Scar to left breast - normal appearing roughly 1.5 cm long - located about 10 o'clock  Musculoskeletal:        General: Normal range of motion.  Lymphadenopathy:     Upper Body:     Right upper body: No supraclavicular, axillary or pectoral adenopathy.     Left upper body: No supraclavicular, axillary or pectoral adenopathy.  Skin:    General: Skin is warm and dry.     Coloration: Skin is not jaundiced or pale.     Findings: No erythema or rash.  Neurological:     Mental Status: She is alert. Mental status is at baseline.     Motor: No abnormal muscle tone.     Coordination: Coordination normal.     Gait: Gait normal.  Psychiatric:        Mood and Affect: Mood normal.        Behavior: Behavior normal.      Results for orders placed or performed in visit on 08/26/22  C difficile Toxins A+B W/Rflx    Specimen: Stool   ST  Result Value Ref Range   C difficile Toxins A+B, EIA Negative Negative  Stool culture   ST  Result Value Ref Range   Salmonella/Shigella Screen Final report    Stool Culture result 1 (RSASHR) Comment    Campylobacter Culture Final report    Stool Culture result 1 (CMPCXR) Comment    E coli, Shiga toxin Assay Negative Negative  C difficile, Cytotoxin B   ST  Result Value Ref Range   C diff Toxin B Final report    Result 1 Comment   H. pylori breath test  Result Value Ref Range   H pylori Breath Test Negative Negative  Specimen status report  Result Value Ref Range   specimen status report Comment        Assessment & Plan:   1. Breast pain, left Generalized tenderness w/o any skin changes, palpated lymphadenopathy, possible abnormality located to around 7 o'clock - not more tender than anywhere else on left breast Will get diagnostic mammo - adjust orders per [place]/breast specialists - MM Digital Diagnostic Bilat; Future - US BREAST LTD UNI LEFT INC AXILLA; Future  2. Carotid atherosclerosis, bilateral On statin and ASA, reviewed last imaging -2018 -at that time less than 50% stenosis  She sees [facility]y [facility] at [facility]  [facility]RA neck [facility] per [facility] - normal  - COMPLETE METABOLIC PANEL WITH GFR - Lipid panel  3. Mixed hyperlipidemia On 80 mg atorvastatin Good compliance, no SE or concerns Lab Results  Component Value Date   CHOL 193 08/06/2021   HDL 79 08/06/2021   LDLCALC 97 08/06/2021   TRIG 78 08/06/2021   CHOLHDL 2.4 08/06/2021  Due for f/up lipids and then will send in refills Past LDL has been >120, most recently 87-101 May need  to change statin if LDL is high - COMPLETE METABOLIC PANEL WITH GFR - Lipid panel  4. Medication monitoring encounter Due for labs today - CBC with Differential/Platelet - COMPLETE METABOLIC PANEL WITH GFR - Lipid panel  5. Hx of completed stroke (per [facility] lac[facility] stroke) She managed with neurologist  at [facility] - care everywhere [place] reviewed most recently 04/2021 and then Dr. Domenick Gong 04/08/2022 - Patient does have a history of TIA; with MRI and MRA of the brain showed remote small infarcts in the right thalamus and basal ganglia and patent vasculature. Patient denies any TIA like episodes in the last year. Last LDL was 97.  Last MRA/MRI 2022  6. Seizures ([place]) remote - per [facility] questionable hx Per specialists on topimax   7.  Migraine: Bickerstaff's migraine on topiramate 200 mg BID and gabapentin 300 mg nightly for migraine, ubrelvy PRN   Return in about 6 months (around 03/17/2023) for Routine follow-up cholesterol.   Delsa Grana, PA-C 09/16/22 2:50 PM

## 2022-09-16 NOTE — Assessment & Plan Note (Signed)
Compliant with meds, no SE, no myalgias, fatigue or jaundice Due for lipids and recheck CMP Diet and exercise recommendations for HLD reviewed If LDL is increased consider change in statin or adding zetia

## 2022-09-16 NOTE — Patient Instructions (Signed)
[facility] at [facility] [address], [place],  [address] Scheduling phone #: [phone]  Call to schedule your imaging tests

## 2022-09-16 NOTE — Assessment & Plan Note (Signed)
remote - per [facility] questionable hx Per specialists on topiramate  Reviewed [facility] last OV No recent seizure activity

## 2022-09-16 NOTE — Assessment & Plan Note (Signed)
[place] Nov 2018 - monitored by [facility] neuro On statin and ASA MRA neck 2022 noted to be normal Defer imaging to specialist

## 2022-09-16 NOTE — Assessment & Plan Note (Signed)
Managed by [facility], reviewed in care everywhere on topiramate 200 mg BID and gabapentin 300 mg nightly for migraine, ubrelvy PRN

## 2022-09-17 ENCOUNTER — Telehealth: Payer: Self-pay

## 2022-09-17 LAB — COMPLETE METABOLIC PANEL WITH GFR
AG Ratio: 1.9 (calc) (ref 1.0–2.5)
ALT: 25 U/L (ref 6–29)
AST: 25 U/L (ref 10–35)
Albumin: 4.4 g/dL (ref 3.6–5.1)
Alkaline phosphatase (APISO): 74 U/L (ref 37–153)
BUN: 10 mg/dL (ref 7–25)
CO2: 26 mmol/L (ref 20–32)
Calcium: 10.7 mg/dL — ABNORMAL HIGH (ref 8.6–10.4)
Chloride: 113 mmol/L — ABNORMAL HIGH (ref 98–110)
Creat: 1.02 mg/dL (ref 0.50–1.05)
Globulin: 2.3 g/dL (calc) (ref 1.9–3.7)
Glucose, Bld: 106 mg/dL — ABNORMAL HIGH (ref 65–99)
Potassium: 4.6 mmol/L (ref 3.5–5.3)
Sodium: 146 mmol/L (ref 135–146)
Total Bilirubin: 0.5 mg/dL (ref 0.2–1.2)
Total Protein: 6.7 g/dL (ref 6.1–8.1)
eGFR: 63 mL/min/{1.73_m2} (ref >=60)

## 2022-09-17 LAB — LIPID PANEL
Cholesterol: 208 mg/dL — ABNORMAL HIGH (ref <200)
HDL: 91 mg/dL (ref >=50)
LDL Cholesterol (Calc): 103 mg/dL (calc) — ABNORMAL HIGH
Non-HDL Cholesterol (Calc): 117 mg/dL (calc) (ref <130)
Total CHOL/HDL Ratio: 2.3 (calc) (ref <5.0)
Triglycerides: 53 mg/dL (ref <150)

## 2022-09-17 LAB — CBC WITH DIFFERENTIAL/PLATELET
Absolute Monocytes: 377 cells/uL (ref 200–950)
Basophils Absolute: 10 cells/uL (ref 0–200)
Basophils Relative: 0.2 %
Eosinophils Absolute: 51 cells/uL (ref 15–500)
Eosinophils Relative: 1 %
HCT: 40.8 % (ref 35.0–45.0)
Hemoglobin: 13.9 g/dL (ref 11.7–15.5)
Lymphs Abs: 3086 cells/uL (ref 850–3900)
MCH: 31.4 pg (ref 27.0–33.0)
MCHC: 34.1 g/dL (ref 32.0–36.0)
MCV: 92.1 fL (ref 80.0–100.0)
MPV: 9.1 fL (ref 7.5–12.5)
Monocytes Relative: 7.4 %
Neutro Abs: 1576 cells/uL (ref 1500–7800)
Neutrophils Relative %: 30.9 %
Platelets: 302 10*3/uL (ref 140–400)
RBC: 4.43 10*6/uL (ref 3.80–5.10)
RDW: 12.3 % (ref 11.0–15.0)
Total Lymphocyte: 60.5 %
WBC: 5.1 10*3/uL (ref 3.8–10.8)

## 2022-09-17 NOTE — Telephone Encounter (Signed)
-----   Message from Jonathon Bellows, MD sent at 09/11/2022  9:59 AM EDT ----- Can we find out why GI PCR not done?

## 2022-09-17 NOTE — Telephone Encounter (Signed)
Called patient but had to leave her a message asking her if she will turn in her stool samples or if she was feeling better and not needing to turn it in any longer.

## 2022-09-23 ENCOUNTER — Other Ambulatory Visit: Payer: Self-pay | Admitting: Family Medicine

## 2022-09-23 DIAGNOSIS — E782 Mixed hyperlipidemia: Secondary | ICD-10-CM

## 2022-09-23 MED ORDER — EZETIMIBE 10 MG PO TABS: 10.0000 mg | ORAL_TABLET | Freq: Every day | ORAL | 3 refills | Status: DC

## 2022-09-24 ENCOUNTER — Telehealth: Payer: Self-pay | Admitting: Family Medicine

## 2022-09-24 DIAGNOSIS — E782 Mixed hyperlipidemia: Secondary | ICD-10-CM

## 2022-09-24 NOTE — Telephone Encounter (Unsigned)
Copied from [place] [phone]. Topic: Referral - Question >> Sep 24, 2022 11:38 AM Penni Bombard wrote: Reason for CRM: Pt called her insurance and ask them if they would cove her seeing nutritionist.  They will so she needs a referral to one.  CB@  [phone] >> Sep 24, 2022 11:47 AM Penni Bombard wrote: Insurance suggest Theodora Blow on [facility] but she does not have availability until Nov  Fax [phone]  They said if you could get someone sooner to please call [phone] Ms Scheerer.

## 2022-09-24 NOTE — Telephone Encounter (Signed)
Will you want her to do an appointment for this?

## 2022-09-25 DIAGNOSIS — Z833 Family history of diabetes mellitus: Secondary | ICD-10-CM | POA: Diagnosis not present

## 2022-09-25 DIAGNOSIS — Z885 Allergy status to narcotic agent status: Secondary | ICD-10-CM | POA: Diagnosis not present

## 2022-09-25 DIAGNOSIS — Z8673 Personal history of transient ischemic attack (TIA), and cerebral infarction without residual deficits: Secondary | ICD-10-CM | POA: Diagnosis not present

## 2022-09-25 DIAGNOSIS — G43909 Migraine, unspecified, not intractable, without status migrainosus: Secondary | ICD-10-CM | POA: Diagnosis not present

## 2022-09-25 DIAGNOSIS — Z809 Family history of malignant neoplasm, unspecified: Secondary | ICD-10-CM | POA: Diagnosis not present

## 2022-09-25 DIAGNOSIS — Z8249 Family history of ischemic heart disease and other diseases of the circulatory system: Secondary | ICD-10-CM | POA: Diagnosis not present

## 2022-09-25 DIAGNOSIS — Z882 Allergy status to sulfonamides status: Secondary | ICD-10-CM | POA: Diagnosis not present

## 2022-09-25 DIAGNOSIS — G250 Essential tremor: Secondary | ICD-10-CM | POA: Diagnosis not present

## 2022-09-25 DIAGNOSIS — Z7982 Long term (current) use of aspirin: Secondary | ICD-10-CM | POA: Diagnosis not present

## 2022-09-25 DIAGNOSIS — Z823 Family history of stroke: Secondary | ICD-10-CM | POA: Diagnosis not present

## 2022-09-25 DIAGNOSIS — K219 Gastro-esophageal reflux disease without esophagitis: Secondary | ICD-10-CM | POA: Diagnosis not present

## 2022-09-25 DIAGNOSIS — E785 Hyperlipidemia, unspecified: Secondary | ICD-10-CM | POA: Diagnosis not present

## 2022-09-25 NOTE — Telephone Encounter (Signed)
Due to her cholesterol labs she wants someone to guide her in what to eat to avoid her cholesterol getting higher.

## 2022-09-26 ENCOUNTER — Emergency Department
Admission: EM | Admit: 2022-09-26 | Discharge: 2022-09-26 | Disposition: A | Discharge status: Home / Self Care | Payer: Medicare HMO | Attending: Emergency Medicine | Admitting: Emergency Medicine

## 2022-09-26 ENCOUNTER — Emergency Department: Payer: Medicare HMO

## 2022-09-26 DIAGNOSIS — S5002XA Contusion of left elbow, initial encounter: Secondary | ICD-10-CM | POA: Insufficient documentation

## 2022-09-26 DIAGNOSIS — W208XXA Other cause of strike by thrown, projected or falling object, initial encounter: Secondary | ICD-10-CM | POA: Insufficient documentation

## 2022-09-26 DIAGNOSIS — S59902A Unspecified injury of left elbow, initial encounter: Secondary | ICD-10-CM | POA: Diagnosis not present

## 2022-09-26 DIAGNOSIS — M25522 Pain in left elbow: Secondary | ICD-10-CM | POA: Diagnosis not present

## 2022-09-26 MED ORDER — MELOXICAM 15 MG PO TABS: 15.0000 mg | ORAL_TABLET | Freq: Every day | ORAL | 0 refills | Status: DC

## 2022-09-26 NOTE — Telephone Encounter (Signed)
Called patient again and left her a voicemail to call me and let me know if she ws still having diarrhea and if she was going to turn in her stool samples but I had to leave her a detailed message on her answering machine.

## 2022-09-26 NOTE — Telephone Encounter (Signed)
I printed the hand out - please explain to her why I need to provide edjucation in addition to adjusting medicines and all that has to be done and documented so that if we do not get things improved that I can explain why we need the referral when I order it. I would like to check her cholesterol again in 3-4 months as previously discussed ( I think after adding zetia to her current statin) my other plan was to change to statin to crestor if LDL doesn't improve. I put in the referral for her - but I do not know if it will go through like insurance told her it would.   Delsa Grana, PA-C

## 2022-09-26 NOTE — ED Provider Notes (Signed)
[facility] Provider Note  Patient Contact: 8:13 PM (approximate)   History   Arm Injury (Left arm , reaching for case of water and another case fell on arm )   HPI  Elizabeth Mata is a 61 y.o. female who presents the emergency department for evaluation of a left elbow injury.  Patient was picking up a crate of water at [facility] when 2 other crates fell and landed on her elbow.  Patient has no open wounds.  She reports a lump along the anterior aspect of the elbow.  No overlying skin changes such as ecchymosis.  Patient with good range of motion preserved to the elbow.  No other injury or complaint.     Physical Exam   Triage Vital Signs: ED Triage Vitals  Enc Vitals Group     BP --      Pulse Rate 09/26/22 1843 76     Resp 09/26/22 1843 17     Temp 09/26/22 1843 98.2 F (36.8 C)     Temp Source 09/26/22 1843 Oral     SpO2 09/26/22 1843 98 %     Weight 09/26/22 1844 138 lb 14.2 oz (63 kg)     Height 09/26/22 1844 '4\' 11"'$  (1.499 m)     Head Circumference --      Peak Flow --      Pain Score 09/26/22 1844 5     Pain Loc --      Pain Edu? --      Excl. in [place]? --     Most recent vital signs: Vitals:   09/26/22 1843  Pulse: 76  Resp: 17  Temp: 98.2 F (36.8 C)  SpO2: 98%     General: Alert and in no acute distress.  Cardiovascular:  Good peripheral perfusion Respiratory: Normal respiratory effort without tachypnea or retractions. Lungs CTAB.  Musculoskeletal: Full range of motion to all extremities.  Visualization of the left elbow reveals no open wounds.  Good range of motion preserved.  No tenderness to the osseous structures of the elbow.  Patient has a small area of soft tissue Neurologic:  No gross focal neurologic deficits are appreciated.  Skin:   No rash noted Other:   ED Results / Procedures / Treatments   Labs (all labs ordered are listed, but only abnormal results are displayed) Labs Reviewed - No data to  display   EKG     RADIOLOGY  I personally viewed, evaluated, and interpreted these images as part of my medical decision making, as well as reviewing the written report by the radiologist.  ED Provider Interpretation: No acute traumatic findings on x-ray  DG Elbow Complete Left  Result Date: 09/26/2022 CLINICAL DATA:  Pain and injury EXAM: LEFT ELBOW - COMPLETE 3+ VIEW COMPARISON:  None Available. FINDINGS: There is no evidence of fracture, dislocation, or joint effusion. There is no evidence of arthropathy or other focal bone abnormality. Soft tissues are unremarkable. IMPRESSION: Negative. Electronically Signed   By: Donavan Foil M.D.   On: 09/26/2022 19:11    PROCEDURES:  Critical Care performed: No  Procedures   MEDICATIONS ORDERED IN ED: Medications - No data to display   IMPRESSION / MDM / [place] / ED COURSE  I reviewed the triage vital signs and the nursing notes.  Differential diagnosis includes, but is not limited to, contusion, fracture, hematoma  Patient's presentation is most consistent with acute presentation with potential threat to life or bodily function.   Patient's diagnosis is consistent with hematoma.  Patient presented to the ED complaining of left elbow pain after a case of water fell and hit her in the elbow.  X-ray reveals no acute osseous abnormality.  Full range of motion is preserved.  Patient has evidence of small hematoma to the anterior elbow.  Anti-inflammatory for symptom control.  Follow-up primary care as needed..  Patient is given ED precautions to return to the ED for any worsening or new symptoms.        FINAL CLINICAL IMPRESSION(S) / ED DIAGNOSES   Final diagnoses:  None     Rx / DC Orders   ED Discharge Orders     None        Note:  This document was prepared using Dragon voice recognition software and may include unintentional dictation errors.   Brynda Peon 09/26/22 2023    Carrie Mew, MD 09/27/22 [phone]

## 2022-09-26 NOTE — ED Triage Notes (Signed)
Left arm pain ,

## 2022-09-26 NOTE — Telephone Encounter (Signed)
Pt in a rude manner stated she wants her referral instead, she stated to me that if I knew Kristeen Miss was going to offer handouts she could have avoided calling her insurance to verify if they cover. I advised patient I did not know what Kristeen Miss was planning to do for her other than a referral.

## 2022-09-26 NOTE — Telephone Encounter (Signed)
Left detailed vm °

## 2022-09-30 ENCOUNTER — Ambulatory Visit
Admission: RE | Admit: 2022-09-30 | Discharge: 2022-09-30 | Disposition: A | Discharge status: Home / Self Care | Payer: Medicare HMO | Source: Ambulatory Visit | Attending: Family Medicine | Admitting: Family Medicine

## 2022-09-30 DIAGNOSIS — N644 Mastodynia: Secondary | ICD-10-CM | POA: Diagnosis not present

## 2022-10-28 ENCOUNTER — Encounter: Payer: Self-pay | Admitting: Gastroenterology

## 2022-10-28 ENCOUNTER — Ambulatory Visit (INDEPENDENT_AMBULATORY_CARE_PROVIDER_SITE_OTHER): Payer: Medicare HMO | Admitting: Gastroenterology

## 2022-10-28 VITALS — BP 125/74 | HR 80 | Ht 59.0 in | Wt 137.4 lb

## 2022-10-28 DIAGNOSIS — R1013 Epigastric pain: Secondary | ICD-10-CM

## 2022-10-28 DIAGNOSIS — G8929 Other chronic pain: Secondary | ICD-10-CM | POA: Diagnosis not present

## 2022-10-28 MED ORDER — DICYCLOMINE HCL 10 MG PO CAPS: 10.0000 mg | ORAL_CAPSULE | Freq: Four times a day (QID) | ORAL | 2 refills | Status: DC

## 2022-10-28 NOTE — Progress Notes (Signed)
Jonathon Bellows MD, MRCP(U.K) [address]  [place]  [place], [place] [address]  Main: [phone]  Fax: [phone]   Primary Care Physician: Delsa Grana, PA-C  Primary Gastroenterologist:  Dr. Jonathon Bellows   Chief Complaint  Patient presents with   Follow-up    HPI: Elizabeth Mata is a 61 y.o. female   Previously seen by Dr Bonna Gains last in 07/2021 for heartburn,loose stools and weight gain. They were stable at last office visit . Transferred care to me on 08/26/2022 . At that time stated she developed epigastric pain ongoing at least for a year if not longer.  Dull in nature usually worse after she eats.  Not relieved with a bowel movement can last hours.  Nonradiating.  Denies any constipation to having diarrhea for few years.  Has a bowel movement right after she eats.  Has been taking meloxicam for years.  Stopped taking it a few days prior .  Not on any PPI.  Denies any other NSAIDs.  Denies any weight loss.She was also complaining of chronic diarrhea likely bile salt mediated/.    11/2020: EGD: Reactive gastropathy . Colonoscopy no evidence of microscopic colitis.      Interval history 08/26/2022-10/28/2022  08/26/2022:H pylori breath test negative.  08/2022: Stool negative for c diff but GI PCR not performed by lab for unknown reason.   Since last visit no significant diarrhea.  Continues to have epigastric pain.  Colonoscopy reviewed some gas and bloating.  Denies any NSAID use. Taking her PPI.  Stopped taking meloxicam.    Current Outpatient Medications  Medication Sig Dispense Refill   aspirin EC 81 MG EC tablet Take 1 tablet (81 mg total) by mouth daily. 30 tablet 1   atorvastatin (LIPITOR) 80 MG tablet TAKE 1 TABLET BY MOUTH EVERY DAY 90 tablet 0   cholestyramine (QUESTRAN) 4 g packet Take 1 packet (4 g total) by mouth 2 (two) times daily. For loose stools. 60 each 3   ezetimibe (ZETIA) 10 MG tablet Take 1 tablet (10 mg total) by mouth daily. 90 tablet 3    famotidine (PEPCID) 20 MG tablet Take 1 tablet (20 mg total) by mouth at bedtime. 90 tablet 0   fexofenadine (ALLEGRA) 180 MG tablet Take 180 mg by mouth daily.     gabapentin (NEURONTIN) 100 MG capsule Take 300 mg by mouth at bedtime.     loperamide (IMODIUM A-D) 2 MG tablet Take 1 tablet (2 mg total) by mouth as needed for diarrhea or loose stools. 30 tablet 0   meloxicam (MOBIC) 15 MG tablet Take 1 tablet (15 mg total) by mouth daily. 30 tablet 0   omeprazole (PRILOSEC) 40 MG capsule Take 1 capsule (40 mg total) by mouth in the morning and at bedtime. 180 capsule 3   Rimegepant Sulfate (NURTEC) 75 MG TBDP Take by mouth.     topiramate (TOPAMAX) 200 MG tablet Take 200 mg by mouth 2 (two) times daily.   11   UBRELVY 100 MG TABS Take 1 tablet by mouth as needed.     FLUZONE QUADRIVALENT 0.5 ML injection      SHINGRIX injection      tacrolimus (PROTOPIC) 0.1 % ointment Apply to face 1-2 times daily as needed for itch. (Patient not taking: Reported on 10/28/2022) 30 g 1   No current facility-administered medications for this visit.    Allergies as of 10/28/2022 - Review Complete 10/28/2022  Allergen Reaction Noted   Clindamycin/lincomycin Hives 11/10/2015  Hydrocodone Nausea Only 03/01/2015   Metronidazole Hives 11/07/2010   Sulfa antibiotics Hives 07/05/2014   Oxycodone Nausea And Vomiting 02/05/2017    ROS:  General: Negative for anorexia, weight loss, fever, chills, fatigue, weakness. ENT: Negative for hoarseness, difficulty swallowing , nasal congestion. CV: Negative for chest pain, angina, palpitations, dyspnea on exertion, peripheral edema.  Respiratory: Negative for dyspnea at rest, dyspnea on exertion, cough, sputum, wheezing.  GI: See history of present illness. GU:  Negative for dysuria, hematuria, urinary incontinence, urinary frequency, nocturnal urination.  Endo: Negative for unusual weight change.    Physical Examination:   BP 125/74 (BP Location: Right Arm, Patient  Position: Sitting, Cuff Size: Normal)   Pulse 80   Ht '4\' 11"'$  (1.499 m)   Wt 137 lb 6.4 oz (62.3 kg)   BMI 27.75 kg/m   General: Well-nourished, well-developed in no acute distress.  Eyes: No icterus. Conjunctivae pink. Mouth: Oropharyngeal mucosa moist and pink , no lesions erythema or exudate. Abdomen: Tenderness in the epigastric area no guarding or rigidity bowel sounds present Extremities: No lower extremity edema. No clubbing or deformities. Neuro: Alert and oriented x 3.  Grossly intact. Skin: Warm and dry, no jaundice.   Psych: Alert and cooperative, normal mood and affect.   Imaging Studies: MM DIAG BREAST TOMO BILATERAL  Result Date: 09/30/2022 CLINICAL DATA:  Nonfocal LEFT breast pain. Waxing and waning since August EXAM: DIGITAL DIAGNOSTIC BILATERAL MAMMOGRAM WITH TOMOSYNTHESIS TECHNIQUE: Bilateral digital diagnostic mammography and breast tomosynthesis was performed. COMPARISON:  Previous exam(s). ACR Breast Density Category c: The breast tissue is heterogeneously dense, which may obscure small masses. FINDINGS: Diagnostic mammographic images were obtained over the area of pain in the LEFT breast. No suspicious mammographic finding is identified in this area. No suspicious mass, microcalcification, or other finding is identified in either breast. No suspicious mammographic etiology for breast pain identified. IMPRESSION: 1. No suspicious mammographic etiology for nonfocal LEFT breast pain is identified. Any further workup of the patient's symptoms should be based on the clinical assessment. Recommend routine annual screening mammogram in 1 year. 2. No mammographic evidence of malignancy bilaterally. 3. Breast pain is a common condition, which will often resolve on its own without intervention. It can be affected by hormonal changes, medication side effect, weight changes and fit of the bra. Pain may also be referred from other adjacent areas of the body. Breast pain may be improved by  wearing adequate well-fitting support, over-the-counter topical and oral NSAID medication, low-fat diet, and ice/heat as needed. Studies have shown an improvement in cyclic pain with use of evening primrose oil and vitamin E. Clinical follow-up recommended to discuss any further work-up recommendations and appropriate treatment. RECOMMENDATION: Screening mammogram in one year.(Code:SM-B-01Y) I have discussed the findings and recommendations with the patient. If applicable, a reminder letter will be sent to the patient regarding the next appointment. BI-RADS CATEGORY  1: Negative. Electronically Signed   By: Valentino Saxon M.D.   On: 09/30/2022 10:44    Assessment and Plan:   Elizabeth Mata is a 61 y.o. y/o femalehere to follow up for abdominal pain.  History of long-term use of meloxicam.  She has since stopped after her last visit and has been on a PPI despite which continues to have epigastric discomfort on and off.  She had bile salt mediated diarrhea which has resolved with Questran.   Plan 1.  EGD to evaluate abdominal pain and CT scan of the abdomen. 2.  Bentyl 10 mg 4  times daily as needed for pain.   I have discussed alternative options, risks & benefits,  which include, but are not limited to, bleeding, infection, perforation,respiratory complication & drug reaction.  The patient agrees with this plan & written consent will be obtained.      Dr Jonathon Bellows  MD,MRCP [facility]) Follow up in 2 months

## 2022-10-28 NOTE — Patient Instructions (Signed)
CT Abdomen Pelvis W Contrast on 11/07/2022 @ 1:30 pm  [phone]

## 2022-10-29 ENCOUNTER — Telehealth: Payer: Self-pay | Admitting: *Deleted

## 2022-10-29 NOTE — Telephone Encounter (Signed)
Patient called office to reschedule her EGD which was schedule on 12/25/2022. We have change it to 12/26/2022 so her daughter can bring to the procedure.  Called [facility] endo unit to make the change. New instructions sent with new date.  Patient verbalized understanding.

## 2022-11-07 ENCOUNTER — Ambulatory Visit
Admission: RE | Admit: 2022-11-07 | Discharge: 2022-11-07 | Disposition: A | Discharge status: Home / Self Care | Payer: Medicare HMO | Source: Ambulatory Visit | Attending: Gastroenterology | Admitting: Gastroenterology

## 2022-11-07 DIAGNOSIS — R1013 Epigastric pain: Secondary | ICD-10-CM | POA: Insufficient documentation

## 2022-11-07 DIAGNOSIS — R109 Unspecified abdominal pain: Secondary | ICD-10-CM | POA: Diagnosis not present

## 2022-11-07 DIAGNOSIS — G8929 Other chronic pain: Secondary | ICD-10-CM | POA: Insufficient documentation

## 2022-11-07 MED ORDER — IOHEXOL 300 MG/ML  SOLN
100.0000 mL | Freq: Once | INTRAMUSCULAR | Status: AC | PRN
  Administered 2022-11-07: 100 mL via INTRAVENOUS

## 2022-11-11 ENCOUNTER — Telehealth: Payer: Self-pay

## 2022-11-11 NOTE — Telephone Encounter (Signed)
Called patient to let her know that she had no abnormalities on her image.

## 2022-11-11 NOTE — Telephone Encounter (Signed)
-----   Message from Jonathon Bellows, MD sent at 11/11/2022  8:46 AM EST ----- No abnormalities

## 2022-11-11 NOTE — Progress Notes (Signed)
No abnormalities

## 2022-11-19 DIAGNOSIS — M65871 Other synovitis and tenosynovitis, right ankle and foot: Secondary | ICD-10-CM | POA: Diagnosis not present

## 2022-11-19 DIAGNOSIS — M79671 Pain in right foot: Secondary | ICD-10-CM | POA: Diagnosis not present

## 2022-11-28 ENCOUNTER — Encounter: Payer: Medicare HMO | Attending: Family Medicine | Admitting: Dietician

## 2022-11-28 ENCOUNTER — Encounter: Payer: Self-pay | Admitting: Dietician

## 2022-11-28 VITALS — Ht 59.0 in | Wt 137.8 lb

## 2022-11-28 DIAGNOSIS — K219 Gastro-esophageal reflux disease without esophagitis: Secondary | ICD-10-CM | POA: Diagnosis not present

## 2022-11-28 DIAGNOSIS — E785 Hyperlipidemia, unspecified: Secondary | ICD-10-CM | POA: Insufficient documentation

## 2022-11-28 DIAGNOSIS — E782 Mixed hyperlipidemia: Secondary | ICD-10-CM | POA: Diagnosis not present

## 2022-11-28 DIAGNOSIS — Z713 Dietary counseling and surveillance: Secondary | ICD-10-CM | POA: Diagnosis not present

## 2022-11-28 NOTE — Progress Notes (Signed)
Medical Nutrition Therapy: Visit start time: 6433  end time: 1755  Assessment:   Referral Diagnosis: hyperlipidemia Other medical history/ diagnoses: GERD Psychosocial issues/ stress concerns: none  Medications, supplements: reconciled list in medical record   Preferred learning method:  Auditory Visual Hands-on    Current weight: 137.8lbs Height: 4'11" BMI: 27.83   Progress and evaluation:  Patient reports strong family history of type 2 diabetes, has had elevated BGs in the past.  Takes 2 cholesterol meds and numbers continue to increase. Recent labs indicate total cholesterol 208, HDL 91, LDL 103, triglycerides 53.  She also voices concern of stroke and there is a strong family history on father's side Avoids fried foods most of the time; does snack on chips and large portions at times in place of a meal. Food allergies: none Special diet practices: none Patient seeks help with improved diet to reduce health risk and control weight.    Dietary Intake:  Usual eating pattern includes 1-2 meals and 1-2 snacks per day. Dining out frequency: 0-1 meals per week. Who plans meals/ buys groceries? self Who prepares meals? self  Breakfast: coffee Snack: none Lunch: 12:30-1pm salad; [employer] breakfast bowl; cereal ie cheerios; baked fish  Snack: sometimes chips if only one meal in the day could be up to 1/2 lg bag; rarely 1/2 honey bun Supper: 5:30-6pm -- salad; pasta; frozen meal ie lean cuisine; baked meats Snack: none or chips Beverages: water, coffee in am, no sodas  Physical activity: exercise bike sporadically, 2x in past month   Intervention:   Nutrition Care Education:   Basic nutrition: basic food groups; appropriate nutrient balance; appropriate meal and snack schedule; general nutrition guidelines    Weight control: identifying healthy weight; importance of low sugar and low fat choices; appropriate food portions Advanced nutrition:  food label  reading Hyperlipidemia:  target goals for lipids; healthy and unhealthy fats; role of fiber, plant sterols; role of exercise; benefits of eating at regular intervals  Other intervention notes: Patient has made some diet changes to improve diet quality; she is motivated to continue to work on positive change. Established goals with direction from patient    Nutritional Diagnosis:  [place]-2.2 Altered nutrition-related laboratory As related to hyperlipidemia.  As evidenced by elevated total cholesterol and LDL while taking 2 cholesterol lowering medications. -3.4 Unintentional weight gain As related to inadequate physical activity; erratic eating pattern.  As evidenced by patient with current BMI of 27.8.   Education Materials given:  Foods to Choose to [employer] with food lists, sample meal pattern Sample menus Visit summary with goals/ instructions   Learner/ who was taught:  Patient   Level of understanding: Verbalizes/ demonstrates competency   Demonstrated degree of understanding via:   Teach back Learning barriers: None  Willingness to learn/ readiness for change: Eager, change in progress   Monitoring and Evaluation:  Dietary intake, exercise, blood lipids, and body weight      follow up:  01/31/23 at 11:30am

## 2022-11-28 NOTE — Patient Instructions (Signed)
Freeze leftover pieces of chicken or fish on parchment paper on a cookie sheet and when frozen, put in a freezer bag.  Keep working to eat plenty of veggies and fruits.  Substitute healthier snacks for chips most of the time -- at least a few (16 or less) Sun Chips, or 3 cups [employer]; or try a few triscuit crackers or 1/4c nuts or seeds. Fruit, yogurt are healthy sweet snacks. Plan to eat a meal or snack every 3-5 hours during the day.  Ride exercise bike, or use an exercise video, dance, or just move for 15 minutes or more. This can be done several times a day if needed to reach 30 minutes or more most days of the week.

## 2022-12-02 NOTE — Progress Notes (Unsigned)
Delsa Grana, PA-C   No chief complaint on file.   HPI:      [name] is a 61 y.o. G2P2 whose LMP was No LMP recorded. Patient has had a hysterectomy., presents today for ***    Patient Active Problem List   Diagnosis Date Noted   Mixed hyperlipidemia 09/16/2022   Pelvic floor weakness in female 03/03/2020   Polyp of ascending colon    Carotid atherosclerosis, bilateral 11/06/2017   Hx of completed stroke 10/28/2017   Skin pigmentation disorder 07/24/2016   Allergic rhinitis 06/30/2016   Eczema 09/13/2015   Irritable bowel syndrome with constipation 09/13/2015   Seizures ([place]) 08/16/2015   [place]icker[place]'s migraine 07/06/2012    Past Surgical History:  Procedure Laterality Date   ABDOMINAL HYSTERECTOMY  40s   endometriosis, fibroid tumors; no cancer, partial   BREAST EXCISIONAL BIOPSY Left 1980'S   EXCISIONAL - NEG   BREAST SURGERY     CHOLECYSTECTOMY  31   COLONOSCOPY WITH PROPOFOL N/A 06/23/2019   Procedure: COLONOSCOPY WITH PROPOFOL;  Surgeon: Virgel Manifold, MD;  Location: [facility] ENDOSCOPY;  Service: Endoscopy;  Laterality: N/A;   COLONOSCOPY WITH PROPOFOL N/A 12/28/2020   Procedure: COLONOSCOPY WITH PROPOFOL;  Surgeon: Virgel Manifold, MD;  Location: [facility] ENDOSCOPY;  Service: Endoscopy;  Laterality: N/A;   ESOPHAGOGASTRODUODENOSCOPY (EGD) WITH PROPOFOL N/A 06/23/2019   Procedure: ESOPHAGOGASTRODUODENOSCOPY (EGD) WITH PROPOFOL;  Surgeon: Virgel Manifold, MD;  Location: [facility] ENDOSCOPY;  Service: Endoscopy;  Laterality: N/A;   ESOPHAGOGASTRODUODENOSCOPY (EGD) WITH PROPOFOL N/A 12/28/2020   Procedure: ESOPHAGOGASTRODUODENOSCOPY (EGD) WITH PROPOFOL;  Surgeon: Virgel Manifold, MD;  Location: [facility] ENDOSCOPY;  Service: Endoscopy;  Laterality: N/A;   FOOT SURGERY Right    tendon release    Family History  Problem Relation Age of Onset   Coronary artery disease Maternal Uncle    Diabetes Mellitus II Mother    Arthritis Mother    Diabetes  Maternal Uncle    Kidney cancer Father 62       died 05/10/17   Kidney disease Father    Cancer Father        kidney and bladder   Arthritis Maternal Uncle    Cancer Maternal Grandmother        stomach?   Jaundice Maternal Grandmother    Stomach cancer Paternal Grandmother    Cancer Paternal Grandmother        stomach   Hypertension Sister    Stroke Maternal Grandfather    Diabetes Paternal Grandfather    Diabetes Sister    Hypertension Sister    Breast cancer Neg Hx    Bladder Cancer Neg Hx     Social History   Socioeconomic History   Marital status: Legally Separated    Spouse name: Not on file   Number of children: 2   Years of education: Not on file   Highest education level: GED or equivalent  Occupational History   Occupation: unemployed  Tobacco Use   Smoking status: Never   Smokeless tobacco: Never  Vaping Use   Vaping Use: Never used  Substance and Sexual Activity   Alcohol use: Yes    Comment: occasionally   Drug use: Never   Sexual activity: Yes    Partners: Male    Birth control/protection: Surgical    Comment: Hysterectomy  Other Topics Concern   Not on file  Social History Narrative   Regular exercise   Social Determinants of Health   Financial  Resource Strain: Low Risk  (05/09/2022)   Overall Financial Resource Strain (CARDIA)    Difficulty of Paying Living Expenses: Not very hard  Food Insecurity: No Food Insecurity (05/09/2022)   Hunger Vital Sign    Worried About Running Out of Food in the Last Year: Never true    Ran Out of Food in the Last Year: Never true  Transportation Needs: No Transportation Needs (05/09/2022)   PRAPARE - Hydrologist (Medical): No    Lack of Transportation (Non-Medical): No  Physical Activity: Sufficiently Active (05/09/2022)   Exercise Vital Sign    Days of Exercise per Week: 7 days    Minutes of Exercise per Session: 30 min  Stress: No Stress Concern Present (05/09/2022)    [place]    Feeling of Stress : Not at all  Social Connections: Moderately Isolated (05/09/2022)   Social Connection and Isolation Panel [NHANES]    Frequency of Communication with Friends and Family: More than three times a week    Frequency of Social Gatherings with Friends and Family: Three times a week    Attends Religious Services: More than 4 times per year    Active Member of Clubs or Organizations: No    Attends Archivist Meetings: Never    Marital Status: Separated  Intimate Partner Violence: Not At Risk (05/09/2022)   Humiliation, Afraid, Rape, and Kick questionnaire    Fear of Current or Ex-Partner: No    Emotionally Abused: No    Physically Abused: No    Sexually Abused: No    Outpatient Medications Prior to Visit  Medication Sig Dispense Refill   aspirin EC 81 MG EC tablet Take 1 tablet (81 mg total) by mouth daily. 30 tablet 1   atorvastatin (LIPITOR) 80 MG tablet TAKE 1 TABLET BY MOUTH EVERY DAY 90 tablet 0   cholestyramine (QUESTRAN) 4 g packet Take 1 packet (4 g total) by mouth 2 (two) times daily. For loose stools. 60 each 3   dicyclomine (BENTYL) 10 MG capsule Take 1 capsule (10 mg total) by mouth 4 (four) times daily. 120 capsule 2   ezetimibe (ZETIA) 10 MG tablet Take 1 tablet (10 mg total) by mouth daily. 90 tablet 3   famotidine (PEPCID) 20 MG tablet Take 1 tablet (20 mg total) by mouth at bedtime. 90 tablet 0   fexofenadine (ALLEGRA) 180 MG tablet Take 180 mg by mouth daily.     FLUZONE QUADRIVALENT 0.5 ML injection      gabapentin (NEURONTIN) 100 MG capsule Take 300 mg by mouth at bedtime.     loperamide (IMODIUM A-D) 2 MG tablet Take 1 tablet (2 mg total) by mouth as needed for diarrhea or loose stools. 30 tablet 0   meloxicam (MOBIC) 15 MG tablet Take 1 tablet (15 mg total) by mouth daily. 30 tablet 0   naproxen (NAPROSYN) 500 MG tablet Take by mouth.     omeprazole (PRILOSEC) 40 MG  capsule Take 1 capsule (40 mg total) by mouth in the morning and at bedtime. 180 capsule 3   Rimegepant Sulfate (NURTEC) 75 MG TBDP Take by mouth.     SHINGRIX injection      tacrolimus (PROTOPIC) 0.1 % ointment Apply to face 1-2 times daily as needed for itch. (Patient not taking: Reported on 10/28/2022) 30 g 1   topiramate (TOPAMAX) 200 MG tablet Take 200 mg by mouth 2 (two) times daily.  11   UBRELVY 100 MG TABS Take 1 tablet by mouth as needed.     No facility-administered medications prior to visit.      ROS:  Review of Systems BREAST: No symptoms   OBJECTIVE:   Vitals:  There were no vitals taken for this visit.  Physical Exam  Results: No results found for this or any previous visit (from the past 24 hour(s)).   Assessment/Plan: No diagnosis found.    No orders of the defined types were placed in this encounter.     No follow-ups on file.  Johncarlos Holtsclaw B. Christopherjame Carnell, PA-C 12/02/2022 3:23 PM

## 2022-12-03 ENCOUNTER — Encounter: Payer: Self-pay | Admitting: Obstetrics and Gynecology

## 2022-12-03 ENCOUNTER — Ambulatory Visit (INDEPENDENT_AMBULATORY_CARE_PROVIDER_SITE_OTHER): Payer: Medicare HMO | Admitting: Obstetrics and Gynecology

## 2022-12-03 ENCOUNTER — Other Ambulatory Visit (HOSPITAL_COMMUNITY)
Admission: RE | Admit: 2022-12-03 | Discharge: 2022-12-03 | Disposition: A | Discharge status: Home / Self Care | Payer: Medicare HMO | Source: Ambulatory Visit | Attending: Obstetrics and Gynecology | Admitting: Obstetrics and Gynecology

## 2022-12-03 VITALS — BP 114/70 | Ht 59.0 in | Wt 137.0 lb

## 2022-12-03 DIAGNOSIS — D049 Carcinoma in situ of skin, unspecified: Secondary | ICD-10-CM | POA: Diagnosis not present

## 2022-12-03 DIAGNOSIS — N9089 Other specified noninflammatory disorders of vulva and perineum: Secondary | ICD-10-CM | POA: Insufficient documentation

## 2022-12-03 DIAGNOSIS — C519 Malignant neoplasm of vulva, unspecified: Secondary | ICD-10-CM | POA: Diagnosis not present

## 2022-12-10 LAB — SURGICAL PATHOLOGY

## 2022-12-10 NOTE — Addendum Note (Signed)
Addended by: Ardeth Perfect B on: 44/96/7591 04:31 PM   Modules accepted: Orders

## 2022-12-12 NOTE — Addendum Note (Signed)
Addended by: Ardeth Perfect B on: 36/64/4034 02:03 PM   Modules accepted: Orders

## 2022-12-12 NOTE — Addendum Note (Signed)
Addended by: Ardeth Perfect B on: 70/96/2836 02:12 PM   Modules accepted: Orders

## 2022-12-18 ENCOUNTER — Encounter: Payer: Self-pay | Admitting: *Deleted

## 2022-12-18 ENCOUNTER — Inpatient Hospital Stay: Payer: Medicare HMO | Attending: Obstetrics and Gynecology | Admitting: Obstetrics and Gynecology

## 2022-12-18 ENCOUNTER — Encounter: Payer: Self-pay | Admitting: Obstetrics and Gynecology

## 2022-12-18 ENCOUNTER — Other Ambulatory Visit: Payer: Self-pay

## 2022-12-18 ENCOUNTER — Inpatient Hospital Stay: Payer: Medicare HMO

## 2022-12-18 VITALS — BP 119/86 | HR 71 | Temp 98.0°F | Resp 20 | Ht 60.0 in | Wt 140.0 lb

## 2022-12-18 DIAGNOSIS — Z8673 Personal history of transient ischemic attack (TIA), and cerebral infarction without residual deficits: Secondary | ICD-10-CM | POA: Diagnosis not present

## 2022-12-18 DIAGNOSIS — I6523 Occlusion and stenosis of bilateral carotid arteries: Secondary | ICD-10-CM | POA: Diagnosis not present

## 2022-12-18 DIAGNOSIS — C519 Malignant neoplasm of vulva, unspecified: Secondary | ICD-10-CM | POA: Insufficient documentation

## 2022-12-18 DIAGNOSIS — E785 Hyperlipidemia, unspecified: Secondary | ICD-10-CM | POA: Insufficient documentation

## 2022-12-18 DIAGNOSIS — K219 Gastro-esophageal reflux disease without esophagitis: Secondary | ICD-10-CM | POA: Insufficient documentation

## 2022-12-18 DIAGNOSIS — Z7189 Other specified counseling: Secondary | ICD-10-CM | POA: Diagnosis not present

## 2022-12-18 DIAGNOSIS — K581 Irritable bowel syndrome with constipation: Secondary | ICD-10-CM | POA: Insufficient documentation

## 2022-12-18 DIAGNOSIS — Z79899 Other long term (current) drug therapy: Secondary | ICD-10-CM | POA: Diagnosis not present

## 2022-12-18 DIAGNOSIS — R569 Unspecified convulsions: Secondary | ICD-10-CM | POA: Diagnosis not present

## 2022-12-18 LAB — COMPREHENSIVE METABOLIC PANEL
ALT: 52 U/L — ABNORMAL HIGH (ref 0–44)
AST: 40 U/L (ref 15–41)
Albumin: 4.2 g/dL (ref 3.5–5.0)
Alkaline Phosphatase: 69 U/L (ref 38–126)
Anion gap: 7 (ref 5–15)
BUN: 17 mg/dL (ref 6–20)
CO2: 21 mmol/L — ABNORMAL LOW (ref 22–32)
Calcium: 9.5 mg/dL (ref 8.9–10.3)
Chloride: 112 mmol/L — ABNORMAL HIGH (ref 98–111)
Creatinine, Ser: 0.89 mg/dL (ref 0.44–1.00)
GFR, Estimated: >60 mL/min (ref >60)
Glucose, Bld: 102 mg/dL — ABNORMAL HIGH (ref 70–99)
Potassium: 4.1 mmol/L (ref 3.5–5.1)
Sodium: 140 mmol/L (ref 135–145)
Total Bilirubin: 0.9 mg/dL (ref 0.3–1.2)
Total Protein: 7 g/dL (ref 6.5–8.1)

## 2022-12-18 NOTE — H&P (View-Only) (Signed)
Gynecologic Oncology History and Physical   Referring Provider: Ardeth Perfect, PA   Chief Concern: SCC of vulva  Subjective:  Elizabeth Mata is a 61 y.o. female who is seen in consultation from [place], [place] [facility]) for newly diagnosed squamous cell carcinoma of the vulva.   61 y.o. G2P2 whose LMP was No LMP recorded. Patient has had a hysterectomy., presents today for enlarging labial lesion. She had noticed what she thought was ingrown hair for several months which then became painful and enlarging. She attempted home I&D then sought evaluation with gynecology.     12/03/2022 biopsy performed - Specimen was 0.3 x 0.3 x 0.2 cm fragment     Component 2 wk ago  SURGICAL PATHOLOGY SURGICAL PATHOLOGY CASE: [id number] PATIENT: Elizabeth Mata Surgical Pathology Report     Clinical History: Labial lesion (nt)     FINAL MICROSCOPIC DIAGNOSIS:  A. LABIA, RIGHT LESION, BIOPSY: - Moderately differentiated squamous cell carcinoma - Carcinoma is present at biopsy edge      11/10/2022 CT A/P IMPRESSION: No acute findings in the abdomen or pelvis.   HIV nonreactive 08/09/2019  She reports feeling well otherwise. Denies unintentional weight loss, lumps or bumps elsewhere. No changes in stool, pelvic pain.   Problem List: Patient Active Problem List   Diagnosis Date Noted   Vulvar cancer ([place]) 12/18/2022   Mixed hyperlipidemia 09/16/2022   Pelvic floor weakness in female 03/03/2020   Polyp of ascending colon    Carotid atherosclerosis, bilateral 11/06/2017   Hx of completed stroke 10/28/2017   Skin pigmentation disorder 07/24/2016   Allergic rhinitis 06/30/2016   Eczema 09/13/2015   Irritable bowel syndrome with constipation 09/13/2015   Seizures ([place]) 08/16/2015   Bickerstaff's migraine 07/06/2012    Past Medical History: Past Medical History:  Diagnosis Date   Allergic rhinitis 06/30/2016   Allergy    Carotid atherosclerosis, bilateral 11/06/2017    [place] Nov 2018   Cyst of breast, left, solitary    Flank pain    GERD (gastroesophageal reflux disease)    Hemorrhoid    Hemorrhoids    Hyperlipidemia    IFG (impaired fasting glucose) 07/18/2016   June 2017    Kidney stone on left side 10/24/2015   Kidney stones    Neuropathy 07/24/2016   Seizures ([place])    Shingles outbreak 02/03/2017   Stomach irritation    Syncope    Recurrent over the last 2 years. had workup at [facility] this year with cardiology and neurology. Had echo and 3-week event monitor that were unremarkable. Has had negative EEG. Echo in 11/11 at [facility] showed no significant abnormalities. Possibly vasovagal   TIA (transient ischemic attack) 10/28/2017   Trigger thumb     Past Surgical History: Past Surgical History:  Procedure Laterality Date   ABDOMINAL HYSTERECTOMY  40s   endometriosis, fibroid tumors; no cancer, partial   BREAST EXCISIONAL BIOPSY Left 1980'S   EXCISIONAL - NEG   BREAST SURGERY     CHOLECYSTECTOMY  31   COLONOSCOPY WITH PROPOFOL N/A 06/23/2019   Procedure: COLONOSCOPY WITH PROPOFOL;  Surgeon: Virgel Manifold, MD;  Location: [facility] ENDOSCOPY;  Service: Endoscopy;  Laterality: N/A;   COLONOSCOPY WITH PROPOFOL N/A 12/28/2020   Procedure: COLONOSCOPY WITH PROPOFOL;  Surgeon: Virgel Manifold, MD;  Location: [facility] ENDOSCOPY;  Service: Endoscopy;  Laterality: N/A;   ESOPHAGOGASTRODUODENOSCOPY (EGD) WITH PROPOFOL N/A 06/23/2019   Procedure: ESOPHAGOGASTRODUODENOSCOPY (EGD) WITH PROPOFOL;  Surgeon: Virgel Manifold, MD;  Location: [facility] ENDOSCOPY;  Service: Endoscopy;  Laterality: N/A;   ESOPHAGOGASTRODUODENOSCOPY (EGD) WITH PROPOFOL N/A 12/28/2020   Procedure: ESOPHAGOGASTRODUODENOSCOPY (EGD) WITH PROPOFOL;  Surgeon: Virgel Manifold, MD;  Location: [facility] ENDOSCOPY;  Service: Endoscopy;  Laterality: N/A;   FOOT SURGERY Right    tendon release    Past Gynecologic History:  As per HPI  OB History:  OB History  Gravida Para Term Preterm AB Living   '2 2       2  '$ SAB IAB Ectopic Multiple Live Births               # Outcome Date GA Lbr Len/2nd Weight Sex Delivery Anes PTL Lv  2 Para           1 Para             Obstetric Comments  Menstrual age: 5    Age 1st Pregnancy: 23    Family History: Family History  Problem Relation Age of Onset   Coronary artery disease Maternal Uncle    Diabetes Mellitus II Mother    Arthritis Mother    Diabetes Maternal Uncle    Kidney cancer Father 5       died 2017/05/19   Kidney disease Father    Cancer Father        kidney and bladder   Arthritis Maternal Uncle    Cancer Maternal Grandmother        stomach?   Jaundice Maternal Grandmother    Stomach cancer Paternal Grandmother    Cancer Paternal Grandmother        stomach   Hypertension Sister    Stroke Maternal Grandfather    Diabetes Paternal Grandfather    Diabetes Sister    Hypertension Sister    Breast cancer Neg Hx    Bladder Cancer Neg Hx     Social History: Social History   Socioeconomic History   Marital status: Legally Separated    Spouse name: Not on file   Number of children: 2   Years of education: Not on file   Highest education level: GED or equivalent  Occupational History   Occupation: unemployed  Tobacco Use   Smoking status: Never    Passive exposure: Never   Smokeless tobacco: Never  Vaping Use   Vaping Use: Never used  Substance and Sexual Activity   Alcohol use: Yes    Comment: occasionally   Drug use: Never   Sexual activity: Yes    Partners: Male    Birth control/protection: Surgical    Comment: Hysterectomy  Other Topics Concern   Not on file  Social History Narrative   Regular exercise   Social Determinants of Health   Financial Resource Strain: Low Risk  (05/09/2022)   Overall Financial Resource Strain (CARDIA)    Difficulty of Paying Living Expenses: Not very hard  Food Insecurity: No Food Insecurity (12/18/2022)   Hunger Vital Sign    Worried About Running Out of Food  in the Last Year: Never true    Ran Out of Food in the Last Year: Never true  Transportation Needs: No Transportation Needs (12/18/2022)   PRAPARE - Hydrologist (Medical): No    Lack of Transportation (Non-Medical): No  Physical Activity: Sufficiently Active (05/09/2022)   Exercise Vital Sign    Days of Exercise per Week: 7 days    Minutes of Exercise per Session: 30 min  Stress: No Stress Concern Present (05/09/2022)   [employer] - Occupational Stress Questionnaire    Feeling of Stress : Not at all  Social Connections: Moderately Isolated (05/09/2022)   Social Connection and Isolation Panel [NHANES]    Frequency of Communication with Friends and Family: More than three times a week    Frequency of Social Gatherings with Friends and Family: Three times a week    Attends Religious Services: More than 4 times per year    Active Member of Clubs or Organizations: No    Attends Archivist Meetings: Never    Marital Status: Separated  Intimate Partner Violence: Not At Risk (12/18/2022)   Humiliation, Afraid, Rape, and Kick questionnaire    Fear of Current or Ex-Partner: No    Emotionally Abused: No    Physically Abused: No    Sexually Abused: No    Allergies: Allergies  Allergen Reactions   Clindamycin/Lincomycin Hives   Hydrocodone Nausea Only   Metronidazole Hives   Sulfa Antibiotics Hives    Break out in hives all over   Oxycodone Nausea And Vomiting    Current Medications: Current Outpatient Medications  Medication Sig Dispense Refill   aspirin EC 81 MG EC tablet Take 1 tablet (81 mg total) by mouth daily. 30 tablet 1   atorvastatin (LIPITOR) 80 MG tablet TAKE 1 TABLET BY MOUTH EVERY DAY 90 tablet 0   ezetimibe (ZETIA) 10 MG tablet Take 1 tablet (10 mg total) by mouth daily. 90 tablet 3   fexofenadine (ALLEGRA) 180 MG tablet Take 180 mg by mouth daily.     topiramate (TOPAMAX) 200 MG tablet Take 200 mg by  mouth 2 (two) times daily.   11   cholestyramine (QUESTRAN) 4 g packet Take 1 packet (4 g total) by mouth 2 (two) times daily. For loose stools. (Patient not taking: Reported on 12/18/2022) 60 each 3   dicyclomine (BENTYL) 10 MG capsule Take 1 capsule (10 mg total) by mouth 4 (four) times daily. (Patient not taking: Reported on 12/18/2022) 120 capsule 2   famotidine (PEPCID) 20 MG tablet Take 1 tablet (20 mg total) by mouth at bedtime. (Patient not taking: Reported on 12/18/2022) 90 tablet 0   gabapentin (NEURONTIN) 100 MG capsule Take 300 mg by mouth at bedtime. (Patient not taking: Reported on 12/18/2022)     loperamide (IMODIUM A-D) 2 MG tablet Take 1 tablet (2 mg total) by mouth as needed for diarrhea or loose stools. (Patient not taking: Reported on 12/18/2022) 30 tablet 0   omeprazole (PRILOSEC) 40 MG capsule Take 1 capsule (40 mg total) by mouth in the morning and at bedtime. (Patient not taking: Reported on 12/18/2022) 180 capsule 3   Rimegepant Sulfate (NURTEC) 75 MG TBDP Take by mouth. (Patient not taking: Reported on 12/18/2022)     UBRELVY 100 MG TABS Take 1 tablet by mouth as needed. (Patient not taking: Reported on 12/18/2022)     No current facility-administered medications for this visit.    Review of Systems General: negative for fevers, changes in weight or night sweats Skin: negative for changes in moles or sores or rash Eyes: negative for changes in vision HEENT: negative for change in hearing, tinnitus, voice changes Pulmonary: negative for dyspnea, orthopnea, productive cough, wheezing Cardiac: negative for palpitations, pain Gastrointestinal: negative for nausea, vomiting, constipation, diarrhea, hematemesis, hematochezia Genitourinary/Sexual: negative for dysuria, retention, hematuria, incontinence Ob/Gyn:  negative for abnormal bleeding, or pain; she still has some discomfort at the biopsy site Musculoskeletal: negative for pain, joint pain, back pain Hematology:  negative for easy bruising, abnormal bleeding Neurologic/Psych: negative for headaches, seizures, paralysis, weakness, numbness   Objective:  Physical Examination:  BP 119/86   Pulse 71   Temp 98 F (36.7 C) (Tympanic)   Resp 20   Ht 5' (1.524 m)   Wt 140 lb (63.5 kg)   BMI 27.34 kg/m    ECOG Performance Status: 1 - Symptomatic but completely ambulatory  GENERAL: Patient is a well appearing female in no acute distress. Accompanied by daughter HEENT:  PERRL, neck supple with midline trachea.  NODES:  No cervical, supraclavicular, axillary, or inguinal lymphadenopathy palpated.  LUNGS:  Clear to auscultation bilaterally.   HEART:  Regular rate and rhythm. ABDOMEN:  Soft, nontender, nondistended.  No masses ascites or hernias. EXTREMITIES:  No peripheral edema.   NEURO:  Nonfocal. Well oriented.  Appropriate affect.  Pelvic chaperoned by nurse;  Vulva: small 1-2 mm area of healing biopsy site o/w normal appearing vulva with no masses, tenderness or lesions. We measured the site based on landmarks ~3 cm lateral of the medial hair line and 5-6 cm inferior to the clitoral hood Vagina: normal vagina and cuff healing well. Cervix, Uterus: surgically absent.  Bimanual: normal, no masses        Lab Review Lab Results  Component Value Date   WBC 5.1 09/16/2022   HGB 13.9 09/16/2022   HCT 40.8 09/16/2022   MCV 92.1 09/16/2022   PLT 302 09/16/2022      Chemistry      Component Value Date/Time   NA 146 09/16/2022 1509   NA 145 (H) 10/24/2015 1645   NA 144 12/31/2014 1450   K 4.6 09/16/2022 1509   K 3.6 12/31/2014 1450   CL 113 (H) 09/16/2022 1509   CL 113 (H) 12/31/2014 1450   CO2 26 09/16/2022 1509   CO2 21 12/31/2014 1450   BUN 10 09/16/2022 1509   BUN 11 10/24/2015 1645   BUN 17 12/31/2014 1450   CREATININE 1.02 09/16/2022 1509      Component Value Date/Time   CALCIUM 10.7 (H) 09/16/2022 1509   CALCIUM 9.4 12/31/2014 1450   ALKPHOS 66 08/05/2020 0533   ALKPHOS  88 05/03/2012 0903   AST 25 09/16/2022 1509   AST 30 05/03/2012 0903   ALT 25 09/16/2022 1509   ALT 25 05/03/2012 0903   BILITOT 0.5 09/16/2022 1509   BILITOT 0.4 07/21/2019 1535   BILITOT 0.6 05/03/2012 0903       Radiologic Imaging: 11/07/2022 CT C/P Narrative & Impression  CLINICAL DATA:  Abdominal pain, chronic (Ped 0-17y) Abdominal pain, chronic, epigastric   EXAM: CT ABDOMEN AND PELVIS WITH CONTRAST   TECHNIQUE: Multidetector CT imaging of the abdomen and pelvis was performed using the standard protocol following bolus administration of intravenous contrast.   RADIATION DOSE REDUCTION: This exam was performed according to the departmental dose-optimization program which includes automated exposure control, adjustment of the mA and/or kV according to patient size and/or use of iterative reconstruction technique.   CONTRAST:  129m OMNIPAQUE IOHEXOL 300 MG/ML  SOLN   COMPARISON:  07/26/2019   FINDINGS: Lower chest: No acute abnormality   Hepatobiliary: Prior cholecystectomy. No biliary ductal dilatation. Hypodensity in the inferior right hepatic lobe measures 7 mm, most compatible with cyst.   Pancreas: No focal abnormality or ductal dilatation.   Spleen: No focal abnormality.  Normal size.   Adrenals/Urinary Tract: No focal hepatic abnormality. Gallbladder unremarkable.   Stomach/Bowel: Stomach, large and small bowel grossly unremarkable.  Vascular/Lymphatic: No evidence of aneurysm or adenopathy.   Reproductive: Prior hysterectomy.  No adnexal masses.   Other: No free fluid or free air.   Musculoskeletal: No acute bony abnormality.   IMPRESSION: No acute findings in the abdomen or pelvis.     Electronically Signed   By: Rolm Baptise M.D.   On: 11/10/2022 20:37      Assessment:  Elizabeth Mata is a 61 y.o. female diagnosed with moderately differentiated vulvar cancer, the lesion was grossly resected. Reassuring imaging and exam.   H/o  hypercalcemia  Medical co-morbidities complicating care: h/o Carotid atherosclerosis, bilateral with TIA 2018; Hyperlipidemia and h/o seizures on therapy Plan:   Problem List Items Addressed This Visit       Genitourinary   Vulvar cancer ([place]) - Primary   Other Visit Diagnoses     Counseling and coordination of care           We discussed options for management including surgery. Based on her exam we identified the location of the lesion based on the 2 mm area of healing  and I have recommended another excision to determine if there is any residual disease and if so the depth of invasion and size. Given the lack of a gross lesion I do not recommend a more invasive procedure. We recommend exam under anesthesia colposcopy of the vulva and wide local excision.   Risks were discussed in detail. These include infection, anesthesia, bleeding, transfusion, wound separation, medical issues (blood clots, stroke, heart attack, fluid in the lungs, pneumonia, abnormal heart rhythm, death).  Consent signed.   Obtain CXR and CMP. Check with anesthesia regarding seizure history topiramate (TOPAMAX) 200 MG tablet dosing.   Suggested return to clinic in  4 - 6 weeks after surgery.     The patient's diagnosis, an outline of the further diagnostic and laboratory studies which will be required, the recommendation, and alternatives were discussed.  All questions were answered to the patient's satisfaction.  A total of 80 minutes were spent with the patient/family today; >50% was spent in education, counseling and coordination of care for vulvar cancer.    I personally had a face to face interaction and evaluated the patient jointly with the NP, Ms. Beckey Rutter.  I have reviewed her history and available records and have performed the key portions of the physical exam including lymph node survey, abdominal exam, pelvic exam with my findings confirming those documented above by the APP.  I have discussed  the case with the APP and the patient.  I agree with the above documentation, assessment and plan which was fully formulated by me.  Counseling was completed by me.   I personally saw the patient and performed a substantive portion of this encounter in conjunction with the listed APP as documented above.  Trevell Pariseau Gaetana Michaelis, MD    CC:  Ardeth Perfect, [place]

## 2022-12-18 NOTE — Patient Instructions (Signed)
You will need a CXR prior to surgery. This will be done at the medical mall at [place] do not need an appointment for this. We will arrange a pre-admit testing appointment. We will call you once arranged. Dr. Theora Gianotti will see you back 4 weeks after surgery.  Vulvectomy, Wide Local Excision, or Laser Ablation: Surgical Information  The vulva is the external female genitalia, outside and around the vagina and pubic bone. It consists of:  The skin on, and in front of, the pubic bone.  The clitoris.  The labia majora (large lips) on the outside of the vagina.  The labia minora (small lips) around the opening of the vagina.  The opening and the skin in and around the vagina.   A vulvectomy or wide local excision of the vulva is the removal of the tissue of the vulva, which sometimes includes removal of the lymph nodes and tissue in the groin areas.  After surgery, a doctor will look at the tissue under a microscope. He or she will check it for abnormal cells.    Laser ablation is a procedure used to treat precancerous skin changes. The top layers of skin are removed with the laser. The wound is then covered with ointment. A scab will form over the area. The wound may take 3 to 6 week s to heal, depending on the size of the area treated. Good wound care may help the scar fade with time.  Most women go home 1 to 4 hours after surgery. You may need to stay overnight if you had lymph nodes removed. You will probably be able to return to your normal routine in 1 or 2 weeks.    Incision Care after surgery Do not douche or use tampons  Do not have sexual intercourse until your caregiver gives you permission.  After moving your bowels or urinating use a bottle filled with warm water (peri-bottle) to clean the area. Gently wipe from front to back or pat the skin dry. You may use a blow dryer on the cool setting to help dry the skin.  Wear loose cotton underwear. Do not wear tight pants.  A sitz  bath will help keep your perineal area clean, reduce swelling, and provide comfort.  You may notice that your urine stream is at a different angle, and may spray. Using a plastic funnel may help decrease spray  Avoid exercises, such as running or biking for 3 weeks or as directed.  If your doctor has removed lymph nodes from your groin area, there may be an increase in swelling of your legs and feet. You can help prevent swelling by doing the following:    Elevate your legs while sitting or lying down.    Avoid standing in one place for long periods of time.    Avoid salt in your diet. It can cause fluid retention and swelling.    Do not cross your legs, especially when sitting.   Follow-up care is a key part of your treatment and safety. Be sure to make and go to all appointments, and call your doctor if you are having problems. It's also a good idea to know your test results and keep a list of the me dicines you take.        Vulvectomy  Vulvectomy is a surgical procedure to remove all or part of the outside part of the female genitals (vulva). The vulva includes the outside folds of skin (labia majora), the inner lips (  labia minora), the clitoris, the outer opening of the bladder (urethra), and the opening of the vagina. You may need this surgery if you have a cancerous growth in your vulva. There are different types of vulvectomy: A simple vulvectomy. This is the removal of the entire vulva, which includes the labia majora, the labia minora, and sometimes the clitoris and the tissue just under the skin. A partial or modified radical vulvectomy. This is when part of the vulva and surrounding deep tissue is removed. Some of the lymph nodes in the area may also be removed. A complete radical vulvectomy. This is when the entire vulva, including the clitoris and surrounding deep tissue, is removed along with the surrounding lymph nodes. This is rarely needed. A skinning vulvectomy. This type removes  only the top layer of skin affected by the cancer. This is rarely needed. Tell a health care provider about: Any allergies you have. All medicines you are taking, including vitamins, herbs, eye drops, creams, and over-the-counter medicines. Any problems you or family members have had with anesthetic medicines. Any bleeding problems you have. Any surgeries you have had. Any medical conditions you have. Whether you are pregnant or may be pregnant. What are the risks? Generally, this is a safe procedure. However, problems may occur, including: Bleeding. Infection. Delayed wound healing. Urinary tract infections. Lymphedema. This is when your legs swell after the removal of lymph nodes from your groin area. Narrowed vaginal opening or genital numbness. Pain when having sex or decreased sexual pleasure. A blood clot that may travel to the lung (pulmonary embolism). What happens before the procedure? When to stop eating and drinking Follow instructions from your health care provider about what you may eat and drink before your procedure. These may include: 8 hours before your procedure Stop eating most foods. Do not eat meat, fried foods, or fatty foods. Eat only light foods, such as toast or crackers. All liquids are okay except energy drinks and alcohol. 6 hours before your procedure Stop eating. Drink only clear liquids, such as water, clear fruit juice, black coffee, plain tea, and sports drinks. Do not drink energy drinks or alcohol. 2 hours before your procedure Stop drinking all liquids. You may be allowed to take medicines with small sips of water. If you do not follow your health care provider's instructions, your procedure may be delayed or canceled. Medicines Ask your health care provider about: Changing or stopping your regular medicines. This is especially important if you are taking diabetes medicines or blood thinners. Taking medicines such as aspirin and ibuprofen.  These medicines can thin your blood. Do not take these medicines unless your health care provider tells you to take them. Taking over-the-counter medicines, vitamins, herbs, and supplements. General instructions Do not use any products that contain nicotine or tobacco for at least 4 weeks before the procedure. These products include cigarettes, chewing tobacco, and vaping devices, such as e-cigarettes. If you need help quitting, ask your health care provider. Ask your health care provider: How your surgery site will be marked. What steps will be taken to help prevent infection. These steps may include: Removing hair at the surgery site. Washing skin with a germ-killing soap. Taking or receiving antibiotic medicine. Plan to have a responsible adult take you home from the hospital or clinic. What happens during the procedure? An IV will be inserted into one of your veins. You will be given one or more of the following: A medicine to help you relax (sedative). A  medicine to make you fall asleep (general anesthetic). A medicine that is injected into your spine to numb the area below and slightly above the injection site (spinal anesthetic). A tube (catheter) may be inserted through the urethra to drain urine during and after surgery. Depending on the type of vulvectomy you are having, your surgeon will make an incision and remove the affected area. If your lymph nodes are removed, a drain may be placed in the area to help avoid fluid buildup. In some cases, skin from another area of the body (skin graft) may be removed and placed over the affected area to close the wound. Skin grafting may be needed if a large area of skin and tissue is removed. Your incisions will be closed with stitches (sutures) and covered with a bandage (dressing). The procedure may vary among health care providers and hospitals. What happens after the procedure? Your blood pressure, heart rate, breathing rate, and blood  oxygen level will be monitored until you leave the hospital or clinic. You will get medicine for pain as needed. You may get medicine to prevent constipation. You will be asked to breathe deeply and to get out of bed and walk as soon as you can. You may have to wear compression stockings. These stockings help to prevent blood clots and reduce swelling in your legs. Your catheter will remain in place after surgery. It will be removed when your health care provider approves. Summary Vulvectomy is a surgical procedure to remove all or part of the outside part of the female genitals (vulva). Depending on the type of vulvectomy you are having, deep tissue and lymph nodes in the areas may also be removed. Follow instructions from your health care provider about taking medicines and about eating and drinking before the procedure. Your catheter will remain in place after surgery. It will be removed when your health care provider approves. This information is not intended to replace advice given to you by your health care provider. Make sure you discuss any questions you have with your health care provider. Document Revised: 09/04/2021 Document Reviewed: 09/04/2021 [employer] Patient Education  [place].

## 2022-12-18 NOTE — H&P (Signed)
Gynecologic Oncology History and Physical   Referring Provider: Ardeth Perfect, PA   Chief Concern: SCC of vulva  Subjective:  Elizabeth Mata is a 61 y.o. female who is seen in consultation from [place], [place] [facility]) for newly diagnosed squamous cell carcinoma of the vulva.   61 y.o. G2P2 whose LMP was No LMP recorded. Patient has had a hysterectomy., presents today for enlarging labial lesion. She had noticed what she thought was ingrown hair for several months which then became painful and enlarging. She attempted home I&D then sought evaluation with gynecology.     12/03/2022 biopsy performed - Specimen was 0.3 x 0.3 x 0.2 cm fragment     Component 2 wk ago  SURGICAL PATHOLOGY SURGICAL PATHOLOGY CASE: [id number] PATIENT: Elizabeth Mata Surgical Pathology Report     Clinical History: Labial lesion (nt)     FINAL MICROSCOPIC DIAGNOSIS:  A. LABIA, RIGHT LESION, BIOPSY: - Moderately differentiated squamous cell carcinoma - Carcinoma is present at biopsy edge      11/10/2022 CT A/P IMPRESSION: No acute findings in the abdomen or pelvis.   HIV nonreactive 08/09/2019  She reports feeling well otherwise. Denies unintentional weight loss, lumps or bumps elsewhere. No changes in stool, pelvic pain.   Problem List: Patient Active Problem List   Diagnosis Date Noted   Vulvar cancer ([place]) 12/18/2022   Mixed hyperlipidemia 09/16/2022   Pelvic floor weakness in female 03/03/2020   Polyp of ascending colon    Carotid atherosclerosis, bilateral 11/06/2017   Hx of completed stroke 10/28/2017   Skin pigmentation disorder 07/24/2016   Allergic rhinitis 06/30/2016   Eczema 09/13/2015   Irritable bowel syndrome with constipation 09/13/2015   Seizures ([place]) 08/16/2015   [place]icker[place]'s migraine 07/06/2012    Past Medical History: Past Medical History:  Diagnosis Date   Allergic rhinitis 06/30/2016   Allergy    Carotid atherosclerosis, bilateral 11/06/2017    [place] Nov 2018   Cyst of breast, left, solitary    Flank pain    GERD (gastroesophageal reflux disease)    Hemorrhoid    Hemorrhoids    Hyperlipidemia    IFG (impaired fasting glucose) 07/18/2016   June 2017    Kidney stone on left side 10/24/2015   Kidney stones    Neuropathy 07/24/2016   Seizures ([place])    Shingles outbreak 02/03/2017   Stomach irritation    Syncope    Recurrent over the last 2 years. had workup at [facility] this year with cardiology and neurology. Had echo and 3-week event monitor that were unremarkable. Has had negative EEG. Echo in 11/11 at [facility] showed no significant abnormalities. Possibly vasovagal   TIA (transient ischemic attack) 10/28/2017   Trigger thumb     Past Surgical History: Past Surgical History:  Procedure Laterality Date   ABDOMINAL HYSTERECTOMY  40s   endometriosis, fibroid tumors; no cancer, partial   BREAST EXCISIONAL BIOPSY Left 1980'S   EXCISIONAL - NEG   BREAST SURGERY     CHOLECYSTECTOMY  31   COLONOSCOPY WITH PROPOFOL N/A 06/23/2019   Procedure: COLONOSCOPY WITH PROPOFOL;  Surgeon: Virgel Manifold, MD;  Location: [facility] ENDOSCOPY;  Service: Endoscopy;  Laterality: N/A;   COLONOSCOPY WITH PROPOFOL N/A 12/28/2020   Procedure: COLONOSCOPY WITH PROPOFOL;  Surgeon: Virgel Manifold, MD;  Location: [facility] ENDOSCOPY;  Service: Endoscopy;  Laterality: N/A;   ESOPHAGOGASTRODUODENOSCOPY (EGD) WITH PROPOFOL N/A 06/23/2019   Procedure: ESOPHAGOGASTRODUODENOSCOPY (EGD) WITH PROPOFOL;  Surgeon: Virgel Manifold, MD;  Location: [facility] ENDOSCOPY;  Service: Endoscopy;  Laterality: N/A;   ESOPHAGOGASTRODUODENOSCOPY (EGD) WITH PROPOFOL N/A 12/28/2020   Procedure: ESOPHAGOGASTRODUODENOSCOPY (EGD) WITH PROPOFOL;  Surgeon: Virgel Manifold, MD;  Location: [facility] ENDOSCOPY;  Service: Endoscopy;  Laterality: N/A;   FOOT SURGERY Right    tendon release    Past Gynecologic History:  As per HPI  OB History:  OB History  Gravida Para Term Preterm AB Living   '2 2       2  '$ SAB IAB Ectopic Multiple Live Births               # Outcome Date GA Lbr Len/2nd Weight Sex Delivery Anes PTL Lv  2 Para           1 Para             Obstetric Comments  Menstrual age: 5    Age 1st Pregnancy: 68    Family History: Family History  Problem Relation Age of Onset   Coronary artery disease Maternal Uncle    Diabetes Mellitus II Mother    Arthritis Mother    Diabetes Maternal Uncle    Kidney cancer Father 86       died 09-May-2017   Kidney disease Father    Cancer Father        kidney and bladder   Arthritis Maternal Uncle    Cancer Maternal Grandmother        stomach?   Jaundice Maternal Grandmother    Stomach cancer Paternal Grandmother    Cancer Paternal Grandmother        stomach   Hypertension Sister    Stroke Maternal Grandfather    Diabetes Paternal Grandfather    Diabetes Sister    Hypertension Sister    Breast cancer Neg Hx    Bladder Cancer Neg Hx     Social History: Social History   Socioeconomic History   Marital status: Legally Separated    Spouse name: Not on file   Number of children: 2   Years of education: Not on file   Highest education level: GED or equivalent  Occupational History   Occupation: unemployed  Tobacco Use   Smoking status: Never    Passive exposure: Never   Smokeless tobacco: Never  Vaping Use   Vaping Use: Never used  Substance and Sexual Activity   Alcohol use: Yes    Comment: occasionally   Drug use: Never   Sexual activity: Yes    Partners: Male    Birth control/protection: Surgical    Comment: Hysterectomy  Other Topics Concern   Not on file  Social History Narrative   Regular exercise   Social Determinants of Health   Financial Resource Strain: Low Risk  (05/09/2022)   Overall Financial Resource Strain (CARDIA)    Difficulty of Paying Living Expenses: Not very hard  Food Insecurity: No Food Insecurity (12/18/2022)   Hunger Vital Sign    Worried About Running Out of Food  in the Last Year: Never true    Ran Out of Food in the Last Year: Never true  Transportation Needs: No Transportation Needs (12/18/2022)   PRAPARE - Hydrologist (Medical): No    Lack of Transportation (Non-Medical): No  Physical Activity: Sufficiently Active (05/09/2022)   Exercise Vital Sign    Days of Exercise per Week: 7 days    Minutes of Exercise per Session: 30 min  Stress: No Stress Concern Present (05/09/2022)   [employer] - Occupational Stress Questionnaire    Feeling of Stress : Not at all  Social Connections: Moderately Isolated (05/09/2022)   Social Connection and Isolation Panel [NHANES]    Frequency of Communication with Friends and Family: More than three times a week    Frequency of Social Gatherings with Friends and Family: Three times a week    Attends Religious Services: More than 4 times per year    Active Member of Clubs or Organizations: No    Attends Archivist Meetings: Never    Marital Status: Separated  Intimate Partner Violence: Not At Risk (12/18/2022)   Humiliation, Afraid, Rape, and Kick questionnaire    Fear of Current or Ex-Partner: No    Emotionally Abused: No    Physically Abused: No    Sexually Abused: No    Allergies: Allergies  Allergen Reactions   Clindamycin/Lincomycin Hives   Hydrocodone Nausea Only   Metronidazole Hives   Sulfa Antibiotics Hives    Break out in hives all over   Oxycodone Nausea And Vomiting    Current Medications: Current Outpatient Medications  Medication Sig Dispense Refill   aspirin EC 81 MG EC tablet Take 1 tablet (81 mg total) by mouth daily. 30 tablet 1   atorvastatin (LIPITOR) 80 MG tablet TAKE 1 TABLET BY MOUTH EVERY DAY 90 tablet 0   ezetimibe (ZETIA) 10 MG tablet Take 1 tablet (10 mg total) by mouth daily. 90 tablet 3   fexofenadine (ALLEGRA) 180 MG tablet Take 180 mg by mouth daily.     topiramate (TOPAMAX) 200 MG tablet Take 200 mg by  mouth 2 (two) times daily.   11   cholestyramine (QUESTRAN) 4 g packet Take 1 packet (4 g total) by mouth 2 (two) times daily. For loose stools. (Patient not taking: Reported on 12/18/2022) 60 each 3   dicyclomine (BENTYL) 10 MG capsule Take 1 capsule (10 mg total) by mouth 4 (four) times daily. (Patient not taking: Reported on 12/18/2022) 120 capsule 2   famotidine (PEPCID) 20 MG tablet Take 1 tablet (20 mg total) by mouth at bedtime. (Patient not taking: Reported on 12/18/2022) 90 tablet 0   gabapentin (NEURONTIN) 100 MG capsule Take 300 mg by mouth at bedtime. (Patient not taking: Reported on 12/18/2022)     loperamide (IMODIUM A-D) 2 MG tablet Take 1 tablet (2 mg total) by mouth as needed for diarrhea or loose stools. (Patient not taking: Reported on 12/18/2022) 30 tablet 0   omeprazole (PRILOSEC) 40 MG capsule Take 1 capsule (40 mg total) by mouth in the morning and at bedtime. (Patient not taking: Reported on 12/18/2022) 180 capsule 3   Rimegepant Sulfate (NURTEC) 75 MG TBDP Take by mouth. (Patient not taking: Reported on 12/18/2022)     UBRELVY 100 MG TABS Take 1 tablet by mouth as needed. (Patient not taking: Reported on 12/18/2022)     No current facility-administered medications for this visit.    Review of Systems General: negative for fevers, changes in weight or night sweats Skin: negative for changes in moles or sores or rash Eyes: negative for changes in vision HEENT: negative for change in hearing, tinnitus, voice changes Pulmonary: negative for dyspnea, orthopnea, productive cough, wheezing Cardiac: negative for palpitations, pain Gastrointestinal: negative for nausea, vomiting, constipation, diarrhea, hematemesis, hematochezia Genitourinary/Sexual: negative for dysuria, retention, hematuria, incontinence Ob/Gyn:  negative for abnormal bleeding, or pain; she still has some discomfort at the biopsy site Musculoskeletal: negative for pain, joint pain, back pain Hematology:  negative for easy bruising, abnormal bleeding Neurologic/Psych: negative for headaches, seizures, paralysis, weakness, numbness   Objective:  Physical Examination:  BP 119/86   Pulse 71   Temp 98 F (36.7 C) (Tympanic)   Resp 20   Ht 5' (1.524 m)   Wt 140 lb (63.5 kg)   BMI 27.34 kg/m    ECOG Performance Status: 1 - Symptomatic but completely ambulatory  GENERAL: Patient is a well appearing female in no acute distress. Accompanied by daughter HEENT:  PERRL, neck supple with midline trachea.  NODES:  No cervical, supraclavicular, axillary, or inguinal lymphadenopathy palpated.  LUNGS:  Clear to auscultation bilaterally.   HEART:  Regular rate and rhythm. ABDOMEN:  Soft, nontender, nondistended.  No masses ascites or hernias. EXTREMITIES:  No peripheral edema.   NEURO:  Nonfocal. Well oriented.  Appropriate affect.  Pelvic chaperoned by nurse;  Vulva: small 1-2 mm area of healing biopsy site o/w normal appearing vulva with no masses, tenderness or lesions. We measured the site based on landmarks ~3 cm lateral of the medial hair line and 5-6 cm inferior to the clitoral hood Vagina: normal vagina and cuff healing well. Cervix, Uterus: surgically absent.  Bimanual: normal, no masses        Lab Review Lab Results  Component Value Date   WBC 5.1 09/16/2022   HGB 13.9 09/16/2022   HCT 40.8 09/16/2022   MCV 92.1 09/16/2022   PLT 302 09/16/2022      Chemistry      Component Value Date/Time   NA 146 09/16/2022 1509   NA 145 (H) 10/24/2015 1645   NA 144 12/31/2014 1450   K 4.6 09/16/2022 1509   K 3.6 12/31/2014 1450   CL 113 (H) 09/16/2022 1509   CL 113 (H) 12/31/2014 1450   CO2 26 09/16/2022 1509   CO2 21 12/31/2014 1450   BUN 10 09/16/2022 1509   BUN 11 10/24/2015 1645   BUN 17 12/31/2014 1450   CREATININE 1.02 09/16/2022 1509      Component Value Date/Time   CALCIUM 10.7 (H) 09/16/2022 1509   CALCIUM 9.4 12/31/2014 1450   ALKPHOS 66 08/05/2020 0533   ALKPHOS  88 05/03/2012 0903   AST 25 09/16/2022 1509   AST 30 05/03/2012 0903   ALT 25 09/16/2022 1509   ALT 25 05/03/2012 0903   BILITOT 0.5 09/16/2022 1509   BILITOT 0.4 07/21/2019 1535   BILITOT 0.6 05/03/2012 0903       Radiologic Imaging: 11/07/2022 CT C/P Narrative & Impression  CLINICAL DATA:  Abdominal pain, chronic (Ped 0-17y) Abdominal pain, chronic, epigastric   EXAM: CT ABDOMEN AND PELVIS WITH CONTRAST   TECHNIQUE: Multidetector CT imaging of the abdomen and pelvis was performed using the standard protocol following bolus administration of intravenous contrast.   RADIATION DOSE REDUCTION: This exam was performed according to the departmental dose-optimization program which includes automated exposure control, adjustment of the mA and/or kV according to patient size and/or use of iterative reconstruction technique.   CONTRAST:  18m OMNIPAQUE IOHEXOL 300 MG/ML  SOLN   COMPARISON:  07/26/2019   FINDINGS: Lower chest: No acute abnormality   Hepatobiliary: Prior cholecystectomy. No biliary ductal dilatation. Hypodensity in the inferior right hepatic lobe measures 7 mm, most compatible with cyst.   Pancreas: No focal abnormality or ductal dilatation.   Spleen: No focal abnormality.  Normal size.   Adrenals/Urinary Tract: No focal hepatic abnormality. Gallbladder unremarkable.   Stomach/Bowel: Stomach, large and small bowel grossly unremarkable.  Vascular/Lymphatic: No evidence of aneurysm or adenopathy.   Reproductive: Prior hysterectomy.  No adnexal masses.   Other: No free fluid or free air.   Musculoskeletal: No acute bony abnormality.   IMPRESSION: No acute findings in the abdomen or pelvis.     Electronically Signed   By: Rolm Baptise M.D.   On: 11/10/2022 20:37      Assessment:  Elizabeth Mata is a 61 y.o. female diagnosed with moderately differentiated vulvar cancer, the lesion was grossly resected. Reassuring imaging and exam.   H/o  hypercalcemia  Medical co-morbidities complicating care: h/o Carotid atherosclerosis, bilateral with TIA 2018; Hyperlipidemia and h/o seizures on therapy Plan:   Problem List Items Addressed This Visit       Genitourinary   Vulvar cancer ([place]) - Primary   Other Visit Diagnoses     Counseling and coordination of care           We discussed options for management including surgery. Based on her exam we identified the location of the lesion based on the 2 mm area of healing  and I have recommended another excision to determine if there is any residual disease and if so the depth of invasion and size. Given the lack of a gross lesion I do not recommend a more invasive procedure. We recommend exam under anesthesia colposcopy of the vulva and wide local excision.   Risks were discussed in detail. These include infection, anesthesia, bleeding, transfusion, wound separation, medical issues (blood clots, stroke, heart attack, fluid in the lungs, pneumonia, abnormal heart rhythm, death).  Consent signed.   Obtain CXR and CMP. Check with anesthesia regarding seizure history topiramate (TOPAMAX) 200 MG tablet dosing.   Suggested return to clinic in  4 - 6 weeks after surgery.     The patient's diagnosis, an outline of the further diagnostic and laboratory studies which will be required, the recommendation, and alternatives were discussed.  All questions were answered to the patient's satisfaction.  A total of 80 minutes were spent with the patient/family today; >50% was spent in education, counseling and coordination of care for vulvar cancer.    I personally had a face to face interaction and evaluated the patient jointly with the NP, Ms. Beckey Rutter.  I have reviewed her history and available records and have performed the key portions of the physical exam including lymph node survey, abdominal exam, pelvic exam with my findings confirming those documented above by the APP.  I have discussed  the case with the APP and the patient.  I agree with the above documentation, assessment and plan which was fully formulated by me.  Counseling was completed by me.   I personally saw the patient and performed a substantive portion of this encounter in conjunction with the listed APP as documented above.  Mindie Rawdon Gaetana Michaelis, MD    CC:  Ardeth Perfect, [place]

## 2022-12-18 NOTE — Progress Notes (Signed)
Gynecologic Oncology Consult Visit   Referring Provider: Ardeth Perfect, PA   Chief Concern: SCC of vulva  Subjective:  Elizabeth Mata is a 61 y.o. female who is seen in consultation from [place], [place] [facility]) for newly diagnosed squamous cell carcinoma of the vulva.   61 y.o. G2P2 whose LMP was No LMP recorded. Patient has had a hysterectomy., presents today for enlarging labial lesion. She had noticed what she thought was ingrown hair for several months which then became painful and enlarging. She attempted home I&D then sought evaluation with gynecology.     12/03/2022 biopsy performed - Specimen was 0.3 x 0.3 x 0.2 cm fragment     Component 2 wk ago  SURGICAL PATHOLOGY SURGICAL PATHOLOGY CASE: [id number] PATIENT: Elizabeth Mata Surgical Pathology Report     Clinical History: Labial lesion (nt)     FINAL MICROSCOPIC DIAGNOSIS:  A. LABIA, RIGHT LESION, BIOPSY: - Moderately differentiated squamous cell carcinoma - Carcinoma is present at biopsy edge      11/10/2022 CT A/P IMPRESSION: No acute findings in the abdomen or pelvis.   HIV nonreactive 08/09/2019  She reports feeling well otherwise. Denies unintentional weight loss, lumps or bumps elsewhere. No changes in stool, pelvic pain.   Problem List: Patient Active Problem List   Diagnosis Date Noted   Vulvar cancer ([place]) 12/18/2022   Mixed hyperlipidemia 09/16/2022   Pelvic floor weakness in female 03/03/2020   Polyp of ascending colon    Carotid atherosclerosis, bilateral 11/06/2017   Hx of completed stroke 10/28/2017   Skin pigmentation disorder 07/24/2016   Allergic rhinitis 06/30/2016   Eczema 09/13/2015   Irritable bowel syndrome with constipation 09/13/2015   Seizures ([place]) 08/16/2015   Bickerstaff's migraine 07/06/2012    Past Medical History: Past Medical History:  Diagnosis Date   Allergic rhinitis 06/30/2016   Allergy    Carotid atherosclerosis, bilateral 11/06/2017   [place] Nov  2018   Cyst of breast, left, solitary    Flank pain    GERD (gastroesophageal reflux disease)    Hemorrhoid    Hemorrhoids    Hyperlipidemia    IFG (impaired fasting glucose) 07/18/2016   June 2017    Kidney stone on left side 10/24/2015   Kidney stones    Neuropathy 07/24/2016   Seizures ([place])    Shingles outbreak 02/03/2017   Stomach irritation    Syncope    Recurrent over the last 2 years. had workup at [facility] this year with cardiology and neurology. Had echo and 3-week event monitor that were unremarkable. Has had negative EEG. Echo in 11/11 at [facility] showed no significant abnormalities. Possibly vasovagal   TIA (transient ischemic attack) 10/28/2017   Trigger thumb     Past Surgical History: Past Surgical History:  Procedure Laterality Date   ABDOMINAL HYSTERECTOMY  40s   endometriosis, fibroid tumors; no cancer, partial   BREAST EXCISIONAL BIOPSY Left 1980'S   EXCISIONAL - NEG   BREAST SURGERY     CHOLECYSTECTOMY  31   COLONOSCOPY WITH PROPOFOL N/A 06/23/2019   Procedure: COLONOSCOPY WITH PROPOFOL;  Surgeon: Virgel Manifold, MD;  Location: [facility] ENDOSCOPY;  Service: Endoscopy;  Laterality: N/A;   COLONOSCOPY WITH PROPOFOL N/A 12/28/2020   Procedure: COLONOSCOPY WITH PROPOFOL;  Surgeon: Virgel Manifold, MD;  Location: [facility] ENDOSCOPY;  Service: Endoscopy;  Laterality: N/A;   ESOPHAGOGASTRODUODENOSCOPY (EGD) WITH PROPOFOL N/A 06/23/2019   Procedure: ESOPHAGOGASTRODUODENOSCOPY (EGD) WITH PROPOFOL;  Surgeon: Virgel Manifold, MD;  Location: [facility] ENDOSCOPY;  Service:  Endoscopy;  Laterality: N/A;   ESOPHAGOGASTRODUODENOSCOPY (EGD) WITH PROPOFOL N/A 12/28/2020   Procedure: ESOPHAGOGASTRODUODENOSCOPY (EGD) WITH PROPOFOL;  Surgeon: Virgel Manifold, MD;  Location: [facility] ENDOSCOPY;  Service: Endoscopy;  Laterality: N/A;   FOOT SURGERY Right    tendon release    Past Gynecologic History:  As per HPI  OB History:  OB History  Gravida Para Term Preterm AB Living  '2 2        2  '$ SAB IAB Ectopic Multiple Live Births               # Outcome Date GA Lbr Len/2nd Weight Sex Delivery Anes PTL Lv  2 Para           1 Para             Obstetric Comments  Menstrual age: 15    Age 1st Pregnancy: 26    Family History: Family History  Problem Relation Age of Onset   Coronary artery disease Maternal Uncle    Diabetes Mellitus II Mother    Arthritis Mother    Diabetes Maternal Uncle    Kidney cancer Father 63       died May 14, 2017   Kidney disease Father    Cancer Father        kidney and bladder   Arthritis Maternal Uncle    Cancer Maternal Grandmother        stomach?   Jaundice Maternal Grandmother    Stomach cancer Paternal Grandmother    Cancer Paternal Grandmother        stomach   Hypertension Sister    Stroke Maternal Grandfather    Diabetes Paternal Grandfather    Diabetes Sister    Hypertension Sister    Breast cancer Neg Hx    Bladder Cancer Neg Hx     Social History: Social History   Socioeconomic History   Marital status: Legally Separated    Spouse name: Not on file   Number of children: 2   Years of education: Not on file   Highest education level: GED or equivalent  Occupational History   Occupation: unemployed  Tobacco Use   Smoking status: Never    Passive exposure: Never   Smokeless tobacco: Never  Vaping Use   Vaping Use: Never used  Substance and Sexual Activity   Alcohol use: Yes    Comment: occasionally   Drug use: Never   Sexual activity: Yes    Partners: Male    Birth control/protection: Surgical    Comment: Hysterectomy  Other Topics Concern   Not on file  Social History Narrative   Regular exercise   Social Determinants of Health   Financial Resource Strain: Low Risk  (05/09/2022)   Overall Financial Resource Strain (CARDIA)    Difficulty of Paying Living Expenses: Not very hard  Food Insecurity: No Food Insecurity (12/18/2022)   Hunger Vital Sign    Worried About Running Out of Food in the  Last Year: Never true    Ran Out of Food in the Last Year: Never true  Transportation Needs: No Transportation Needs (12/18/2022)   PRAPARE - Hydrologist (Medical): No    Lack of Transportation (Non-Medical): No  Physical Activity: Sufficiently Active (05/09/2022)   Exercise Vital Sign    Days of Exercise per Week: 7 days    Minutes of Exercise per Session: 30 min  Stress: No Stress Concern Present (05/09/2022)   [place] -  Occupational Stress Questionnaire    Feeling of Stress : Not at all  Social Connections: Moderately Isolated (05/09/2022)   Social Connection and Isolation Panel [NHANES]    Frequency of Communication with Friends and Family: More than three times a week    Frequency of Social Gatherings with Friends and Family: Three times a week    Attends Religious Services: More than 4 times per year    Active Member of Clubs or Organizations: No    Attends Archivist Meetings: Never    Marital Status: Separated  Intimate Partner Violence: Not At Risk (12/18/2022)   Humiliation, Afraid, Rape, and Kick questionnaire    Fear of Current or Ex-Partner: No    Emotionally Abused: No    Physically Abused: No    Sexually Abused: No    Allergies: Allergies  Allergen Reactions   Clindamycin/Lincomycin Hives   Hydrocodone Nausea Only   Metronidazole Hives   Sulfa Antibiotics Hives    Break out in hives all over   Oxycodone Nausea And Vomiting    Current Medications: Current Outpatient Medications  Medication Sig Dispense Refill   aspirin EC 81 MG EC tablet Take 1 tablet (81 mg total) by mouth daily. 30 tablet 1   atorvastatin (LIPITOR) 80 MG tablet TAKE 1 TABLET BY MOUTH EVERY DAY 90 tablet 0   ezetimibe (ZETIA) 10 MG tablet Take 1 tablet (10 mg total) by mouth daily. 90 tablet 3   fexofenadine (ALLEGRA) 180 MG tablet Take 180 mg by mouth daily.     topiramate (TOPAMAX) 200 MG tablet Take 200 mg by mouth 2  (two) times daily.   11   cholestyramine (QUESTRAN) 4 g packet Take 1 packet (4 g total) by mouth 2 (two) times daily. For loose stools. (Patient not taking: Reported on 12/18/2022) 60 each 3   dicyclomine (BENTYL) 10 MG capsule Take 1 capsule (10 mg total) by mouth 4 (four) times daily. (Patient not taking: Reported on 12/18/2022) 120 capsule 2   famotidine (PEPCID) 20 MG tablet Take 1 tablet (20 mg total) by mouth at bedtime. (Patient not taking: Reported on 12/18/2022) 90 tablet 0   gabapentin (NEURONTIN) 100 MG capsule Take 300 mg by mouth at bedtime. (Patient not taking: Reported on 12/18/2022)     loperamide (IMODIUM A-D) 2 MG tablet Take 1 tablet (2 mg total) by mouth as needed for diarrhea or loose stools. (Patient not taking: Reported on 12/18/2022) 30 tablet 0   omeprazole (PRILOSEC) 40 MG capsule Take 1 capsule (40 mg total) by mouth in the morning and at bedtime. (Patient not taking: Reported on 12/18/2022) 180 capsule 3   Rimegepant Sulfate (NURTEC) 75 MG TBDP Take by mouth. (Patient not taking: Reported on 12/18/2022)     UBRELVY 100 MG TABS Take 1 tablet by mouth as needed. (Patient not taking: Reported on 12/18/2022)     No current facility-administered medications for this visit.    Review of Systems General: negative for fevers, changes in weight or night sweats Skin: negative for changes in moles or sores or rash Eyes: negative for changes in vision HEENT: negative for change in hearing, tinnitus, voice changes Pulmonary: negative for dyspnea, orthopnea, productive cough, wheezing Cardiac: negative for palpitations, pain Gastrointestinal: negative for nausea, vomiting, constipation, diarrhea, hematemesis, hematochezia Genitourinary/Sexual: negative for dysuria, retention, hematuria, incontinence Ob/Gyn:  negative for abnormal bleeding, or pain; she still has some discomfort at the biopsy site Musculoskeletal: negative for pain, joint pain, back pain Hematology: negative for  easy bruising, abnormal bleeding Neurologic/Psych: negative for headaches, seizures, paralysis, weakness, numbness   Objective:  Physical Examination:  BP 119/86   Pulse 71   Temp 98 F (36.7 C) (Tympanic)   Resp 20   Ht 5' (1.524 m)   Wt 140 lb (63.5 kg)   BMI 27.34 kg/m    ECOG Performance Status: 1 - Symptomatic but completely ambulatory  GENERAL: Patient is a well appearing female in no acute distress. Accompanied by daughter HEENT:  PERRL, neck supple with midline trachea.  NODES:  No cervical, supraclavicular, axillary, or inguinal lymphadenopathy palpated.  LUNGS:  Clear to auscultation bilaterally.   HEART:  Regular rate and rhythm. ABDOMEN:  Soft, nontender, nondistended.  No masses ascites or hernias. EXTREMITIES:  No peripheral edema.   NEURO:  Nonfocal. Well oriented.  Appropriate affect.  Pelvic chaperoned by nurse;  Vulva: small 1-2 mm area of healing biopsy site o/w normal appearing vulva with no masses, tenderness or lesions. We measured the site based on landmarks ~3 cm lateral of the medial hair line and 5-6 cm inferior to the clitoral hood Vagina: normal vagina and cuff healing well. Cervix, Uterus: surgically absent.  Bimanual: normal, no masses        Lab Review Lab Results  Component Value Date   WBC 5.1 09/16/2022   HGB 13.9 09/16/2022   HCT 40.8 09/16/2022   MCV 92.1 09/16/2022   PLT 302 09/16/2022      Chemistry      Component Value Date/Time   NA 146 09/16/2022 1509   NA 145 (H) 10/24/2015 1645   NA 144 12/31/2014 1450   K 4.6 09/16/2022 1509   K 3.6 12/31/2014 1450   CL 113 (H) 09/16/2022 1509   CL 113 (H) 12/31/2014 1450   CO2 26 09/16/2022 1509   CO2 21 12/31/2014 1450   BUN 10 09/16/2022 1509   BUN 11 10/24/2015 1645   BUN 17 12/31/2014 1450   CREATININE 1.02 09/16/2022 1509      Component Value Date/Time   CALCIUM 10.7 (H) 09/16/2022 1509   CALCIUM 9.4 12/31/2014 1450   ALKPHOS 66 08/05/2020 0533   ALKPHOS 88  05/03/2012 0903   AST 25 09/16/2022 1509   AST 30 05/03/2012 0903   ALT 25 09/16/2022 1509   ALT 25 05/03/2012 0903   BILITOT 0.5 09/16/2022 1509   BILITOT 0.4 07/21/2019 1535   BILITOT 0.6 05/03/2012 0903       Radiologic Imaging: 11/07/2022 CT C/P Narrative & Impression  CLINICAL DATA:  Abdominal pain, chronic (Ped 0-17y) Abdominal pain, chronic, epigastric   EXAM: CT ABDOMEN AND PELVIS WITH CONTRAST   TECHNIQUE: Multidetector CT imaging of the abdomen and pelvis was performed using the standard protocol following bolus administration of intravenous contrast.   RADIATION DOSE REDUCTION: This exam was performed according to the departmental dose-optimization program which includes automated exposure control, adjustment of the mA and/or kV according to patient size and/or use of iterative reconstruction technique.   CONTRAST:  150m OMNIPAQUE IOHEXOL 300 MG/ML  SOLN   COMPARISON:  07/26/2019   FINDINGS: Lower chest: No acute abnormality   Hepatobiliary: Prior cholecystectomy. No biliary ductal dilatation. Hypodensity in the inferior right hepatic lobe measures 7 mm, most compatible with cyst.   Pancreas: No focal abnormality or ductal dilatation.   Spleen: No focal abnormality.  Normal size.   Adrenals/Urinary Tract: No focal hepatic abnormality. Gallbladder unremarkable.   Stomach/Bowel: Stomach, large and small bowel grossly unremarkable.   Vascular/Lymphatic:  No evidence of aneurysm or adenopathy.   Reproductive: Prior hysterectomy.  No adnexal masses.   Other: No free fluid or free air.   Musculoskeletal: No acute bony abnormality.   IMPRESSION: No acute findings in the abdomen or pelvis.     Electronically Signed   By: Rolm Baptise M.D.   On: 11/10/2022 20:37      Assessment:  Elizabeth Mata is a 61 y.o. female diagnosed with moderately differentiated vulvar cancer, the lesion was grossly resected. Reassuring imaging and exam.   H/o  hypercalcemia  Medical co-morbidities complicating care: h/o Carotid atherosclerosis, bilateral with TIA 2018; Hyperlipidemia and h/o seizures on therapy Plan:   Problem List Items Addressed This Visit       Genitourinary   Vulvar cancer ([place]) - Primary   Other Visit Diagnoses     Counseling and coordination of care           We discussed options for management including surgery. Based on her exam we identified the location of the lesion based on the 2 mm area of healing  and I have recommended another excision to determine if there is any residual disease and if so the depth of invasion and size. Given the lack of a gross lesion I do not recommend a more invasive procedure. We recommend exam under anesthesia colposcopy of the vulva and wide local excision.   Risks were discussed in detail. These include infection, anesthesia, bleeding, transfusion, wound separation, medical issues (blood clots, stroke, heart attack, fluid in the lungs, pneumonia, abnormal heart rhythm, death).  Consent signed.   Obtain CXR and CMP. Check with anesthesia regarding seizure history topiramate (TOPAMAX) 200 MG tablet dosing.   Suggested return to clinic in  4 - 6 weeks after surgery.     The patient's diagnosis, an outline of the further diagnostic and laboratory studies which will be required, the recommendation, and alternatives were discussed.  All questions were answered to the patient's satisfaction.  A total of 80 minutes were spent with the patient/family today; >50% was spent in education, counseling and coordination of care for vulvar cancer.    I personally had a face to face interaction and evaluated the patient jointly with the NP, Ms. Beckey Rutter.  I have reviewed her history and available records and have performed the key portions of the physical exam including lymph node survey, abdominal exam, pelvic exam with my findings confirming those documented above by the APP.  I have discussed  the case with the APP and the patient.  I agree with the above documentation, assessment and plan which was fully formulated by me.  Counseling was completed by me.   I personally saw the patient and performed a substantive portion of this encounter in conjunction with the listed APP as documented above.  Trinitie Mcgirr Gaetana Michaelis, MD    CC:  Ardeth Perfect, [place]

## 2022-12-19 ENCOUNTER — Telehealth: Payer: Self-pay

## 2022-12-19 NOTE — Progress Notes (Signed)
Met with patient to review pre-operative teaching for planned surgery scheduled for January 01, 2023. Pre-admit testing phone screen appointment discussed. Teaching included but not limited to labs, bowel prep and dietary restrictions, surgical location, pain scales/management, diligent peri care, guidelines to prevent constipation, and importance of early ambulation post operatively and follow-up care. Written instructions for pre-op and post-op care, contact information for questions and concerns provided to patient. Patient able to repeat instructions to nursing staff. No further questions at this time. Patient agrees with plan to proceed with scheduled procedure.

## 2022-12-19 NOTE — Telephone Encounter (Signed)
Called and notified Ms. Lacko with her pre admit testing and post operative appointments. Read back performed.

## 2022-12-24 ENCOUNTER — Encounter
Admission: RE | Admit: 2022-12-24 | Discharge: 2022-12-24 | Disposition: A | Discharge status: Home / Self Care | Payer: Medicare HMO | Source: Ambulatory Visit | Attending: Obstetrics and Gynecology | Admitting: Obstetrics and Gynecology

## 2022-12-24 NOTE — Patient Instructions (Addendum)
Your procedure is scheduled on: Wednesday January 01, 2023. Report to Day Surgery inside [facility] 2nd floor, stop by registration desk before getting on elevator. To find out your arrival time please call [phone] between 1PM - 3PM on Tuesday December 31, 2022.  Remember: Instructions that are not followed completely may result in serious medical risk,  up to and including death, or upon the discretion of your surgeon and anesthesiologist your  surgery may need to be rescheduled.     _X__ 1. Do not eat food or drink fluids after midnight the night before your procedure.                 No chewing gum or hard candies.  __X__2.  On the morning of surgery brush your teeth with toothpaste and water, you                may rinse your mouth with mouthwash if you wish.  Do not swallow any toothpaste or mouthwash.     _X__ 3.  No Alcohol for 24 hours before or after surgery.   _X__ 4.  Do Not Smoke or use e-cigarettes For 24 Hours Prior to Your Surgery.                 Do not use any chewable tobacco products for at least 6 hours prior to                 Surgery.  _X__  5.  Do not use any recreational drugs (marijuana, cocaine, heroin, ecstasy, MDMA or other)                For at least one week prior to your surgery.  Combination of these drugs with anesthesia                May have life threatening results.  ____  6.  Bring all medications with you on the day of surgery if instructed.   __X_ 7.  Notify your doctor if there is any change in your medical condition      (cold, fever, infections).     Do not wear jewelry, make-up, hairpins, clips or nail polish. Do not wear lotions, powders, or perfumes. You may wear deodorant. Do not shave 48 hours prior to surgery. Men may shave face and neck. Do not bring valuables to the hospital.    [facility] is not responsible for any belongings or valuables.  Contacts, dentures or bridgework may not be worn into  surgery. Leave your suitcase in the car. After surgery it may be brought to your room. For patients admitted to the hospital, discharge time is determined by your treatment team.   Patients discharged the day of surgery will not be allowed to drive home.   Make arrangements for someone to be with you for the first 24 hours of your Same Day Discharge.   __X__ Take these medicines the morning of surgery with A SIP OF WATER:    1. None   2.   3.   4.  5.  6.  ____ Fleet Enema (as directed)   ____ Use CHG Soap (or wipes) as directed  ____ Use Benzoyl Peroxide Gel as instructed  ____ Use inhalers on the day of surgery  ____ Stop metformin 2 days prior to surgery    ____ Take 1/2 of usual insulin dose the night before surgery. No insulin the morning  of surgery.   __X__ Stop aspirin 5 days prior to surgery.   __X__ One Week prior to surgery- Stop Anti-inflammatories such as Ibuprofen, Aleve, Advil, Motrin, meloxicam (MOBIC), diclofenac, etodolac, ketorolac, Toradol, Daypro, piroxicam, Goody's or BC powders. OK TO USE TYLENOL IF NEEDED   __X__ Stop supplements until after surgery.    ____ Bring C-Pap to the hospital.    If you have any questions regarding your pre-procedure instructions,  Please call Pre-admit Testing at [phone]

## 2022-12-25 ENCOUNTER — Ambulatory Visit
Admission: RE | Admit: 2022-12-25 | Discharge: 2022-12-25 | Disposition: A | Discharge status: Home / Self Care | Payer: Medicare HMO | Source: Ambulatory Visit | Attending: Obstetrics and Gynecology | Admitting: Obstetrics and Gynecology

## 2022-12-25 ENCOUNTER — Encounter
Admission: RE | Admit: 2022-12-25 | Discharge: 2022-12-25 | Disposition: A | Discharge status: Home / Self Care | Payer: Medicare HMO | Source: Ambulatory Visit | Attending: Obstetrics and Gynecology | Admitting: Obstetrics and Gynecology

## 2022-12-25 ENCOUNTER — Ambulatory Visit
Admission: RE | Admit: 2022-12-25 | Discharge: 2022-12-25 | Disposition: A | Discharge status: Home / Self Care | Payer: Medicare HMO | Attending: Obstetrics and Gynecology | Admitting: Obstetrics and Gynecology

## 2022-12-25 DIAGNOSIS — I10 Essential (primary) hypertension: Secondary | ICD-10-CM | POA: Insufficient documentation

## 2022-12-25 DIAGNOSIS — R001 Bradycardia, unspecified: Secondary | ICD-10-CM | POA: Insufficient documentation

## 2022-12-25 DIAGNOSIS — Z01812 Encounter for preprocedural laboratory examination: Secondary | ICD-10-CM

## 2022-12-25 DIAGNOSIS — C519 Malignant neoplasm of vulva, unspecified: Secondary | ICD-10-CM | POA: Insufficient documentation

## 2022-12-25 DIAGNOSIS — Z01810 Encounter for preprocedural cardiovascular examination: Secondary | ICD-10-CM | POA: Diagnosis not present

## 2022-12-25 DIAGNOSIS — Z7189 Other specified counseling: Secondary | ICD-10-CM | POA: Insufficient documentation

## 2022-12-25 DIAGNOSIS — Z01818 Encounter for other preprocedural examination: Secondary | ICD-10-CM | POA: Insufficient documentation

## 2022-12-25 LAB — TYPE AND SCREEN
ABO/RH(D): O POS
Antibody Screen: NEGATIVE

## 2022-12-27 ENCOUNTER — Encounter
Admission: RE | Disposition: A | Discharge status: Home / Self Care | Payer: Self-pay | Source: Home / Self Care | Attending: Gastroenterology

## 2022-12-27 ENCOUNTER — Ambulatory Visit
Admission: RE | Admit: 2022-12-27 | Discharge: 2022-12-27 | Disposition: A | Discharge status: Home / Self Care | Payer: Medicare HMO | Attending: Gastroenterology | Admitting: Gastroenterology

## 2022-12-27 ENCOUNTER — Ambulatory Visit: Payer: Medicare HMO | Admitting: Anesthesiology

## 2022-12-27 DIAGNOSIS — R1013 Epigastric pain: Secondary | ICD-10-CM

## 2022-12-27 DIAGNOSIS — Z8673 Personal history of transient ischemic attack (TIA), and cerebral infarction without residual deficits: Secondary | ICD-10-CM | POA: Diagnosis not present

## 2022-12-27 DIAGNOSIS — K2960 Other gastritis without bleeding: Secondary | ICD-10-CM | POA: Diagnosis not present

## 2022-12-27 DIAGNOSIS — N289 Disorder of kidney and ureter, unspecified: Secondary | ICD-10-CM | POA: Insufficient documentation

## 2022-12-27 DIAGNOSIS — R569 Unspecified convulsions: Secondary | ICD-10-CM | POA: Diagnosis not present

## 2022-12-27 DIAGNOSIS — K3189 Other diseases of stomach and duodenum: Secondary | ICD-10-CM | POA: Insufficient documentation

## 2022-12-27 DIAGNOSIS — Z823 Family history of stroke: Secondary | ICD-10-CM | POA: Diagnosis not present

## 2022-12-27 DIAGNOSIS — Z79899 Other long term (current) drug therapy: Secondary | ICD-10-CM | POA: Insufficient documentation

## 2022-12-27 DIAGNOSIS — K219 Gastro-esophageal reflux disease without esophagitis: Secondary | ICD-10-CM | POA: Insufficient documentation

## 2022-12-27 DIAGNOSIS — K2970 Gastritis, unspecified, without bleeding: Secondary | ICD-10-CM | POA: Diagnosis not present

## 2022-12-27 DIAGNOSIS — G8929 Other chronic pain: Secondary | ICD-10-CM

## 2022-12-27 DIAGNOSIS — E782 Mixed hyperlipidemia: Secondary | ICD-10-CM | POA: Diagnosis not present

## 2022-12-27 HISTORY — PX: ESOPHAGOGASTRODUODENOSCOPY (EGD) WITH PROPOFOL: SHX5813

## 2022-12-27 SURGERY — ESOPHAGOGASTRODUODENOSCOPY (EGD) WITH PROPOFOL
Anesthesia: General

## 2022-12-27 MED ORDER — LIDOCAINE HCL (CARDIAC) PF 100 MG/5ML IV SOSY
PREFILLED_SYRINGE | INTRAVENOUS | Status: DC | PRN
  Administered 2022-12-27: 50 mg via INTRAVENOUS

## 2022-12-27 MED ORDER — PROPOFOL 10 MG/ML IV BOLUS
INTRAVENOUS | Status: DC | PRN
  Administered 2022-12-27: 20 mg via INTRAVENOUS
  Administered 2022-12-27: 30 mg via INTRAVENOUS
  Administered 2022-12-27: 80 mg via INTRAVENOUS

## 2022-12-27 MED ORDER — DEXMEDETOMIDINE HCL IN NACL 200 MCG/50ML IV SOLN
INTRAVENOUS | Status: DC | PRN
  Administered 2022-12-27: 4 ug via INTRAVENOUS
  Administered 2022-12-27: 8 ug via INTRAVENOUS

## 2022-12-27 MED ORDER — LIDOCAINE HCL (PF) 2 % IJ SOLN
INTRAMUSCULAR | Status: AC
  Filled 2022-12-27: qty 5

## 2022-12-27 MED ORDER — PROPOFOL 10 MG/ML IV BOLUS
INTRAVENOUS | Status: AC
  Filled 2022-12-27: qty 20

## 2022-12-27 MED ORDER — PROPOFOL 1000 MG/100ML IV EMUL
INTRAVENOUS | Status: AC
  Filled 2022-12-27: qty 100

## 2022-12-27 MED ORDER — SODIUM CHLORIDE 0.9 % IV SOLN
INTRAVENOUS | Status: DC
  Administered 2022-12-27: 20 mL/h via INTRAVENOUS

## 2022-12-27 NOTE — Anesthesia Postprocedure Evaluation (Signed)
Anesthesia Post Note  Patient: Elizabeth Mata  Procedure(s) Performed: ESOPHAGOGASTRODUODENOSCOPY (EGD) WITH PROPOFOL  Patient location during evaluation: PACU Anesthesia Type: General Level of consciousness: awake and awake and alert Pain management: satisfactory to patient Vital Signs Assessment: post-procedure vital signs reviewed and stable Respiratory status: spontaneous breathing and nonlabored ventilation Cardiovascular status: stable Anesthetic complications: no  No notable events documented.   Last Vitals:  Vitals:   12/27/22 0752 12/27/22 0835  BP: 122/85 (!) 86/64  Pulse:  79  Resp: (!) 200 16  Temp: (!) 36.1 C (!) 35.7 C  SpO2: 97% 97%    Last Pain:  Vitals:   12/27/22 0855  TempSrc:   PainSc: 0-No pain                 VAN STAVEREN,Toyna Erisman

## 2022-12-27 NOTE — H&P (Signed)
Jonathon Bellows, MD [address], [place], [place], [place], [address] [address], [place], [place], [place], [address] Phone: [phone]  Fax: [phone]  Primary Care Physician:  Delsa Grana, PA-C   Pre-Procedure History & Physical: HPI:  Elizabeth Mata is a 61 y.o. female is here for an endoscopy    Past Medical History:  Diagnosis Date   Allergic rhinitis 06/30/2016   Allergy    Carotid atherosclerosis, bilateral 11/06/2017   [place] Nov 2018   Cyst of breast, left, solitary    Flank pain    GERD (gastroesophageal reflux disease)    Hemorrhoid    Hemorrhoids    Hyperlipidemia    IFG (impaired fasting glucose) 07/18/2016   June 2017    Kidney stone on left side 10/24/2015   Kidney stones    Neuropathy 07/24/2016   Seizures ([place])    Shingles outbreak 02/03/2017   Stomach irritation    Syncope    Recurrent over the last 2 years. had workup at [facility] this year with cardiology and neurology. Had echo and 3-week event monitor that were unremarkable. Has had negative EEG. Echo in 11/11 at [facility] showed no significant abnormalities. Possibly vasovagal   TIA (transient ischemic attack) 10/28/2017   Trigger thumb     Past Surgical History:  Procedure Laterality Date   ABDOMINAL HYSTERECTOMY  40s   endometriosis, fibroid tumors; no cancer, partial   BREAST EXCISIONAL BIOPSY Left 1980'S   EXCISIONAL - NEG   BREAST SURGERY     CHOLECYSTECTOMY  31   COLONOSCOPY WITH PROPOFOL N/A 06/23/2019   Procedure: COLONOSCOPY WITH PROPOFOL;  Surgeon: Virgel Manifold, MD;  Location: [facility] ENDOSCOPY;  Service: Endoscopy;  Laterality: N/A;   COLONOSCOPY WITH PROPOFOL N/A 12/28/2020   Procedure: COLONOSCOPY WITH PROPOFOL;  Surgeon: Virgel Manifold, MD;  Location: [facility] ENDOSCOPY;  Service: Endoscopy;  Laterality: N/A;   ESOPHAGOGASTRODUODENOSCOPY (EGD) WITH PROPOFOL N/A 06/23/2019   Procedure: ESOPHAGOGASTRODUODENOSCOPY (EGD) WITH PROPOFOL;  Surgeon: Virgel Manifold, MD;   Location: [facility] ENDOSCOPY;  Service: Endoscopy;  Laterality: N/A;   ESOPHAGOGASTRODUODENOSCOPY (EGD) WITH PROPOFOL N/A 12/28/2020   Procedure: ESOPHAGOGASTRODUODENOSCOPY (EGD) WITH PROPOFOL;  Surgeon: Virgel Manifold, MD;  Location: [facility] ENDOSCOPY;  Service: Endoscopy;  Laterality: N/A;   FOOT SURGERY Right    tendon release    Prior to Admission medications   Medication Sig Start Date End Date Taking? Authorizing Provider  aspirin EC 81 MG EC tablet Take 1 tablet (81 mg total) by mouth daily. 10/10/17  Yes Henreitta Leber, MD  atorvastatin (LIPITOR) 80 MG tablet TAKE 1 TABLET BY MOUTH EVERY DAY 07/29/22  Yes Delsa Grana, PA-C  cholestyramine (QUESTRAN) 4 g packet Take 1 packet (4 g total) by mouth 2 (two) times daily. For loose stools. Patient not taking: Reported on 12/18/2022 08/26/22   Jonathon Bellows, MD  dicyclomine (BENTYL) 10 MG capsule Take 1 capsule (10 mg total) by mouth 4 (four) times daily. Patient not taking: Reported on 12/18/2022 10/28/22   Jonathon Bellows, MD  ezetimibe (ZETIA) 10 MG tablet Take 1 tablet (10 mg total) by mouth daily. Patient not taking: Reported on 12/27/2022 09/23/22   Delsa Grana, PA-C  famotidine (PEPCID) 20 MG tablet Take 1 tablet (20 mg total) by mouth at bedtime. Patient not taking: Reported on 12/18/2022 08/14/21   Virgel Manifold, MD  fexofenadine (ALLEGRA) 180 MG tablet Take 180 mg by mouth daily.    [provider]  gabapentin (NEURONTIN) 100 MG  capsule Take 300 mg by mouth at bedtime. Patient not taking: Reported on 12/18/2022 04/06/21   [provider]  loperamide (IMODIUM A-D) 2 MG tablet Take 1 tablet (2 mg total) by mouth as needed for diarrhea or loose stools. Patient not taking: Reported on 12/18/2022 05/14/21   Virgel Manifold, MD  omeprazole (PRILOSEC) 40 MG capsule Take 1 capsule (40 mg total) by mouth in the morning and at bedtime. Patient not taking: Reported on 12/18/2022 08/26/22   Jonathon Bellows, MD  Rimegepant  Sulfate (NURTEC) 75 MG TBDP Take by mouth. Patient not taking: Reported on 12/18/2022 10/14/22 10/14/23  [provider]  topiramate (TOPAMAX) 200 MG tablet Take 200 mg by mouth 2 (two) times daily.  01/23/17   [provider]  UBRELVY 100 MG TABS Take 1 tablet by mouth as needed. Patient not taking: Reported on 12/18/2022 04/10/21   [provider]    Allergies as of 10/28/2022 - Review Complete 10/28/2022  Allergen Reaction Noted   Clindamycin/lincomycin Hives 11/10/2015   Hydrocodone Nausea Only 03/01/2015   Metronidazole Hives 11/07/2010   Sulfa antibiotics Hives 07/05/2014   Oxycodone Nausea And Vomiting 02/05/2017    Family History  Problem Relation Age of Onset   Coronary artery disease Maternal Uncle    Diabetes Mellitus II Mother    Arthritis Mother    Diabetes Maternal Uncle    Kidney cancer Father 46       died 2017/05/19   Kidney disease Father    Cancer Father        kidney and bladder   Arthritis Maternal Uncle    Cancer Maternal Grandmother        stomach?   Jaundice Maternal Grandmother    Stomach cancer Paternal Grandmother    Cancer Paternal Grandmother        stomach   Hypertension Sister    Stroke Maternal Grandfather    Diabetes Paternal Grandfather    Diabetes Sister    Hypertension Sister    Breast cancer Neg Hx    Bladder Cancer Neg Hx     Social History   Socioeconomic History   Marital status: Legally Separated    Spouse name: Not on file   Number of children: 2   Years of education: Not on file   Highest education level: GED or equivalent  Occupational History   Occupation: unemployed  Tobacco Use   Smoking status: Never    Passive exposure: Never   Smokeless tobacco: Never  Vaping Use   Vaping Use: Never used  Substance and Sexual Activity   Alcohol use: Yes    Comment: occasionally   Drug use: Never   Sexual activity: Yes    Partners: Male    Birth control/protection: Surgical    Comment:  Hysterectomy  Other Topics Concern   Not on file  Social History Narrative   Regular exercise   Social Determinants of Health   Financial Resource Strain: Low Risk  (05/09/2022)   Overall Financial Resource Strain (CARDIA)    Difficulty of Paying Living Expenses: Not very hard  Food Insecurity: No Food Insecurity (12/18/2022)   Hunger Vital Sign    Worried About Running Out of Food in the Last Year: Never true    Ran Out of Food in the Last Year: Never true  Transportation Needs: No Transportation Needs (12/18/2022)   PRAPARE - Hydrologist (Medical): No    Lack of Transportation (Non-Medical): No  Physical Activity: Sufficiently Active (05/09/2022)   Exercise Vital Sign    Days of Exercise per Week: 7 days    Minutes of Exercise per Session: 30 min  Stress: No Stress Concern Present (05/09/2022)   [place]    Feeling of Stress : Not at all  Social Connections: Moderately Isolated (05/09/2022)   Social Connection and Isolation Panel [NHANES]    Frequency of Communication with Friends and Family: More than three times a week    Frequency of Social Gatherings with Friends and Family: Three times a week    Attends Religious Services: More than 4 times per year    Active Member of Clubs or Organizations: No    Attends Archivist Meetings: Never    Marital Status: Separated  Intimate Partner Violence: Not At Risk (12/18/2022)   Humiliation, Afraid, Rape, and Kick questionnaire    Fear of Current or Ex-Partner: No    Emotionally Abused: No    Physically Abused: No    Sexually Abused: No    Review of Systems: See HPI, otherwise negative ROS  Physical Exam: BP 122/85   Temp (!) 96.9 F (36.1 C) (Temporal)   Resp (!) 200   Ht '4\' 11"'$  (1.499 m)   Wt 61.9 kg   SpO2 97%   BMI 27.55 kg/m  General:   Alert,  pleasant and cooperative in NAD Head:  Normocephalic and  atraumatic. Neck:  Supple; no masses or thyromegaly. Lungs:  Clear throughout to auscultation, normal respiratory effort.    Heart:  +S1, +S2, Regular rate and rhythm, No edema. Abdomen:  Soft, nontender and nondistended. Normal bowel sounds, without guarding, and without rebound.   Neurologic:  Alert and  oriented x4;  grossly normal neurologically.  Impression/Plan: Elizabeth Mata is here for an endoscopy  to be performed for  evaluation of abdominal opain     Risks, benefits, limitations, and alternatives regarding endoscopy have been reviewed with the patient.  Questions have been answered.  All parties agreeable.   Jonathon Bellows, MD  12/27/2022, 8:14 AM

## 2022-12-27 NOTE — Op Note (Signed)
[facility] Gastroenterology Patient Name: Elizabeth Mata Procedure Date: 12/27/2022 8:18 AM MRN: [id number] Account #: [id number] Date of Birth: 1961/03/01 Admit Type: Outpatient Age: 61 Room: [facility] ENDO ROOM 4 Gender: Female Note Status: Finalized Instrument Name: Upper Endoscope [id number] Procedure:             Upper GI endoscopy Indications:           Epigastric abdominal pain Providers:             Jonathon Bellows MD, MD Referring MD:          Delsa Grana (Referring MD) Medicines:             Monitored Anesthesia Care Complications:         No immediate complications. Procedure:             Pre-Anesthesia Assessment:                        - Prior to the procedure, a History and Physical was                         performed, and patient medications, allergies and                         sensitivities were reviewed. The patient's tolerance                         of previous anesthesia was reviewed.                        - The risks and benefits of the procedure and the                         sedation options and risks were discussed with the                         patient. All questions were answered and informed                         consent was obtained.                        - After reviewing the risks and benefits, the patient                         was deemed in satisfactory condition to undergo the                         procedure.                        - ASA Grade Assessment: II - A patient with mild                         systemic disease.                        After obtaining informed consent, the endoscope was                         passed under direct vision. Throughout  the procedure,                         the patient's blood pressure, pulse, and oxygen                         saturations were monitored continuously. The Endoscope                         was introduced through the mouth, and advanced to the                         third  part of duodenum. The upper GI endoscopy was                         accomplished with ease. The patient tolerated the                         procedure well. Findings:      The esophagus was normal.      The examined duodenum was normal.      Diffuse moderately erythematous mucosa without bleeding was found on the       greater curvature of the stomach, at the incisura, in the gastric antrum       and in the prepyloric region of the stomach. Biopsies were taken with a       cold forceps for histology.      The cardia and gastric fundus were normal on retroflexion. Impression:            - Normal esophagus.                        - Normal examined duodenum.                        - Erythematous mucosa in the greater curvature,                         incisura, antrum and prepyloric region of the stomach.                         Biopsied. Recommendation:        - Await pathology results.                        - Discharge patient to home (with escort).                        - Resume previous diet.                        - Continue present medications.                        - Return to my office as previously scheduled. Procedure Code(s):     --- Professional ---                        [phone], Esophagogastroduodenoscopy, flexible,                         transoral; with  biopsy, single or multiple Diagnosis Code(s):     --- Professional ---                        K31.89, Other diseases of stomach and duodenum                        R10.13, Epigastric pain CPT copyright 2022 American Medical Association. All rights reserved. The codes documented in this report are preliminary and upon coder review may  be revised to meet current compliance requirements. Jonathon Bellows, MD Jonathon Bellows MD, MD 12/27/2022 8:33:27 AM This report has been signed electronically. Number of Addenda: 0 Note Initiated On: 12/27/2022 8:18 AM Estimated Blood Loss:  Estimated blood loss: none.      [facility]

## 2022-12-27 NOTE — Transfer of Care (Signed)
Immediate Anesthesia Transfer of Care Note  Patient: Elizabeth Mata  Procedure(s) Performed: ESOPHAGOGASTRODUODENOSCOPY (EGD) WITH PROPOFOL  Patient Location: PACU and Endoscopy Unit  Anesthesia Type:General  Level of Consciousness: drowsy and patient cooperative  Airway & Oxygen Therapy: Patient Spontanous Breathing  Post-op Assessment: Report given to RN and Post -op Vital signs reviewed and stable  Post vital signs: Reviewed and stable  Last Vitals:  Vitals Value Taken Time  BP 86/64 12/27/22 0836  Temp 35.7 C 12/27/22 0835  Pulse 80 12/27/22 0838  Resp 10 12/27/22 0838  SpO2 97 % 12/27/22 0838  Vitals shown include unvalidated device data.  Last Pain:  Vitals:   12/27/22 0835  TempSrc: Temporal  PainSc:          Complications: No notable events documented.

## 2022-12-27 NOTE — Anesthesia Preprocedure Evaluation (Signed)
Anesthesia Evaluation  Patient identified by MRN, date of birth, ID band Patient awake    Reviewed: Allergy & Precautions, NPO status , Patient's Chart, lab work & pertinent test results  Airway Mallampati: II  TM Distance: >3 FB Neck ROM: Full    Dental  (+) Teeth Intact   Pulmonary neg pulmonary ROS   Pulmonary exam normal breath sounds clear to auscultation       Cardiovascular Exercise Tolerance: Good negative cardio ROS Normal cardiovascular exam Rhythm:Regular Rate:Normal     Neuro/Psych  Headaches, Seizures -, Well Controlled,  TIAnegative neurological ROS  negative psych ROS   GI/Hepatic negative GI ROS, Neg liver ROS,GERD  Medicated,,  Endo/Other  negative endocrine ROS    Renal/GU Renal InsufficiencyRenal diseasenegative Renal ROS  negative genitourinary   Musculoskeletal   Abdominal Normal abdominal exam  (+)   Peds negative pediatric ROS (+)  Hematology negative hematology ROS (+)   Anesthesia Other Findings Past Medical History: 06/30/2016: Allergic rhinitis No date: Allergy 11/06/2017: Carotid atherosclerosis, bilateral     Comment:  [place] Nov 2018 No date: Cyst of breast, left, solitary No date: Flank pain No date: GERD (gastroesophageal reflux disease) No date: Hemorrhoid No date: Hemorrhoids No date: Hyperlipidemia 07/18/2016: IFG (impaired fasting glucose)     Comment:  June 2017  10/24/2015: Kidney stone on left side No date: Kidney stones 07/24/2016: Neuropathy No date: Seizures ([place]) 02/03/2017: Shingles outbreak No date: Stomach irritation No date: Syncope     Comment:  Recurrent over the last 2 years. had workup at [facility] this               year with cardiology and neurology. Had echo and 3-week               event monitor that were unremarkable. Has had negative               EEG. Echo in 11/11 at [facility] showed no significant               abnormalities. Possibly vasovagal 10/28/2017: TIA  (transient ischemic attack) No date: Trigger thumb  Past Surgical History: 40s: ABDOMINAL HYSTERECTOMY     Comment:  endometriosis, fibroid tumors; no cancer, partial 1980'S: BREAST EXCISIONAL BIOPSY; Left     Comment:  EXCISIONAL - NEG No date: BREAST SURGERY 31: CHOLECYSTECTOMY 06/23/2019: COLONOSCOPY WITH PROPOFOL; N/A     Comment:  Procedure: COLONOSCOPY WITH PROPOFOL;  Surgeon:               Virgel Manifold, MD;  Location: [facility] ENDOSCOPY;                Service: Endoscopy;  Laterality: N/A; 12/28/2020: COLONOSCOPY WITH PROPOFOL; N/A     Comment:  Procedure: COLONOSCOPY WITH PROPOFOL;  Surgeon:               Virgel Manifold, MD;  Location: [facility] ENDOSCOPY;                Service: Endoscopy;  Laterality: N/A; 06/23/2019: ESOPHAGOGASTRODUODENOSCOPY (EGD) WITH PROPOFOL; N/A     Comment:  Procedure: ESOPHAGOGASTRODUODENOSCOPY (EGD) WITH               PROPOFOL;  Surgeon: Virgel Manifold, MD;  Location:               [facility] ENDOSCOPY;  Service: Endoscopy;  Laterality: N/A; 12/28/2020: ESOPHAGOGASTRODUODENOSCOPY (EGD) WITH PROPOFOL; N/A     Comment:  Procedure: ESOPHAGOGASTRODUODENOSCOPY (EGD) WITH  PROPOFOL;  Surgeon: Virgel Manifold, MD;  Location:               [facility] ENDOSCOPY;  Service: Endoscopy;  Laterality: N/A; No date: FOOT SURGERY; Right     Comment:  tendon release     Reproductive/Obstetrics negative OB ROS                             Anesthesia Physical Anesthesia Plan  ASA: 3  Anesthesia Plan: General   Post-op Pain Management:    Induction: Intravenous  PONV Risk Score and Plan: Propofol infusion and TIVA  Airway Management Planned: Natural Airway  Additional Equipment:   Intra-op Plan:   Post-operative Plan:   Informed Consent: I have reviewed the patients History and Physical, chart, labs and discussed the procedure including the risks, benefits and alternatives for the proposed anesthesia with  the patient or authorized representative who has indicated his/her understanding and acceptance.     Dental Advisory Given  Plan Discussed with: CRNA and Surgeon  Anesthesia Plan Comments:        Anesthesia Quick Evaluation

## 2022-12-28 ENCOUNTER — Encounter: Payer: Self-pay | Admitting: Gastroenterology

## 2022-12-31 LAB — SURGICAL PATHOLOGY

## 2022-12-31 MED ORDER — CHLORHEXIDINE GLUCONATE 0.12 % MT SOLN: 15.0000 mL | Freq: Once | OROMUCOSAL | Status: AC

## 2022-12-31 MED ORDER — LACTATED RINGERS IV SOLN: INTRAVENOUS | Status: DC

## 2022-12-31 MED ORDER — CEFAZOLIN SODIUM-DEXTROSE 2-4 GM/100ML-% IV SOLN
2.0000 g | INTRAVENOUS | Status: AC
  Administered 2023-01-01: 2 g via INTRAVENOUS

## 2022-12-31 MED ORDER — ORAL CARE MOUTH RINSE: 15.0000 mL | Freq: Once | OROMUCOSAL | Status: AC

## 2022-12-31 MED ORDER — BUPIVACAINE LIPOSOME 1.3 % IJ SUSP: 10.0000 mL | Freq: Once | INTRAMUSCULAR | Status: DC

## 2022-12-31 MED ORDER — FAMOTIDINE 20 MG PO TABS: 20.0000 mg | ORAL_TABLET | Freq: Once | ORAL | Status: AC

## 2022-12-31 MED ORDER — HEPARIN SODIUM (PORCINE) 5000 UNIT/ML IJ SOLN: 5000.0000 [IU] | INTRAMUSCULAR | Status: AC

## 2023-01-01 ENCOUNTER — Ambulatory Visit: Payer: Medicare HMO | Admitting: Anesthesiology

## 2023-01-01 ENCOUNTER — Encounter: Payer: Self-pay | Admitting: Obstetrics and Gynecology

## 2023-01-01 ENCOUNTER — Other Ambulatory Visit: Payer: Self-pay

## 2023-01-01 ENCOUNTER — Ambulatory Visit
Admission: RE | Admit: 2023-01-01 | Discharge: 2023-01-01 | Disposition: A | Discharge status: Home / Self Care | Payer: Medicare HMO | Source: Ambulatory Visit | Attending: Obstetrics and Gynecology | Admitting: Obstetrics and Gynecology

## 2023-01-01 ENCOUNTER — Encounter
Admission: RE | Disposition: A | Discharge status: Home / Self Care | Payer: Self-pay | Source: Ambulatory Visit | Attending: Obstetrics and Gynecology

## 2023-01-01 DIAGNOSIS — C519 Malignant neoplasm of vulva, unspecified: Secondary | ICD-10-CM | POA: Insufficient documentation

## 2023-01-01 DIAGNOSIS — E785 Hyperlipidemia, unspecified: Secondary | ICD-10-CM | POA: Insufficient documentation

## 2023-01-01 DIAGNOSIS — G459 Transient cerebral ischemic attack, unspecified: Secondary | ICD-10-CM | POA: Diagnosis not present

## 2023-01-01 DIAGNOSIS — N898 Other specified noninflammatory disorders of vagina: Secondary | ICD-10-CM | POA: Diagnosis not present

## 2023-01-01 DIAGNOSIS — K219 Gastro-esophageal reflux disease without esophagitis: Secondary | ICD-10-CM | POA: Insufficient documentation

## 2023-01-01 DIAGNOSIS — E782 Mixed hyperlipidemia: Secondary | ICD-10-CM | POA: Diagnosis not present

## 2023-01-01 DIAGNOSIS — D071 Carcinoma in situ of vulva: Secondary | ICD-10-CM | POA: Diagnosis not present

## 2023-01-01 HISTORY — PX: VULVECTOMY PARTIAL: SHX6187

## 2023-01-01 LAB — ABO/RH: ABO/RH(D): O POS

## 2023-01-01 SURGERY — VULVECTOMY, PARTIAL
Anesthesia: General

## 2023-01-01 MED ORDER — FENTANYL CITRATE (PF) 100 MCG/2ML IJ SOLN
INTRAMUSCULAR | Status: AC
  Filled 2023-01-01: qty 2

## 2023-01-01 MED ORDER — PROPOFOL 500 MG/50ML IV EMUL
INTRAVENOUS | Status: DC | PRN
  Administered 2023-01-01: 80 ug/kg/min via INTRAVENOUS

## 2023-01-01 MED ORDER — PROPOFOL 10 MG/ML IV BOLUS
INTRAVENOUS | Status: AC
  Filled 2023-01-01: qty 20

## 2023-01-01 MED ORDER — DEXMEDETOMIDINE HCL IN NACL 200 MCG/50ML IV SOLN
INTRAVENOUS | Status: DC | PRN
  Administered 2023-01-01: 8 ug via INTRAVENOUS
  Administered 2023-01-01: 4 ug via INTRAVENOUS

## 2023-01-01 MED ORDER — TRAMADOL HCL 50 MG PO TABS: 50.0000 mg | ORAL_TABLET | Freq: Four times a day (QID) | ORAL | Status: DC | PRN

## 2023-01-01 MED ORDER — LIDOCAINE HCL (PF) 2 % IJ SOLN
INTRAMUSCULAR | Status: AC
  Filled 2023-01-01: qty 5

## 2023-01-01 MED ORDER — CEFAZOLIN SODIUM-DEXTROSE 2-4 GM/100ML-% IV SOLN
INTRAVENOUS | Status: AC
  Filled 2023-01-01: qty 100

## 2023-01-01 MED ORDER — BUPIVACAINE HCL (PF) 0.5 % IJ SOLN
INTRAMUSCULAR | Status: AC
  Filled 2023-01-01: qty 30

## 2023-01-01 MED ORDER — ACETIC ACID 3 % SOLN
Status: DC | PRN
  Administered 2023-01-01: 1 via TOPICAL

## 2023-01-01 MED ORDER — FENTANYL CITRATE (PF) 100 MCG/2ML IJ SOLN: 25.0000 ug | INTRAMUSCULAR | Status: DC | PRN

## 2023-01-01 MED ORDER — FENTANYL CITRATE (PF) 100 MCG/2ML IJ SOLN
INTRAMUSCULAR | Status: DC | PRN
  Administered 2023-01-01: 50 ug via INTRAVENOUS
  Administered 2023-01-01 (×2): 25 ug via INTRAVENOUS

## 2023-01-01 MED ORDER — DEXMEDETOMIDINE HCL IN NACL 80 MCG/20ML IV SOLN
INTRAVENOUS | Status: AC
  Filled 2023-01-01: qty 20

## 2023-01-01 MED ORDER — PROPOFOL 10 MG/ML IV BOLUS
INTRAVENOUS | Status: DC | PRN
  Administered 2023-01-01: 50 mg via INTRAVENOUS

## 2023-01-01 MED ORDER — HEPARIN SODIUM (PORCINE) 5000 UNIT/ML IJ SOLN
INTRAMUSCULAR | Status: AC
  Administered 2023-01-01: 5000 [IU] via SUBCUTANEOUS
  Filled 2023-01-01: qty 1

## 2023-01-01 MED ORDER — EPHEDRINE SULFATE (PRESSORS) 50 MG/ML IJ SOLN
INTRAMUSCULAR | Status: DC | PRN
  Administered 2023-01-01 (×3): 5 mg via INTRAVENOUS

## 2023-01-01 MED ORDER — 0.9 % SODIUM CHLORIDE (POUR BTL) OPTIME
TOPICAL | Status: DC | PRN
  Administered 2023-01-01: 100 mL

## 2023-01-01 MED ORDER — BUPIVACAINE LIPOSOME 1.3 % IJ SUSP
INTRAMUSCULAR | Status: AC
  Filled 2023-01-01: qty 20

## 2023-01-01 MED ORDER — ONDANSETRON HCL 4 MG/2ML IJ SOLN: 4.0000 mg | Freq: Once | INTRAMUSCULAR | Status: DC | PRN

## 2023-01-01 MED ORDER — CHLORHEXIDINE GLUCONATE 0.12 % MT SOLN
OROMUCOSAL | Status: AC
  Administered 2023-01-01: 15 mL via OROMUCOSAL
  Filled 2023-01-01: qty 15

## 2023-01-01 MED ORDER — BUPIVACAINE HCL (PF) 0.25 % IJ SOLN
INTRAMUSCULAR | Status: DC | PRN
  Administered 2023-01-01: 5 mL

## 2023-01-01 MED ORDER — ACETIC ACID 3 % SOLN
Status: AC
  Filled 2023-01-01: qty 500

## 2023-01-01 MED ORDER — BUPIVACAINE LIPOSOME 1.3 % IJ SUSP
INTRAMUSCULAR | Status: DC | PRN
  Administered 2023-01-01: 6 mL

## 2023-01-01 MED ORDER — FAMOTIDINE 20 MG PO TABS
ORAL_TABLET | ORAL | Status: AC
  Administered 2023-01-01: 20 mg via ORAL
  Filled 2023-01-01: qty 1

## 2023-01-01 SURGICAL SUPPLY — 40 items
BASIN GRAD PLASTIC 32OZ STRL (MISCELLANEOUS) IMPLANT
BLADE SURG 15 STRL LF DISP TIS (BLADE) ×1 IMPLANT
BLADE SURG 15 STRL SS (BLADE) ×1
BLADE SURG SZ10 CARB STEEL (BLADE) ×1 IMPLANT
CATH ROBINSON RED A/P 16FR (CATHETERS) ×1 IMPLANT
CNTNR SPEC 2.5X3XGRAD LEK (MISCELLANEOUS) ×1
CONT SPEC 4OZ STRL OR WHT (MISCELLANEOUS) ×1
CONTAINER SPEC 2.5X3XGRAD LEK (MISCELLANEOUS) ×1 IMPLANT
DRAPE 3/4 80X56 (DRAPES) ×1 IMPLANT
DRAPE PERI LITHO V/GYN (MISCELLANEOUS) ×1 IMPLANT
DRAPE UNDER BUTTOCK W/FLU (DRAPES) ×1 IMPLANT
DRSG TELFA 3X8 NADH STRL (GAUZE/BANDAGES/DRESSINGS) ×1 IMPLANT
ELECT CAUTERY BLADE 6.4 (BLADE) ×1 IMPLANT
ELECT CAUTERY NEEDLE TIP 1.0 (MISCELLANEOUS) ×1
ELECT REM PT RETURN 9FT ADLT (ELECTROSURGICAL) ×1
ELECTRODE CAUTERY NEDL TIP 1.0 (MISCELLANEOUS) ×1 IMPLANT
ELECTRODE REM PT RTRN 9FT ADLT (ELECTROSURGICAL) ×1 IMPLANT
GAUZE 4X4 16PLY ~~LOC~~+RFID DBL (SPONGE) ×2 IMPLANT
GLOVE BIO SURGEON STRL SZ 6.5 (GLOVE) ×1 IMPLANT
GLOVE SURG UNDER LTX SZ7 (GLOVE) ×1 IMPLANT
GOWN STRL REUS W/ TWL LRG LVL3 (GOWN DISPOSABLE) ×2 IMPLANT
GOWN STRL REUS W/TWL LRG LVL3 (GOWN DISPOSABLE) ×2
MANIFOLD NEPTUNE II (INSTRUMENTS) ×1 IMPLANT
MARKER SKIN DUAL TIP RULER LAB (MISCELLANEOUS) ×1 IMPLANT
NEEDLE HYPO 22GX1.5 SAFETY (NEEDLE) ×1 IMPLANT
NS IRRIG 500ML POUR BTL (IV SOLUTION) ×1 IMPLANT
PACK BASIN MINOR ARMC (MISCELLANEOUS) ×1 IMPLANT
PAD OB MATERNITY 4.3X12.25 (PERSONAL CARE ITEMS) ×1 IMPLANT
PAD PREP 24X41 OB/GYN DISP (PERSONAL CARE ITEMS) ×1 IMPLANT
SOL PREP PVP 2OZ (MISCELLANEOUS) ×1
SOLUTION PREP PVP 2OZ (MISCELLANEOUS) ×1 IMPLANT
SUT VIC AB 2-0 SH 27 (SUTURE) ×1
SUT VIC AB 2-0 SH 27XBRD (SUTURE) ×1 IMPLANT
SUT VIC AB 3-0 SH 27 (SUTURE) ×1
SUT VIC AB 3-0 SH 27X BRD (SUTURE) ×1 IMPLANT
SUT VIC AB 4-0 SH 27 (SUTURE) ×1
SUT VIC AB 4-0 SH 27XANBCTRL (SUTURE) IMPLANT
SYR CONTROL 10ML LL (SYRINGE) ×1 IMPLANT
TRAP FLUID SMOKE EVACUATOR (MISCELLANEOUS) ×1 IMPLANT
WATER STERILE IRR 500ML POUR (IV SOLUTION) ×1 IMPLANT

## 2023-01-01 NOTE — Interval H&P Note (Signed)
History and Physical Interval Note:  01/01/2023 12:39 PM  Elizabeth Mata  has presented today for surgery, with the diagnosis of Vulvar cancer.  The various methods of treatment have been discussed with the patient and family. After consideration of risks, benefits and other options for treatment, the patient has consented to  Procedure(s): EXAM UNDER ANESTHESIA,VULVECTOMY PARTIAL, COLPOSCOPY (N/A) as a surgical intervention.  The patient's history has been reviewed, patient examined, no change in status, stable for surgery.  I have reviewed the patient's chart and labs.  Questions were answered to the patient's satisfaction.     [place]

## 2023-01-01 NOTE — Discharge Instructions (Signed)

## 2023-01-01 NOTE — Op Note (Signed)
  Operative Note   02/28/2016 10:25 AM  PRE-OP DIAGNOSIS: Stage I invasive moderately differentiated squamous vulvar cancer, right labia   POST-OP DIAGNOSIS: Same  SURGEON: Surgeon(s) and Role:    * Makel Mcmann Gaetana Michaelis, MD - Primary  ASSISTANT: None  ANESTHESIA: General   PROCEDURE: Procedure(s): EXAM UNDER ANESTHESIA VULVAR COLPOSCOPY WIDE LOCAL VULVAR EXCISION  ESTIMATED BLOOD LOSS: Minimal  DRAINS: None   TOTAL IV FLUIDS: See anesthesia notes  SPECIMENS:  Vulvar excisional biopsy 12 o'clock stitch  COMPLICATIONS: None  DISPOSITION: PACU - hemodynamically stable.  CONDITION: stable  INDICATIONS: Biopsy with diagnosis of invasive vulvar cancer. Gross lesion was not visualized on exam at consultation visit.   PROCEDURE IN DETAIL: FINDINGS: Vulvar: No lesions present on gross exam or colposcopy. On colposcopy slightly raised epithelium, no AWE and corresponding area of thickened tissue at similar location to healing biopsy site noted in clinic; Vagina: normal vagina; BME: no masses; Uterus and Cervix: surgically absent. No inguinal adenopathy   PROCEDURE IN DETAIL: After informed consent was obtained, the patient was taken to the operating room where anesthesia was obtained without difficulty. The patient was positioned in the dorsal lithotomy position in [place] and her arms were carefully tucked at her sides and the usual precautions were taken.  She was prepped and draped in normal sterile fashion.  Time-out was performed and a I&O catheter was placed into the bladder.   Colposcopy was performed with above noted findings  The vulva was infiltrated with 5 cc of 0.5% Marcaine without epinephrine. The area of interest on the right vulva was outlined. The skin was incised with a scapel circumferentially and the dermis and subcutaneous tissue was transected using cauterization.  The specimen was removed entirely and a stitch was placed at 12 o'clock.   The defect  was approximately 3 by 1.5 cm. The subcutaneous tissue was reapproximated with interrupted 3-0 Vicryl. The skin was reapproximated with 4-0 Vicryl in a running fashion. The wound was intact with sutures without tension. Hemostasis was observed. Approximately 7 cc of Exparel was injected.   The patient tolerated the procedure well.  Sponge, lap and needle counts were correct x2.  The patient was taken to recovery room in excellent condition.  Antibiotics: Given 1st or 2nd generation cephalosporin, Antibiotics given within 1 hour of the start of the procedure, Antibiotics ordered to be discontinued within 24 hours post procedure"  VTE prophylaxis: was ordered perioperatively.   Gillis Ends, MD

## 2023-01-01 NOTE — Anesthesia Preprocedure Evaluation (Signed)
Anesthesia Evaluation  Patient identified by MRN, date of birth, ID band Patient awake    Reviewed: Allergy & Precautions, NPO status , Patient's Chart, lab work & pertinent test results  History of Anesthesia Complications (+) PONV and history of anesthetic complications  Airway Mallampati: II  TM Distance: >3 FB Neck ROM: Full    Dental  (+) Teeth Intact, Dental Advidsory Given, Partial Upper, Partial Lower, Missing   Pulmonary neg pulmonary ROS   Pulmonary exam normal breath sounds clear to auscultation       Cardiovascular Exercise Tolerance: Good negative cardio ROS Normal cardiovascular exam Rhythm:Regular Rate:Normal     Neuro/Psych  Headaches, Seizures -, Well Controlled,  TIAnegative neurological ROS  negative psych ROS   GI/Hepatic negative GI ROS, Neg liver ROS,GERD  Medicated,,  Endo/Other  negative endocrine ROS    Renal/GU Renal InsufficiencyRenal diseasenegative Renal ROS  negative genitourinary   Musculoskeletal   Abdominal Normal abdominal exam  (+)   Peds negative pediatric ROS (+)  Hematology negative hematology ROS (+)   Anesthesia Other Findings Past Medical History: 06/30/2016: Allergic rhinitis No date: Allergy 11/06/2017: Carotid atherosclerosis, bilateral     Comment:  [place] Nov 2018 No date: Cyst of breast, left, solitary No date: Flank pain No date: GERD (gastroesophageal reflux disease) No date: Hemorrhoid No date: Hemorrhoids No date: Hyperlipidemia 07/18/2016: IFG (impaired fasting glucose)     Comment:  June 2017  10/24/2015: Kidney stone on left side No date: Kidney stones 07/24/2016: Neuropathy No date: Seizures ([place]) 02/03/2017: Shingles outbreak No date: Stomach irritation No date: Syncope     Comment:  Recurrent over the last 2 years. had workup at [facility] this               year with cardiology and neurology. Had echo and 3-week               event monitor that were  unremarkable. Has had negative               EEG. Echo in 11/11 at [facility] showed no significant               abnormalities. Possibly vasovagal 10/28/2017: TIA (transient ischemic attack) No date: Trigger thumb  Past Surgical History: 40s: ABDOMINAL HYSTERECTOMY     Comment:  endometriosis, fibroid tumors; no cancer, partial 1980'S: BREAST EXCISIONAL BIOPSY; Left     Comment:  EXCISIONAL - NEG No date: BREAST SURGERY 31: CHOLECYSTECTOMY 06/23/2019: COLONOSCOPY WITH PROPOFOL; N/A     Comment:  Procedure: COLONOSCOPY WITH PROPOFOL;  Surgeon:               Virgel Manifold, MD;  Location: [facility] ENDOSCOPY;                Service: Endoscopy;  Laterality: N/A; 12/28/2020: COLONOSCOPY WITH PROPOFOL; N/A     Comment:  Procedure: COLONOSCOPY WITH PROPOFOL;  Surgeon:               Virgel Manifold, MD;  Location: [facility] ENDOSCOPY;                Service: Endoscopy;  Laterality: N/A; 06/23/2019: ESOPHAGOGASTRODUODENOSCOPY (EGD) WITH PROPOFOL; N/A     Comment:  Procedure: ESOPHAGOGASTRODUODENOSCOPY (EGD) WITH               PROPOFOL;  Surgeon: Virgel Manifold, MD;  Location:               [facility] ENDOSCOPY;  Service: Endoscopy;  Laterality:  N/A; 12/28/2020: ESOPHAGOGASTRODUODENOSCOPY (EGD) WITH PROPOFOL; N/A     Comment:  Procedure: ESOPHAGOGASTRODUODENOSCOPY (EGD) WITH               PROPOFOL;  Surgeon: Virgel Manifold, MD;  Location:               [facility] ENDOSCOPY;  Service: Endoscopy;  Laterality: N/A; No date: FOOT SURGERY; Right     Comment:  tendon release     Reproductive/Obstetrics negative OB ROS                             Anesthesia Physical Anesthesia Plan  ASA: 3  Anesthesia Plan: General   Post-op Pain Management:    Induction: Intravenous  PONV Risk Score and Plan: 4 or greater and Propofol infusion and TIVA  Airway Management Planned: Natural Airway and Simple Face Mask  Additional Equipment:   Intra-op Plan:   Post-operative  Plan:   Informed Consent: I have reviewed the patients History and Physical, chart, labs and discussed the procedure including the risks, benefits and alternatives for the proposed anesthesia with the patient or authorized representative who has indicated his/her understanding and acceptance.     Dental Advisory Given  Plan Discussed with: CRNA and Surgeon  Anesthesia Plan Comments:        Anesthesia Quick Evaluation

## 2023-01-01 NOTE — Progress Notes (Signed)
I called the patient and reviewed operative findings. I answered the patient's questions accordingly. She is doing well. She has no pain symptoms. I reviewed that we did an Exparel injection and that should help control the pain. She can use Tylenol as needed for pain. We discussed that we do try to avoid narcotics. If she is having pain we can explore other options.. Follow up in clinic for postoperative exam. She will contact us if she has any questions or concerns.  Gillis Ends, MD

## 2023-01-01 NOTE — Transfer of Care (Signed)
Immediate Anesthesia Transfer of Care Note  Patient: Elizabeth Mata  Procedure(s) Performed: Jasmine December UNDER ANESTHESIA,VULVECTOMY PARTIAL, COLPOSCOPY  Patient Location: PACU  Anesthesia Type:General  Level of Consciousness: drowsy and patient cooperative  Airway & Oxygen Therapy: Patient Spontanous Breathing  Post-op Assessment: Report given to RN and Post -op Vital signs reviewed and stable  Post vital signs: Reviewed and stable  Last Vitals:  Vitals Value Taken Time  BP 124/73 01/01/23 1430  Temp 36.1 C 01/01/23 1428  Pulse 50 01/01/23 1437  Resp 15 01/01/23 1437  SpO2 98 % 01/01/23 1437  Vitals shown include unvalidated device data.  Last Pain:  Vitals:   01/01/23 1428  TempSrc:   PainSc: Asleep         Complications: No notable events documented.

## 2023-01-02 ENCOUNTER — Encounter: Payer: Self-pay | Admitting: Obstetrics and Gynecology

## 2023-01-02 NOTE — Anesthesia Postprocedure Evaluation (Signed)
Anesthesia Post Note  Patient: Elizabeth Mata  Procedure(s) Performed: EXAM UNDER ANESTHESIA,VULVECTOMY PARTIAL, COLPOSCOPY  Patient location during evaluation: PACU Anesthesia Type: General Level of consciousness: awake Pain management: pain level controlled Vital Signs Assessment: post-procedure vital signs reviewed and stable Respiratory status: nonlabored ventilation Cardiovascular status: stable Anesthetic complications: no  No notable events documented.   Last Vitals:  Vitals:   01/01/23 1515 01/01/23 1535  BP: 120/72 (!) 110/56  Pulse: (!) 49 (!) 56  Resp: 10 15  Temp: (!) 36.2 C (!) 36.4 C  SpO2: 99% 100%    Last Pain:  Vitals:   01/01/23 1535  TempSrc: Temporal  PainSc: 0-No pain                 VAN STAVEREN,Ariyel Jeangilles

## 2023-01-03 LAB — SURGICAL PATHOLOGY

## 2023-01-09 ENCOUNTER — Telehealth: Payer: Self-pay

## 2023-01-09 NOTE — Telephone Encounter (Signed)
Spoke with Elizabeth Mata and provided her the results of pathology from her surgery. She will keep her post operative appointment as scheduled.

## 2023-01-15 NOTE — Telephone Encounter (Signed)
I reviewed pathology findings. I answered the patient's questions accordingly. She is doing well.  Follow up in clinic for postoperative exam. She will contact us if she has any questions or concerns.  Gillis Ends, MD    Pathology DIAGNOSIS:   A. VULVA, RIGHT; BIOPSY:  - DERMAL FIBROSIS, CONSISTENT WITH PREVIOUS BIOPSY SITE.  - NO RESIDUAL SQUAMOUS CELL CARCINOMA IDENTIFIED (REF OUTSIDE PATHOLOGY  REPORT; [phone]).

## 2023-01-30 ENCOUNTER — Other Ambulatory Visit: Payer: Self-pay | Admitting: Gastroenterology

## 2023-01-30 ENCOUNTER — Other Ambulatory Visit: Payer: Self-pay | Admitting: Family Medicine

## 2023-01-30 DIAGNOSIS — E782 Mixed hyperlipidemia: Secondary | ICD-10-CM

## 2023-01-30 DIAGNOSIS — I6523 Occlusion and stenosis of bilateral carotid arteries: Secondary | ICD-10-CM

## 2023-01-31 ENCOUNTER — Ambulatory Visit: Payer: Medicare HMO | Admitting: Dietician

## 2023-02-03 ENCOUNTER — Telehealth: Payer: Self-pay | Admitting: Gastroenterology

## 2023-02-03 ENCOUNTER — Encounter: Payer: Self-pay | Admitting: Gastroenterology

## 2023-02-03 ENCOUNTER — Ambulatory Visit: Payer: Medicare HMO | Admitting: Gastroenterology

## 2023-02-03 NOTE — Telephone Encounter (Signed)
Patient called to reschedule her appointment for today because she is not feeling to good. States she had a surgery recently.

## 2023-02-04 ENCOUNTER — Encounter: Payer: Self-pay | Admitting: Gastroenterology

## 2023-02-05 ENCOUNTER — Inpatient Hospital Stay: Payer: Medicare HMO | Attending: Obstetrics and Gynecology | Admitting: Obstetrics and Gynecology

## 2023-02-05 ENCOUNTER — Encounter: Payer: Self-pay | Admitting: Obstetrics and Gynecology

## 2023-02-05 VITALS — BP 118/83 | HR 79 | Temp 98.7°F | Resp 20 | Wt 138.1 lb

## 2023-02-05 DIAGNOSIS — C519 Malignant neoplasm of vulva, unspecified: Secondary | ICD-10-CM

## 2023-02-05 DIAGNOSIS — Z9889 Other specified postprocedural states: Secondary | ICD-10-CM

## 2023-02-05 NOTE — Progress Notes (Signed)
Gynecologic Oncology Consult Visit   Referring Provider: Ardeth Perfect, PA   Chief Concern: SCC of vulva  Subjective:  Elizabeth Mata is a 62 y.o. female who is seen in consultation from [place], [facility]) for newly diagnosed squamous cell carcinoma of the vulva, s/p WLE with Dr. Theora Gianotti on 01/01/23, who returns to clinic for post-op and discussion of results.   Here for post op visit.  Still has some tenderness in vulva at surgical site.  Incision well healed though.  01/01/23- EUA, colposcopy, and WLE of vulva. Lesion visualized during colposcopy. Defect was approximately 3 x 1.5 cm. Stitch placed at 12:00.   Pathology:  A. VULVA, RIGHT; BIOPSY:  - DERMAL FIBROSIS, CONSISTENT WITH PREVIOUS BIOPSY SITE.  - NO RESIDUAL SQUAMOUS CELL CARCINOMA IDENTIFIED (REF OUTSIDE PATHOLOGY  REPORT; [phone]).   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   Gynecologic Oncology History:  62 y.o. G2P2 whose LMP was No LMP recorded. Patient has had a hysterectomy., presents today for enlarging labial lesion. She had noticed what she thought was ingrown hair for several months which then became painful and enlarging. She attempted home I&D then sought evaluation with gynecology.     12/03/2022 biopsy performed - Specimen was 0.3 x 0.3 x 0.2 cm fragment  PATHOLOGY: A. LABIA, RIGHT LESION, BIOPSY: - Moderately differentiated squamous cell carcinoma - Carcinoma is present at biopsy edge  11/10/2022 CT A/P IMPRESSION: No acute findings in the abdomen or pelvis.   HIV nonreactive 08/09/2019  No gross lesion identified on initial exam with Dr. Theora Gianotti.     Problem List: Patient Active Problem List   Diagnosis Date Noted   Vulvar cancer ([place]) 12/18/2022   Mixed hyperlipidemia 09/16/2022   Pelvic floor weakness in female 03/03/2020   Polyp of ascending colon    Abdominal pain, chronic, epigastric    Carotid atherosclerosis,  bilateral 11/06/2017   Hx of completed stroke 10/28/2017   Skin pigmentation disorder 07/24/2016   Allergic rhinitis 06/30/2016   Eczema 09/13/2015   Irritable bowel syndrome with constipation 09/13/2015   Seizures ([place]) 08/16/2015   Bickerstaff's migraine 07/06/2012    Past Medical History: Past Medical History:  Diagnosis Date   Allergic rhinitis 06/30/2016   Allergy    Carotid atherosclerosis, bilateral 11/06/2017   [place] Nov 2018   Cyst of breast, left, solitary    Flank pain    GERD (gastroesophageal reflux disease)    Hemorrhoid    Hemorrhoids    Hyperlipidemia    IFG (impaired fasting glucose) 07/18/2016   June 2017    Kidney stone on left side 10/24/2015   Kidney stones    Neuropathy 07/24/2016   Seizures ([place])    Shingles outbreak 02/03/2017   Stomach irritation    Syncope    Recurrent over the last 2 years. had workup at [facility] this year with cardiology and neurology. Had echo and 3-week event monitor that were unremarkable. Has had negative EEG. Echo in 11/11 at [facility] showed no significant abnormalities. Possibly vasovagal   TIA (transient ischemic attack) 10/28/2017   Trigger thumb     Past Surgical History: Past Surgical History:  Procedure Laterality Date   ABDOMINAL HYSTERECTOMY  40s   endometriosis, fibroid tumors; no cancer, partial   BREAST EXCISIONAL BIOPSY Left 1980'S   EXCISIONAL - NEG   BREAST SURGERY     CHOLECYSTECTOMY  31   COLONOSCOPY WITH PROPOFOL N/A 06/23/2019   Procedure: COLONOSCOPY WITH PROPOFOL;  Surgeon: Virgel Manifold, MD;  Location: [facility] ENDOSCOPY;  Service: Endoscopy;  Laterality: N/A;   COLONOSCOPY WITH PROPOFOL N/A 12/28/2020   Procedure: COLONOSCOPY WITH PROPOFOL;  Surgeon: Virgel Manifold, MD;  Location: [facility] ENDOSCOPY;  Service: Endoscopy;  Laterality: N/A;   ESOPHAGOGASTRODUODENOSCOPY (EGD) WITH PROPOFOL N/A 06/23/2019   Procedure: ESOPHAGOGASTRODUODENOSCOPY (EGD) WITH PROPOFOL;  Surgeon: Virgel Manifold, MD;  Location:  [facility] ENDOSCOPY;  Service: Endoscopy;  Laterality: N/A;   ESOPHAGOGASTRODUODENOSCOPY (EGD) WITH PROPOFOL N/A 12/28/2020   Procedure: ESOPHAGOGASTRODUODENOSCOPY (EGD) WITH PROPOFOL;  Surgeon: Virgel Manifold, MD;  Location: [facility] ENDOSCOPY;  Service: Endoscopy;  Laterality: N/A;   ESOPHAGOGASTRODUODENOSCOPY (EGD) WITH PROPOFOL N/A 12/27/2022   Procedure: ESOPHAGOGASTRODUODENOSCOPY (EGD) WITH PROPOFOL;  Surgeon: Jonathon Bellows, MD;  Location: [facility] ENDOSCOPY;  Service: Gastroenterology;  Laterality: N/A;   FOOT SURGERY Right    tendon release   VULVECTOMY PARTIAL N/A 01/01/2023   Procedure: EXAM UNDER ANESTHESIA,VULVECTOMY PARTIAL, COLPOSCOPY;  Surgeon: Gillis Ends, MD;  Location: [facility] ORS;  Service: Gynecology;  Laterality: N/A;    Past Gynecologic History:  As per HPI  OB History:  OB History  Gravida Para Term Preterm AB Living  '2 2       2  '$ SAB IAB Ectopic Multiple Live Births               # Outcome Date GA Lbr Len/2nd Weight Sex Delivery Anes PTL Lv  2 Para           1 Para             Obstetric Comments  Menstrual age: 72    Age 1st Pregnancy: 5    Family History: Family History  Problem Relation Age of Onset   Coronary artery disease Maternal Uncle    Diabetes Mellitus II Mother    Arthritis Mother    Diabetes Maternal Uncle    Kidney cancer Father 41       died May 08, 2017   Kidney disease Father    Cancer Father        kidney and bladder   Arthritis Maternal Uncle    Cancer Maternal Grandmother        stomach?   Jaundice Maternal Grandmother    Stomach cancer Paternal Grandmother    Cancer Paternal Grandmother        stomach   Hypertension Sister    Stroke Maternal Grandfather    Diabetes Paternal Grandfather    Diabetes Sister    Hypertension Sister    Breast cancer Neg Hx    Bladder Cancer Neg Hx     Social History: Social History   Socioeconomic History   Marital status: Legally Separated    Spouse name: Not on file   Number of  children: 2   Years of education: Not on file   Highest education level: GED or equivalent  Occupational History   Occupation: unemployed  Tobacco Use   Smoking status: Never    Passive exposure: Never   Smokeless tobacco: Never  Vaping Use   Vaping Use: Never used  Substance and Sexual Activity   Alcohol use: Yes    Comment: occasionally   Drug use: Never   Sexual activity: Yes    Partners: Male    Birth control/protection: Surgical    Comment: Hysterectomy  Other Topics Concern   Not on file  Social History Narrative   Regular exercise   Social Determinants of Health   Financial Resource Strain: Low Risk  (05/09/2022)  Overall Financial Resource Strain (CARDIA)    Difficulty of Paying Living Expenses: Not very hard  Food Insecurity: No Food Insecurity (12/18/2022)   Hunger Vital Sign    Worried About Running Out of Food in the Last Year: Never true    Ran Out of Food in the Last Year: Never true  Transportation Needs: No Transportation Needs (12/18/2022)   PRAPARE - Hydrologist (Medical): No    Lack of Transportation (Non-Medical): No  Physical Activity: Sufficiently Active (05/09/2022)   Exercise Vital Sign    Days of Exercise per Week: 7 days    Minutes of Exercise per Session: 30 min  Stress: No Stress Concern Present (05/09/2022)   [place]    Feeling of Stress : Not at all  Social Connections: Moderately Isolated (05/09/2022)   Social Connection and Isolation Panel [NHANES]    Frequency of Communication with Friends and Family: More than three times a week    Frequency of Social Gatherings with Friends and Family: Three times a week    Attends Religious Services: More than 4 times per year    Active Member of Clubs or Organizations: No    Attends Archivist Meetings: Never    Marital Status: Separated  Intimate Partner Violence: Not At Risk (12/18/2022)    Humiliation, Afraid, Rape, and Kick questionnaire    Fear of Current or Ex-Partner: No    Emotionally Abused: No    Physically Abused: No    Sexually Abused: No    Allergies: Allergies  Allergen Reactions   Clindamycin/Lincomycin Hives   Hydrocodone Nausea Only   Metronidazole Hives   Sulfa Antibiotics Hives    Break out in hives all over   Oxycodone Nausea And Vomiting    Current Medications: Current Outpatient Medications  Medication Sig Dispense Refill   aspirin EC 81 MG EC tablet Take 1 tablet (81 mg total) by mouth daily. 30 tablet 1   atorvastatin (LIPITOR) 80 MG tablet TAKE 1 TABLET BY MOUTH EVERY DAY 30 tablet 0   fexofenadine (ALLEGRA) 180 MG tablet Take 180 mg by mouth daily.     topiramate (TOPAMAX) 200 MG tablet Take 200 mg by mouth 2 (two) times daily.   11   No current facility-administered medications for this visit.    Review of Systems General:  no complaints Skin: no complaints Eyes: no complaints HEENT: no complaints Breasts: no complaints Pulmonary: no complaints Cardiac: no complaints Gastrointestinal: no complaints Genitourinary/Sexual: no complaints Ob/Gyn: vulvar irritation Musculoskeletal: no complaints Hematology: no complaints Neurologic/Psych: no complaints   Objective:  Physical Examination:  Wt 138 lb 1.6 oz (62.6 kg)   BMI 27.89 kg/m    ECOG Performance Status: 1 - Symptomatic but completely ambulatory  GENERAL: Patient is a well appearing female in no acute distress.  NODES:  No cervical, supraclavicular, axillary, or inguinal lymphadenopathy palpated.  LUNGS:  Clear to auscultation bilaterally.   HEART:  Regular rate and rhythm. EXTREMITIES:  No peripheral edema.   NEURO:  Nonfocal. Well oriented.  Appropriate affect.  Pelvic chaperoned by CMA Vulva: well healed right vulvar incision Vagina: normal vagina Cervix, Uterus: surgically absent.  Bimanual: normal, no masses    Lab Review Per hpi  Radiologic Imaging: Per  HPI     Assessment:  Elizabeth Mata is a 62 y.o. female diagnosed with moderately differentiated invasive squamous cell cancer of vulvar on right side, the lesion was grossly  resected. Reassuring imaging and exam.  01/01/23- EUA, colposcopy, and WLE of vulva. No residual dysplasia or cancer in the pathology specimen.   Here for post op visit.  Still has some tenderness in vulva at surgical site.  Incision well healed though.  H/o hypercalcemia  Medical co-morbidities complicating care: h/o Carotid atherosclerosis, bilateral with TIA 2018; Hyperlipidemia and h/o seizures on therapy Plan:   Problem List Items Addressed This Visit       Genitourinary   Vulvar cancer ([place]) - Primary   We discussed that no further treatment is recommended in view of the lack of residual disease in the surgical specimen.  The vulvar lesion was very small and the biopsy was only 2 x 3 x 2 mm.  In view of this will have the original slides reviewed in our tumor board to see if she really had invasive cancer, as opposed to VIN.    Return to clinic in  6 months.    The patient's diagnosis, an outline of the further diagnostic and laboratory studies which will be required, the recommendation, and alternatives were discussed.  All questions were answered to the patient's satisfaction.  I personally interviewed and examined the patient. Agreed with the above/below plan of care. I have directly contributed to assessment and plan of care of this patient and educated and discussed with patient and family.  Mellody Drown, MD   CC:  Ardeth Perfect, PA

## 2023-02-25 ENCOUNTER — Ambulatory Visit: Payer: Medicare HMO | Admitting: Gastroenterology

## 2023-02-25 ENCOUNTER — Encounter: Payer: Self-pay | Admitting: Gastroenterology

## 2023-02-25 NOTE — Progress Notes (Incomplete)
Jonathon Bellows MD, MRCP(U.K) [address]  [place]  [place], [place] [address]  Main: [phone]  Fax: [phone]   Primary Care Physician: Delsa Grana, PA-C  Primary Gastroenterologist:  Dr. Jonathon Bellows   No chief complaint on file.   HPI: Elizabeth Mata is a 62 y.o. female   Summary of history :   Previously seen by Dr Bonna Gains last in 07/2021 for heartburn,loose stools and weight gain. They were stable at last office visit . Transferred care to me on 08/26/2022 . At that time stated she developed epigastric pain ongoing at least for a year if not longer.  Dull in nature usually worse after she eats.  Not relieved with a bowel movement can last hours.  Nonradiating.  Denies any constipation to having diarrhea for few years.  Has a bowel movement right after she eats.  Has been taking meloxicam for years.  Stopped taking it a few days prior .  Not on any PPI.  Denies any other NSAIDs.  Denies any weight loss.She was also complaining of chronic diarrhea likely bile salt mediated/.    11/2020: EGD: Reactive gastropathy . Colonoscopy no evidence of microscopic colitis.   08/26/2022:H pylori breath test negative.  08/2022: Stool negative for c diff but GI PCR not performed by lab for unknown reason.    Interval history 10/28/2022-02/25/2023   12/27/2022: Each day: Normal study except for erythematous mucosa in the greater curvature the stomach biopsies were taken that showed erosive gastritis.   Since last visit no significant diarrhea.  Continues to have epigastric pain.  Colonoscopy reviewed some gas and bloating.  Denies any NSAID use. Taking her PPI.  Stopped taking meloxicam.     Current Outpatient Medications  Medication Sig Dispense Refill   aspirin EC 81 MG EC tablet Take 1 tablet (81 mg total) by mouth daily. 30 tablet 1   atorvastatin (LIPITOR) 80 MG tablet TAKE 1 TABLET BY MOUTH EVERY DAY 30 tablet 0   fexofenadine (ALLEGRA) 180 MG tablet Take 180 mg by mouth  daily.     topiramate (TOPAMAX) 200 MG tablet Take 200 mg by mouth 2 (two) times daily.   11   No current facility-administered medications for this visit.    Allergies as of 02/25/2023 - Review Complete 02/05/2023  Allergen Reaction Noted   Clindamycin/lincomycin Hives 11/10/2015   Hydrocodone Nausea Only 03/01/2015   Metronidazole Hives 11/07/2010   Sulfa antibiotics Hives 07/05/2014   Oxycodone Nausea And Vomiting 02/05/2017    ROS:  General: Negative for anorexia, weight loss, fever, chills, fatigue, weakness. ENT: Negative for hoarseness, difficulty swallowing , nasal congestion. CV: Negative for chest pain, angina, palpitations, dyspnea on exertion, peripheral edema.  Respiratory: Negative for dyspnea at rest, dyspnea on exertion, cough, sputum, wheezing.  GI: See history of present illness. GU:  Negative for dysuria, hematuria, urinary incontinence, urinary frequency, nocturnal urination.  Endo: Negative for unusual weight change.    Physical Examination:   There were no vitals taken for this visit.  General: Well-nourished, well-developed in no acute distress.  Eyes: No icterus. Conjunctivae pink. Mouth: Oropharyngeal mucosa moist and pink , no lesions erythema or exudate. Lungs: Clear to auscultation bilaterally. Non-labored. Heart: Regular rate and rhythm, no murmurs rubs or gallops.  Abdomen: Bowel sounds are normal, nontender, nondistended, no hepatosplenomegaly or masses, no abdominal bruits or hernia , no rebound or guarding.   Extremities: No lower extremity edema. No clubbing or deformities. Neuro: Alert and oriented x 3.  Grossly intact. Skin: Warm and dry, no jaundice.   Psych: Alert and cooperative, normal mood and affect.   Imaging Studies: No results found.  Assessment and Plan:   Elizabeth Mata is a 63 y.o. y/o female here to follow up for abdominal pain.  History of long-term use of meloxicam.  She has since stopped after her last visit and has  been on a PPI despite which continues to have epigastric discomfort on and off.  She had bile salt mediated diarrhea which has resolved with Questran.   Plan 1.    Bentyl 10 mg 4 times daily as needed for pain.  Dr Jonathon Bellows  MD,MRCP [facility]) Follow up in ***  BP check ***

## 2023-02-26 ENCOUNTER — Other Ambulatory Visit: Payer: Self-pay | Admitting: Physician Assistant

## 2023-02-26 DIAGNOSIS — E782 Mixed hyperlipidemia: Secondary | ICD-10-CM

## 2023-02-26 DIAGNOSIS — I6523 Occlusion and stenosis of bilateral carotid arteries: Secondary | ICD-10-CM

## 2023-02-26 NOTE — Telephone Encounter (Signed)
Requested Prescriptions  Pending Prescriptions Disp Refills   atorvastatin (LIPITOR) 80 MG tablet [Pharmacy Med Name: ATORVASTATIN 80 MG TABLET] 90 tablet 1    Sig: TAKE 1 TABLET BY MOUTH EVERY DAY     Cardiovascular:  Antilipid - Statins Failed - 02/26/2023  8:30 AM      Failed - Lipid Panel in normal range within the last 12 months    Cholesterol  Date Value Ref Range Status  09/16/2022 208 (H) <200 mg/dL Final   LDL Cholesterol (Calc)  Date Value Ref Range Status  09/16/2022 103 (H) mg/dL (calc) Final    Comment:    Reference range: <100 . Desirable range <100 mg/dL for primary prevention;   <70 mg/dL for patients with CHD or diabetic patients  with > or = 2 CHD risk factors. Marland Kitchen LDL-C is now calculated using the Martin-Hopkins  calculation, which is a validated novel method providing  better accuracy than the Friedewald equation in the  estimation of LDL-C.  Cresenciano Genre et al. Annamaria Helling. [id number]): 2061-2068  (http://education.[facility].com/faq/FAQ164)    HDL  Date Value Ref Range Status  09/16/2022 91 > OR = 50 mg/dL Final   Triglycerides  Date Value Ref Range Status  09/16/2022 53 <150 mg/dL Final         Passed - Patient is not pregnant      Passed - Valid encounter within last 12 months    Recent Outpatient Visits           5 months ago Breast pain, left   [facility] Delsa Grana, PA-C   11 months ago Rash of face   [facility] Delsa Grana, PA-C   1 year ago Chronic pain of left knee   [facility] Teodora Medici, DO   1 year ago Mixed hyperlipidemia   [facility] Delsa Grana, PA-C   2 years ago [facility] Delsa Grana, [place]       Future Appointments             In 2 weeks Delsa Grana, [facility], [facility]

## 2023-02-27 ENCOUNTER — Telehealth: Payer: Self-pay | Admitting: Family Medicine

## 2023-02-27 NOTE — Telephone Encounter (Signed)
  Called patient to schedule Medicare Annual Wellness Visit (AWV). Left message for patient to call back and schedule Medicare Annual Wellness Visit (AWV).  Last date of AWV: 05/09/2022  Please schedule an appointment at any time with [facility].  If any questions, please contact me at [phone].  Thank you ,  [place] Direct Dial: [phone]

## 2023-03-13 ENCOUNTER — Ambulatory Visit: Payer: Medicare HMO | Admitting: Dietician

## 2023-03-17 ENCOUNTER — Encounter: Payer: Self-pay | Admitting: Family Medicine

## 2023-03-17 ENCOUNTER — Ambulatory Visit (INDEPENDENT_AMBULATORY_CARE_PROVIDER_SITE_OTHER): Payer: Medicare HMO | Admitting: Family Medicine

## 2023-03-17 VITALS — BP 118/80 | HR 65 | Temp 97.9°F | Resp 16 | Ht 60.0 in | Wt 138.9 lb

## 2023-03-17 DIAGNOSIS — K219 Gastro-esophageal reflux disease without esophagitis: Secondary | ICD-10-CM | POA: Diagnosis not present

## 2023-03-17 DIAGNOSIS — G43109 Migraine with aura, not intractable, without status migrainosus: Secondary | ICD-10-CM | POA: Diagnosis not present

## 2023-03-17 DIAGNOSIS — R1013 Epigastric pain: Secondary | ICD-10-CM | POA: Diagnosis not present

## 2023-03-17 DIAGNOSIS — E782 Mixed hyperlipidemia: Secondary | ICD-10-CM

## 2023-03-17 DIAGNOSIS — Z8673 Personal history of transient ischemic attack (TIA), and cerebral infarction without residual deficits: Secondary | ICD-10-CM | POA: Diagnosis not present

## 2023-03-17 DIAGNOSIS — Z5181 Encounter for therapeutic drug level monitoring: Secondary | ICD-10-CM

## 2023-03-17 DIAGNOSIS — K581 Irritable bowel syndrome with constipation: Secondary | ICD-10-CM | POA: Diagnosis not present

## 2023-03-17 DIAGNOSIS — R569 Unspecified convulsions: Secondary | ICD-10-CM | POA: Diagnosis not present

## 2023-03-17 DIAGNOSIS — C519 Malignant neoplasm of vulva, unspecified: Secondary | ICD-10-CM

## 2023-03-17 DIAGNOSIS — G8929 Other chronic pain: Secondary | ICD-10-CM

## 2023-03-17 DIAGNOSIS — I6523 Occlusion and stenosis of bilateral carotid arteries: Secondary | ICD-10-CM

## 2023-03-17 DIAGNOSIS — Z7902 Long term (current) use of antithrombotics/antiplatelets: Secondary | ICD-10-CM | POA: Diagnosis not present

## 2023-03-17 MED ORDER — OMEPRAZOLE 40 MG PO CPDR: 40.0000 mg | DELAYED_RELEASE_CAPSULE | Freq: Every day | ORAL | 3 refills | Status: DC | PRN

## 2023-03-17 MED ORDER — EZETIMIBE 10 MG PO TABS: 10.0000 mg | ORAL_TABLET | Freq: Every day | ORAL | 3 refills | Status: DC

## 2023-03-17 MED ORDER — ATORVASTATIN CALCIUM 80 MG PO TABS: 80.0000 mg | ORAL_TABLET | Freq: Every day | ORAL | 3 refills | Status: DC

## 2023-03-17 NOTE — Progress Notes (Signed)
Name: Elizabeth Mata   MRN: [id number]    DOB: Nov 23, 1961   Date:03/17/2023       Progress Note  Chief Complaint  Patient presents with   Follow-up   Hyperlipidemia     Subjective:   Elizabeth Mata is a 62 y.o. female, presents to clinic for routine f/up  HLD - was poorly controlled - we attempted to add zetia and refer to nutritionist as well as give handouts for diet/lifestyle efforts and recommendations for improving cholesterol She was due for recheck of labs - hoping to have improved LDL Lab Results  Component Value Date   CHOL 208 (H) 09/16/2022   HDL 91 09/16/2022   LDLCALC 103 (H) 09/16/2022   TRIG 53 09/16/2022   CHOLHDL 2.3 09/16/2022  She is on lipitor 80 mg and is taking zetia (its off chart - will add back on) Hx of bilateral atherosclerosis   Migraines/seizures - managed with neurology - Domenick Gong ([place]) - was trying to get nurtec approved and is on topimax (multiple other meds tried in the past) Dx on last neuro visit: Migraine without aura and without status migrainosus, not intractable (Primary Dx);  Bickerstaff's migraine;  Lacunar stroke (CMS-HCC);  Occlusion and stenosis of unspecified carotid artery;  Carotid atherosclerosis, bilateral;  Essential tremor  03/2022 - reviewed  Abd pain - worked with GI - did EGD recently, last colonoscopy 2021- biopsy and EGD showed some improving gastritis - I cannot see what the plan was after procedure completed - she had stopped meloxicam use and was on PPI, pepcid, bentyl prn, and cholestyramine prn for diarrhea  Recent vulvar SCC cancer dx, - s/p WLE with Dr. 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  Clean margins noted on last f/up with specialist 02/05/2023   Current Outpatient Medications:    aspirin EC 81 MG EC tablet, Take 1 tablet (81 mg total) by mouth daily., Disp: 30 tablet, Rfl: 1   fexofenadine (ALLEGRA) 180 MG tablet, Take 180 mg by mouth  daily., Disp: , Rfl:    NURTEC 75 MG TBDP, Take 1 tablet by mouth as directed., Disp: , Rfl:    omeprazole (PRILOSEC) 40 MG capsule, Take 1 capsule (40 mg total) by mouth daily as needed (epigastric pain, reflux, indigestion)., Disp: 30 capsule, Rfl: 3   topiramate (TOPAMAX) 200 MG tablet, Take 200 mg by mouth 2 (two) times daily. , Disp: , Rfl: 11   atorvastatin (LIPITOR) 80 MG tablet, Take 1 tablet (80 mg total) by mouth daily., Disp: 90 tablet, Rfl: 3   ezetimibe (ZETIA) 10 MG tablet, Take 1 tablet (10 mg total) by mouth daily., Disp: 90 tablet, Rfl: 3  Patient Active Problem List   Diagnosis Date Noted   Vulvar cancer ([place]) 12/18/2022   Mixed hyperlipidemia 09/16/2022   Pelvic floor weakness in female 03/03/2020   Polyp of ascending colon    Abdominal pain, chronic, epigastric    Carotid atherosclerosis, bilateral 11/06/2017   Hx of completed stroke 10/28/2017   Skin pigmentation disorder 07/24/2016   Allergic rhinitis 06/30/2016   Eczema 09/13/2015   Irritable bowel syndrome with constipation 09/13/2015   Seizures ([place]) 08/16/2015   Bickerstaff's migraine 07/06/2012    Past Surgical History:  Procedure Laterality Date   ABDOMINAL HYSTERECTOMY  40s   endometriosis, fibroid tumors; no cancer, partial   BREAST EXCISIONAL BIOPSY Left 1980'S   EXCISIONAL - NEG   BREAST SURGERY     CHOLECYSTECTOMY  31   COLONOSCOPY WITH PROPOFOL N/A 06/23/2019   Procedure: COLONOSCOPY WITH PROPOFOL;  Surgeon: Virgel Manifold, MD;  Location: [facility] ENDOSCOPY;  Service: Endoscopy;  Laterality: N/A;   COLONOSCOPY WITH PROPOFOL N/A 12/28/2020   Procedure: COLONOSCOPY WITH PROPOFOL;  Surgeon: Virgel Manifold, MD;  Location: [facility] ENDOSCOPY;  Service: Endoscopy;  Laterality: N/A;   ESOPHAGOGASTRODUODENOSCOPY (EGD) WITH PROPOFOL N/A 06/23/2019   Procedure: ESOPHAGOGASTRODUODENOSCOPY (EGD) WITH PROPOFOL;  Surgeon: Virgel Manifold, MD;  Location: [facility] ENDOSCOPY;  Service: Endoscopy;   Laterality: N/A;   ESOPHAGOGASTRODUODENOSCOPY (EGD) WITH PROPOFOL N/A 12/28/2020   Procedure: ESOPHAGOGASTRODUODENOSCOPY (EGD) WITH PROPOFOL;  Surgeon: Virgel Manifold, MD;  Location: [facility] ENDOSCOPY;  Service: Endoscopy;  Laterality: N/A;   ESOPHAGOGASTRODUODENOSCOPY (EGD) WITH PROPOFOL N/A 12/27/2022   Procedure: ESOPHAGOGASTRODUODENOSCOPY (EGD) WITH PROPOFOL;  Surgeon: Jonathon Bellows, MD;  Location: [facility] ENDOSCOPY;  Service: Gastroenterology;  Laterality: N/A;   FOOT SURGERY Right    tendon release   VULVECTOMY PARTIAL N/A 01/01/2023   Procedure: EXAM UNDER ANESTHESIA,VULVECTOMY PARTIAL, COLPOSCOPY;  Surgeon: Gillis Ends, MD;  Location: [facility] ORS;  Service: Gynecology;  Laterality: N/A;    Family History  Problem Relation Age of Onset   Coronary artery disease Maternal Uncle    Diabetes Mellitus II Mother    Arthritis Mother    Diabetes Maternal Uncle    Kidney cancer Father 47       died 05-08-17   Kidney disease Father    Cancer Father        kidney and bladder   Arthritis Maternal Uncle    Cancer Maternal Grandmother        stomach?   Jaundice Maternal Grandmother    Stomach cancer Paternal Grandmother    Cancer Paternal Grandmother        stomach   Hypertension Sister    Stroke Maternal Grandfather    Diabetes Paternal Grandfather    Diabetes Sister    Hypertension Sister    Breast cancer Neg Hx    Bladder Cancer Neg Hx     Social History   Tobacco Use   Smoking status: Never    Passive exposure: Never   Smokeless tobacco: Never  Vaping Use   Vaping Use: Never used  Substance Use Topics   Alcohol use: Yes    Comment: occasionally   Drug use: Never     Allergies  Allergen Reactions   Clindamycin/Lincomycin Hives   Hydrocodone Nausea Only   Metronidazole Hives   Sulfa Antibiotics Hives    Break out in hives all over   Oxycodone Nausea And Vomiting    Health Maintenance  Topic Date Due   Zoster Vaccines- Shingrix (1 of 2) Never done    COVID-19 Vaccine (3 - Pfizer risk series) 05/17/2020   INFLUENZA VACCINE  07/30/2022   Medicare Annual Wellness (AWV)  05/10/2023   MAMMOGRAM  09/30/2024   COLONOSCOPY (Pts 45-109yrs Insurance coverage will need to be confirmed)  12/28/2025   DTaP/Tdap/Td (2 - Td or Tdap) 10/28/2027   Hepatitis C Screening  Completed   HIV Screening  Completed   HPV VACCINES  Aged Out   PAP SMEAR-Modifier  Discontinued    Chart Review Today: I personally reviewed active problem list, medication list, allergies, family history, social history, health maintenance, notes from last encounter, lab results, imaging with the patient/caregiver today. Review of neuro in care everywhere, GI procedures, pathology, OV, GYN/gynonc, dietician appt - more than 10 min chart review today prior to appt  Review of Systems  Constitutional: Negative.   HENT: Negative.    Eyes: Negative.   Respiratory: Negative.    Cardiovascular: Negative.   Gastrointestinal: Negative.   Endocrine: Negative.   Genitourinary: Negative.   Musculoskeletal: Negative.   Skin: Negative.   Allergic/Immunologic: Negative.   Neurological: Negative.   Hematological: Negative.   Psychiatric/Behavioral: Negative.    All other systems reviewed and are negative.    Objective:   There were no vitals filed for this visit.  There is no height or weight on file to calculate BMI.  Physical Exam Vitals and nursing note reviewed.  Constitutional:      General: She is not in acute distress.    Appearance: Normal appearance. She is well-developed and well-groomed. She is not ill-appearing, toxic-appearing or diaphoretic.  HENT:     Head: Normocephalic and atraumatic.     Right Ear: External ear normal.     Left Ear: External ear normal.     Nose: Nose normal.  Eyes:     General: No scleral icterus.       Right eye: No discharge.        Left eye: No discharge.     Conjunctiva/sclera: Conjunctivae normal.  Neck:     Trachea: No tracheal  deviation.  Cardiovascular:     Rate and Rhythm: Normal rate and regular rhythm.     Pulses: Normal pulses.     Heart sounds: Normal heart sounds. No murmur heard.    No friction rub. No gallop.  Pulmonary:     Effort: Pulmonary effort is normal. No respiratory distress.     Breath sounds: Normal breath sounds. No stridor.  Skin:    General: Skin is warm and dry.     Findings: No rash.  Neurological:     Mental Status: She is alert.     Motor: No abnormal muscle tone.     Coordination: Coordination normal.  Psychiatric:        Behavior: Behavior normal. Behavior is cooperative.         Assessment & Plan:   Problem List Items Addressed This Visit       Cardiovascular and Mediastinum   Bickerstaff's migraine    Managed by [facility], reviewed in care everywhere on topiramate 200 mg BID and ubrelvy PRN Reports stable well controlled sx      Relevant Medications   NURTEC 75 MG TBDP   ezetimibe (ZETIA) 10 MG tablet   atorvastatin (LIPITOR) 80 MG tablet   Carotid atherosclerosis, bilateral    [place] Nov 2018 - mild, less than 50% On statin and ASA      Relevant Medications   ezetimibe (ZETIA) 10 MG tablet   atorvastatin (LIPITOR) 80 MG tablet   Other Relevant Orders   COMPLETE METABOLIC PANEL WITH GFR   Lipid panel     Digestive   Irritable bowel syndrome with constipation    Recommended per GI to use cholestyramine prn for diarrhea      Relevant Medications   omeprazole (PRILOSEC) 40 MG capsule   Other Relevant Orders   COMPLETE METABOLIC PANEL WITH GFR   CBC with Differential/Platelet     Genitourinary   Vulvar cancer ([place])    Reviewed her OV, procedures, pathology and f/up appt vulvar SCC cancer dx, - s/p WLE with Dr. 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  Clean margins noted on last f/up with specialist 02/05/2023  Other   Seizures ([place])    Well controlled with current  meds - per neurology       Relevant Medications   NURTEC 75 MG TBDP   Hx of completed stroke    Secondary prevention with statin and asa      Abdominal pain, chronic, epigastric    Reviewed all GI records, procedures, pathology and OV notes - pt continues to have sx flare up here and there - though not specifically noted plan per GI after work up and pathology was done -she did have healing gastritis after stopping NSAIDs and taking PPIs - rx for prilosec sent in - explained to pt how it will be preventative and she can take for several days or 1-2 weeks when she has worse epigastric pain, reflux, indigestion (explained it takes a few days to start working)  She should seek GI f/up if sx continue to not be well controlled       Relevant Medications   omeprazole (PRILOSEC) 40 MG capsule   Other Relevant Orders   COMPLETE METABOLIC PANEL WITH GFR   CBC with Differential/Platelet   Mixed hyperlipidemia - Primary    Taking lipitor 80 mg in the am, she did start zetia daily at bedtime, no SE or concerns She did nutrition/dietician consult as well Recheck lipids today She does want to work more on diet/lifestyle Pt encouraged to change lipitor to PM dosing Meds refilled      Relevant Medications   ezetimibe (ZETIA) 10 MG tablet   atorvastatin (LIPITOR) 80 MG tablet   Other Relevant Orders   COMPLETE METABOLIC PANEL WITH GFR   Lipid panel   Other Visit Diagnoses     Medication monitoring encounter       Relevant Orders   COMPLETE METABOLIC PANEL WITH GFR   Lipid panel   CBC with Differential/Platelet   Encounter for monitoring antiplatelet therapy       Relevant Orders   COMPLETE METABOLIC PANEL WITH GFR   CBC with Differential/Platelet   Gastroesophageal reflux disease, unspecified whether esophagitis present       Relevant Medications   omeprazole (PRILOSEC) 40 MG capsule        Return for needs MWV in May, routine f/up 6 months.   Delsa Grana, PA-C 03/17/23 10:55  AM

## 2023-03-17 NOTE — Assessment & Plan Note (Signed)
Reviewed all GI records, procedures, pathology and OV notes - pt continues to have sx flare up here and there - though not specifically noted plan per GI after work up and pathology was done -she did have healing gastritis after stopping NSAIDs and taking PPIs - rx for prilosec sent in - explained to pt how it will be preventative and she can take for several days or 1-2 weeks when she has worse epigastric pain, reflux, indigestion (explained it takes a few days to start working)  She should seek GI f/up if sx continue to not be well controlled

## 2023-03-17 NOTE — Assessment & Plan Note (Signed)
Recommended per GI to use cholestyramine prn for diarrhea

## 2023-03-17 NOTE — Assessment & Plan Note (Signed)
Secondary prevention with statin and asa

## 2023-03-17 NOTE — Assessment & Plan Note (Signed)
[place] Nov 2018 - mild, less than 50% On statin and ASA

## 2023-03-17 NOTE — Assessment & Plan Note (Signed)
Well controlled with current meds - per neurology

## 2023-03-17 NOTE — Assessment & Plan Note (Signed)
Managed by [place], reviewed in care everywhere on topiramate 200 mg BID and ubrelvy PRN Reports stable well controlled sx

## 2023-03-17 NOTE — Assessment & Plan Note (Signed)
Taking lipitor 80 mg in the am, she did start zetia daily at bedtime, no SE or concerns She did nutrition/dietician consult as well Recheck lipids today She does want to work more on diet/lifestyle Pt encouraged to change lipitor to PM dosing Meds refilled

## 2023-03-17 NOTE — Assessment & Plan Note (Signed)
Reviewed her OV, procedures, pathology and f/up appt vulvar SCC cancer dx, - s/p WLE with Dr. 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  Clean margins noted on last f/up with specialist 02/05/2023

## 2023-03-18 LAB — COMPLETE METABOLIC PANEL WITH GFR
AG Ratio: 1.7 (calc) (ref 1.0–2.5)
ALT: 62 U/L — ABNORMAL HIGH (ref 6–29)
AST: 47 U/L — ABNORMAL HIGH (ref 10–35)
Albumin: 4.3 g/dL (ref 3.6–5.1)
Alkaline phosphatase (APISO): 88 U/L (ref 37–153)
BUN: 11 mg/dL (ref 7–25)
CO2: 23 mmol/L (ref 20–32)
Calcium: 9.7 mg/dL (ref 8.6–10.4)
Chloride: 111 mmol/L — ABNORMAL HIGH (ref 98–110)
Creat: 0.91 mg/dL (ref 0.50–1.05)
Globulin: 2.5 g/dL (calc) (ref 1.9–3.7)
Glucose, Bld: 90 mg/dL (ref 65–99)
Potassium: 4.1 mmol/L (ref 3.5–5.3)
Sodium: 143 mmol/L (ref 135–146)
Total Bilirubin: 1.1 mg/dL (ref 0.2–1.2)
Total Protein: 6.8 g/dL (ref 6.1–8.1)
eGFR: 72 mL/min/{1.73_m2} (ref >=60)

## 2023-03-18 LAB — CBC WITH DIFFERENTIAL/PLATELET
Absolute Monocytes: 280 cells/uL (ref 200–950)
Basophils Absolute: 20 cells/uL (ref 0–200)
Basophils Relative: 0.4 %
Eosinophils Absolute: 20 cells/uL (ref 15–500)
Eosinophils Relative: 0.4 %
HCT: 41.4 % (ref 35.0–45.0)
Hemoglobin: 14.1 g/dL (ref 11.7–15.5)
Lymphs Abs: 2895 cells/uL (ref 850–3900)
MCH: 31.3 pg (ref 27.0–33.0)
MCHC: 34.1 g/dL (ref 32.0–36.0)
MCV: 92 fL (ref 80.0–100.0)
MPV: 9.1 fL (ref 7.5–12.5)
Monocytes Relative: 5.6 %
Neutro Abs: 1785 cells/uL (ref 1500–7800)
Neutrophils Relative %: 35.7 %
Platelets: 277 10*3/uL (ref 140–400)
RBC: 4.5 10*6/uL (ref 3.80–5.10)
RDW: 12.7 % (ref 11.0–15.0)
Total Lymphocyte: 57.9 %
WBC: 5 10*3/uL (ref 3.8–10.8)

## 2023-03-18 LAB — LIPID PANEL
Cholesterol: 181 mg/dL (ref <200)
HDL: 74 mg/dL (ref >=50)
LDL Cholesterol (Calc): 93 mg/dL (calc)
Non-HDL Cholesterol (Calc): 107 mg/dL (calc) (ref <130)
Total CHOL/HDL Ratio: 2.4 (calc) (ref <5.0)
Triglycerides: 58 mg/dL (ref <150)

## 2023-03-19 ENCOUNTER — Ambulatory Visit: Payer: Medicare HMO | Admitting: Dietician

## 2023-04-15 DIAGNOSIS — I6381 Other cerebral infarction due to occlusion or stenosis of small artery: Secondary | ICD-10-CM | POA: Diagnosis not present

## 2023-04-15 DIAGNOSIS — I6523 Occlusion and stenosis of bilateral carotid arteries: Secondary | ICD-10-CM | POA: Diagnosis not present

## 2023-04-15 DIAGNOSIS — G43009 Migraine without aura, not intractable, without status migrainosus: Secondary | ICD-10-CM | POA: Diagnosis not present

## 2023-04-15 DIAGNOSIS — I6529 Occlusion and stenosis of unspecified carotid artery: Secondary | ICD-10-CM | POA: Diagnosis not present

## 2023-04-15 DIAGNOSIS — G250 Essential tremor: Secondary | ICD-10-CM | POA: Diagnosis not present

## 2023-04-15 DIAGNOSIS — G43109 Migraine with aura, not intractable, without status migrainosus: Secondary | ICD-10-CM | POA: Diagnosis not present

## 2023-05-28 ENCOUNTER — Ambulatory Visit (INDEPENDENT_AMBULATORY_CARE_PROVIDER_SITE_OTHER): Payer: Medicare HMO | Admitting: Dermatology

## 2023-05-28 VITALS — BP 110/67 | HR 72

## 2023-05-28 DIAGNOSIS — L720 Epidermal cyst: Secondary | ICD-10-CM | POA: Diagnosis not present

## 2023-05-28 DIAGNOSIS — L811 Chloasma: Secondary | ICD-10-CM

## 2023-05-28 DIAGNOSIS — L821 Other seborrheic keratosis: Secondary | ICD-10-CM | POA: Diagnosis not present

## 2023-05-28 MED ORDER — HYDROQUINONE 4 % EX CREA: TOPICAL_CREAM | CUTANEOUS | 3 refills | Status: DC

## 2023-05-28 NOTE — Progress Notes (Signed)
   Follow-Up Visit   Subjective  Elizabeth Mata is a 62 y.o. female who presents for the following: Spots on the lower lip. Patient has new spots forming and they are getting bigger. Irritated and burns with toothpaste.  They are sore.  She also has dark spots on face.    The following portions of the chart were reviewed this encounter and updated as appropriate: medications, allergies, medical history  Review of Systems:  No other skin or systemic complaints except as noted in HPI or Assessment and Plan.  Objective  Well appearing patient in no apparent distress; mood and affect are within normal limits.  A focused examination was performed of the following areas: Face  Relevant physical exam findings are noted in the Assessment and Plan.    Assessment & Plan   Milia, Irritated  - tiny firm white papules - type of cyst - benign - Plan extraction to multiple areas on follow-up. Schedule in surgery slot - 30 min appt.   SEBORRHEIC KERATOSIS - Stuck-on, waxy, tan-brown papules, periocular - Benign-appearing - Discussed benign etiology and prognosis. - Observe - Call for any changes  MELASMA Exam: reticulated hyperpigmented patches at cheeks  Chronic and persistent condition with duration or expected duration over one year. Condition is bothersome/symptomatic for patient. Currently flared.  Melasma is a chronic; persistent condition of hyperpigmented patches generally on the face, worse in summer due to higher UV exposure.    Heredity; thyroid disease; sun exposure; pregnancy; birth control pills; epilepsy medication and darker skin may predispose to Melasma.   Recommendations include: - Sun avoidance and daily broad spectrum (UVA/UVB) tinted mineral sunscreen SPF 30+, with Zinc or Titanium Dioxide. - Rx topical bleaching creams (i.e. hydroquinone) is a common treatment but should not be used long term.  Hydroquinones may be mixed with retinoids; vitamin C; steroids;  Kojic Acid. - Alastin A-luminate, retinoids, vitamin C, topical tranexamic acid, glycolic acid and kojic acid can be used for brightening while on break from hydroquinone - Rx Azelaic Acid is also a treatment option that is safe for pregnancy (Category B). - OTC Heliocare can be helpful in control and prevention. - Oral Rx with Tranexamic Acid 250 mg - 650 mg po daily can be used for moderate to severe cases especially during summer (contraindications include pregnancy; lactation; hx of PE; hx of DVT; clotting disorder; heart disease; anticoagulant use and upcoming long trips)   - Chemical peels (would need multiple for best result).  - Lasers and  Microdermabrasion may also be helpful adjunct treatments.  Treatment Plan: Recommend daily broad spectrum sunscreen SPF 30+ to sun-exposed areas, reapply every 2 hours as needed. Call for new or changing lesions.  Staying in the shade or wearing long sleeves, sun glasses (UVA+UVB protection) and wide brim hats (4-inch brim around the entire circumference of the hat) are also recommended for sun protection.   Start hydroquinone 4% cream Apply to dark spots on face BID 3Rf. Use no more than 3 months at a time.   Return for as scheduled for mila extraction, 30 min appt, take photos face.  ICherlyn Labella, CMA, am acting as scribe for Willeen Niece, MD .   Documentation: I have reviewed the above documentation for accuracy and completeness, and I agree with the above.  Willeen Niece, MD

## 2023-05-28 NOTE — Patient Instructions (Signed)
Due to recent changes in healthcare laws, you may see results of your pathology and/or laboratory studies on MyChart before the doctors have had a chance to review them. We understand that in some cases there may be results that are confusing or concerning to you. Please understand that not all results are received at the same time and often the doctors may need to interpret multiple results in order to provide you with the best plan of care or course of treatment. Therefore, we ask that you please give us 2 business days to thoroughly review all your results before contacting the office for clarification. Should we see a critical lab result, you will be contacted sooner.   If You Need Anything After Your Visit  If you have any questions or concerns for your doctor, please call our main line at [phone] and press option 4 to reach your doctor's medical assistant. If no one answers, please leave a voicemail as directed and we will return your call as soon as possible. Messages left after 4 pm will be answered the following business day.   You may also send us a message via MyChart. We typically respond to MyChart messages within 1-2 business days.  For prescription refills, please ask your pharmacy to contact our office. Our fax number is [phone].  If you have an urgent issue when the clinic is closed that cannot wait until the next business day, you can page your doctor at the number below.    Please note that while we do our best to be available for urgent issues outside of office hours, we are not available 24/7.   If you have an urgent issue and are unable to reach us, you may choose to seek medical care at your doctor's office, retail clinic, urgent care center, or emergency room.  If you have a medical emergency, please immediately call 911 or go to the emergency department.  Pager Numbers  - Dr. Kowalski: [phone]  - Dr. Moye: [phone]  - Dr. Stewart:  [phone]  In the event of inclement weather, please call our main line at [phone] for an update on the status of any delays or closures.  Dermatology Medication Tips: Please keep the boxes that topical medications come in in order to help keep track of the instructions about where and how to use these. Pharmacies typically print the medication instructions only on the boxes and not directly on the medication tubes.   If your medication is too expensive, please contact our office at [phone] option 4 or send us a message through MyChart.   We are unable to tell what your co-pay for medications will be in advance as this is different depending on your insurance coverage. However, we may be able to find a substitute medication at lower cost or fill out paperwork to get insurance to cover a needed medication.   If a prior authorization is required to get your medication covered by your insurance company, please allow us 1-2 business days to complete this process.  Drug prices often vary depending on where the prescription is filled and some pharmacies may offer cheaper prices.  The website www.goodrx.com contains coupons for medications through different pharmacies. The prices here do not account for what the cost may be with help from insurance (it may be cheaper with your insurance), but the website can give you the price if you did not use any insurance.  - You can print the associated coupon and take it with   your prescription to the pharmacy.  - You may also stop by our office during regular business hours and pick up a GoodRx coupon card.  - If you need your prescription sent electronically to a different pharmacy, notify our office through [facility] MyChart or by phone at [phone] option 4.     Si Usted Necesita Algo Despus de Su Visita  Tambin puede enviarnos un mensaje a travs de MyChart. Por lo general respondemos a los mensajes de MyChart en el transcurso de 1 a 2  das hbiles.  Para renovar recetas, por favor pida a su farmacia que se ponga en contacto con nuestra oficina. Nuestro nmero de fax es el [phone].  Si tiene un asunto urgente cuando la clnica est cerrada y que no puede esperar hasta el siguiente da hbil, puede llamar/localizar a su doctor(a) al nmero que aparece a continuacin.   Por favor, tenga en cuenta que aunque hacemos todo lo posible para estar disponibles para asuntos urgentes fuera del horario de oficina, no estamos disponibles las 24 horas del da, los 7 das de la semana.   Si tiene un problema urgente y no puede comunicarse con nosotros, puede optar por buscar atencin mdica  en el consultorio de su doctor(a), en una clnica privada, en un centro de atencin urgente o en una sala de emergencias.  Si tiene una emergencia mdica, por favor llame inmediatamente al 911 o vaya a la sala de emergencias.  Nmeros de bper  - Dr. Kowalski: [phone]  - Dra. Moye: [phone]  - Dra. Stewart: [phone]  En caso de inclemencias del tiempo, por favor llame a nuestra lnea principal al [phone] para una actualizacin sobre el estado de cualquier retraso o cierre.  Consejos para la medicacin en dermatologa: Por favor, guarde las cajas en las que vienen los medicamentos de uso tpico para ayudarle a seguir las instrucciones sobre dnde y cmo usarlos. Las farmacias generalmente imprimen las instrucciones del medicamento slo en las cajas y no directamente en los tubos del medicamento.   Si su medicamento es muy caro, por favor, pngase en contacto con nuestra oficina llamando al [phone] y presione la opcin 4 o envenos un mensaje a travs de MyChart.   No podemos decirle cul ser su copago por los medicamentos por adelantado ya que esto es diferente dependiendo de la cobertura de su seguro. Sin embargo, es posible que podamos encontrar un medicamento sustituto a menor costo o llenar un formulario para que el  seguro cubra el medicamento que se considera necesario.   Si se requiere una autorizacin previa para que su compaa de seguros cubra su medicamento, por favor permtanos de 1 a 2 das hbiles para completar este proceso.  Los precios de los medicamentos varan con frecuencia dependiendo del lugar de dnde se surte la receta y alguna farmacias pueden ofrecer precios ms baratos.  El sitio web www.goodrx.com tiene cupones para medicamentos de diferentes farmacias. Los precios aqu no tienen en cuenta lo que podra costar con la ayuda del seguro (puede ser ms barato con su seguro), pero el sitio web puede darle el precio si no utiliz ningn seguro.  - Puede imprimir el cupn correspondiente y llevarlo con su receta a la farmacia.  - Tambin puede pasar por nuestra oficina durante el horario de atencin regular y recoger una tarjeta de cupones de GoodRx.  - Si necesita que su receta se enve electrnicamente a una farmacia diferente, informe a nuestra oficina a travs de MyChart de [facility]   o por telfono llamando al [phone] y presione la opcin 4.  

## 2023-06-06 ENCOUNTER — Ambulatory Visit (INDEPENDENT_AMBULATORY_CARE_PROVIDER_SITE_OTHER): Payer: Medicare HMO

## 2023-06-06 VITALS — Ht 59.0 in | Wt 138.0 lb

## 2023-06-06 DIAGNOSIS — Z0000 Encounter for general adult medical examination without abnormal findings: Secondary | ICD-10-CM | POA: Diagnosis not present

## 2023-06-06 NOTE — Patient Instructions (Signed)
[name] , Thank you for taking time to come for your Medicare Wellness Visit. I appreciate your ongoing commitment to your health goals. Please review the following plan we discussed and let me know if I can assist you in the future.   These are the goals we discussed:  Goals      Patient Stated     Patient states she would like to be more productive over the next year.      Patient Stated     Patient states she would like to reduce caffeine intake to no more than 2-3 cups of coffee per day        This is a list of the screening recommended for you and due dates:  Health Maintenance  Topic Date Due   COVID-19 Vaccine (3 - Pfizer risk series) 05/17/2020   Zoster (Shingles) Vaccine (1 of 2) 06/17/2023*   Flu Shot  07/31/2023   Medicare Annual Wellness Visit  06/05/2024   Mammogram  09/30/2024   Colon Cancer Screening  12/28/2025   DTaP/Tdap/Td vaccine (2 - Td or Tdap) 10/28/2027   Hepatitis C Screening  Completed   HIV Screening  Completed   HPV Vaccine  Aged Out   Pap Smear  Discontinued  *Topic was postponed. The date shown is not the original due date.    Advanced directives: no  Conditions/risks identified: low falls risk  Next appointment: Follow up in one year for your annual wellness visit. 06/11/2024 @10 :45am telephone  Preventive Care 40-64 Years, Female Preventive care refers to lifestyle choices and visits with your health care provider that can promote health and wellness. What does preventive care include? A yearly physical exam. This is also called an annual well check. Dental exams once or twice a year. Routine eye exams. Ask your health care provider how often you should have your eyes checked. Personal lifestyle choices, including: Daily care of your teeth and gums. Regular physical activity. Eating a healthy diet. Avoiding tobacco and drug use. Limiting alcohol use. Practicing safe sex. Taking low-dose aspirin daily starting at age 65. Taking  vitamin and mineral supplements as recommended by your health care provider. What happens during an annual well check? The services and screenings done by your health care provider during your annual well check will depend on your age, overall health, lifestyle risk factors, and family history of disease. Counseling  Your health care provider may ask you questions about your: Alcohol use. Tobacco use. Drug use. Emotional well-being. Home and relationship well-being. Sexual activity. Eating habits. Work and work Astron[employer]. Method of birth control. Menstrual cycle. Pregnancy history. Screening  You may have the following tests or measurements: Height, weight, and BMI. Blood pressure. Lipid and cholesterol levels. These may be checked every 5 years, or more frequently if you are over 59 years old. Skin check. Lung cancer screening. You may have this screening every year starting at age 90 if you have a 30-pack-year history of smoking and currently smoke or have quit within the past 15 years. Fecal occult blood test (FOBT) of the stool. You may have this test every year starting at age 31. Flexible sigmoidoscopy or colonoscopy. You may have a sigmoidoscopy every 5 years or a colonoscopy every 10 years starting at age 12. Hepatitis C blood test. Hepatitis B blood test. Sexually transmitted disease (STD) testing. Diabetes screening. This is done by checking your blood sugar (glucose) after you have not eaten for a while (fasting). You may have this done every  1-3 years. Mammogram. This may be done every 1-2 years. Talk to your health care provider about when you should start having regular mammograms. This may depend on whether you have a family history of breast cancer. BRCA-related cancer screening. This may be done if you have a family history of breast, ovarian, tubal, or peritoneal cancers. Pelvic exam and Pap test. This may be done every 3 years starting at age 70. Starting at age  51, this may be done every 5 years if you have a Pap test in combination with an HPV test. Bone density scan. This is done to screen for osteoporosis. You may have this scan if you are at high risk for osteoporosis. Discuss your test results, treatment options, and if necessary, the need for more tests with your health care provider. Vaccines  Your health care provider may recommend certain vaccines, such as: Influenza vaccine. This is recommended every year. Tetanus, diphtheria, and acellular pertussis (Tdap, Td) vaccine. You may need a Td booster every 10 years. Zoster vaccine. You may need this after age 34. Pneumococcal 13-valent conjugate (PCV13) vaccine. You may need this if you have certain conditions and were not previously vaccinated. Pneumococcal polysaccharide (PPSV23) vaccine. You may need one or two doses if you smoke cigarettes or if you have certain conditions. Talk to your health care provider about which screenings and vaccines you need and how often you need them. This information is not intended to replace advice given to you by your health care provider. Make sure you discuss any questions you have with your health care provider. Document Released: 01/12/2016 Document Revised: 09/04/2016 Document Reviewed: 10/17/2015 Elsevier Interactive Patient Education  2017 [employer].    Fall Prevention in the Home Falls can cause injuries. They can happen to people of all ages. There are many things you can do to make your home safe and to help prevent falls. What can I do on the outside of my home? Regularly fix the edges of walkways and driveways and fix any cracks. Remove anything that might make you trip as you walk through a door, such as a raised step or threshold. Trim any bushes or trees on the path to your home. Use bright outdoor lighting. Clear any walking paths of anything that might make someone trip, such as rocks or tools. Regularly check to see if handrails are  loose or broken. Make sure that both sides of any steps have handrails. Any raised decks and porches should have guardrails on the edges. Have any leaves, snow, or ice cleared regularly. Use sand or salt on walking paths during winter. Clean up any spills in your garage right away. This includes oil or grease spills. What can I do in the bathroom? Use night lights. Install grab bars by the toilet and in the tub and shower. Do not use towel bars as grab bars. Use non-skid mats or decals in the tub or shower. If you need to sit down in the shower, use a plastic, non-slip stool. Keep the floor dry. Clean up any water that spills on the floor as soon as it happens. Remove soap buildup in the tub or shower regularly. Attach bath mats securely with double-sided non-slip rug tape. Do not have throw rugs and other things on the floor that can make you trip. What can I do in the bedroom? Use night lights. Make sure that you have a light by your bed that is easy to reach. Do not use any sheets or  blankets that are too big for your bed. They should not hang down onto the floor. Have a firm chair that has side arms. You can use this for support while you get dressed. Do not have throw rugs and other things on the floor that can make you trip. What can I do in the kitchen? Clean up any spills right away. Avoid walking on wet floors. Keep items that you use a lot in easy-to-reach places. If you need to reach something above you, use a strong step stool that has a grab bar. Keep electrical cords out of the way. Do not use floor polish or wax that makes floors slippery. If you must use wax, use non-skid floor wax. Do not have throw rugs and other things on the floor that can make you trip. What can I do with my stairs? Do not leave any items on the stairs. Make sure that there are handrails on both sides of the stairs and use them. Fix handrails that are broken or loose. Make sure that handrails are as  long as the stairways. Check any carpeting to make sure that it is firmly attached to the stairs. Fix any carpet that is loose or worn. Avoid having throw rugs at the top or bottom of the stairs. If you do have throw rugs, attach them to the floor with carpet tape. Make sure that you have a light switch at the top of the stairs and the bottom of the stairs. If you do not have them, ask someone to add them for you. What else can I do to help prevent falls? Wear shoes that: Do not have high heels. Have rubber bottoms. Are comfortable and fit you well. Are closed at the toe. Do not wear sandals. If you use a stepladder: Make sure that it is fully opened. Do not climb a closed stepladder. Make sure that both sides of the stepladder are locked into place. Ask someone to hold it for you, if possible. Clearly mark and make sure that you can see: Any grab bars or handrails. First and last steps. Where the edge of each step is. Use tools that help you move around (mobility aids) if they are needed. These include: Canes. Walkers. Scooters. Crutches. Turn on the lights when you go into a dark area. Replace any light bulbs as soon as they burn out. Set up your furniture so you have a clear path. Avoid moving your furniture around. If any of your floors are uneven, fix them. If there are any pets around you, be aware of where they are. Review your medicines with your doctor. Some medicines can make you feel dizzy. This can increase your chance of falling. Ask your doctor what other things that you can do to help prevent falls. This information is not intended to replace advice given to you by your health care provider. Make sure you discuss any questions you have with your health care provider. Document Released: 10/12/2009 Document Revised: 05/23/2016 Document Reviewed: 01/20/2015 Elsevier Interactive Patient Education  2017 [employer]  

## 2023-06-06 NOTE — Progress Notes (Signed)
I connected with  TESS POTTS on 06/06/23 by a audio enabled telemedicine application and verified that I am speaking with the correct person using two identifiers.  Patient Location: Home  Provider Location: Office/Clinic  I discussed the limitations of evaluation and management by telemedicine. The patient expressed understanding and agreed to proceed.  Subjective:   Elizabeth Mata is a 62 y.o. female who presents for Medicare Annual (Subsequent) preventive examination.  Review of Systems    Cardiac Risk Factors include: advanced age (>62men, >38 women)    Objective:    Today's Vitals   06/06/23 1047  Weight: 138 lb (62.6 kg)  Height: 4\' 11"  (1.499 m)   Body mass index is 27.87 kg/m.     06/06/2023   10:56 AM 02/05/2023    9:37 AM 01/01/2023   12:27 PM 12/27/2022    7:50 AM 12/24/2022    1:15 PM 12/18/2022    8:56 AM 11/28/2022    5:07 PM  Advanced Directives  Does Patient Have a Medical Advance Directive? No No No No No No No  Would patient like information on creating a medical advance directive?  No - Patient declined    No - Patient declined No - Patient declined    Current Medications (verified) Outpatient Encounter Medications as of 06/06/2023  Medication Sig   aspirin EC 81 MG EC tablet Take 1 tablet (81 mg total) by mouth daily.   atorvastatin (LIPITOR) 80 MG tablet Take 1 tablet (80 mg total) by mouth daily.   ezetimibe (ZETIA) 10 MG tablet Take 1 tablet (10 mg total) by mouth daily.   fexofenadine (ALLEGRA) 180 MG tablet Take 180 mg by mouth daily.   hydroquinone 4 % cream Apply twice daily to dark spots on face. Use no more than 3 months at a time.   NURTEC 75 MG TBDP Take 1 tablet by mouth as directed.   omeprazole (PRILOSEC) 40 MG capsule Take 1 capsule (40 mg total) by mouth daily as needed (epigastric pain, reflux, indigestion).   topiramate (TOPAMAX) 200 MG tablet Take 200 mg by mouth 2 (two) times daily.    No facility-administered encounter  medications on file as of 06/06/2023.    Allergies (verified) Clindamycin/lincomycin, Hydrocodone, Metronidazole, Sulfa antibiotics, and Oxycodone   History: Past Medical History:  Diagnosis Date   Allergic rhinitis 06/30/2016   Allergy    Carotid atherosclerosis, bilateral 11/06/2017   [place] Nov 2018   Cyst of breast, left, solitary    Flank pain    GERD (gastroesophageal reflux disease)    Hemorrhoid    Hemorrhoids    Hyperlipidemia    IFG (impaired fasting glucose) 07/18/2016   June 2017    Kidney stone on left side 10/24/2015   Kidney stones    Neuropathy 07/24/2016   Seizures (HCC)    Shingles outbreak 02/03/2017   Stomach irritation    Syncope    Recurrent over the last 2 years. had workup at [facility] this year with cardiology and neurology. Had echo and 3-week event monitor that were unremarkable. Has had negative EEG. Echo in 11/11 at [facility] showed no significant abnormalities. Possibly vasovagal   TIA (transient ischemic attack) 10/28/2017   Trigger thumb    Past Surgical History:  Procedure Laterality Date   ABDOMINAL HYSTERECTOMY  40s   endometriosis, fibroid tumors; no cancer, partial   BREAST EXCISIONAL BIOPSY Left 1980'S   EXCISIONAL - NEG   BREAST SURGERY     CHOLECYSTECTOMY  31  COLONOSCOPY WITH PROPOFOL N/A 06/23/2019   Procedure: COLONOSCOPY WITH PROPOFOL;  Surgeon: Pasty Spillers, MD;  Location: [facility] ENDOSCOPY;  Service: Endoscopy;  Laterality: N/A;   COLONOSCOPY WITH PROPOFOL N/A 12/28/2020   Procedure: COLONOSCOPY WITH PROPOFOL;  Surgeon: Pasty Spillers, MD;  Location: [facility] ENDOSCOPY;  Service: Endoscopy;  Laterality: N/A;   ESOPHAGOGASTRODUODENOSCOPY (EGD) WITH PROPOFOL N/A 06/23/2019   Procedure: ESOPHAGOGASTRODUODENOSCOPY (EGD) WITH PROPOFOL;  Surgeon: Pasty Spillers, MD;  Location: [facility] ENDOSCOPY;  Service: Endoscopy;  Laterality: N/A;   ESOPHAGOGASTRODUODENOSCOPY (EGD) WITH PROPOFOL N/A 12/28/2020   Procedure: ESOPHAGOGASTRODUODENOSCOPY (EGD)  WITH PROPOFOL;  Surgeon: Pasty Spillers, MD;  Location: [facility] ENDOSCOPY;  Service: Endoscopy;  Laterality: N/A;   ESOPHAGOGASTRODUODENOSCOPY (EGD) WITH PROPOFOL N/A 12/27/2022   Procedure: ESOPHAGOGASTRODUODENOSCOPY (EGD) WITH PROPOFOL;  Surgeon: Wyline Mood, MD;  Location: [facility] ENDOSCOPY;  Service: Gastroenterology;  Laterality: N/A;   FOOT SURGERY Right    tendon release   VULVECTOMY PARTIAL N/A 01/01/2023   Procedure: EXAM UNDER ANESTHESIA,VULVECTOMY PARTIAL, COLPOSCOPY;  Surgeon: Artelia Laroche, MD;  Location: [facility] ORS;  Service: Gynecology;  Laterality: N/A;   Family History  Problem Relation Age of Onset   Coronary artery disease Maternal Uncle    Diabetes Mellitus II Mother    Arthritis Mother    Diabetes Maternal Uncle    Kidney cancer Father 45       died 05-03-2017   Kidney disease Father    Cancer Father        kidney and bladder   Arthritis Maternal Uncle    Cancer Maternal Grandmother        stomach?   Jaundice Maternal Grandmother    Stomach cancer Paternal Grandmother    Cancer Paternal Grandmother        stomach   Hypertension Sister    Stroke Maternal Grandfather    Diabetes Paternal Grandfather    Diabetes Sister    Hypertension Sister    Breast cancer Neg Hx    Bladder Cancer Neg Hx    Social History   Socioeconomic History   Marital status: Legally Separated    Spouse name: Not on file   Number of children: 2   Years of education: Not on file   Highest education level: GED or equivalent  Occupational History   Occupation: unemployed  Tobacco Use   Smoking status: Never    Passive exposure: Never   Smokeless tobacco: Never  Vaping Use   Vaping Use: Never used  Substance and Sexual Activity   Alcohol use: Yes    Comment: occasionally   Drug use: Never   Sexual activity: Yes    Partners: Male    Birth control/protection: Surgical    Comment: Hysterectomy  Other Topics Concern   Not on file  Social History Narrative    Regular exercise   Social Determinants of Health   Financial Resource Strain: Low Risk  (06/06/2023)   Overall Financial Resource Strain (CARDIA)    Difficulty of Paying Living Expenses: Not hard at all  Food Insecurity: No Food Insecurity (06/06/2023)   Hunger Vital Sign    Worried About Running Out of Food in the Last Year: Never true    Ran Out of Food in the Last Year: Never true  Transportation Needs: No Transportation Needs (06/06/2023)   PRAPARE - Administrator, Civil Service (Medical): No    Lack of Transportation (Non-Medical): No  Physical Activity: Sufficiently Active (06/06/2023)   Exercise Vital Sign  Days of Exercise per Week: 7 days    Minutes of Exercise per Session: 30 min  Stress: No Stress Concern Present (06/06/2023)   [employer] of Occupational Health - Occupational Stress Questionnaire    Feeling of Stress : Not at all  Social Connections: Moderately Isolated (06/06/2023)   Social Connection and Isolation Panel [NHANES]    Frequency of Communication with Friends and Family: More than three times a week    Frequency of Social Gatherings with Friends and Family: Three times a week    Attends Religious Services: More than 4 times per year    Active Member of Clubs or Organizations: No    Attends Banker Meetings: Never    Marital Status: Separated    Tobacco Counseling Counseling given: Not Answered  Clinical Intake:  Pre-visit preparation completed: Yes  Pain : No/denies pain   BMI - recorded: 27.87 Nutritional Status: BMI 25 -29 Overweight Nutritional Risks: None Diabetes: No  How often do you need to have someone help you when you read instructions, pamphlets, or other written materials from your doctor or pharmacy?: 1 - Never  Diabetic?no  Interpreter Needed?: No  Comments: live alone Information entered by :: B.Jakeem Grape,LPN   Activities of Daily Living    06/06/2023   10:56 AM 03/17/2023   11:25 AM  In your  present state of health, do you have any difficulty performing the following activities:  Hearing? 0 0  Vision? 1 1  Difficulty concentrating or making decisions? 0 0  Walking or climbing stairs? 0 0  Dressing or bathing? 0 0  Doing errands, shopping? 0 0  Preparing Food and eating ? N   Using the Toilet? N   In the past six months, have you accidently leaked urine? N   Do you have problems with loss of bowel control? N   Managing your Medications? N   Managing your Finances? N   Housekeeping or managing your Housekeeping? N     Patient Care Team: Danelle Berry, PA-C as PCP - General (Family Medicine) Mhoon, Jill Alexanders, MD as Consulting Physician (Neurology) Wyline Mood, MD as Consulting Physician (Gastroenterology) Willeen Niece, MD (Dermatology) Benita Gutter, RN as Oncology Nurse Navigator [place], [place], MD as Referring Physician (Obstetrics)  Indicate any recent Medical Services you may have received from other than [facility] providers in the past year (date may be approximate).     Assessment:   This is a routine wellness examination for [place].  Hearing/Vision screen Hearing Screening - Comments:: Adequate hearing Vision Screening - Comments:: Vision changes:little blurry:cannot see fine print [facility]  Dietary issues and exercise activities discussed: Current Exercise Habits: Home exercise routine, Type of exercise: walking;treadmill, Time (Minutes): 30, Frequency (Times/Week): 7, Weekly Exercise (Minutes/Week): 210, Intensity: Mild, Exercise limited by: None identified   Goals Addressed             This Visit's Progress    Patient Stated   On track    Patient states she would like to be more productive over the next year.      Patient Stated   Not on track    Patient states she would like to reduce caffeine intake to no more than 2-3 cups of coffee per day       Depression Screen    06/06/2023   10:52 AM 03/17/2023   11:26 AM 11/28/2022    5:08  PM 09/16/2022    2:44 PM 05/09/2022    2:04 PM 03/26/2022  9:41 AM 11/13/2021   11:26 AM  PHQ 2/9 Scores  PHQ - 2 Score 0 0 0 0 0 0 0  PHQ- 9 Score  0  0  0 0    Fall Risk    06/06/2023   10:50 AM 03/17/2023   11:25 AM 11/28/2022    5:08 PM 09/16/2022    2:44 PM 05/09/2022    2:07 PM  Fall Risk   Falls in the past year? 0 0 0 0 0  Number falls in past yr: 0 0  0 0  Injury with Fall? 0 0  0 0  Risk for fall due to : No Fall Risks No Fall Risks  No Fall Risks No Fall Risks  Follow up Education provided;Falls prevention discussed Falls prevention discussed;Education provided;Falls evaluation completed  Falls prevention discussed;Education provided Falls prevention discussed    FALL RISK PREVENTION PERTAINING TO THE HOME:  Any stairs in or around the home? Yes  If so, are there any without handrails? Yes  Home free of loose throw rugs in walkways, pet beds, electrical cords, etc? Yes  Adequate lighting in your home to reduce risk of falls? Yes   ASSISTIVE DEVICES UTILIZED TO PREVENT FALLS:  Life alert? No  Use of a cane, walker or w/c? No  Grab bars in the bathroom? Yes  Shower chair or bench in shower? No  Elevated toilet seat or a handicapped toilet? No    Cognitive Function:        06/06/2023   11:02 AM 02/12/2019   11:57 AM  6CIT Screen  What Year? 0 points 0 points  What month? 0 points 0 points  What time? 0 points 0 points  Count back from 20 0 points 0 points  Months in reverse 0 points 0 points  Repeat phrase 2 points 0 points  Total Score 2 points 0 points    Immunizations Immunization History  Administered Date(s) Administered   Influenza,inj,Quad PF,6+ Mos 09/13/2015, 10/08/2016, 09/26/2017, 09/15/2018, 08/26/2019, 10/10/2021   Influenza-Unspecified 09/13/2015, 10/08/2016, 09/26/2017, 09/15/2018   PFIZER(Purple Top)SARS-COV-2 Vaccination 03/25/2020, 04/19/2020   Tdap 10/27/2017    TDAP status: Up to date  Flu Vaccine status: Up to  date  Pneumococcal vaccine status: Due, Education has been provided regarding the importance of this vaccine. Advised may receive this vaccine at local pharmacy or Health Dept. Aware to provide a copy of the vaccination record if obtained from local pharmacy or Health Dept. Verbalized acceptance and understanding.  Covid-19 vaccine status: Completed vaccines  Qualifies for Shingles Vaccine? Yes   Zostavax completed Yes   Shingrix Completed?: Yes  Screening Tests Health Maintenance  Topic Date Due   COVID-19 Vaccine (3 - Pfizer risk series) 05/17/2020   Zoster Vaccines- Shingrix (1 of 2) 06/17/2023 (Originally 07/13/1980)   INFLUENZA VACCINE  07/31/2023   Medicare Annual Wellness (AWV)  06/05/2024   MAMMOGRAM  09/30/2024   Colonoscopy  12/28/2025   DTaP/Tdap/Td (2 - Td or Tdap) 10/28/2027   Hepatitis C Screening  Completed   HIV Screening  Completed   HPV VACCINES  Aged Out   PAP SMEAR-Modifier  Discontinued    Health Maintenance  Health Maintenance Due  Topic Date Due   COVID-19 Vaccine (3 - Pfizer risk series) 05/17/2020    Colorectal cancer screening: Type of screening: Colonoscopy. Completed yes. Repeat every 5 years  Mammogram status: Completed yes. Repeat every year  Lung Cancer Screening: (Low Dose CT Chest recommended if Age 11-80 years, 30 pack-year currently smoking  OR have quit w/in 15years.) does not qualify.   Lung Cancer Screening Referral: no  Additional Screening:  Hepatitis C Screening: does not qualify; Completed yes  Vision Screening: Recommended annual ophthalmology exams for early detection of glaucoma and other disorders of the eye. Is the patient up to date with their annual eye exam?  Yes  Who is the provider or what is the name of the office in which the patient attends annual eye exams? [facility] If pt is not established with a provider, would they like to be referred to a provider to establish care? No .   Dental Screening: Recommended  annual dental exams for proper oral hygiene  Community Resource Referral / Chronic Care Management: CRR required this visit?  No   CCM required this visit?  No     Plan:     I have personally reviewed and noted the following in the patient's chart:   Medical and social history Use of alcohol, tobacco or illicit drugs  Current medications and supplements including opioid prescriptions. Patient is not currently taking opioid prescriptions. Functional ability and status Nutritional status Physical activity Advanced directives List of other physicians Hospitalizations, surgeries, and ER visits in previous 12 months Vitals Screenings to include cognitive, depression, and falls Referrals and appointments  In addition, I have reviewed and discussed with patient certain preventive protocols, quality metrics, and best practice recommendations. A written personalized care plan for preventive services as well as general preventive health recommendations were provided to patient.     Sue Lush, LPN   4/0/9811   Nurse Notes: The patient states she is doing well and has no concerns or questions at this time.

## 2023-06-09 ENCOUNTER — Ambulatory Visit (INDEPENDENT_AMBULATORY_CARE_PROVIDER_SITE_OTHER): Payer: Medicare HMO | Admitting: Dermatology

## 2023-06-09 VITALS — BP 98/70 | HR 81

## 2023-06-09 DIAGNOSIS — L720 Epidermal cyst: Secondary | ICD-10-CM | POA: Diagnosis not present

## 2023-06-09 DIAGNOSIS — L811 Chloasma: Secondary | ICD-10-CM

## 2023-06-09 NOTE — Patient Instructions (Signed)
Due to recent changes in healthcare laws, you may see results of your pathology and/or laboratory studies on MyChart before the doctors have had a chance to review them. We understand that in some cases there may be results that are confusing or concerning to you. Please understand that not all results are received at the same time and often the doctors may need to interpret multiple results in order to provide you with the best plan of care or course of treatment. Therefore, we ask that you please give us 2 business days to thoroughly review all your results before contacting the office for clarification. Should we see a critical lab result, you will be contacted sooner.   If You Need Anything After Your Visit  If you have any questions or concerns for your doctor, please call our main line at [phone] and press option 4 to reach your doctor's medical assistant. If no one answers, please leave a voicemail as directed and we will return your call as soon as possible. Messages left after 4 pm will be answered the following business day.   You may also send us a message via MyChart. We typically respond to MyChart messages within 1-2 business days.  For prescription refills, please ask your pharmacy to contact our office. Our fax number is [phone].  If you have an urgent issue when the clinic is closed that cannot wait until the next business day, you can page your doctor at the number below.    Please note that while we do our best to be available for urgent issues outside of office hours, we are not available 24/7.   If you have an urgent issue and are unable to reach us, you may choose to seek medical care at your doctor's office, retail clinic, urgent care center, or emergency room.  If you have a medical emergency, please immediately call 911 or go to the emergency department.  Pager Numbers  - Dr. Kowalski: [phone]  - Dr. Moye: [phone]  - Dr. Stewart:  [phone]  In the event of inclement weather, please call our main line at [phone] for an update on the status of any delays or closures.  Dermatology Medication Tips: Please keep the boxes that topical medications come in in order to help keep track of the instructions about where and how to use these. Pharmacies typically print the medication instructions only on the boxes and not directly on the medication tubes.   If your medication is too expensive, please contact our office at [phone] option 4 or send us a message through MyChart.   We are unable to tell what your co-pay for medications will be in advance as this is different depending on your insurance coverage. However, we may be able to find a substitute medication at lower cost or fill out paperwork to get insurance to cover a needed medication.   If a prior authorization is required to get your medication covered by your insurance company, please allow us 1-2 business days to complete this process.  Drug prices often vary depending on where the prescription is filled and some pharmacies may offer cheaper prices.  The website www.goodrx.com contains coupons for medications through different pharmacies. The prices here do not account for what the cost may be with help from insurance (it may be cheaper with your insurance), but the website can give you the price if you did not use any insurance.  - You can print the associated coupon and take it with   your prescription to the pharmacy.  - You may also stop by our office during regular business hours and pick up a GoodRx coupon card.  - If you need your prescription sent electronically to a different pharmacy, notify our office through [place] MyChart or by phone at [phone] option 4.     Si Usted Necesita Algo Despus de Su Visita  Tambin puede enviarnos un mensaje a travs de MyChart. Por lo general respondemos a los mensajes de MyChart en el transcurso de 1 a 2  das hbiles.  Para renovar recetas, por favor pida a su farmacia que se ponga en contacto con nuestra oficina. Nuestro nmero de fax es el [phone].  Si tiene un asunto urgente cuando la clnica est cerrada y que no puede esperar hasta el siguiente da hbil, puede llamar/localizar a su doctor(a) al nmero que aparece a continuacin.   Por favor, tenga en cuenta que aunque hacemos todo lo posible para estar disponibles para asuntos urgentes fuera del horario de oficina, no estamos disponibles las 24 horas del da, los 7 das de la semana.   Si tiene un problema urgente y no puede comunicarse con nosotros, puede optar por buscar atencin mdica  en el consultorio de su doctor(a), en una clnica privada, en un centro de atencin urgente o en una sala de emergencias.  Si tiene una emergencia mdica, por favor llame inmediatamente al 911 o vaya a la sala de emergencias.  Nmeros de bper  - Dr. Kowalski: [phone]  - Dra. Moye: [phone]  - Dra. Stewart: [phone]  En caso de inclemencias del tiempo, por favor llame a nuestra lnea principal al [phone] para una actualizacin sobre el estado de cualquier retraso o cierre.  Consejos para la medicacin en dermatologa: Por favor, guarde las cajas en las que vienen los medicamentos de uso tpico para ayudarle a seguir las instrucciones sobre dnde y cmo usarlos. Las farmacias generalmente imprimen las instrucciones del medicamento slo en las cajas y no directamente en los tubos del medicamento.   Si su medicamento es muy caro, por favor, pngase en contacto con nuestra oficina llamando al [phone] y presione la opcin 4 o envenos un mensaje a travs de MyChart.   No podemos decirle cul ser su copago por los medicamentos por adelantado ya que esto es diferente dependiendo de la cobertura de su seguro. Sin embargo, es posible que podamos encontrar un medicamento sustituto a menor costo o llenar un formulario para que el  seguro cubra el medicamento que se considera necesario.   Si se requiere una autorizacin previa para que su compaa de seguros cubra su medicamento, por favor permtanos de 1 a 2 das hbiles para completar este proceso.  Los precios de los medicamentos varan con frecuencia dependiendo del lugar de dnde se surte la receta y alguna farmacias pueden ofrecer precios ms baratos.  El sitio web www.goodrx.com tiene cupones para medicamentos de diferentes farmacias. Los precios aqu no tienen en cuenta lo que podra costar con la ayuda del seguro (puede ser ms barato con su seguro), pero el sitio web puede darle el precio si no utiliz ningn seguro.  - Puede imprimir el cupn correspondiente y llevarlo con su receta a la farmacia.  - Tambin puede pasar por nuestra oficina durante el horario de atencin regular y recoger una tarjeta de cupones de GoodRx.  - Si necesita que su receta se enve electrnicamente a una farmacia diferente, informe a nuestra oficina a travs de MyChart de Elizabeth Mata   o por telfono llamando al [phone] y presione la opcin 4.  

## 2023-06-09 NOTE — Progress Notes (Signed)
   Follow-Up Visit   Subjective  Elizabeth Mata is a 62 y.o. female who presents for the following: Procedure (Milia extraction, lower lip, irritated.). Hasn't picked up the fade cream yet for the melasma.  Need to get photos today.   The following portions of the chart were reviewed this encounter and updated as appropriate:       Review of Systems:  No other skin or systemic complaints except as noted in HPI or Assessment and Plan.  Objective  Well appearing patient in no apparent distress; mood and affect are within normal limits.  A focused examination was performed including face. Relevant physical exam findings are noted in the Assessment and Plan.  lower lip Smooth white papules.         Assessment & Plan  Milia lower lip  Acne/Milia surgery - lower lip Procedure risks and benefits were discussed with the patient and verbal consent was obtained. Following prep of the skin on the lower lip with an alcohol swab and local anesthesia injection, extraction of milia was performed with cotton swabs following superficial incision made over their surfaces with a #11 blade. Capillary hemostasis was achieved with 20% aluminum chloride solution. Vaseline ointment was applied to each site. The patient tolerated the procedure well. Lower lip x 12, R paranasal x 1   MELASMA Exam: reticulated hyperpigmented patches at cheeks  Chronic and persistent condition with duration or expected duration over one year. Condition is symptomatic/ bothersome to patient. Not currently at goal.   Melasma is a chronic; persistent condition of hyperpigmented patches generally on the face, worse in summer due to higher UV exposure.    Heredity; thyroid disease; sun exposure; pregnancy; birth control pills; epilepsy medication and darker skin may predispose to Melasma.   Recommendations include: - Sun avoidance and daily broad spectrum (UVA/UVB) tinted mineral sunscreen SPF 30+, with Zinc or  Titanium Dioxide. - Rx topical bleaching creams (i.e. hydroquinone) is a common treatment but should not be used long term.  Hydroquinones may be mixed with retinoids; vitamin C; steroids; Kojic Acid. - Alastin A-luminate, retinoids, vitamin C, topical tranexamic acid, glycolic acid and kojic acid can be used for brightening while on break from hydroquinone - Rx Azelaic Acid is also a treatment option that is safe for pregnancy (Category B). - OTC Heliocare can be helpful in control and prevention. - Oral Rx with Tranexamic Acid 250 mg - 650 mg po daily can be used for moderate to severe cases especially during summer (contraindications include pregnancy; lactation; hx of PE; hx of DVT; clotting disorder; heart disease; anticoagulant use and upcoming long trips)   - Chemical peels (would need multiple for best result).  - Lasers and  Microdermabrasion may also be helpful adjunct treatments.  Treatment Plan: Start 4% hydroquinone cream BID to dark spots, no longer than 3 months at a time. Pt has Rx at pharmacy.   Return in 3-4 months for melasma follow-up.   ICherlyn Labella, CMA, am acting as scribe for Willeen Niece, MD .  Documentation: I have reviewed the above documentation for accuracy and completeness, and I agree with the above.  Willeen Niece MD

## 2023-06-23 NOTE — Progress Notes (Unsigned)
Danelle Berry, PA-C   No chief complaint on file.   HPI:      [name] is a 62 y.o. G2P2 whose LMP was No LMP recorded. Patient has had a hysterectomy., presents today for *** Had vulvar lesion 12/23 that was SCC on bx; sent to gyn onc. S/p excision.     Patient Active Problem List   Diagnosis Date Noted   Vulvar cancer (HCC) 12/18/2022   Mixed hyperlipidemia 09/16/2022   Pelvic floor weakness in female 03/03/2020   Polyp of ascending colon    Abdominal pain, chronic, epigastric    Carotid atherosclerosis, bilateral 11/06/2017   Hx of completed stroke 10/28/2017   Skin pigmentation disorder 07/24/2016   Allergic rhinitis 06/30/2016   Eczema 09/13/2015   Irritable bowel syndrome with constipation 09/13/2015   Seizures (HCC) 08/16/2015   Bickerstaff's migraine 07/06/2012    Past Surgical History:  Procedure Laterality Date   ABDOMINAL HYSTERECTOMY  40s   endometriosis, fibroid tumors; no cancer, partial   BREAST EXCISIONAL BIOPSY Left 1980'S   EXCISIONAL - NEG   BREAST SURGERY     CHOLECYSTECTOMY  31   COLONOSCOPY WITH PROPOFOL N/A 06/23/2019   Procedure: COLONOSCOPY WITH PROPOFOL;  Surgeon: Pasty Spillers, MD;  Location: [facility] ENDOSCOPY;  Service: Endoscopy;  Laterality: N/A;   COLONOSCOPY WITH PROPOFOL N/A 12/28/2020   Procedure: COLONOSCOPY WITH PROPOFOL;  Surgeon: Pasty Spillers, MD;  Location: [facility] ENDOSCOPY;  Service: Endoscopy;  Laterality: N/A;   ESOPHAGOGASTRODUODENOSCOPY (EGD) WITH PROPOFOL N/A 06/23/2019   Procedure: ESOPHAGOGASTRODUODENOSCOPY (EGD) WITH PROPOFOL;  Surgeon: Pasty Spillers, MD;  Location: [facility] ENDOSCOPY;  Service: Endoscopy;  Laterality: N/A;   ESOPHAGOGASTRODUODENOSCOPY (EGD) WITH PROPOFOL N/A 12/28/2020   Procedure: ESOPHAGOGASTRODUODENOSCOPY (EGD) WITH PROPOFOL;  Surgeon: Pasty Spillers, MD;  Location: [facility] ENDOSCOPY;  Service: Endoscopy;  Laterality: N/A;   ESOPHAGOGASTRODUODENOSCOPY (EGD) WITH PROPOFOL  N/A 12/27/2022   Procedure: ESOPHAGOGASTRODUODENOSCOPY (EGD) WITH PROPOFOL;  Surgeon: Wyline Mood, MD;  Location: [facility] ENDOSCOPY;  Service: Gastroenterology;  Laterality: N/A;   FOOT SURGERY Right    tendon release   VULVECTOMY PARTIAL N/A 01/01/2023   Procedure: EXAM UNDER ANESTHESIA,VULVECTOMY PARTIAL, COLPOSCOPY;  Surgeon: Artelia Laroche, MD;  Location: [facility] ORS;  Service: Gynecology;  Laterality: N/A;    Family History  Problem Relation Age of Onset   Coronary artery disease Maternal Uncle    Diabetes Mellitus II Mother    Arthritis Mother    Diabetes Maternal Uncle    Kidney cancer Father 51       died 08-May-2017   Kidney disease Father    Cancer Father        kidney and bladder   Arthritis Maternal Uncle    Cancer Maternal Grandmother        stomach?   Jaundice Maternal Grandmother    Stomach cancer Paternal Grandmother    Cancer Paternal Grandmother        stomach   Hypertension Sister    Stroke Maternal Grandfather    Diabetes Paternal Grandfather    Diabetes Sister    Hypertension Sister    Breast cancer Neg Hx    Bladder Cancer Neg Hx     Social History   Socioeconomic History   Marital status: Legally Separated    Spouse name: Not on file   Number of children: 2   Years of education: Not on file   Highest education level: GED or equivalent  Occupational History   Occupation: unemployed  Tobacco Use   Smoking status: Never    Passive exposure: Never   Smokeless tobacco: Never  Vaping Use   Vaping Use: Never used  Substance and Sexual Activity   Alcohol use: Yes    Comment: occasionally   Drug use: Never   Sexual activity: Yes    Partners: Male    Birth control/protection: Surgical    Comment: Hysterectomy  Other Topics Concern   Not on file  Social History Narrative   Regular exercise   Social Determinants of Health   Financial Resource Strain: Low Risk  (06/06/2023)   Overall Financial Resource Strain (CARDIA)    Difficulty of  Paying Living Expenses: Not hard at all  Food Insecurity: No Food Insecurity (06/06/2023)   Hunger Vital Sign    Worried About Running Out of Food in the Last Year: Never true    Ran Out of Food in the Last Year: Never true  Transportation Needs: No Transportation Needs (06/06/2023)   PRAPARE - Administrator, Civil Service (Medical): No    Lack of Transportation (Non-Medical): No  Physical Activity: Sufficiently Active (06/06/2023)   Exercise Vital Sign    Days of Exercise per Week: 7 days    Minutes of Exercise per Session: 30 min  Stress: No Stress Concern Present (06/06/2023)   [employer] of Occupational Health - Occupational Stress Questionnaire    Feeling of Stress : Not at all  Social Connections: Moderately Isolated (06/06/2023)   Social Connection and Isolation Panel [NHANES]    Frequency of Communication with Friends and Family: More than three times a week    Frequency of Social Gatherings with Friends and Family: Three times a week    Attends Religious Services: More than 4 times per year    Active Member of Clubs or Organizations: No    Attends Banker Meetings: Never    Marital Status: Separated  Intimate Partner Violence: Not At Risk (06/06/2023)   Humiliation, Afraid, Rape, and Kick questionnaire    Fear of Current or Ex-Partner: No    Emotionally Abused: No    Physically Abused: No    Sexually Abused: No    Outpatient Medications Prior to Visit  Medication Sig Dispense Refill   aspirin EC 81 MG EC tablet Take 1 tablet (81 mg total) by mouth daily. 30 tablet 1   atorvastatin (LIPITOR) 80 MG tablet Take 1 tablet (80 mg total) by mouth daily. 90 tablet 3   ezetimibe (ZETIA) 10 MG tablet Take 1 tablet (10 mg total) by mouth daily. 90 tablet 3   fexofenadine (ALLEGRA) 180 MG tablet Take 180 mg by mouth daily.     hydroquinone 4 % cream Apply twice daily to dark spots on face. Use no more than 3 months at a time. 28.35 g 3   NURTEC 75 MG TBDP Take  1 tablet by mouth as directed.     omeprazole (PRILOSEC) 40 MG capsule Take 1 capsule (40 mg total) by mouth daily as needed (epigastric pain, reflux, indigestion). 30 capsule 3   topiramate (TOPAMAX) 200 MG tablet Take 200 mg by mouth 2 (two) times daily.   11   No facility-administered medications prior to visit.      ROS:  Review of Systems BREAST: No symptoms   OBJECTIVE:   Vitals:  There were no vitals taken for this visit.  Physical Exam  Results: No results found for this or any previous visit (from the past 24 hour(s)).  Assessment/Plan: No diagnosis found.    No orders of the defined types were placed in this encounter.     No follow-ups on file.  Varian Innes B. Ricki Vanhandel, PA-C 06/23/2023 5:05 PM

## 2023-06-24 ENCOUNTER — Ambulatory Visit (INDEPENDENT_AMBULATORY_CARE_PROVIDER_SITE_OTHER): Payer: Medicare HMO | Admitting: Obstetrics and Gynecology

## 2023-06-24 ENCOUNTER — Encounter: Payer: Self-pay | Admitting: Obstetrics and Gynecology

## 2023-06-24 VITALS — BP 122/74 | Ht 59.0 in | Wt 139.0 lb

## 2023-06-24 DIAGNOSIS — R102 Pelvic and perineal pain: Secondary | ICD-10-CM | POA: Diagnosis not present

## 2023-06-24 DIAGNOSIS — Z85828 Personal history of other malignant neoplasm of skin: Secondary | ICD-10-CM | POA: Diagnosis not present

## 2023-06-24 DIAGNOSIS — L905 Scar conditions and fibrosis of skin: Secondary | ICD-10-CM | POA: Diagnosis not present

## 2023-06-24 DIAGNOSIS — D049 Carcinoma in situ of skin, unspecified: Secondary | ICD-10-CM

## 2023-06-24 NOTE — Patient Instructions (Signed)
I value your feedback and you entrusting us with your care. If you get a [place] patient survey, I would appreciate you taking the time to let us know about your experience today. Thank you! ? ? ?

## 2023-07-16 ENCOUNTER — Inpatient Hospital Stay: Payer: Medicare HMO

## 2023-07-21 DIAGNOSIS — Z809 Family history of malignant neoplasm, unspecified: Secondary | ICD-10-CM | POA: Diagnosis not present

## 2023-07-21 DIAGNOSIS — Z885 Allergy status to narcotic agent status: Secondary | ICD-10-CM | POA: Diagnosis not present

## 2023-07-21 DIAGNOSIS — E785 Hyperlipidemia, unspecified: Secondary | ICD-10-CM | POA: Diagnosis not present

## 2023-07-21 DIAGNOSIS — K219 Gastro-esophageal reflux disease without esophagitis: Secondary | ICD-10-CM | POA: Diagnosis not present

## 2023-07-21 DIAGNOSIS — Z008 Encounter for other general examination: Secondary | ICD-10-CM | POA: Diagnosis not present

## 2023-07-21 DIAGNOSIS — Z823 Family history of stroke: Secondary | ICD-10-CM | POA: Diagnosis not present

## 2023-07-21 DIAGNOSIS — R030 Elevated blood-pressure reading, without diagnosis of hypertension: Secondary | ICD-10-CM | POA: Diagnosis not present

## 2023-07-21 DIAGNOSIS — G250 Essential tremor: Secondary | ICD-10-CM | POA: Diagnosis not present

## 2023-07-21 DIAGNOSIS — Z882 Allergy status to sulfonamides status: Secondary | ICD-10-CM | POA: Diagnosis not present

## 2023-07-21 DIAGNOSIS — G40909 Epilepsy, unspecified, not intractable, without status epilepticus: Secondary | ICD-10-CM | POA: Diagnosis not present

## 2023-07-21 DIAGNOSIS — Z8249 Family history of ischemic heart disease and other diseases of the circulatory system: Secondary | ICD-10-CM | POA: Diagnosis not present

## 2023-07-21 DIAGNOSIS — K589 Irritable bowel syndrome without diarrhea: Secondary | ICD-10-CM | POA: Diagnosis not present

## 2023-07-21 DIAGNOSIS — Z8543 Personal history of malignant neoplasm of ovary: Secondary | ICD-10-CM | POA: Diagnosis not present

## 2023-08-06 ENCOUNTER — Encounter: Payer: Self-pay | Admitting: Obstetrics and Gynecology

## 2023-08-06 ENCOUNTER — Inpatient Hospital Stay: Payer: Medicare HMO | Attending: Obstetrics and Gynecology | Admitting: Obstetrics and Gynecology

## 2023-08-06 ENCOUNTER — Ambulatory Visit: Payer: Medicare HMO

## 2023-08-06 VITALS — BP 109/78 | HR 81 | Temp 96.8°F | Resp 18 | Wt 141.8 lb

## 2023-08-06 DIAGNOSIS — L910 Hypertrophic scar: Secondary | ICD-10-CM | POA: Diagnosis not present

## 2023-08-06 DIAGNOSIS — C519 Malignant neoplasm of vulva, unspecified: Secondary | ICD-10-CM | POA: Diagnosis not present

## 2023-08-06 DIAGNOSIS — Z9889 Other specified postprocedural states: Secondary | ICD-10-CM | POA: Diagnosis not present

## 2023-08-06 NOTE — Progress Notes (Signed)
Patient states since surgery has had a nagging, discomfort, and pain/feeling. She states she's also seeing puss and wondering if it caused an infection as well and having some pulling type of pain.

## 2023-08-06 NOTE — Progress Notes (Signed)
Gynecologic Oncology Consult Visit   Referring Provider: Althea Grimmer, PA   Chief Concern: SCC of vulva  Subjective:  Elizabeth Mata is a 62 y.o. female who is seen in consultation from [place], [place] [facility]) for newly diagnosed squamous cell carcinoma of the vulva, s/p WLE on 01/01/23.   She is concerned about the incision and has discomfort with wearing her underwear and pulling of the incision. She says she noted purulent discharge coming from the incisions around May 2024. She does have a h/o keloid formation from other incisions. She is worried that the surgery did not remove the correct area and concern that she may have another lesion other than what is seen by the keloid area.    Gynecologic Oncology History:  62 y.o. G2P2 whose LMP was No LMP recorded. Patient has had a hysterectomy., presents today for enlarging labial lesion. She had noticed what she thought was ingrown hair for several months which then became painful and enlarging. She attempted home I&D then sought evaluation with gynecology.     12/03/2022 biopsy performed - Specimen was 0.3 x 0.3 x 0.2 cm fragment  PATHOLOGY: A. LABIA, RIGHT LESION, BIOPSY: - Moderately differentiated squamous cell carcinoma - Carcinoma is present at biopsy edge  11/10/2022 CT A/P IMPRESSION: No acute findings in the abdomen or pelvis.   HIV nonreactive 08/09/2019  No gross lesion identified on initial exam with Dr. Sonia Side.   01/01/23- EUA, colposcopy, and WLE of vulva. Lesion visualized during colposcopy. Defect was approximately 3 x 1.5 cm. Stitch placed at 12:00.   Pathology:  A. VULVA, RIGHT; BIOPSY:  - DERMAL FIBROSIS, CONSISTENT WITH PREVIOUS BIOPSY SITE.  - NO RESIDUAL SQUAMOUS CELL CARCINOMA IDENTIFIED (REF OUTSIDE PATHOLOGY  REPORT; [phone]).   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    01/2023 Postop visit. Incision noted to be well healed.  Recommended observation and review of pathology at [facility].   Problem List: Patient Active Problem List   Diagnosis Date Noted   Vulvar cancer (HCC) 12/18/2022   Mixed hyperlipidemia 09/16/2022   Pelvic floor weakness in female 03/03/2020   Polyp of ascending colon    Abdominal pain, chronic, epigastric    Carotid atherosclerosis, bilateral 11/06/2017   Hx of completed stroke 10/28/2017   Skin pigmentation disorder 07/24/2016   Allergic rhinitis 06/30/2016   Eczema 09/13/2015   Irritable bowel syndrome with constipation 09/13/2015   Seizures (HCC) 08/16/2015   Bickerstaff's migraine 07/06/2012    Past Medical History: Past Medical History:  Diagnosis Date   Allergic rhinitis 06/30/2016   Allergy    Carotid atherosclerosis, bilateral 11/06/2017   [place] Nov 2018   Cyst of breast, left, solitary    Flank pain    GERD (gastroesophageal reflux disease)    Hemorrhoid    Hemorrhoids    Hyperlipidemia    IFG (impaired fasting glucose) 07/18/2016   June 2017    Kidney stone on left side 10/24/2015   Kidney stones    Neuropathy 07/24/2016   Seizures (HCC)    Shingles outbreak 02/03/2017   Stomach irritation    Syncope    Recurrent over the last 2 years. had workup at [facility] this year with cardiology and neurology. Had echo and 3-week event monitor that were unremarkable. Has had negative EEG. Echo in 11/11 at [facility] showed no significant abnormalities. Possibly vasovagal   TIA (transient ischemic attack) 10/28/2017   Trigger thumb     Past Surgical  History: Past Surgical History:  Procedure Laterality Date   ABDOMINAL HYSTERECTOMY  40s   endometriosis, fibroid tumors; no cancer, partial   BREAST EXCISIONAL BIOPSY Left 1980'S   EXCISIONAL - NEG   BREAST SURGERY     CHOLECYSTECTOMY  31   COLONOSCOPY WITH PROPOFOL N/A 06/23/2019   Procedure: COLONOSCOPY WITH PROPOFOL;  Surgeon: Pasty Spillers, MD;  Location: [facility] ENDOSCOPY;  Service: Endoscopy;  Laterality: N/A;    COLONOSCOPY WITH PROPOFOL N/A 12/28/2020   Procedure: COLONOSCOPY WITH PROPOFOL;  Surgeon: Pasty Spillers, MD;  Location: [facility] ENDOSCOPY;  Service: Endoscopy;  Laterality: N/A;   ESOPHAGOGASTRODUODENOSCOPY (EGD) WITH PROPOFOL N/A 06/23/2019   Procedure: ESOPHAGOGASTRODUODENOSCOPY (EGD) WITH PROPOFOL;  Surgeon: Pasty Spillers, MD;  Location: [facility] ENDOSCOPY;  Service: Endoscopy;  Laterality: N/A;   ESOPHAGOGASTRODUODENOSCOPY (EGD) WITH PROPOFOL N/A 12/28/2020   Procedure: ESOPHAGOGASTRODUODENOSCOPY (EGD) WITH PROPOFOL;  Surgeon: Pasty Spillers, MD;  Location: [facility] ENDOSCOPY;  Service: Endoscopy;  Laterality: N/A;   ESOPHAGOGASTRODUODENOSCOPY (EGD) WITH PROPOFOL N/A 12/27/2022   Procedure: ESOPHAGOGASTRODUODENOSCOPY (EGD) WITH PROPOFOL;  Surgeon: Wyline Mood, MD;  Location: [facility] ENDOSCOPY;  Service: Gastroenterology;  Laterality: N/A;   FOOT SURGERY Right    tendon release   VULVECTOMY PARTIAL N/A 01/01/2023   Procedure: EXAM UNDER ANESTHESIA,VULVECTOMY PARTIAL, COLPOSCOPY;  Surgeon: Artelia Laroche, MD;  Location: [facility] ORS;  Service: Gynecology;  Laterality: N/A;    Past Gynecologic History:  As per HPI  OB History:  OB History  Gravida Para Term Preterm AB Living  2 2       2   SAB IAB Ectopic Multiple Live Births               # Outcome Date GA Lbr Len/2nd Weight Sex Type Anes PTL Lv  2 Para           1 Para             Obstetric Comments  Menstrual age: 21    Age 1st Pregnancy: 10    Family History: Family History  Problem Relation Age of Onset   Coronary artery disease Maternal Uncle    Diabetes Mellitus II Mother    Arthritis Mother    Diabetes Maternal Uncle    Kidney cancer Father 82       died 27-Apr-2017   Kidney disease Father    Cancer Father        kidney and bladder   Arthritis Maternal Uncle    Cancer Maternal Grandmother        stomach?   Jaundice Maternal Grandmother    Stomach cancer Paternal Grandmother    Cancer Paternal  Grandmother        stomach   Hypertension Sister    Stroke Maternal Grandfather    Diabetes Paternal Grandfather    Diabetes Sister    Hypertension Sister    Breast cancer Neg Hx    Bladder Cancer Neg Hx     Social History: Social History   Socioeconomic History   Marital status: Legally Separated    Spouse name: Not on file   Number of children: 2   Years of education: Not on file   Highest education level: GED or equivalent  Occupational History   Occupation: unemployed  Tobacco Use   Smoking status: Never    Passive exposure: Never   Smokeless tobacco: Never  Vaping Use   Vaping status: Never Used  Substance and Sexual Activity   Alcohol use: Yes    Comment:  occasionally   Drug use: Never   Sexual activity: Yes    Partners: Male    Birth control/protection: Surgical    Comment: Hysterectomy  Other Topics Concern   Not on file  Social History Narrative   Regular exercise   Social Determinants of Health   Financial Resource Strain: Low Risk  (06/06/2023)   Overall Financial Resource Strain (CARDIA)    Difficulty of Paying Living Expenses: Not hard at all  Food Insecurity: No Food Insecurity (06/06/2023)   Hunger Vital Sign    Worried About Running Out of Food in the Last Year: Never true    Ran Out of Food in the Last Year: Never true  Transportation Needs: No Transportation Needs (06/06/2023)   PRAPARE - Administrator, Civil Service (Medical): No    Lack of Transportation (Non-Medical): No  Physical Activity: Sufficiently Active (06/06/2023)   Exercise Vital Sign    Days of Exercise per Week: 7 days    Minutes of Exercise per Session: 30 min  Stress: No Stress Concern Present (06/06/2023)   [employer] of Occupational Health - Occupational Stress Questionnaire    Feeling of Stress : Not at all  Social Connections: Moderately Isolated (06/06/2023)   Social Connection and Isolation Panel [NHANES]    Frequency of Communication with Friends and  Family: More than three times a week    Frequency of Social Gatherings with Friends and Family: Three times a week    Attends Religious Services: More than 4 times per year    Active Member of Clubs or Organizations: No    Attends Banker Meetings: Never    Marital Status: Separated  Intimate Partner Violence: Not At Risk (06/06/2023)   Humiliation, Afraid, Rape, and Kick questionnaire    Fear of Current or Ex-Partner: No    Emotionally Abused: No    Physically Abused: No    Sexually Abused: No    Allergies: Allergies  Allergen Reactions   Clindamycin/Lincomycin Hives   Hydrocodone Nausea Only   Metronidazole Hives   Sulfa Antibiotics Hives    Break out in hives all over   Oxycodone Nausea And Vomiting    Current Medications: Current Outpatient Medications  Medication Sig Dispense Refill   aspirin EC 81 MG EC tablet Take 1 tablet (81 mg total) by mouth daily. 30 tablet 1   atorvastatin (LIPITOR) 80 MG tablet Take 1 tablet (80 mg total) by mouth daily. 90 tablet 3   ezetimibe (ZETIA) 10 MG tablet Take 1 tablet (10 mg total) by mouth daily. 90 tablet 3   fexofenadine (ALLEGRA) 180 MG tablet Take 180 mg by mouth daily.     omeprazole (PRILOSEC) 40 MG capsule Take 1 capsule (40 mg total) by mouth daily as needed (epigastric pain, reflux, indigestion). 30 capsule 3   Rimegepant Sulfate (NURTEC) 75 MG TBDP Take by mouth.     topiramate (TOPAMAX) 200 MG tablet Take 200 mg by mouth 2 (two) times daily.   11   hydroquinone 4 % cream Apply twice daily to dark spots on face. Use no more than 3 months at a time. (Patient not taking: Reported on 08/06/2023) 28.35 g 3   No current facility-administered medications for this visit.    Review of Systems General: no complaints  HEENT: no complaints  Lungs: no complaints  Cardiac: no complaints  GI: no complaints  GU: no complaints  Musculoskeletal: no complaints  Extremities: no complaints  Skin: no complaints  Neuro: no  complaints  Endocrine: no complaints  Psych: no complaints        Objective:  Physical Examination:  BP 109/78 (BP Location: Right Arm, Patient Position: Sitting, Cuff Size: Normal)   Pulse 81   Temp (!) 96.8 F (36 C) (Tympanic)   Resp 18   Wt 141 lb 12.8 oz (64.3 kg)   SpO2 100%   BMI 28.64 kg/m    ECOG Performance Status: 0 - Asymptomatic  GENERAL: Patient is a well appearing female in no acute distress HEENT:  Atraumatic and normocephalic.  NODES:  No cervical, supraclavicular, axillary, or inguinal lymphadenopathy palpated.  LUNGS:  Normal respiratory effort ABDOMEN:  Soft, nontender. Nondistended. No masses/ascites/hernia/or hepatomegaly.  EXTREMITIES:  No peripheral edema.   NEURO:  Nonfocal. Well oriented.  Appropriate affect.  Pelvic: EGBUS: healed incision on the right vulva. 2-3 cm in length with what appears to be raised keloidal area at the superior aspect of the incision. No gross cancerous appearing lesions. Cervix: surgically absent Vagina: no lesions, no discharge or bleeding Uterus: surgically absent BME: On palpation there are several paravaginal cystic lesions ~ 8 mm in size.     Lab Review Per hpi  Radiologic Imaging: Per HPI     Assessment:  CHARM BROTHER is a 62 y.o. 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 01/01/23- EUA, colposcopy, and WLE of vulva. No residual dysplasia or cancer in the pathology specimen. 07/2023 concern for keloid formation  H/o hypercalcemia  Medical co-morbidities complicating care: h/o Carotid atherosclerosis, bilateral with TIA 2018; Hyperlipidemia and h/o seizures on therapy Plan:   Problem List Items Addressed This Visit       Genitourinary   Vulvar cancer (HCC) - Primary    We discussed that no further treatment was recommended in view of the lack of residual disease in the surgical specimen.  I have requested to have Dr. Oneita Kras pathology to  review the original slides and surgical specimen to see if she really had invasive cancer, as opposed to VIN.    Dermatology referral to assess and provide recommendations. Are they keloidal areas? Recommendations for keloid management if so versus need for another biopsy.   Return to clinic in  5 weeks to review dermatology recommendations and consider vulvar colposcopy.    The patient's diagnosis, an outline of the further diagnostic and laboratory studies which will be required, the recommendation, and alternatives were discussed.  All questions were answered to the patient's satisfaction.  I personally interviewed and examined the patient. Agreed with the above/below plan of care. I have directly contributed to assessment and plan of care of this patient and educated and discussed with patient and family.    Leta Jungling, MD    CC:  Althea Grimmer, [place]

## 2023-08-08 ENCOUNTER — Telehealth: Payer: Self-pay

## 2023-08-08 NOTE — Telephone Encounter (Signed)
Dr. Oneita Kras from pathology has reviewed 12/03/2022 biopsy and confirmed pathology.

## 2023-08-14 ENCOUNTER — Telehealth: Payer: Self-pay | Admitting: Nurse Practitioner

## 2023-08-14 DIAGNOSIS — L910 Hypertrophic scar: Secondary | ICD-10-CM

## 2023-08-14 NOTE — Telephone Encounter (Signed)
Called patient to review dermatologist's options. No answer. Left voicemail to return call.

## 2023-09-02 DIAGNOSIS — H524 Presbyopia: Secondary | ICD-10-CM | POA: Diagnosis not present

## 2023-09-02 DIAGNOSIS — H2513 Age-related nuclear cataract, bilateral: Secondary | ICD-10-CM | POA: Diagnosis not present

## 2023-09-02 DIAGNOSIS — H04123 Dry eye syndrome of bilateral lacrimal glands: Secondary | ICD-10-CM | POA: Diagnosis not present

## 2023-09-03 ENCOUNTER — Other Ambulatory Visit: Payer: Self-pay | Admitting: Nurse Practitioner

## 2023-09-03 ENCOUNTER — Inpatient Hospital Stay: Payer: Medicare HMO | Attending: Obstetrics and Gynecology | Admitting: Obstetrics and Gynecology

## 2023-09-03 VITALS — BP 110/76 | HR 71 | Temp 97.9°F | Resp 20 | Wt 141.1 lb

## 2023-09-03 DIAGNOSIS — Z9079 Acquired absence of other genital organ(s): Secondary | ICD-10-CM | POA: Diagnosis not present

## 2023-09-03 DIAGNOSIS — C519 Malignant neoplasm of vulva, unspecified: Secondary | ICD-10-CM | POA: Diagnosis not present

## 2023-09-03 DIAGNOSIS — L910 Hypertrophic scar: Secondary | ICD-10-CM | POA: Insufficient documentation

## 2023-09-03 NOTE — Progress Notes (Signed)
Gynecologic Oncology Interval Visit   Referring Provider: Althea Grimmer, PA   Chief Concern: SCC of vulva  Subjective:  Elizabeth Mata is a 62 y.o. female who is seen in consultation from [place], [facility]) for newly diagnosed squamous cell carcinoma of the vulva, s/p WLE on 01/01/23.  At her last visit we asked Pathology to review her vulvar biopsy. Dr. Oneita Kras from pathology has reviewed 12/03/2022 biopsy and confirmed pathology consistent with malignancy.   Elizabeth Mata reviewed recommendations with dermatology and they recommended a 3 month treatment of twice a day [place] for [place]. The patient is also interested in biopsying the area given her symptoms and concern for recurrence.   Gynecologic Oncology History:  62 y.o. G2P2 whose LMP was No LMP recorded. Patient has had a hysterectomy., presents today for enlarging labial lesion. She had noticed what she thought was ingrown hair for several months which then became painful and enlarging. She attempted home I&D then sought evaluation with gynecology.     12/03/2022 biopsy performed - Specimen was 0.3 x 0.3 x 0.2 cm fragment  PATHOLOGY: A. LABIA, RIGHT LESION, BIOPSY: - Moderately differentiated squamous cell carcinoma - Carcinoma is present at biopsy edge  11/10/2022 CT A/P IMPRESSION: No acute findings in the abdomen or pelvis.   HIV nonreactive 08/09/2019  No gross lesion identified on initial exam with Dr. Sonia Side.   01/01/23- EUA, colposcopy, and WLE of vulva. Lesion visualized during colposcopy. Defect was approximately 3 x 1.5 cm. Stitch placed at 12:00.   Pathology:  A. VULVA, RIGHT; BIOPSY:  - DERMAL FIBROSIS, CONSISTENT WITH PREVIOUS BIOPSY SITE.  - NO RESIDUAL SQUAMOUS CELL CARCINOMA IDENTIFIED (REF OUTSIDE PATHOLOGY  REPORT; [id number]).   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    01/2023 Postop visit. Incision noted to be well healed.  Recommended observation and review of pathology at [facility].   Problem List: Patient Active Problem List   Diagnosis Date Noted   Vulvar cancer (HCC) 12/18/2022   Mixed hyperlipidemia 09/16/2022   Pelvic floor weakness in female 03/03/2020   Polyp of ascending colon    Abdominal pain, chronic, epigastric    Carotid atherosclerosis, bilateral 11/06/2017   Hx of completed stroke 10/28/2017   Skin pigmentation disorder 07/24/2016   Allergic rhinitis 06/30/2016   Eczema 09/13/2015   Irritable bowel syndrome with constipation 09/13/2015   Seizures (HCC) 08/16/2015   Bickerstaff's migraine 07/06/2012    Past Medical History: Past Medical History:  Diagnosis Date   Allergic rhinitis 06/30/2016   Allergy    Carotid atherosclerosis, bilateral 11/06/2017   [place] Nov 2018   Cyst of breast, left, solitary    Flank pain    GERD (gastroesophageal reflux disease)    Hemorrhoid    Hemorrhoids    Hyperlipidemia    IFG (impaired fasting glucose) 07/18/2016   June 2017    Kidney stone on left side 10/24/2015   Kidney stones    Neuropathy 07/24/2016   Seizures (HCC)    Shingles outbreak 02/03/2017   Stomach irritation    Syncope    Recurrent over the last 2 years. had workup at [facility] this year with cardiology and neurology. Had echo and 3-week event monitor that were unremarkable. Has had negative EEG. Echo in 11/11 at [facility] showed no significant abnormalities. Possibly vasovagal   TIA (transient ischemic attack) 10/28/2017   Trigger thumb     Past Surgical History: Past Surgical History:  Procedure Laterality Date  ABDOMINAL HYSTERECTOMY  40s   endometriosis, fibroid tumors; no cancer, partial   BREAST EXCISIONAL BIOPSY Left 1980'S   EXCISIONAL - NEG   BREAST SURGERY     CHOLECYSTECTOMY  31   COLONOSCOPY WITH PROPOFOL N/A 06/23/2019   Procedure: COLONOSCOPY WITH PROPOFOL;  Surgeon: Pasty Spillers, MD;  Location: [facility] ENDOSCOPY;  Service: Endoscopy;  Laterality: N/A;    COLONOSCOPY WITH PROPOFOL N/A 12/28/2020   Procedure: COLONOSCOPY WITH PROPOFOL;  Surgeon: Pasty Spillers, MD;  Location: [facility] ENDOSCOPY;  Service: Endoscopy;  Laterality: N/A;   ESOPHAGOGASTRODUODENOSCOPY (EGD) WITH PROPOFOL N/A 06/23/2019   Procedure: ESOPHAGOGASTRODUODENOSCOPY (EGD) WITH PROPOFOL;  Surgeon: Pasty Spillers, MD;  Location: [facility] ENDOSCOPY;  Service: Endoscopy;  Laterality: N/A;   ESOPHAGOGASTRODUODENOSCOPY (EGD) WITH PROPOFOL N/A 12/28/2020   Procedure: ESOPHAGOGASTRODUODENOSCOPY (EGD) WITH PROPOFOL;  Surgeon: Pasty Spillers, MD;  Location: [facility] ENDOSCOPY;  Service: Endoscopy;  Laterality: N/A;   ESOPHAGOGASTRODUODENOSCOPY (EGD) WITH PROPOFOL N/A 12/27/2022   Procedure: ESOPHAGOGASTRODUODENOSCOPY (EGD) WITH PROPOFOL;  Surgeon: Wyline Mood, MD;  Location: [facility] ENDOSCOPY;  Service: Gastroenterology;  Laterality: N/A;   FOOT SURGERY Right    tendon release   VULVECTOMY PARTIAL N/A 01/01/2023   Procedure: EXAM UNDER ANESTHESIA,VULVECTOMY PARTIAL, COLPOSCOPY;  Surgeon: Artelia Laroche, MD;  Location: [facility] ORS;  Service: Gynecology;  Laterality: N/A;    Past Gynecologic History:  As per HPI  OB History:  OB History  Gravida Para Term Preterm AB Living  2 2       2   SAB IAB Ectopic Multiple Live Births               # Outcome Date GA Lbr Len/2nd Weight Sex Type Anes PTL Lv  2 Para           1 Para             Obstetric Comments  Menstrual age: 61    Age 1st Pregnancy: 52    Family History: Family History  Problem Relation Age of Onset   Coronary artery disease Maternal Uncle    Diabetes Mellitus II Mother    Arthritis Mother    Diabetes Maternal Uncle    Kidney cancer Father 35       died 2017/05/04   Kidney disease Father    Cancer Father        kidney and bladder   Arthritis Maternal Uncle    Cancer Maternal Grandmother        stomach?   Jaundice Maternal Grandmother    Stomach cancer Paternal Grandmother    Cancer Paternal  Grandmother        stomach   Hypertension Sister    Stroke Maternal Grandfather    Diabetes Paternal Grandfather    Diabetes Sister    Hypertension Sister    Breast cancer Neg Hx    Bladder Cancer Neg Hx     Social History: Social History   Socioeconomic History   Marital status: Legally Separated    Spouse name: Not on file   Number of children: 2   Years of education: Not on file   Highest education level: GED or equivalent  Occupational History   Occupation: unemployed  Tobacco Use   Smoking status: Never    Passive exposure: Never   Smokeless tobacco: Never  Vaping Use   Vaping status: Never Used  Substance and Sexual Activity   Alcohol use: Yes    Comment: occasionally   Drug use: Never   Sexual activity:  Yes    Partners: Male    Birth control/protection: Surgical    Comment: Hysterectomy  Other Topics Concern   Not on file  Social History Narrative   Regular exercise   Social Determinants of Health   Financial Resource Strain: Low Risk  (06/06/2023)   Overall Financial Resource Strain (CARDIA)    Difficulty of Paying Living Expenses: Not hard at all  Food Insecurity: No Food Insecurity (06/06/2023)   Hunger Vital Sign    Worried About Running Out of Food in the Last Year: Never true    Ran Out of Food in the Last Year: Never true  Transportation Needs: No Transportation Needs (06/06/2023)   PRAPARE - Administrator, Civil Service (Medical): No    Lack of Transportation (Non-Medical): No  Physical Activity: Sufficiently Active (06/06/2023)   Exercise Vital Sign    Days of Exercise per Week: 7 days    Minutes of Exercise per Session: 30 min  Stress: No Stress Concern Present (06/06/2023)   [employer] of Occupational Health - Occupational Stress Questionnaire    Feeling of Stress : Not at all  Social Connections: Moderately Isolated (06/06/2023)   Social Connection and Isolation Panel [NHANES]    Frequency of Communication with Friends and  Family: More than three times a week    Frequency of Social Gatherings with Friends and Family: Three times a week    Attends Religious Services: More than 4 times per year    Active Member of Clubs or Organizations: No    Attends Banker Meetings: Never    Marital Status: Separated  Intimate Partner Violence: Not At Risk (06/06/2023)   Humiliation, Afraid, Rape, and Kick questionnaire    Fear of Current or Ex-Partner: No    Emotionally Abused: No    Physically Abused: No    Sexually Abused: No    Allergies: Allergies  Allergen Reactions   Clindamycin/Lincomycin Hives   Hydrocodone Nausea Only   Metronidazole Hives   Sulfa Antibiotics Hives    Break out in hives all over   Oxycodone Nausea And Vomiting    Current Medications: Current Outpatient Medications  Medication Sig Dispense Refill   aspirin EC 81 MG EC tablet Take 1 tablet (81 mg total) by mouth daily. 30 tablet 1   atorvastatin (LIPITOR) 80 MG tablet Take 1 tablet (80 mg total) by mouth daily. 90 tablet 3   ezetimibe (ZETIA) 10 MG tablet Take 1 tablet (10 mg total) by mouth daily. 90 tablet 3   fexofenadine (ALLEGRA) 180 MG tablet Take 180 mg by mouth daily.     omeprazole (PRILOSEC) 40 MG capsule Take 1 capsule (40 mg total) by mouth daily as needed (epigastric pain, reflux, indigestion). 30 capsule 3   Rimegepant Sulfate (NURTEC) 75 MG TBDP Take by mouth.     topiramate (TOPAMAX) 200 MG tablet Take 200 mg by mouth 2 (two) times daily.   11   hydroquinone 4 % cream Apply twice daily to dark spots on face. Use no more than 3 months at a time. (Patient not taking: Reported on 08/06/2023) 28.35 g 3   No current facility-administered medications for this visit.    Review of Systems General: no complaints  HEENT: no complaints  Lungs: cough o/w no complaints  Cardiac: no complaints  GI: no complaints  GU: vulvar irritation and two lump at the incisional scar o/w no complaints  Musculoskeletal: back pain o/w  no complaints  Extremities: no complaints  Skin: yes as noted in GU  Neuro: no complaints  Endocrine: no complaints  Psych: no complaints        Objective:  Physical Examination:  BP 110/76   Pulse 71   Temp 97.9 F (36.6 C)   Resp 20   Wt 141 lb 1.6 oz (64 kg)   SpO2 100%   BMI 28.50 kg/m    ECOG Performance Status: 0 - Asymptomatic   Pelvic: deferred from 08/06/2023 EGBUS: healed incision on the right vulva. 2-3 cm in length with what appears to be raised keloidal area at the superior aspect of the incision - overall stable. No gross cancerous appearing lesions.  Remainder of exam deferred: Cervix: surgically absent Vagina: no lesions, no discharge or bleeding Uterus: surgically absent BME: On palpation there are several paravaginal cystic lesions ~ 8 mm in size.   Vulvar Biopsy Procedure The risks and benefits of the procedure were reviewed and informed consent obtained. Time out was performed. The patient received pre-procedure teaching and expressed understanding. The post-procedure instructions were reviewed with the patient and she expressed understanding. The patient does not have any barriers to learning. Colposcopy was performed of the vulvar after application of acetic acid. A small area of AWE was seen below the second inferior raised area.    Lidocaine gel was applied for 5 minutes before the biopsy. The areas were numbed injected with 3cc 2% lidocaine. A biopsy forceps was used to obtained the three biopsies - superior, middile and inferior (inferior included the area of AWE).  Hemostasis was obtained with silver nitrate. She tolerated the procedure well.    Lab Review Per hpi  Radiologic Imaging: Per HPI     Assessment:  Elizabeth Mata is a 62 y.o. 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 01/01/23- EUA, colposcopy, and WLE of vulva. No residual dysplasia or cancer in the pathology specimen.  07/2023 concern for keloid formation  H/o hypercalcemia  Medical co-morbidities complicating care: h/o Carotid atherosclerosis, bilateral with TIA 2018; Hyperlipidemia and h/o seizures on therapy Plan:   Problem List Items Addressed This Visit       Genitourinary   Vulvar cancer (HCC)   Other Visit Diagnoses     Keloid    -  Primary        We discussed that biopsies will be followed up and determine if further intervention needed. Keloid treatment was recommended and Elizabeth Masse, NP reviewed applications.   Dermatology appointment is scheduled for Oct 2024.   Return to clinic in 2 months to review dermatology recommendations and how treatment for keloids is progressing.   The patient's diagnosis, an outline of the further diagnostic and laboratory studies which will be required, the recommendation, and alternatives were discussed.  All questions were answered to the patient's satisfaction.  I personally interviewed and examined the patient. Agreed with the above/below plan of care. I have directly contributed to assessment and plan of care of this patient and educated and discussed with patient and family.   Elizabeth Perfetti Leta Jungling, MD    CC:  Althea Grimmer, [place]

## 2023-09-03 NOTE — Patient Instructions (Signed)
For your keloid, I recommend topical application of Serica Silicone scar gel twice a day. This product can be purchased online or at [employer] or chain retailers.   [email or web address].[email or web address]  Apply the gel per package instructions twice a day and allow to dry completely. It may take some time to dry and you can use a hair dryer to help with drying time. It may take several weeks to months for you to notice results. You can see your dermatologist as well who may consider injections or other treatments.

## 2023-09-16 ENCOUNTER — Other Ambulatory Visit: Payer: Self-pay | Admitting: Family Medicine

## 2023-09-16 DIAGNOSIS — Z1231 Encounter for screening mammogram for malignant neoplasm of breast: Secondary | ICD-10-CM

## 2023-09-23 ENCOUNTER — Telehealth: Payer: Self-pay

## 2023-09-23 MED ORDER — TACROLIMUS 0.1 % EX OINT: TOPICAL_OINTMENT | CUTANEOUS | 0 refills | Status: DC | PRN

## 2023-09-23 NOTE — Telephone Encounter (Signed)
Patient advised and RX sent in. aw

## 2023-09-23 NOTE — Telephone Encounter (Signed)
Patient called about atopic derm flare on face. She has been seen within the last year but >1 year since atopic derm discussed.  Patient asking can she have RF of Tacrolimus or does she need to be seen?

## 2023-10-01 ENCOUNTER — Ambulatory Visit: Payer: Medicare HMO | Admitting: Family Medicine

## 2023-10-01 DIAGNOSIS — Z124 Encounter for screening for malignant neoplasm of cervix: Secondary | ICD-10-CM

## 2023-10-02 ENCOUNTER — Ambulatory Visit
Admission: RE | Admit: 2023-10-02 | Discharge: 2023-10-02 | Disposition: A | Discharge status: Home / Self Care | Payer: Medicare HMO | Source: Ambulatory Visit | Attending: Family Medicine | Admitting: Family Medicine

## 2023-10-02 DIAGNOSIS — Z1231 Encounter for screening mammogram for malignant neoplasm of breast: Secondary | ICD-10-CM | POA: Insufficient documentation

## 2023-10-20 ENCOUNTER — Other Ambulatory Visit: Payer: Self-pay | Admitting: Dermatology

## 2023-10-20 ENCOUNTER — Ambulatory Visit (INDEPENDENT_AMBULATORY_CARE_PROVIDER_SITE_OTHER): Payer: Medicare HMO | Admitting: Dermatology

## 2023-10-20 DIAGNOSIS — L309 Dermatitis, unspecified: Secondary | ICD-10-CM

## 2023-10-20 DIAGNOSIS — L811 Chloasma: Secondary | ICD-10-CM | POA: Diagnosis not present

## 2023-10-20 MED ORDER — PIMECROLIMUS 1 % EX CREA: TOPICAL_CREAM | CUTANEOUS | 2 refills | Status: DC

## 2023-10-20 MED ORDER — MOMETASONE FUROATE 0.1 % EX CREA: TOPICAL_CREAM | CUTANEOUS | 3 refills | Status: AC

## 2023-10-20 MED ORDER — FLUOCINOLONE ACETONIDE 0.01 % OT OIL: TOPICAL_OIL | OTIC | 11 refills | Status: DC

## 2023-10-20 NOTE — Progress Notes (Signed)
   Follow-Up Visit   Subjective  Elizabeth Mata is a 62 y.o. female who presents for the following: patient here today concerning history of eczema. Patient reports flares inside ears, face, hands and feet. Continues to have itching.  Has been using tacrolimus ointment but would like to discuss other treatment. She is not interested in treating melasma- [facility] mix burns skin.  The patient has spots, moles and lesions to be evaluated, some may be new or changing and the patient may have concern these could be cancer.   The following portions of the chart were reviewed this encounter and updated as appropriate: medications, allergies, medical history  Review of Systems:  No other skin or systemic complaints except as noted in HPI or Assessment and Plan.  Objective  Well appearing patient in no apparent distress; mood and affect are within normal limits.  A focused examination was performed of the following areas: face, scalp, b/l ears, hands   Relevant exam findings are noted in the Assessment and Plan.    Assessment & Plan   MELASMA Exam: reticulated hyperpigmented patches at face  She is not interested in pursuing treatment for this at this time.  Not bothersome to her, and topical Rx irritated her skin  Treatment Plan: D/c skin medicinals hydroquinone mix Continue SPF Observation   Dermatitis (Seborrheic vs Atopic) Exam:Xerosis with mild lichenification at hands, left > right, face/ears clear today, but pt reports itching  Chronic and persistent condition with duration or expected duration over one year. Condition is symptomatic/ bothersome to patient. Not currently at goal.   Atopic dermatitis (eczema) is a chronic, relapsing, pruritic condition that can significantly affect quality of life. It is often associated with allergic rhinitis and/or asthma and can require treatment with topical medications, phototherapy, or in severe cases biologic injectable medication  (Dupixent; Adbry) or Oral JAK inhibitors.   Treatment Plan:  For flares at ears Start fluocinolone acetonide 0.01 % oil  - apply topically 1 - 2 drops qd/bid daily as needed for itch inside ears  For face and body  Start Elidil cream - use topically to any affected areas on body and face 1-2 times daily  If not covered by insurance, will continue tacrolimus 0.1% ointment  For flares at body, hands and feet  Start mometasone cream - apply topically to 2 times daily for 2 weeks for flares at body. Avoid applying to face, groin, and axilla. Use as directed. Long-term use can cause thinning of the skin.  Use gentle soap and moisturizers when washing hands  - gentle skin care handout given   Dermatitis  Related Medications mometasone (ELOCON) 0.1 % cream Apply topically to flares at body qd/bid prn for up to 2 weeks. Avoid applying to face, groin, and axilla. Use as directed.  pimecrolimus (ELIDEL) 1 % cream Apply topically to itchy flares on face and body qd/bid prn  Fluocinolone Acetonide 0.01 % OIL Apply topically 1 - 2 drops qd/bid prn for itch inside ears    Return for 3 - 4 month dermatitis / eczema .  I, Asher Muir, CMA, am acting as scribe for Willeen Niece, MD.   Documentation: I have reviewed the above documentation for accuracy and completeness, and I agree with the above.  Willeen Niece, MD

## 2023-10-20 NOTE — Patient Instructions (Addendum)
Stop tacrolimus ointment     For flares at ears  Start fluocinolone acetonide 0.01 % oil  - apply topically 1 - 2 drops one to 1 to  2 times daily as needed for itch at inside ears  For flares at body like hands and feet  Start mometasone cream - apply topically to 2 times daily for 2 weeks for flares at body. Avoid applying to face, groin, and axilla. Use as directed. Long-term use can cause thinning of the skin.  Topical steroids (such as triamcinolone, fluocinolone, fluocinonide, mometasone, clobetasol, halobetasol, betamethasone, hydrocortisone) can cause thinning and lightening of the skin if they are used for too long in the same area. Your physician has selected the right strength medicine for your problem and area affected on the body. Please use your medication only as directed by your physician to prevent side effects.    Safe for face and body  Start elidil cream - use to any affected areas on body and face 1 / 2 times daily   Use gentle soap and moisturizers when washing hands   Gentle Skin Care Guide  1. Bathe no more than once a day.  2. Avoid bathing in hot water  3. Use a mild soap like Dove, Vanicream, Cetaphil, CeraVe. Can use Lever 2000 or Cetaphil antibacterial soap  4. Use soap only where you need it. On most days, use it under your arms, between your legs, and on your feet. Let the water rinse other areas unless visibly dirty.  5. When you get out of the bath/shower, use a towel to gently blot your skin dry, don't rub it.  6. While your skin is still a little damp, apply a moisturizing cream such as Vanicream, CeraVe, Cetaphil, Eucerin, Sarna lotion or plain Vaseline Jelly. For hands apply Neutrogena [place] Hand Cream or Excipial Hand Cream.  7. Reapply moisturizer any time you start to itch or feel dry.  8. Sometimes using free and clear laundry detergents can be helpful. Fabric softener sheets should be avoided. Downy Free & Gentle liquid, or any liquid  fabric softener that is free of dyes and perfumes, it acceptable to use  9. If your doctor has given you prescription creams you may apply moisturizers over them      Due to recent changes in healthcare laws, you may see results of your pathology and/or laboratory studies on MyChart before the doctors have had a chance to review them. We understand that in some cases there may be results that are confusing or concerning to you. Please understand that not all results are received at the same time and often the doctors may need to interpret multiple results in order to provide you with the best plan of care or course of treatment. Therefore, we ask that you please give [place] 2 business days to thoroughly review all your results before contacting the office for clarification. Should we see a critical lab result, you will be contacted sooner.   If You Need Anything After Your Visit  If you have any questions or concerns for your doctor, please call our main line at [phone] and press option 4 to reach your doctor's medical assistant. If no one answers, please leave a voicemail as directed and we will return your call as soon as possible. Messages left after 4 pm will be answered the following business day.   You may also send [place] a message via MyChart. We typically respond to MyChart messages within 1-2 business days.  For prescription refills, please ask your pharmacy to contact our office. Our fax number is [phone].  If you have an urgent issue when the clinic is closed that cannot wait until the next business day, you can page your doctor at the number below.    Please note that while we do our best to be available for urgent issues outside of office hours, we are not available 24/7.   If you have an urgent issue and are unable to reach [place], you may choose to seek medical care at your doctor's office, retail clinic, urgent care center, or emergency room.  If you have a medical emergency,  please immediately call 911 or go to the emergency department.  Pager Numbers  - Dr. Gwen Pounds: [phone]  - Dr. Roseanne Reno: [phone]  - Dr. Katrinka Blazing: [phone]   In the event of inclement weather, please call our main line at [phone] for an update on the status of any delays or closures.  Dermatology Medication Tips: Please keep the boxes that topical medications come in in order to help keep track of the instructions about where and how to use these. Pharmacies typically print the medication instructions only on the boxes and not directly on the medication tubes.   If your medication is too expensive, please contact our office at [phone] option 4 or send [place] a message through MyChart.   We are unable to tell what your co-pay for medications will be in advance as this is different depending on your insurance coverage. However, we may be able to find a substitute medication at lower cost or fill out paperwork to get insurance to cover a needed medication.   If a prior authorization is required to get your medication covered by your insurance company, please allow [place] 1-2 business days to complete this process.  Drug prices often vary depending on where the prescription is filled and some pharmacies may offer cheaper prices.  The website www.goodrx.com contains coupons for medications through different pharmacies. The prices here do not account for what the cost may be with help from insurance (it may be cheaper with your insurance), but the website can give you the price if you did not use any insurance.  - You can print the associated coupon and take it with your prescription to the pharmacy.  - You may also stop by our office during regular business hours and pick up a GoodRx coupon card.  - If you need your prescription sent electronically to a different pharmacy, notify our office through [facility] or by phone at [phone] option 4.     Si Usted Necesita  Algo Despus de Su Visita  Tambin puede enviarnos un mensaje a travs de Clinical cytogeneticist. Por lo general respondemos a los mensajes de MyChart en el transcurso de 1 a 2 das hbiles.  Para renovar recetas, por favor pida a su farmacia que se ponga en contacto con nuestra oficina. Annie Sable de fax es [place] [phone].  Si tiene un asunto urgente cuando la clnica est cerrada y que no puede esperar hasta el siguiente da hbil, puede llamar/localizar a su doctor(a) al nmero que aparece a continuacin.   Por favor, tenga en cuenta que aunque hacemos todo lo posible para estar disponibles para asuntos urgentes fuera del horario de [place], no estamos disponibles las 24 horas del da, los 7 [address]  [address] de la [place].   Si tiene un problema urgente y no puede comunicarse con nosotros, puede optar por buscar atencin mdica  en el consultorio de su doctor(a), en una clnica privada, en un centro de atencin urgente o en una sala de emergencias.  Si tiene Engineer, drilling, por favor llame inmediatamente al 911 o vaya a la sala de emergencias.  Nmeros de bper  - Dr. Gwen Pounds: [phone]  - Dra. Roseanne Reno: [phone]  - Dr. Katrinka Blazing: [phone]   En caso de inclemencias del tiempo, por favor llame a Lacy Duverney principal al [phone] para una actualizacin sobre el [place] de cualquier retraso o cierre.  Consejos para la medicacin en dermatologa: Por favor, guarde las cajas en las que vienen los medicamentos de uso tpico para ayudarle a seguir las instrucciones sobre dnde y cmo usarlos. Las farmacias generalmente imprimen las instrucciones del medicamento slo en las cajas y no directamente en los tubos del [place].   Si su medicamento es muy caro, por favor, pngase en contacto con Rolm Gala llamando al [phone] y presione la opcin 4 o envenos un mensaje a travs de Clinical cytogeneticist.   No podemos decirle cul ser su copago por los medicamentos por adelantado ya que esto es  diferente dependiendo de la cobertura de su seguro. Sin embargo, es posible que podamos encontrar un medicamento sustituto a Audiological scientist un formulario para que el seguro cubra el medicamento que se considera necesario.   Si se requiere una autorizacin previa para que su compaa de seguros [place] su medicamento, por favor permtanos de 1 a 2 das hbiles para completar [address]  Los precios de los medicamentos varan con frecuencia dependiendo del Environmental consultant de dnde se surte la receta y alguna farmacias pueden ofrecer precios ms baratos.  El sitio web www.goodrx.com tiene cupones para medicamentos de Health and safety inspector. Los precios aqu no tienen en cuenta lo que podra costar con la ayuda del seguro (puede ser ms barato con su seguro), pero el sitio web puede darle el precio si no utiliz Tourist information centre manager.  - Puede imprimir el cupn correspondiente y llevarlo con su receta a la farmacia.  - Tambin puede pasar por nuestra oficina durante el horario de atencin regular y Education officer, museum una tarjeta de cupones de GoodRx.  - Si necesita que su receta se enve electrnicamente a una farmacia diferente, informe a nuestra oficina a travs de MyChart de [place] o por telfono llamando al [phone] y presione la opcin 4.

## 2023-11-05 ENCOUNTER — Inpatient Hospital Stay: Payer: Medicare HMO | Attending: Obstetrics and Gynecology | Admitting: Obstetrics and Gynecology

## 2023-11-05 ENCOUNTER — Encounter: Payer: Self-pay | Admitting: Obstetrics and Gynecology

## 2023-11-05 VITALS — BP 116/81 | HR 76 | Temp 97.9°F | Resp 20 | Wt 134.4 lb

## 2023-11-05 DIAGNOSIS — Z9079 Acquired absence of other genital organ(s): Secondary | ICD-10-CM | POA: Insufficient documentation

## 2023-11-05 DIAGNOSIS — L910 Hypertrophic scar: Secondary | ICD-10-CM | POA: Insufficient documentation

## 2023-11-05 DIAGNOSIS — C519 Malignant neoplasm of vulva, unspecified: Secondary | ICD-10-CM | POA: Diagnosis not present

## 2023-11-05 NOTE — Progress Notes (Signed)
Gynecologic Oncology Interval Visit   Referring Provider: Althea Grimmer, PA   Chief Concern: SCC of vulva  Subjective:  Elizabeth Mata is a 62 y.o. female who is seen in consultation from [place], [place] [facility]) for newly diagnosed squamous cell carcinoma of the vulva, s/p WLE on 01/01/23.  At her last visit we asked Pathology to review her vulvar biopsy. Dr. Oneita Kras from pathology has reviewed 12/03/2022 biopsy and confirmed pathology consistent with malignancy.   Elizabeth Mata reviewed recommendations with dermatology and they recommended a 3 month treatment of twice a day [place] for [place]. The patient is also interested in biopsying the area given her symptoms and concern for recurrence.   Gynecologic Oncology History:  62 y.o. G2P2 whose LMP was No LMP recorded. Patient has had a hysterectomy., presents today for enlarging labial lesion. She had noticed what she thought was ingrown hair for several months which then became painful and enlarging. She attempted home I&D then sought evaluation with gynecology.     12/03/2022 biopsy performed - Specimen was 0.3 x 0.3 x 0.2 cm fragment  PATHOLOGY: A. LABIA, RIGHT LESION, BIOPSY: - Moderately differentiated squamous cell carcinoma - Carcinoma is present at biopsy edge  11/10/2022 CT A/P IMPRESSION: No acute findings in the abdomen or pelvis.   HIV nonreactive 08/09/2019  No gross lesion identified on initial exam with Dr. Sonia Side.   01/01/23- EUA, colposcopy, and WLE of vulva. Lesion visualized during colposcopy. Defect was approximately 3 x 1.5 cm. Stitch placed at 12:00.   Pathology:  A. VULVA, RIGHT; BIOPSY:  - DERMAL FIBROSIS, CONSISTENT WITH PREVIOUS BIOPSY SITE.  - NO RESIDUAL SQUAMOUS CELL CARCINOMA IDENTIFIED (REF OUTSIDE PATHOLOGY  REPORT; [phone]).   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    01/2023 Postop visit. Incision noted to be well healed.  Recommended observation and review of pathology at [facility].   Problem List: Patient Active Problem List   Diagnosis Date Noted   Vulvar cancer (HCC) 12/18/2022   Mixed hyperlipidemia 09/16/2022   Pelvic floor weakness in female 03/03/2020   Polyp of ascending colon    Abdominal pain, chronic, epigastric    Carotid atherosclerosis, bilateral 11/06/2017   Hx of completed stroke 10/28/2017   Skin pigmentation disorder 07/24/2016   Allergic rhinitis 06/30/2016   Eczema 09/13/2015   Irritable bowel syndrome with constipation 09/13/2015   Seizures (HCC) 08/16/2015   [place]icker[place]'s migraine 07/06/2012    Past Medical History: Past Medical History:  Diagnosis Date   Allergic rhinitis 06/30/2016   Allergy    Carotid atherosclerosis, bilateral 11/06/2017   [place] Nov 2018   Cyst of breast, left, solitary    Flank pain    GERD (gastroesophageal reflux disease)    Hemorrhoid    Hemorrhoids    Hyperlipidemia    IFG (impaired fasting glucose) 07/18/2016   June 2017    Kidney stone on left side 10/24/2015   Kidney stones    Neuropathy 07/24/2016   Seizures (HCC)    Shingles outbreak 02/03/2017   Stomach irritation    Syncope    Recurrent over the last 2 years. had workup at [facility] this year with cardiology and neurology. Had echo and 3-week event monitor that were unremarkable. Has had negative EEG. Echo in 11/11 at [facility] showed no significant abnormalities. Possibly vasovagal   TIA (transient ischemic attack) 10/28/2017   Trigger thumb     Past Surgical History: Past Surgical History:  Procedure Laterality Date  ABDOMINAL HYSTERECTOMY  40s   endometriosis, fibroid tumors; no cancer, partial   BREAST EXCISIONAL BIOPSY Left 1980'S   EXCISIONAL - NEG   BREAST SURGERY     CHOLECYSTECTOMY  31   COLONOSCOPY WITH PROPOFOL N/A 06/23/2019   Procedure: COLONOSCOPY WITH PROPOFOL;  Surgeon: Pasty Spillers, MD;  Location: [facility] ENDOSCOPY;  Service: Endoscopy;  Laterality: N/A;    COLONOSCOPY WITH PROPOFOL N/A 12/28/2020   Procedure: COLONOSCOPY WITH PROPOFOL;  Surgeon: Pasty Spillers, MD;  Location: [facility] ENDOSCOPY;  Service: Endoscopy;  Laterality: N/A;   ESOPHAGOGASTRODUODENOSCOPY (EGD) WITH PROPOFOL N/A 06/23/2019   Procedure: ESOPHAGOGASTRODUODENOSCOPY (EGD) WITH PROPOFOL;  Surgeon: Pasty Spillers, MD;  Location: [facility] ENDOSCOPY;  Service: Endoscopy;  Laterality: N/A;   ESOPHAGOGASTRODUODENOSCOPY (EGD) WITH PROPOFOL N/A 12/28/2020   Procedure: ESOPHAGOGASTRODUODENOSCOPY (EGD) WITH PROPOFOL;  Surgeon: Pasty Spillers, MD;  Location: [facility] ENDOSCOPY;  Service: Endoscopy;  Laterality: N/A;   ESOPHAGOGASTRODUODENOSCOPY (EGD) WITH PROPOFOL N/A 12/27/2022   Procedure: ESOPHAGOGASTRODUODENOSCOPY (EGD) WITH PROPOFOL;  Surgeon: Wyline Mood, MD;  Location[facility] END[facility]COPY;  Service: Gastroenterology;  Laterality: N/A;   FOOT SURGERY Right    tendon release   VULVECTOMY PARTIAL N/A 01/01/2023   Procedure: EXAM UNDER ANESTHESIA,VULVECTOMY PARTIAL, COLPOSCOPY;  Surgeon: Artelia Laroche, MD;  Location: [facility] ORS;  Service: Gynecology;  Laterality: N/A;    Past Gynecologic History:  As per HPI  OB History:  OB History  Gravida Para Term Preterm AB Living  2 2       2   SAB IAB Ectopic Multiple Live Births               # Outcome Date GA Lbr Len/2nd Weight Sex Type Anes PTL Lv  2 Para           1 Para             Obstetric Comments  Menstrual age: 20    Age 1st Pregnancy: 62    Family History: Family History  Problem Relation Age of Onset   Coronary artery disease Maternal Uncle    Diabetes Mellitus II Mother    Arthritis Mother    Diabetes Maternal Uncle    Kidney cancer Father 77       died 05/06/17   Kidney disease Father    Cancer Father        kidney and bladder   Arthritis Maternal Uncle    Cancer Maternal Grandmother        stomach?   Jaundice Maternal Grandmother    Stomach cancer Paternal Grandmother    Cancer Paternal  Grandmother        stomach   Hypertension Sister    Stroke Maternal Grandfather    Diabetes Paternal Grandfather    Diabetes Sister    Hypertension Sister    Breast cancer Neg Hx    Bladder Cancer Neg Hx     Social History: Social History   Socioeconomic History   Marital status: Legally Separated    Spouse name: Not on file   Number of children: 2   Years of education: Not on file   Highest education level: GED or equivalent  Occupational History   Occupation: unemployed  Tobacco Use   Smoking status: Never    Passive exposure: Never   Smokeless tobacco: Never  Vaping Use   Vaping status: Never Used  Substance and Sexual Activity   Alcohol use: Yes    Comment: occasionally   Drug use: Never   Sexual activity:  Yes    Partners: Male    Birth control/protection: Surgical    Comment: Hysterectomy  Other Topics Concern   Not on file  Social History Narrative   Regular exercise   Social Determinants of Health   Financial Resource Strain: Low Risk  (06/06/2023)   Overall Financial Resource Strain (CARDIA)    Difficulty of Paying Living Expenses: Not hard at all  Food Insecurity: No Food Insecurity (06/06/2023)   Hunger Vital Sign    Worried About Running Out of Food in the Last Year: Never true    Ran Out of Food in the Last Year: Never true  Transportation Needs: No Transportation Needs (06/06/2023)   PRAPARE - Administrator, Civil Service (Medical): No    Lack of Transportation (Non-Medical): No  Physical Activity: Sufficiently Active (06/06/2023)   Exercise Vital Sign    Days of Exercise per Week: 7 days    Minutes of Exercise per Session: 30 min  Stress: No Stress Concern Present (06/06/2023)   [employer] of Occupational Health - Occupational Stress Questionnaire    Feeling of Stress : Not at all  Social Connections: Moderately Isolated (06/06/2023)   Social Connection and Isolation Panel [NHANES]    Frequency of Communication with Friends and  Family: More than three times a week    Frequency of Social Gatherings with Friends and Family: Three times a week    Attends Religious Services: More than 4 times per year    Active Member of Clubs or Organizations: No    Attends Banker Meetings: Never    Marital Status: Separated  Intimate Partner Violence: Not At Risk (06/06/2023)   Humiliation, Afraid, Rape, and Kick questionnaire    Fear of Current or Ex-Partner: No    Emotionally Abused: No    Physically Abused: No    Sexually Abused: No    Allergies: Allergies  Allergen Reactions   Clindamycin/Lincomycin Hives   Hydrocodone Nausea Only   Metronidazole Hives   Sulfa Antibiotics Hives    Break out in hives all over   Oxycodone Nausea And Vomiting    Current Medications: Current Outpatient Medications  Medication Sig Dispense Refill   aspirin EC 81 MG EC tablet Take 1 tablet (81 mg total) by mouth daily. 30 tablet 1   atorvastatin (LIPITOR) 80 MG tablet Take 1 tablet (80 mg total) by mouth daily. 90 tablet 3   ezetimibe (ZETIA) 10 MG tablet Take 1 tablet (10 mg total) by mouth daily. 90 tablet 3   fexofenadine (ALLEGRA) 180 MG tablet Take 180 mg by mouth daily.     Fluocinolone Acetonide 0.01 % OIL Apply topically 1 - 2 drops qd/bid prn for itch inside ears 20 mL 11   mometasone (ELOCON) 0.1 % cream Apply topically to flares at body qd/bid prn for up to 2 weeks. Avoid applying to face, groin, and axilla. Use as directed. 45 g 3   omeprazole (PRILOSEC) 40 MG capsule Take 1 capsule (40 mg total) by mouth daily as needed (epigastric pain, reflux, indigestion). 30 capsule 3   Rimegepant Sulfate (NURTEC) 75 MG TBDP Take by mouth.     tacrolimus (PROTOPIC) 0.1 % ointment Apply topically as directed. qd to bid aa itchy rash on face and body 100 g 1   topiramate (TOPAMAX) 200 MG tablet Take 200 mg by mouth 2 (two) times daily.   11   hydroquinone 4 % cream Apply twice daily to dark spots on face. Use no  more than 3  months at a time. (Patient not taking: Reported on 08/06/2023) 28.35 g 3   No current facility-administered medications for this visit.    Review of Systems General: no complaints  HEENT: no complaints  Lungs: cough o/w no complaints  Cardiac: no complaints  GI: no complaints  GU: vulvar irritation and two lump at the incisional scar o/w no complaints  Musculoskeletal: back pain o/w no complaints  Extremities: no complaints  Skin: yes as noted in GU  Neuro: no complaints  Endocrine: no complaints  Psych: no complaints        Objective:  Physical Examination:  BP 116/81   Pulse 76   Temp 97.9 F (36.6 C)   Resp 20   Wt 134 lb 6.4 oz (61 kg)   SpO2 100%   BMI 27.15 kg/m    ECOG Performance Status: 0 - Asymptomatic   Pelvic: deferred from 08/06/2023 EGBUS: healed incision on the right vulva. 2-3 cm in length with what appears to be raised keloidal area at the superior aspect of the incision - overall stable. No gross cancerous appearing lesions.  Remainder of exam deferred: Cervix: surgically absent Vagina: no lesions, no discharge or bleeding Uterus: surgically absent BME: On palpation there are several paravaginal cystic lesions ~ 8 mm in size.   Vulvar Biopsy Procedure The risks and benefits of the procedure were reviewed and informed consent obtained. Time out was performed. The patient received pre-procedure teaching and expressed understanding. The post-procedure instructions were reviewed with the patient and she expressed understanding. The patient does not have any barriers to learning. Colposcopy was performed of the vulvar after application of acetic acid. A small area of AWE was seen below the second inferior raised area.    Lidocaine gel was applied for 5 minutes before the biopsy. The areas were numbed injected with 3cc 2% lidocaine. A biopsy forceps was used to obtained the three biopsies - superior, middile and inferior (inferior included the area of AWE).   Hemostasis was obtained with silver nitrate. She tolerated the procedure well.    Lab Review Per hpi  Radiologic Imaging: Per HPI     Assessment:  Elizabeth Mata is a 62 y.o. 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 01/01/23- EUA, colposcopy, and WLE of vulva. No residual dysplasia or cancer in the pathology specimen. 07/2023 concern for keloid formation  H/o hypercalcemia  Medical co-morbidities complicating care: h/o Carotid atherosclerosis, bilateral with TIA 2018; Hyperlipidemia and h/o seizures on therapy  Plan:   Problem List Items Addressed This Visit   None  We discussed that biopsies will be followed up and determine if further intervention needed. Keloid treatment was recommended and Elizabeth Masse, NP reviewed applications.   Dermatology appointment is scheduled for Oct 2024.   Return to clinic in 2 months to review dermatology recommendations and how treatment for keloids is progressing.   The patient's diagnosis, an outline of the further diagnostic and laboratory studies which will be required, the recommendation, and alternatives were discussed.  All questions were answered to the patient's satisfaction.  I personally interviewed and examined the patient. Agreed with the above/below plan of care. I have directly contributed to assessment and plan of care of this patient and educated and discussed with patient and family.   Alinda Dooms, NP    CC:  Althea Grimmer, PA

## 2023-11-05 NOTE — Progress Notes (Signed)
Gynecologic Oncology Interval Visit   Referring Provider: Althea Grimmer, PA   Chief Concern: SCC of vulva  Subjective:  Elizabeth Mata is a 62 y.o. female who is seen in consultation from [place], [facility]) for newly diagnosed squamous cell carcinoma of the vulva, s/p WLE on 01/01/23.  She presents today for a vulvar evaluation after starting the Serica vulvar topical treatment for keloids. She does feel that her symptoms have improved and the scar is not as prominent or firm.   Gynecologic Oncology History:  62 y.o. G2P2 whose LMP was No LMP recorded. Patient has had a hysterectomy., presents today for enlarging labial lesion. She had noticed what she thought was ingrown hair for several months which then became painful and enlarging. She attempted home I&D then sought evaluation with gynecology.     12/03/2022 biopsy performed - Specimen was 0.3 x 0.3 x 0.2 cm fragment  PATHOLOGY: A. LABIA, RIGHT LESION, BIOPSY: - Moderately differentiated squamous cell carcinoma - Carcinoma is present at biopsy edge  11/10/2022 CT A/P IMPRESSION: No acute findings in the abdomen or pelvis.   HIV nonreactive 08/09/2019  No gross lesion identified on initial exam with Dr. Sonia Side.   01/01/23- EUA, colposcopy, and WLE of vulva. Lesion visualized during colposcopy. Defect was approximately 3 x 1.5 cm. Stitch placed at 12:00.   Pathology:  A. VULVA, RIGHT; BIOPSY:  - DERMAL FIBROSIS, CONSISTENT WITH PREVIOUS BIOPSY SITE.  - NO RESIDUAL SQUAMOUS CELL CARCINOMA IDENTIFIED (REF OUTSIDE PATHOLOGY  REPORT; [phone]).   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    01/2023 Postop visit. Incision noted to be well healed. Recommended observation and review of pathology at [facility].   07/2023 At her request we asked Pathology to review her vulvar biopsy. Dr. Oneita Kras from pathology has reviewed 12/03/2022 biopsy and confirmed pathology  consistent with malignancy.   Consuello Masse reviewed recommendations with dermatology and they recommended a 3 month treatment of twice a day [place] for [place].   09/03/2023 Vulvar biopsies 1. Vulva, biopsy, Right lower labia: - SQUAMOUS MUCOSA WITH FIBROSIS, MINIMAL CHRONIC INFLAMMATION AND HYPERGRANULOSIS. NO DYSPLASIA OR MALIGNANCY.  2. Vulva, biopsy, Right middle labia - SQUAMOUS MUCOSA WITH FIBROSIS, MINIMAL CHRONIC INFLAMMATION AND HYPERGRANULOSIS. NO DYSPLASIA OR MALIGNANCY. - SEE COMMENT.  3. Vulva, biopsy, Right upper labia - SQUAMOUS MUCOSA WITH FIBROSIS, MINIMAL CHRONIC INFLAMMATION AND HYPERGRANULOSIS. NO DYSPLASIA OR MALIGNANCY.  Microscopic Comment 1. -3. COMMENT: The findings are non specific and appear reactive.    Problem List: Patient Active Problem List   Diagnosis Date Noted   Vulvar cancer (HCC) 12/18/2022   Mixed hyperlipidemia 09/16/2022   Pelvic floor weakness in female 03/03/2020   Polyp of ascending colon    Abdominal pain, chronic, epigastric    Carotid atherosclerosis, bilateral 11/06/2017   Hx of completed stroke 10/28/2017   Skin pigmentation disorder 07/24/2016   Allergic rhinitis 06/30/2016   Eczema 09/13/2015   Irritable bowel syndrome with constipation 09/13/2015   Seizures (HCC) 08/16/2015   Bickerstaff's migraine 07/06/2012    Past Medical History: Past Medical History:  Diagnosis Date   Allergic rhinitis 06/30/2016   Allergy    Carotid atherosclerosis, bilateral 11/06/2017   [place] Nov 2018   Cyst of breast, left, solitary    Flank pain    GERD (gastroesophageal reflux disease)    Hemorrhoid    Hemorrhoids    Hyperlipidemia    IFG (impaired fasting glucose) 07/18/2016   June 2017  Kidney stone on left side 10/24/2015   Kidney stones    Neuropathy 07/24/2016   Seizures (HCC)    Shingles outbreak 02/03/2017   Stomach irritation    Syncope    Recurrent over the last 2 years. had workup at [facility] this year with cardiology and neurology.  Had echo and 3-week event monitor that were unremarkable. Has had negative EEG. Echo in 11/11 at [facility] showed no significant abnormalities. Possibly vasovagal   TIA (transient ischemic attack) 10/28/2017   Trigger thumb     Past Surgical History: Past Surgical History:  Procedure Laterality Date   ABDOMINAL HYSTERECTOMY  40s   endometriosis, fibroid tumors; no cancer, partial   BREAST EXCISIONAL BIOPSY Left 1980'S   EXCISIONAL - NEG   BREAST SURGERY     CHOLECYSTECTOMY  31   COLONOSCOPY WITH PROPOFOL N/A 06/23/2019   Procedure: COLONOSCOPY WITH PROPOFOL;  Surgeon: Pasty Spillers, MD;  Location: [facility] ENDOSCOPY;  Service: Endoscopy;  Laterality: N/A;   COLONOSCOPY WITH PROPOFOL N/A 12/28/2020   Procedure: COLONOSCOPY WITH PROPOFOL;  Surgeon: Pasty Spillers, MD;  Location: [facility] ENDOSCOPY;  Service: Endoscopy;  Laterality: N/A;   ESOPHAGOGASTRODUODENOSCOPY (EGD) WITH PROPOFOL N/A 06/23/2019   Procedure: ESOPHAGOGASTRODUODENOSCOPY (EGD) WITH PROPOFOL;  Surgeon: Pasty Spillers, MD;  Location: [facility] ENDOSCOPY;  Service: Endoscopy;  Laterality: N/A;   ESOPHAGOGASTRODUODENOSCOPY (EGD) WITH PROPOFOL N/A 12/28/2020   Procedure: ESOPHAGOGASTRODUODENOSCOPY (EGD) WITH PROPOFOL;  Surgeon: Pasty Spillers, MD;  Location: [facility] ENDOSCOPY;  Service: Endoscopy;  Laterality: N/A;   ESOPHAGOGASTRODUODENOSCOPY (EGD) WITH PROPOFOL N/A 12/27/2022   Procedure: ESOPHAGOGASTRODUODENOSCOPY (EGD) WITH PROPOFOL;  Surgeon: Wyline Mood, MD;  Location: [facility] ENDOSCOPY;  Service: Gastroenterology;  Laterality: N/A;   FOOT SURGERY Right    tendon release   VULVECTOMY PARTIAL N/A 01/01/2023   Procedure: EXAM UNDER ANESTHESIA,VULVECTOMY PARTIAL, COLPOSCOPY;  Surgeon: Artelia Laroche, MD;  Location: [facility] ORS;  Service: Gynecology;  Laterality: N/A;    Past Gynecologic History:  As per HPI  OB History:  OB History  Gravida Para Term Preterm AB Living  2 2       2   SAB IAB Ectopic Multiple Live  Births               # Outcome Date GA Lbr Len/2nd Weight Sex Type Anes PTL Lv  2 Para           1 Para             Obstetric Comments  Menstrual age: 17    Age 1st Pregnancy: 66    Family History: Family History  Problem Relation Age of Onset   Coronary artery disease Maternal Uncle    Diabetes Mellitus II Mother    Arthritis Mother    Diabetes Maternal Uncle    Kidney cancer Father 58       died 05-04-2017   Kidney disease Father    Cancer Father        kidney and bladder   Arthritis Maternal Uncle    Cancer Maternal Grandmother        stomach?   Jaundice Maternal Grandmother    Stomach cancer Paternal Grandmother    Cancer Paternal Grandmother        stomach   Hypertension Sister    Stroke Maternal Grandfather    Diabetes Paternal Grandfather    Diabetes Sister    Hypertension Sister    Breast cancer Neg Hx    Bladder Cancer Neg Hx  Social History: Social History   Socioeconomic History   Marital status: Legally Separated    Spouse name: Not on file   Number of children: 2   Years of education: Not on file   Highest education level: GED or equivalent  Occupational History   Occupation: unemployed  Tobacco Use   Smoking status: Never    Passive exposure: Never   Smokeless tobacco: Never  Vaping Use   Vaping status: Never Used  Substance and Sexual Activity   Alcohol use: Yes    Comment: occasionally   Drug use: Never   Sexual activity: Yes    Partners: Male    Birth control/protection: Surgical    Comment: Hysterectomy  Other Topics Concern   Not on file  Social History Narrative   Regular exercise   Social Determinants of Health   Financial Resource Strain: Low Risk  (06/06/2023)   Overall Financial Resource Strain (CARDIA)    Difficulty of Paying Living Expenses: Not hard at all  Food Insecurity: No Food Insecurity (06/06/2023)   Hunger Vital Sign    Worried About Running Out of Food in the Last Year: Never true    Ran Out of Food  in the Last Year: Never true  Transportation Needs: No Transportation Needs (06/06/2023)   PRAPARE - Administrator, Civil Service (Medical): No    Lack of Transportation (Non-Medical): No  Physical Activity: Sufficiently Active (06/06/2023)   Exercise Vital Sign    Days of Exercise per Week: 7 days    Minutes of Exercise per Session: 30 min  Stress: No Stress Concern Present (06/06/2023)   [employer] of Occupational Health - Occupational Stress Questionnaire    Feeling of Stress : Not at all  Social Connections: Moderately Isolated (06/06/2023)   Social Connection and Isolation Panel [NHANES]    Frequency of Communication with Friends and Family: More than three times a week    Frequency of Social Gatherings with Friends and Family: Three times a week    Attends Religious Services: More than 4 times per year    Active Member of Clubs or Organizations: No    Attends Banker Meetings: Never    Marital Status: Separated  Intimate Partner Violence: Not At Risk (06/06/2023)   Humiliation, Afraid, Rape, and Kick questionnaire    Fear of Current or Ex-Partner: No    Emotionally Abused: No    Physically Abused: No    Sexually Abused: No    Allergies: Allergies  Allergen Reactions   Clindamycin/Lincomycin Hives   Hydrocodone Nausea Only   Metronidazole Hives   Sulfa Antibiotics Hives    Break out in hives all over   Oxycodone Nausea And Vomiting    Current Medications: Current Outpatient Medications  Medication Sig Dispense Refill   aspirin EC 81 MG EC tablet Take 1 tablet (81 mg total) by mouth daily. 30 tablet 1   atorvastatin (LIPITOR) 80 MG tablet Take 1 tablet (80 mg total) by mouth daily. 90 tablet 3   ezetimibe (ZETIA) 10 MG tablet Take 1 tablet (10 mg total) by mouth daily. 90 tablet 3   fexofenadine (ALLEGRA) 180 MG tablet Take 180 mg by mouth daily.     Fluocinolone Acetonide 0.01 % OIL Apply topically 1 - 2 drops qd/bid prn for itch inside ears 20  mL 11   mometasone (ELOCON) 0.1 % cream Apply topically to flares at body qd/bid prn for up to 2 weeks. Avoid applying to face, groin, and  axilla. Use as directed. 45 g 3   omeprazole (PRILOSEC) 40 MG capsule Take 1 capsule (40 mg total) by mouth daily as needed (epigastric pain, reflux, indigestion). 30 capsule 3   Rimegepant Sulfate (NURTEC) 75 MG TBDP Take by mouth.     tacrolimus (PROTOPIC) 0.1 % ointment Apply topically as directed. qd to bid aa itchy rash on face and body 100 g 1   topiramate (TOPAMAX) 200 MG tablet Take 200 mg by mouth 2 (two) times daily.   11   hydroquinone 4 % cream Apply twice daily to dark spots on face. Use no more than 3 months at a time. (Patient not taking: Reported on 08/06/2023) 28.35 g 3   No current facility-administered medications for this visit.    Review of Systems General: no complaints  HEENT: no complaints  Lungs: cough o/w no complaints, chronic complaint  Cardiac: no complaints  GI: no complaints  GU: vulvar irritation improved  Musculoskeletal: no complaints  Extremities: no complaints  Skin: yes as noted in GU  Neuro: no complaints  Endocrine: no complaints  Psych: no complaints        Objective:  Physical Examination:  BP 116/81   Pulse 76   Temp 97.9 F (36.6 C)   Resp 20   Wt 134 lb 6.4 oz (61 kg)   SpO2 100%   BMI 27.15 kg/m    GENERAL: Patient is a well appearing female in no acute distress HEENT:  Atraumatic normocephalic ABDOMEN:  Soft, nontender, nondistended. No masses or ascites.   EXTREMITIES:  No peripheral edema.   NEURO:  Nonfocal. Well oriented.  Appropriate affect.  Pelvic: chaperoned by CMA EGBUS: healed incision on the right vulva. 2-3 cm in length the keloid is not as prominent. 1 cm firmness which is improved.  Vagina: there are two visible paravaginal cystic lesions ~ 8 mm in size.  Uterus: surgically absent.  BME: palpable multiple paravaginal cystic lesions ~ 8 mm in size along the right vaginal  sidewall and vaginal apex   Lab Review Per hpi  Radiologic Imaging: Per HPI     Assessment:  Elizabeth Mata is a 62 y.o. 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 01/01/23- EUA, colposcopy, and WLE of vulva. No residual dysplasia or cancer in the pathology specimen. 07/2023 concern for keloid formation, biopsies all negative. Currently on topical Serica.   H/o hypercalcemia  Medical co-morbidities complicating care: h/o Carotid atherosclerosis, bilateral with TIA 2018; Hyperlipidemia and h/o seizures on therapy Plan:   Problem List Items Addressed This Visit       Genitourinary   Vulvar cancer (HCC) - Primary   Other Visit Diagnoses     Keloid            She will continue the [place]. We hope she will follow up with dermatology. She seems please with the keloid response to therapy and feels that the biopsies also helped remove the lesion.   Continue surveillance will full surveillance visit for vulvar cancer at her next visit.   The patient's diagnosis, an outline of the further diagnostic and laboratory studies which will be required, the recommendation, and alternatives were discussed.  All questions were answered to the patient's satisfaction.  I personally interviewed and examined the patient. Agreed with the above/below plan of care. I have directly contributed to assessment and plan of care of this patient and educated and discussed with patient and family.   Bianca Vester Stephen  Sonia Side, MD

## 2023-11-06 ENCOUNTER — Encounter: Payer: Self-pay | Admitting: Gastroenterology

## 2023-11-06 ENCOUNTER — Ambulatory Visit: Payer: Medicare HMO | Admitting: Gastroenterology

## 2023-11-06 VITALS — BP 111/75 | HR 71 | Temp 97.9°F | Wt 131.6 lb

## 2023-11-06 DIAGNOSIS — R1013 Epigastric pain: Secondary | ICD-10-CM | POA: Diagnosis not present

## 2023-11-06 NOTE — Progress Notes (Signed)
Elizabeth Mood MD, MRCP(U.K) [address]  [address]  [place], [place] [address]  Main: [phone]  Fax: [phone]   Primary Care Physician: Pcp, No  Primary Gastroenterologist:  Dr. Wyline Mata   No chief complaint on file.   HPI: Elizabeth Mata is a 62 y.o. female   Summary of history :  Previously seen by Dr Maximino Greenland last in 07/2021 for heartburn,loose stools and weight gain.  Transferred care to me on 08/26/2022 . At that time stated she developed epigastric pain ongoing at least for a year if not longer.  Dull in nature usually worse after she eats.  Not relieved with a bowel movement can last hours.  Nonradiating.  Denies any constipation to having diarrhea for few years.  Has a bowel movement right after she eats.  Has been taking meloxicam for years.  Stopped taking it a few days prior .  Not on any PPI.  Denies any other NSAIDs.  Denies any weight loss.She was also complaining of chronic diarrhea likely bile salt mediated/.    11/2020: EGD: Reactive gastropathy . Colonoscopy no evidence of microscopic colitis.    08/26/2022:H pylori breath test negative.  08/2022: Stool negative for c diff but GI PCR not performed by lab for unknown reason.  Back in 2023 the diarrhea resolved.  She has stopped taking meloxicam   Interval history 10/28/2022-11/06/2023  12/18/2022: CT abdomen pelvis shows no acute findings 12/27/2022: EGD: erosive gastritis  No recent labs she returns with symptoms of bloating and occasional regurgitation no abdominal pain.  She admits she has restarted meloxicam which she has been told in the past to stop.    Current Outpatient Medications  Medication Sig Dispense Refill   aspirin EC 81 MG EC tablet Take 1 tablet (81 mg total) by mouth daily. 30 tablet 1   atorvastatin (LIPITOR) 80 MG tablet Take 1 tablet (80 mg total) by mouth daily. 90 tablet 3   ezetimibe (ZETIA) 10 MG tablet Take 1 tablet (10 mg total) by mouth daily. 90 tablet 3    fexofenadine (ALLEGRA) 180 MG tablet Take 180 mg by mouth daily.     Fluocinolone Acetonide 0.01 % OIL Apply topically 1 - 2 drops qd/bid prn for itch inside ears 20 mL 11   hydroquinone 4 % cream Apply twice daily to dark spots on face. Use no more than 3 months at a time. 28.35 g 3   mometasone (ELOCON) 0.1 % cream Apply topically to flares at body qd/bid prn for up to 2 weeks. Avoid applying to face, groin, and axilla. Use as directed. 45 g 3   omeprazole (PRILOSEC) 40 MG capsule Take 1 capsule (40 mg total) by mouth daily as needed (epigastric pain, reflux, indigestion). 30 capsule 3   Rimegepant Sulfate (NURTEC) 75 MG TBDP Take by mouth.     tacrolimus (PROTOPIC) 0.1 % ointment Apply topically as directed. qd to bid aa itchy rash on face and body 100 g 1   topiramate (TOPAMAX) 200 MG tablet Take 200 mg by mouth 2 (two) times daily.   11   No current facility-administered medications for this visit.    Allergies as of 11/06/2023 - Review Complete 11/06/2023  Allergen Reaction Noted   Clindamycin/lincomycin Hives 11/10/2015   Hydrocodone Nausea Only 03/01/2015   Metronidazole Hives 11/07/2010   Sulfa antibiotics Hives 07/05/2014   Oxycodone Nausea And Vomiting 02/05/2017      ROS:  General: Negative for anorexia, weight loss, fever, chills,  fatigue, weakness. ENT: Negative for hoarseness, difficulty swallowing , nasal congestion. CV: Negative for chest pain, angina, palpitations, dyspnea on exertion, peripheral edema.  Respiratory: Negative for dyspnea at rest, dyspnea on exertion, cough, sputum, wheezing.  GI: See history of present illness. GU:  Negative for dysuria, hematuria, urinary incontinence, urinary frequency, nocturnal urination.  Endo: Negative for unusual weight change.    Physical Examination:   BP 111/75   Pulse 71   Temp 97.9 F (36.6 C) (Oral)   Wt 131 lb 9.6 oz (59.7 kg)   BMI 26.58 kg/m   General: Well-nourished, well-developed in no acute distress.   Eyes: No icterus. Conjunctivae pink. Mouth: Oropharyngeal mucosa moist and pink , no lesions erythema or exudate. Lungs: Clear to auscultation bilaterally. Non-labored. Neuro: Alert and oriented x 3.  Grossly intact. Skin: Warm and dry, no jaundice.   Psych: Alert and cooperative, normal Mata and affect.   Imaging Studies: No results found.  Assessment and Plan:   Elizabeth Mata is a 62 y.o. y/o female here to follow up.  History of NSAID related injury due to meloxicam causing reactive gastropathy she had previously stopped but returns to the office with dyspeptic symptoms bloating regurgitation likely due to the commencement of meloxicam.  Discussed with her that she should stop it.  Will commence on a low FODMAP diet provide FDgard for dyspeptic symptoms   Follow-up as needed      Dr Elizabeth Mood  MD,MRCP [facility])

## 2023-11-18 ENCOUNTER — Telehealth: Payer: Self-pay

## 2023-11-18 NOTE — Telephone Encounter (Signed)
Patient left a voicemail wanting to know if we accept [facility]. Return her call and informed her yes we do accept [facility] insurance she said thank you

## 2024-01-05 ENCOUNTER — Telehealth: Payer: Self-pay

## 2024-01-05 NOTE — Telephone Encounter (Signed)
 I attempted to call patient. According to Epic patient doesn't have a pcp. I left a message letting patient know I cancelled AWV appointment.

## 2024-01-05 NOTE — Telephone Encounter (Signed)
 Copied from CRM [id number]. Topic: Appointments - Appointment Scheduling >> Jan 05, 2024 10:14 AM Yvone Marda Blow wrote: Patient was returning the call, that said she needs to reschedule her upcoming appointment. Patient is questioning what appointment. Please advise and notate.

## 2024-01-26 ENCOUNTER — Ambulatory Visit: Payer: Medicare HMO | Admitting: Dermatology

## 2024-02-10 ENCOUNTER — Telehealth: Payer: Self-pay | Admitting: *Deleted

## 2024-02-10 NOTE — Telephone Encounter (Signed)
Called pt and told her 2/12 at 2:30. She is ok .

## 2024-02-11 ENCOUNTER — Ambulatory Visit: Payer: Medicare HMO

## 2024-02-11 ENCOUNTER — Inpatient Hospital Stay: Payer: Medicare HMO | Attending: Obstetrics and Gynecology | Admitting: Obstetrics and Gynecology

## 2024-02-11 VITALS — BP 116/81 | HR 72 | Temp 96.6°F | Resp 20 | Wt 132.2 lb

## 2024-02-11 DIAGNOSIS — Z8544 Personal history of malignant neoplasm of other female genital organs: Secondary | ICD-10-CM | POA: Insufficient documentation

## 2024-02-11 DIAGNOSIS — L910 Hypertrophic scar: Secondary | ICD-10-CM | POA: Diagnosis not present

## 2024-02-11 DIAGNOSIS — Z9079 Acquired absence of other genital organ(s): Secondary | ICD-10-CM | POA: Insufficient documentation

## 2024-02-11 DIAGNOSIS — C519 Malignant neoplasm of vulva, unspecified: Secondary | ICD-10-CM

## 2024-02-11 NOTE — Progress Notes (Signed)
Gynecologic Oncology Interval Visit   Referring Provider: Althea Grimmer, PA   Chief Concern: SCC of vulva  Subjective:  Elizabeth Mata is a 63 y.o. female who is seen in consultation from [place], [facility]) for newly diagnosed squamous cell carcinoma of the vulva, s/p WLE on 01/01/23.  She presents today for follow up vulvar cancer surveillance.  Uses Serica vulvar topical treatment for keloid. She does feel that her symptoms have improved and the scar is not as prominent or firm.  She is occasionally sexually active without pain.   Gynecologic Oncology History:  63 y.o. G2P2 whose LMP was No LMP recorded. Patient has had a hysterectomy., presented for enlarging labial lesion. She had noticed what she thought was ingrown hair for several months which then became painful and enlarging. She attempted home I&D then sought evaluation with gynecology.     12/03/2022 biopsy performed - Specimen was 0.3 x 0.3 x 0.2 cm fragment  PATHOLOGY: A. LABIA, RIGHT LESION, BIOPSY: - Moderately differentiated squamous cell carcinoma - Carcinoma is present at biopsy edge  11/10/2022 CT A/P IMPRESSION: No acute findings in the abdomen or pelvis.   HIV nonreactive 08/09/2019  No gross lesion identified on initial exam with Dr. Sonia Side.   01/01/23- EUA, colposcopy, and WLE of vulva. Lesion visualized during colposcopy. Defect was approximately 3 x 1.5 cm. Stitch placed at 12:00.   Pathology:  A. VULVA, RIGHT; BIOPSY:  - DERMAL FIBROSIS, CONSISTENT WITH PREVIOUS BIOPSY SITE.  - NO RESIDUAL SQUAMOUS CELL CARCINOMA IDENTIFIED (REF OUTSIDE PATHOLOGY  REPORT; [phone]).   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    01/2023 Postop visit. Incision noted to be well healed. Recommended observation and review of pathology at Tumor Board.   07/2023 At her request we asked Pathology to review her vulvar biopsy. Dr. Oneita Kras from pathology has  reviewed 12/03/2022 biopsy and confirmed pathology consistent with malignancy.   Consuello Masse reviewed recommendations with dermatology and they recommended a 3 month treatment of twice a day [place] for [place].   09/03/2023 Vulvar biopsies 1. Vulva, biopsy, Right lower labia: - SQUAMOUS MUCOSA WITH FIBROSIS, MINIMAL CHRONIC INFLAMMATION AND HYPERGRANULOSIS. NO DYSPLASIA OR MALIGNANCY.  2. Vulva, biopsy, Right middle labia - SQUAMOUS MUCOSA WITH FIBROSIS, MINIMAL CHRONIC INFLAMMATION AND HYPERGRANULOSIS. NO DYSPLASIA OR MALIGNANCY. - SEE COMMENT.  3. Vulva, biopsy, Right upper labia - SQUAMOUS MUCOSA WITH FIBROSIS, MINIMAL CHRONIC INFLAMMATION AND HYPERGRANULOSIS. NO DYSPLASIA OR MALIGNANCY.  Microscopic Comment 1. -3. COMMENT: The findings are non specific and appear reactive.    Problem List: Patient Active Problem List   Diagnosis Date Noted   Vulvar cancer (HCC) 12/18/2022   Mixed hyperlipidemia 09/16/2022   Pelvic floor weakness in female 03/03/2020   Polyp of ascending colon    Abdominal pain, chronic, epigastric    Carotid atherosclerosis, bilateral 11/06/2017   Hx of completed stroke 10/28/2017   Skin pigmentation disorder 07/24/2016   Allergic rhinitis 06/30/2016   Eczema 09/13/2015   Irritable bowel syndrome with constipation 09/13/2015   Seizures (HCC) 08/16/2015   Bickerstaff's migraine 07/06/2012    Past Medical History: Past Medical History:  Diagnosis Date   Allergic rhinitis 06/30/2016   Allergy    Carotid atherosclerosis, bilateral 11/06/2017   [place] Nov 2018   Cyst of breast, left, solitary    Flank pain    GERD (gastroesophageal reflux disease)    Hemorrhoid    Hemorrhoids    Hyperlipidemia    IFG (  impaired fasting glucose) 07/18/2016   June 2017    Kidney stone on left side 10/24/2015   Kidney stones    Neuropathy 07/24/2016   Seizures (HCC)    Shingles outbreak 02/03/2017   Stomach irritation    Syncope    Recurrent over the last 2 years. had workup  at [facility] this year with cardiology and neurology. Had echo and 3-week event monitor that were unremarkable. Has had negative EEG. Echo in 11/11 at [facility] showed no significant abnormalities. Possibly vasovagal   TIA (transient ischemic attack) 10/28/2017   Trigger thumb     Past Surgical History: Past Surgical History:  Procedure Laterality Date   ABDOMINAL HYSTERECTOMY  40s   endometriosis, fibroid tumors; no cancer, partial   BREAST EXCISIONAL BIOPSY Left 1980'S   EXCISIONAL - NEG   BREAST SURGERY     CHOLECYSTECTOMY  31   COLONOSCOPY WITH PROPOFOL N/A 06/23/2019   Procedure: COLONOSCOPY WITH PROPOFOL;  Surgeon: Pasty Spillers, MD;  Location: [facility] ENDOSCOPY;  Service: Endoscopy;  Laterality: N/A;   COLONOSCOPY WITH PROPOFOL N/A 12/28/2020   Procedure: COLONOSCOPY WITH PROPOFOL;  Surgeon: Pasty Spillers, MD;  Location: [facility] ENDOSCOPY;  Service: Endoscopy;  Laterality: N/A;   ESOPHAGOGASTRODUODENOSCOPY (EGD) WITH PROPOFOL N/A 06/23/2019   Procedure: ESOPHAGOGASTRODUODENOSCOPY (EGD) WITH PROPOFOL;  Surgeon: Pasty Spillers, MD;  Location: [facility] ENDOSCOPY;  Service: Endoscopy;  Laterality: N/A;   ESOPHAGOGASTRODUODENOSCOPY (EGD) WITH PROPOFOL N/A 12/28/2020   Procedure: ESOPHAGOGASTRODUODENOSCOPY (EGD) WITH PROPOFOL;  Surgeon: Pasty Spillers, MD;  Location: [facility] ENDOSCOPY;  Service: Endoscopy;  Laterality: N/A;   ESOPHAGOGASTRODUODENOSCOPY (EGD) WITH PROPOFOL N/A 12/27/2022   Procedure: ESOPHAGOGASTRODUODENOSCOPY (EGD) WITH PROPOFOL;  Surgeon: Wyline Mood, MD;  Location: [facility] ENDOSCOPY;  Service: Gastroenterology;  Laterality: N/A;   FOOT SURGERY Right    tendon release   VULVECTOMY PARTIAL N/A 01/01/2023   Procedure: EXAM UNDER ANESTHESIA,VULVECTOMY PARTIAL, COLPOSCOPY;  Surgeon: Artelia Laroche, MD;  Location: [facility] ORS;  Service: Gynecology;  Laterality: N/A;    Past Gynecologic History:  As per HPI  OB History:  OB History  Gravida Para Term Preterm AB  Living  2 2    2   SAB IAB Ectopic Multiple Live Births          # Outcome Date GA Lbr Len/2nd Weight Sex Type Anes PTL Lv  2 Para           1 Para             Obstetric Comments  Menstrual age: 4    Age 1st Pregnancy: 58    Family History: Family History  Problem Relation Age of Onset   Coronary artery disease Maternal Uncle    Diabetes Mellitus II Mother    Arthritis Mother    Diabetes Maternal Uncle    Kidney cancer Father 88       died 05/07/17   Kidney disease Father    Cancer Father        kidney and bladder   Arthritis Maternal Uncle    Cancer Maternal Grandmother        stomach?   Jaundice Maternal Grandmother    Stomach cancer Paternal Grandmother    Cancer Paternal Grandmother        stomach   Hypertension Sister    Stroke Maternal Grandfather    Diabetes Paternal Grandfather    Diabetes Sister    Hypertension Sister    Breast cancer Neg Hx    Bladder Cancer Neg Hx  Social History: Social History   Socioeconomic History   Marital status: Legally Separated    Spouse name: Not on file   Number of children: 2   Years of education: Not on file   Highest education level: GED or equivalent  Occupational History   Occupation: unemployed  Tobacco Use   Smoking status: Never    Passive exposure: Never   Smokeless tobacco: Never  Vaping Use   Vaping status: Never Used  Substance and Sexual Activity   Alcohol use: Yes    Comment: occasionally   Drug use: Never   Sexual activity: Yes    Partners: Male    Birth control/protection: Surgical    Comment: Hysterectomy  Other Topics Concern   Not on file  Social History Narrative   Regular exercise   Social Drivers of Health   Financial Resource Strain: Low Risk  (06/06/2023)   Overall Financial Resource Strain (CARDIA)    Difficulty of Paying Living Expenses: Not hard at all  Food Insecurity: No Food Insecurity (06/06/2023)   Hunger Vital Sign    Worried About Running Out of Food in the  Last Year: Never true    Ran Out of Food in the Last Year: Never true  Transportation Needs: No Transportation Needs (06/06/2023)   PRAPARE - Administrator, Civil Service (Medical): No    Lack of Transportation (Non-Medical): No  Physical Activity: Sufficiently Active (06/06/2023)   Exercise Vital Sign    Days of Exercise per Week: 7 days    Minutes of Exercise per Session: 30 min  Stress: No Stress Concern Present (06/06/2023)   [employer] of Occupational Health - Occupational Stress Questionnaire    Feeling of Stress : Not at all  Social Connections: Moderately Isolated (06/06/2023)   Social Connection and Isolation Panel [NHANES]    Frequency of Communication with Friends and Family: More than three times a week    Frequency of Social Gatherings with Friends and Family: Three times a week    Attends Religious Services: More than 4 times per year    Active Member of Clubs or Organizations: No    Attends Banker Meetings: Never    Marital Status: Separated  Intimate Partner Violence: Not At Risk (06/06/2023)   Humiliation, Afraid, Rape, and Kick questionnaire    Fear of Current or Ex-Partner: No    Emotionally Abused: No    Physically Abused: No    Sexually Abused: No    Allergies: Allergies  Allergen Reactions   Clindamycin/Lincomycin Hives   Hydrocodone Nausea Only   Metronidazole Hives   Sulfa Antibiotics Hives    Break out in hives all over   Oxycodone Nausea And Vomiting    Current Medications: Current Outpatient Medications  Medication Sig Dispense Refill   aspirin EC 81 MG EC tablet Take 1 tablet (81 mg total) by mouth daily. 30 tablet 1   atorvastatin (LIPITOR) 80 MG tablet Take 1 tablet (80 mg total) by mouth daily. 90 tablet 3   ezetimibe (ZETIA) 10 MG tablet Take 1 tablet (10 mg total) by mouth daily. 90 tablet 3   fexofenadine (ALLEGRA) 180 MG tablet Take 180 mg by mouth daily.     Fluocinolone Acetonide 0.01 % OIL Apply topically 1 -  2 drops qd/bid prn for itch inside ears 20 mL 11   hydroquinone 4 % cream Apply twice daily to dark spots on face. Use no more than 3 months at a time. 28.35 g 3  mometasone (ELOCON) 0.1 % cream Apply topically to flares at body qd/bid prn for up to 2 weeks. Avoid applying to face, groin, and axilla. Use as directed. 45 g 3   omeprazole (PRILOSEC) 40 MG capsule Take 1 capsule (40 mg total) by mouth daily as needed (epigastric pain, reflux, indigestion). 30 capsule 3   Rimegepant Sulfate (NURTEC) 75 MG TBDP Take by mouth.     tacrolimus (PROTOPIC) 0.1 % ointment Apply topically as directed. qd to bid aa itchy rash on face and body 100 g 1   topiramate (TOPAMAX) 200 MG tablet Take 200 mg by mouth 2 (two) times daily.   11   No current facility-administered medications for this visit.    Review of Systems General: no complaints  HEENT: no complaints  Lungs: no complaints, chronic complaint  Cardiac: no complaints  GI: no complaints        Musculoskeletal: no complaints  Extremities: no complaints  Skin: yes as noted in GU  Neuro: no complaints  Endocrine: no complaints  Psych: no complaints     Objective:  Physical Examination:  BP 116/81   Pulse 72   Temp (!) 96.6 F (35.9 C)   Resp 20   Wt 132 lb 3.2 oz (60 kg)   SpO2 100%   BMI 26.70 kg/m    GENERAL: Patient is a well appearing female in no acute distress HEENT:  Atraumatic normocephalic ABDOMEN:  Soft, nontender, nondistended. No masses or ascites.   EXTREMITIES:  No peripheral edema.   NEURO:  Nonfocal. Well oriented.  Appropriate affect.  Pelvic: chaperoned by CMA EGBUS: healed incision on the right vulva. 2-3 cm in length the keloid is not as prominent. 1 cm firmness which is improved.  Vagina: there are two visible paravaginal cystic lesions ~ 8 mm in size.  Uterus: surgically absent.  BME: palpable multiple paravaginal cystic lesions ~ 8 mm in size along the right vaginal sidewall and vaginal apex   Lab  Review Per hpi  Radiologic Imaging: Per HPI     Assessment:  VIVYAN BIGGERS is a 63 y.o. 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 Biopsy of small lesion 12/23 showed squamous cell cancer.  Entire biopsy was only 3 mm, so depth of invasion unclear.  01/01/23- EUA, colposcopy, and WLE of vulva. No residual dysplasia or cancer in the pathology specimen. 07/2023 concern for keloid formation, biopsies all negative. Currently on topical Serica. No new complaints and NED on exam.   H/o hypercalcemia  Medical co-morbidities complicating care: h/o Carotid atherosclerosis, bilateral with TIA 2018; Hyperlipidemia and h/o seizures on therapy Plan:   Problem List Items Addressed This Visit       Genitourinary   Vulvar cancer (HCC) - Primary    She will continue the [place]. We hope she will follow up with dermatology. She seems please with the keloid response to therapy and feels that the biopsies also helped remove the lesion.   Continue surveillance with RTC in 6 months.     The patient's diagnosis, an outline of the further diagnostic and laboratory studies which will be required, the recommendation, and alternatives were discussed.  All questions were answered to the patient's satisfaction.  I personally interviewed and examined the patient. Agreed with the above/below plan of care. I have directly contributed to assessment and plan of care of this patient and educated and discussed with patient and family.   Leida Lauth, MD

## 2024-02-12 ENCOUNTER — Ambulatory Visit: Payer: Medicare HMO | Admitting: Dermatology

## 2024-03-27 ENCOUNTER — Emergency Department (HOSPITAL_COMMUNITY)

## 2024-03-27 ENCOUNTER — Other Ambulatory Visit: Payer: Self-pay

## 2024-03-27 ENCOUNTER — Emergency Department (HOSPITAL_COMMUNITY)
Admission: EM | Admit: 2024-03-27 | Discharge: 2024-03-27 | Disposition: A | Discharge status: Home / Self Care | Attending: Emergency Medicine | Admitting: Emergency Medicine

## 2024-03-27 DIAGNOSIS — R531 Weakness: Secondary | ICD-10-CM | POA: Insufficient documentation

## 2024-03-27 DIAGNOSIS — R42 Dizziness and giddiness: Secondary | ICD-10-CM | POA: Diagnosis not present

## 2024-03-27 DIAGNOSIS — Z79899 Other long term (current) drug therapy: Secondary | ICD-10-CM | POA: Diagnosis not present

## 2024-03-27 DIAGNOSIS — R519 Headache, unspecified: Secondary | ICD-10-CM | POA: Diagnosis not present

## 2024-03-27 DIAGNOSIS — G43909 Migraine, unspecified, not intractable, without status migrainosus: Secondary | ICD-10-CM

## 2024-03-27 DIAGNOSIS — R29818 Other symptoms and signs involving the nervous system: Secondary | ICD-10-CM | POA: Diagnosis not present

## 2024-03-27 DIAGNOSIS — R202 Paresthesia of skin: Secondary | ICD-10-CM | POA: Diagnosis not present

## 2024-03-27 DIAGNOSIS — Z743 Need for continuous supervision: Secondary | ICD-10-CM | POA: Diagnosis not present

## 2024-03-27 DIAGNOSIS — R791 Abnormal coagulation profile: Secondary | ICD-10-CM | POA: Insufficient documentation

## 2024-03-27 DIAGNOSIS — Z7982 Long term (current) use of aspirin: Secondary | ICD-10-CM | POA: Insufficient documentation

## 2024-03-27 DIAGNOSIS — I10 Essential (primary) hypertension: Secondary | ICD-10-CM | POA: Diagnosis not present

## 2024-03-27 DIAGNOSIS — R410 Disorientation, unspecified: Secondary | ICD-10-CM | POA: Diagnosis not present

## 2024-03-27 LAB — CBC
HCT: 40.7 % (ref 36.0–46.0)
Hemoglobin: 13.7 g/dL (ref 12.0–15.0)
MCH: 31.7 pg (ref 26.0–34.0)
MCHC: 33.7 g/dL (ref 30.0–36.0)
MCV: 94.2 fL (ref 80.0–100.0)
Platelets: 237 10*3/uL (ref 150–400)
RBC: 4.32 MIL/uL (ref 3.87–5.11)
RDW: 12.4 % (ref 11.5–15.5)
WBC: 5.2 10*3/uL (ref 4.0–10.5)
nRBC: 0 % (ref 0.0–0.2)

## 2024-03-27 LAB — CBG MONITORING, ED: Glucose-Capillary: 82 mg/dL (ref 70–99)

## 2024-03-27 LAB — I-STAT CHEM 8, ED
BUN: 6 mg/dL — ABNORMAL LOW (ref 8–23)
Calcium, Ion: 1.17 mmol/L (ref 1.15–1.40)
Chloride: 112 mmol/L — ABNORMAL HIGH (ref 98–111)
Creatinine, Ser: 0.9 mg/dL (ref 0.44–1.00)
Glucose, Bld: 85 mg/dL (ref 70–99)
HCT: 40 % (ref 36.0–46.0)
Hemoglobin: 13.6 g/dL (ref 12.0–15.0)
Potassium: 3.2 mmol/L — ABNORMAL LOW (ref 3.5–5.1)
Sodium: 143 mmol/L (ref 135–145)
TCO2: 18 mmol/L — ABNORMAL LOW (ref 22–32)

## 2024-03-27 LAB — ETHANOL: Alcohol, Ethyl (B): <10 mg/dL (ref <10)

## 2024-03-27 LAB — DIFFERENTIAL
Abs Immature Granulocytes: 0.01 10*3/uL (ref 0.00–0.07)
Basophils Absolute: 0 10*3/uL (ref 0.0–0.1)
Basophils Relative: 1 %
Eosinophils Absolute: 0 10*3/uL (ref 0.0–0.5)
Eosinophils Relative: 1 %
Immature Granulocytes: 0 %
Lymphocytes Relative: 53 %
Lymphs Abs: 2.8 10*3/uL (ref 0.7–4.0)
Monocytes Absolute: 0.3 10*3/uL (ref 0.1–1.0)
Monocytes Relative: 7 %
Neutro Abs: 2 10*3/uL (ref 1.7–7.7)
Neutrophils Relative %: 38 %

## 2024-03-27 LAB — COMPREHENSIVE METABOLIC PANEL WITH GFR
ALT: 57 U/L — ABNORMAL HIGH (ref 0–44)
AST: 68 U/L — ABNORMAL HIGH (ref 15–41)
Albumin: 3.5 g/dL (ref 3.5–5.0)
Alkaline Phosphatase: 70 U/L (ref 38–126)
Anion gap: 8 (ref 5–15)
BUN: 7 mg/dL — ABNORMAL LOW (ref 8–23)
CO2: 17 mmol/L — ABNORMAL LOW (ref 22–32)
Calcium: 8.8 mg/dL — ABNORMAL LOW (ref 8.9–10.3)
Chloride: 114 mmol/L — ABNORMAL HIGH (ref 98–111)
Creatinine, Ser: 0.86 mg/dL (ref 0.44–1.00)
GFR, Estimated: >60 mL/min (ref >60)
Glucose, Bld: 94 mg/dL (ref 70–99)
Potassium: 4.1 mmol/L (ref 3.5–5.1)
Sodium: 139 mmol/L (ref 135–145)
Total Bilirubin: 1.1 mg/dL (ref 0.0–1.2)
Total Protein: 5.9 g/dL — ABNORMAL LOW (ref 6.5–8.1)

## 2024-03-27 LAB — APTT: aPTT: 26 s (ref 24–36)

## 2024-03-27 LAB — PROTIME-INR
INR: 1 (ref 0.8–1.2)
Prothrombin Time: 13.5 s (ref 11.4–15.2)

## 2024-03-27 MED ORDER — PROCHLORPERAZINE EDISYLATE 10 MG/2ML IJ SOLN
5.0000 mg | Freq: Once | INTRAMUSCULAR | Status: AC
  Administered 2024-03-27: 5 mg via INTRAVENOUS
  Filled 2024-03-27: qty 2

## 2024-03-27 MED ORDER — DIPHENHYDRAMINE HCL 50 MG/ML IJ SOLN
12.5000 mg | Freq: Once | INTRAMUSCULAR | Status: AC
  Administered 2024-03-27: 12.5 mg via INTRAVENOUS
  Filled 2024-03-27: qty 1

## 2024-03-27 MED ORDER — SODIUM CHLORIDE 0.9% FLUSH
3.0000 mL | Freq: Once | INTRAVENOUS | Status: AC
  Administered 2024-03-27: 3 mL via INTRAVENOUS

## 2024-03-27 NOTE — Code Documentation (Signed)
 Stroke Response Nurse Documentation Code Documentation  KATHALEEN DUDZIAK is a 63 y.o. female arriving to [facility]  via [facility] on 03/27/24 with past medical hx of vestibular/basilar artery migraines, TIA, CVA, vulvar cancer, IBS, HLD, and bilateral carotid arthrosclerosis . On No antithrombotic. Code stroke was activated by EMS.   Patient from home where she was LKW at 2000 yesterday and now complaining of left sided weakness and dizziness. Pt states she woke up feeling dizzy with pins and needles on left side. She went to daughters house where symptoms continued along with some AMS and EMS was called.   Stroke team at the bedside on patient arrival. Labs drawn and patient cleared for CT. Patient to CT with team. NIHSS 1, see documentation for details and code stroke times.  The following imaging was completed:  CT Head. Patient is not a candidate for IV Thrombolytic due to outside window. Patient is not a candidate for IR due to no LVO suspected.   Care Plan: q2h neuro and vitals.   Bedside handoff with ED RN .    Mordecai Rasmussen  Stroke Response RN

## 2024-03-27 NOTE — ED Triage Notes (Signed)
 Pt to ED from home via EMS as Code Stroke. LKW 2000 yesterday 03/26/2024. Woke up around 0700 today with dizziness & left sided weakness, decreased sensation, & "just felt off". VSS.   118/72 99% 80 18g L AC. 4mg  IV zofran. BG 90s

## 2024-03-27 NOTE — Consult Note (Signed)
 NEUROLOGY CONSULT NOTE   Date of service: March 27, 2024 Patient Name: Elizabeth Mata MRN:  [id number] DOB:  [birth year] Chief Complaint: "Left-sided tingling and I feel off" Requesting Provider: Melene Plan, DO  History of Present Illness  Elizabeth Mata is a 63 y.o. female with hx of vestibular/basilar artery migraines, TIA, CVA with remote small infarcts of the right thalamus and basal ganglia without residual deficit, vulvar cancer, IBS, questionable history of seizures without recurrence in over 12 years, hyperlipidemia, and bilateral carotid arthrosclerosis who presented to the ED via EMS for evaluation of dizziness, left lower extremity weakness, and left arm and occasionally left leg pins and needle sensation.  She was last in her usual state of health before bed at 20:00 last night 3/28. When she woke up this morning she began to feel dizzy and nauseated.  She took a shower this morning and noticed pins and needle sensation in the left upper extremity and again noticed the sensory disturbance when driving to her daughter's house this afternoon.  She arrived at her daughter's house after 1:30 PM and began to complain of worsening dizziness, left-sided tingling, left hemicranial pressure headache, shortness of breath, and she seemed disoriented prompting EMS activation.    Regarding patient's migraines, patient has been followed by Dr. Delila Spence at [facility] since 2012 for episodic spells of transient atypical neurologic presentation followed by throbbing hemicranial headaches with dizziness, lightheadedness, at times loss of consciousness.  She is well controlled on topiramate and gabapentin for her spells felt to represent basilar migraines versus simple partial seizures.  Her migraines are exacerbated by stress and well controlled with her current medications. She has had similar presentations in the past with dizziness, nausea, and imbalance on waking and work up revealed MRI brain with remote small  infarcts.  She had some relief with Bernita Raisin for abortive medication outpatient in 2023 though due to insurance coverage, this prescription was stopped.   LKW: 03/26/24 20:00 Modified rankin score: 0-Completely asymptomatic and back to baseline post- stroke IV Thrombolysis: No, presented outside of the thrombolytic therapy time window EVT:  No, presentation is not consistent with an LVO  NIHSS components Score: Comment  1a Level of Conscious 0[x]  1[]  2[]  3[]      1b LOC Questions 0[x]  1[]  2[]       1c LOC Commands 0[x]  1[]  2[]       2 Best Gaze 0[x]  1[]  2[]       3 Visual 0[x]  1[]  2[]  3[]      4 Facial Palsy 0[x]  1[]  2[]  3[]      5a Motor Arm - left 0[x]  1[]  2[]  3[]  4[]  UN[]    5b Motor Arm - Right 0[x]  1[]  2[]  3[]  4[]  UN[]    6a Motor Leg - Left 0[]  1[x]  2[]  3[]  4[]  UN[]    6b Motor Leg - Right 0[x]  1[]  2[]  3[]  4[]  UN[]    7 Limb Ataxia 0[x]  1[]  2[]  3[]  UN[]     8 Sensory 0[x]  1[]  2[]  UN[]      9 Best Language 0[]  1[x]  2[]  3[]     Fluctuates from 0-1.  Patient states speech is at baseline though she does seem to have trouble word finding with increased anxiety.  10 Dysarthria 0[x]  1[]  2[]  UN[]      11 Extinct. and Inattention 0[x]  1[]  2[]       TOTAL:  2    ROS  Comprehensive ROS performed and pertinent positives documented in HPI   Past History   Past Medical History:  Diagnosis Date   Allergic  rhinitis 06/30/2016   Allergy    Carotid atherosclerosis, bilateral 11/06/2017   [place] Nov 2018   Cyst of breast, left, solitary    Flank pain    GERD (gastroesophageal reflux disease)    Hemorrhoid    Hemorrhoids    Hyperlipidemia    IFG (impaired fasting glucose) 07/18/2016   June 2017    Kidney stone on left side 10/24/2015   Kidney stones    Neuropathy 07/24/2016   Seizures (HCC)    Shingles outbreak 02/03/2017   Stomach irritation    Syncope    Recurrent over the last 2 years. had workup at [facility] this year with cardiology and neurology. Had echo and 3-week event monitor that were unremarkable.  Has had negative EEG. Echo in 11/11 at [facility] showed no significant abnormalities. Possibly vasovagal   TIA (transient ischemic attack) 10/28/2017   Trigger thumb    Past Surgical History:  Procedure Laterality Date   ABDOMINAL HYSTERECTOMY  40s   endometriosis, fibroid tumors; no cancer, partial   BREAST EXCISIONAL BIOPSY Left 1980'S   EXCISIONAL - NEG   BREAST SURGERY     CHOLECYSTECTOMY  31   COLONOSCOPY WITH PROPOFOL N/A 06/23/2019   Procedure: COLONOSCOPY WITH PROPOFOL;  Surgeon: Pasty Spillers, MD;  Location: [facility] ENDOSCOPY;  Service: Endoscopy;  Laterality: N/A;   COLONOSCOPY WITH PROPOFOL N/A 12/28/2020   Procedure: COLONOSCOPY WITH PROPOFOL;  Surgeon: Pasty Spillers, MD;  Location: [facility] ENDOSCOPY;  Service: Endoscopy;  Laterality: N/A;   ESOPHAGOGASTRODUODENOSCOPY (EGD) WITH PROPOFOL N/A 06/23/2019   Procedure: ESOPHAGOGASTRODUODENOSCOPY (EGD) WITH PROPOFOL;  Surgeon: Pasty Spillers, MD;  Location: [facility] ENDOSCOPY;  Service: Endoscopy;  Laterality: N/A;   ESOPHAGOGASTRODUODENOSCOPY (EGD) WITH PROPOFOL N/A 12/28/2020   Procedure: ESOPHAGOGASTRODUODENOSCOPY (EGD) WITH PROPOFOL;  Surgeon: Pasty Spillers, MD;  Location: [facility] ENDOSCOPY;  Service: Endoscopy;  Laterality: N/A;   ESOPHAGOGASTRODUODENOSCOPY (EGD) WITH PROPOFOL N/A 12/27/2022   Procedure: ESOPHAGOGASTRODUODENOSCOPY (EGD) WITH PROPOFOL;  Surgeon: Wyline Mood, MD;  Location: [facility] ENDOSCOPY;  Service: Gastroenterology;  Laterality: N/A;   FOOT SURGERY Right    tendon release   VULVECTOMY PARTIAL N/A 01/01/2023   Procedure: EXAM UNDER ANESTHESIA,VULVECTOMY PARTIAL, COLPOSCOPY;  Surgeon: Artelia Laroche, MD;  Location: [facility] ORS;  Service: Gynecology;  Laterality: N/A;   Family History: Family History  Problem Relation Age of Onset   Coronary artery disease Maternal Uncle    Diabetes Mellitus II Mother    Arthritis Mother    Diabetes Maternal Uncle    Kidney cancer Father 3       died 2024-05-15 2018   Kidney disease Father    Cancer Father        kidney and bladder   Arthritis Maternal Uncle    Cancer Maternal Grandmother        stomach?   Jaundice Maternal Grandmother    Stomach cancer Paternal Grandmother    Cancer Paternal Grandmother        stomach   Hypertension Sister    Stroke Maternal Grandfather    Diabetes Paternal Grandfather    Diabetes Sister    Hypertension Sister    Breast cancer Neg Hx    Bladder Cancer Neg Hx    Social History  reports that she has never smoked. She has never been exposed to tobacco smoke. She has never used smokeless tobacco. She reports current alcohol use. She reports that she does not use drugs.  Allergies  Allergen Reactions   Clindamycin/Lincomycin Hives   Hydrocodone Nausea  Only   Metronidazole Hives   Sulfa Antibiotics Hives    Break out in hives all over   Oxycodone Nausea And Vomiting   Medications   Current Facility-Administered Medications:    sodium chloride flush (NS) 0.9 % injection 3 mL, 3 mL, Intravenous, Once, Melene Plan, DO  Current Outpatient Medications:    aspirin EC 81 MG EC tablet, Take 1 tablet (81 mg total) by mouth daily., Disp: 30 tablet, Rfl: 1   atorvastatin (LIPITOR) 80 MG tablet, Take 1 tablet (80 mg total) by mouth daily., Disp: 90 tablet, Rfl: 3   ezetimibe (ZETIA) 10 MG tablet, Take 1 tablet (10 mg total) by mouth daily., Disp: 90 tablet, Rfl: 3   fexofenadine (ALLEGRA) 180 MG tablet, Take 180 mg by mouth daily., Disp: , Rfl:    Fluocinolone Acetonide 0.01 % OIL, Apply topically 1 - 2 drops qd/bid prn for itch inside ears, Disp: 20 mL, Rfl: 11   hydroquinone 4 % cream, Apply twice daily to dark spots on face. Use no more than 3 months at a time., Disp: 28.35 g, Rfl: 3   mometasone (ELOCON) 0.1 % cream, Apply topically to flares at body qd/bid prn for up to 2 weeks. Avoid applying to face, groin, and axilla. Use as directed., Disp: 45 g, Rfl: 3   omeprazole (PRILOSEC) 40 MG capsule, Take 1  capsule (40 mg total) by mouth daily as needed (epigastric pain, reflux, indigestion)., Disp: 30 capsule, Rfl: 3   Rimegepant Sulfate (NURTEC) 75 MG TBDP, Take by mouth., Disp: , Rfl:    tacrolimus (PROTOPIC) 0.1 % ointment, Apply topically as directed. qd to bid aa itchy rash on face and body, Disp: 100 g, Rfl: 1   topiramate (TOPAMAX) 200 MG tablet, Take 200 mg by mouth 2 (two) times daily. , Disp: , Rfl: 11  Vitals  There were no vitals filed for this visit.  There is no height or weight on file to calculate BMI.  Physical Exam   Constitutional: Appears well-developed and well-nourished Psych: Patient has significant anxiety during examination.  She remains cooperative throughout exam. Eyes: No scleral injection.  HENT: No OP obstruction.  Head: Normocephalic.  Cardiovascular: Normal rate and regular rhythm.  Respiratory: Effort normal, non-labored breathing on room air GI: Soft.  No distension. There is no tenderness.  Skin: WDI.   Neurologic Examination   Mental Status: Patient is awake, alert, oriented to person, place, month, year, and situation. Patient is able to give a clear and coherent history with some loss of fluency with stuttering speech and occasionally she appears to have difficulty with word finding in conversation.  She is able to name objects correctly.  Her speech disturbance is intermittent and fluctuates at times with her level of anxiety. There are no signs of neglect.   Patient follows simple commands without difficulty.  Cranial Nerves: II: Visual Fields are full. Pupils are equal, round, and reactive to light.   III,IV, VI: EOMI without ptosis or diploplia.  V: Facial sensation is intact and symmetric to light touch VII: Face is symmetric resting and with movement  VIII: Hearing is intact to voice Motor: Tone is normal. Bulk is normal.  Bilateral upper extremities elevate antigravity without vertical drift.  Right lower extremity elevates antigravity  without vertical drift.  Left lower extremity has vertical drift and wobbling with antigravity positioning, inconsistent and distractible  Sensory: Reporting is inconsistent.  At times patient states that she has no sensory disturbance on the left.  On repeat exam, patient states that she has a pins and needles sensation to the left upper extremity that occasionally extends down the left leg. Cerebellar: FNF and HKS are intact bilaterally Gait: Requires 2 person assistance with short, slow steps.  She does have some giveway weakness after standing for approximately one minute.    Labs/Imaging/Neurodiagnostic studies   CBC:  Recent Labs  Lab April 15, 2024 1528  HGB 13.6  HCT 40.0   Basic Metabolic Panel:  Lab Results  Component Value Date   NA 143 2024-04-15   K 3.2 (L) 04/15/24   CO2 23 03/17/2023   GLUCOSE 85 04-15-2024   BUN 6 (L) 2024/04/15   CREATININE 0.90 04-15-24   CALCIUM 9.7 03/17/2023   GFRNONAA >60 12/18/2022   GFRAA >60 08/05/2020   Lipid Panel:  Lab Results  Component Value Date   LDLCALC 93 03/17/2023   HgbA1c:  Lab Results  Component Value Date   HGBA1C 5.6 08/06/2021   Urine Drug Screen: No results found for: "LABOPIA", "COCAINSCRNUR", "LABBENZ", "AMPHETMU", "THCU", "LABBARB"  Alcohol Level     Component Value Date/Time   ETH <10 08/05/2020 0533   INR  Lab Results  Component Value Date   INR 0.98 01/23/2018   APTT  Lab Results  Component Value Date   APTT 30 01/23/2018   AED levels: No results found for: "PHENYTOIN", "ZONISAMIDE", "LAMOTRIGINE", "LEVETIRACETA"  CT Head without contrast(Personally reviewed): Unremarkable CT appearance of the brain. ASPECTS of 10.   MRI Brain ordered, pending  ASSESSMENT   DONEISHA IVEY is a 63 y.o. female with PMHx of migraine headaches on topiramate and gabapentin, CVA in 2019 without residual deficit, vulvar cancer, IBS, questionable history of seizures without recurrence in over 12 years, HLD,  bilateral carotid atherosclerosis who presented to the ED for evaluation of dizziness since waking up, left arm sensory disturbance with pins and needle sensation, nausea, and some disorientation. - Exam reveals highly anxious patient with stuttering speech and questionable word finding difficulty versus anxiety-related speech deficit.  Left lower extremity does have vertical drift on assessment and patient complains of left upper extremity pins-and-needles sensation and occasional involvement of the left lower extremity. She complains of a left hemicranial pressure headache and feeling "off".  - CTH obtained without acute finding. TNK not given due to patient presenting outside of the thrombolytic therapy time window. Will obtain MRI brain to rule out stroke.  - Presentation is felt to be consistent with her documented migraine headache presentations with history of transient atypical neurologic spells followed by throbbing hemicranial headaches with dizziness and lightheadedness by outpatient neurology provider at [facility], Dr. Delila Spence.  Similar presentation today with dizziness and complaints of left hemicranial pressure headache and feeling "off".  Will obtain topiramate level and recommend headache treatment via migraine cocktail. Could also consider central pain syndrome as a complication of her prior right thalamic stroke contributing to her symptoms. Will obtain MRI brain to rule out stroke given multiple risk factors and patient's difficulty communicating a clear history at this time.   RECOMMENDATIONS  - Headache management with migraine cocktail per EDP - MRI brain to rule out stroke - Outpatient provider to follow-up topirimate level  - If MRI brain with acute ischemic stroke, will expand full stroke work up at that time, otherwise reassurance and outpatient follow-up ______________________________________________________________________  Signed, Kara Mead, NP [facility]  Attending Neurologist's note:  I personally saw this patient, gathering history, performing a full neurologic examination, reviewing relevant labs,  personally reviewing relevant imaging including Head CT, and formulated the assessment and plan, adding the note above for completeness and clarity to accurately reflect my thoughts  Brooke Dare MD-PhD [facility] [phone]  *** Billing

## 2024-03-27 NOTE — Discharge Instructions (Signed)
 I am glad that you are feeling better.  Please follow-up with your urologist in the office.  Your MRI did not show stroke.

## 2024-03-27 NOTE — ED Notes (Signed)
 Patient transported to MRI

## 2024-03-27 NOTE — ED Provider Notes (Signed)
 [place] EMERGENCY DEPARTMENT AT [facility] Provider Note   CSN: [id number] Arrival date & time: 03/27/24  1520  An emergency department physician performed an initial assessment on this suspected stroke patient at 1520.  History  Chief Complaint  Patient presents with   Code Stroke    Elizabeth Mata is a 63 y.o. female.  63 yoF with a chief complaints of feeling like her left side was weak and feeling a bit dizzy.  This started when she woke up this morning.  Symptoms did not seem to be improving and so came to our department for evaluation.  Arrived here as a code stroke.  She also has a left-sided headache as well.  Level 5 caveat acuity of condition.        Home Medications Prior to Admission medications   Medication Sig Start Date End Date Taking? Authorizing Provider  aspirin EC 81 MG EC tablet Take 1 tablet (81 mg total) by mouth daily. 10/10/17  Yes Houston Siren, MD  atorvastatin (LIPITOR) 80 MG tablet Take 1 tablet (80 mg total) by mouth daily. 03/17/23  Yes Danelle Berry, PA-C  ezetimibe (ZETIA) 10 MG tablet Take 1 tablet (10 mg total) by mouth daily. Patient taking differently: Take 10 mg by mouth every evening. 03/17/23  Yes Danelle Berry, PA-C  ibuprofen (ADVIL) 200 MG tablet Take 200 mg by mouth daily as needed for headache.   Yes [provider]  mometasone (ELOCON) 0.1 % cream Apply topically to flares at body qd/bid prn for up to 2 weeks. Avoid applying to face, groin, and axilla. Use as directed. 10/20/23  Yes Willeen Niece, MD  topiramate (TOPAMAX) 200 MG tablet Take 200 mg by mouth 2 (two) times daily.  01/23/17  Yes [provider]      Allergies    Clindamycin/lincomycin, Flagyl [metronidazole], Hydrocodone, Sulfa antibiotics, and Roxicodone [oxycodone]    Review of Systems   Review of Systems  Physical Exam Updated Vital Signs BP (!) 132/98   Pulse 98   Temp 98.3 F (36.8 C) (Oral)   Resp 17   Ht 4\' 11"  (1.499 m)    Wt 61.9 kg   SpO2 100%   BMI 27.57 kg/m  Physical Exam Vitals and nursing note reviewed.  Constitutional:      General: She is not in acute distress.    Appearance: She is well-developed. She is not diaphoretic.  HENT:     Head: Normocephalic and atraumatic.  Eyes:     Pupils: Pupils are equal, round, and reactive to light.  Cardiovascular:     Rate and Rhythm: Normal rate and regular rhythm.     Heart sounds: No murmur heard.    No friction rub. No gallop.  Pulmonary:     Effort: Pulmonary effort is normal.     Breath sounds: No wheezing or rales.  Abdominal:     General: There is no distension.     Palpations: Abdomen is soft.     Tenderness: There is no abdominal tenderness.  Musculoskeletal:        General: No tenderness.     Cervical back: Normal range of motion and neck supple.  Skin:    General: Skin is warm and dry.  Neurological:     Mental Status: She is alert and oriented to person, place, and time.  Psychiatric:        Behavior: Behavior normal.     ED Results / Procedures / Treatments  Labs (all labs ordered are listed, but only abnormal results are displayed) Labs Reviewed  COMPREHENSIVE METABOLIC PANEL WITH GFR - Abnormal; Notable for the following components:      Result Value   Chloride 114 (*)    CO2 17 (*)    BUN 7 (*)    Calcium 8.8 (*)    Total Protein 5.9 (*)    AST 68 (*)    ALT 57 (*)    All other components within normal limits  I-STAT CHEM 8, ED - Abnormal; Notable for the following components:   Potassium 3.2 (*)    Chloride 112 (*)    BUN 6 (*)    TCO2 18 (*)    All other components within normal limits  PROTIME-INR  APTT  CBC  DIFFERENTIAL  ETHANOL  TOPIRAMATE LEVEL  CBG MONITORING, ED    EKG EKG Interpretation Date/Time:  Saturday March 27 2024 15:57:41 EDT Ventricular Rate:  83 PR Interval:  156 QRS Duration:  83 QT Interval:  387 QTC Calculation: 455 R Axis:   22  Text Interpretation: Sinus rhythm Borderline  T wave abnormalities No significant change since last tracing Confirmed by Melene Plan [phone]) on 03/27/2024 4:14:45 PM  Radiology MR BRAIN WO CONTRAST Result Date: 03/27/2024 CLINICAL DATA:  Neuro deficit, acute, stroke suspected. Dizziness and left-sided weakness and paresthesias. EXAM: MRI HEAD WITHOUT CONTRAST TECHNIQUE: Multiplanar, multiecho pulse sequences of the brain and surrounding structures were obtained without intravenous contrast. COMPARISON:  Head CT 03/27/2024 and MRI 04/30/2021 FINDINGS: Brain: No acute infarct, mass, midline shift, or extra-axial fluid collection is identified. A single punctate focus of susceptibility in the right occipital lobe is unchanged from the prior MRI and compatible with a chronic microhemorrhage, nonspecific in isolation. The brain is normal in signal on conventional sequences. Cerebral volume is normal. The ventricles are normal in size. Vascular: Major intracranial vascular flow voids are preserved. Skull and upper cervical spine: Unremarkable bone marrow signal. Sinuses/Orbits: Unremarkable orbits. Paranasal sinuses and mastoid air cells are clear. Other: None. IMPRESSION: Unremarkable brain MRI aside from a single chronic cerebral microhemorrhage. Electronically Signed   By: Sebastian Ache M.D.   On: 03/27/2024 17:57   CT HEAD CODE STROKE WO CONTRAST Result Date: 03/27/2024 CLINICAL DATA:  Code stroke. Neuro deficit, acute, stroke suspected. EXAM: CT HEAD WITHOUT CONTRAST TECHNIQUE: Contiguous axial images were obtained from the base of the skull through the vertex without intravenous contrast. RADIATION DOSE REDUCTION: This exam was performed according to the departmental dose-optimization program which includes automated exposure control, adjustment of the mA and/or kV according to patient size and/or use of iterative reconstruction technique. COMPARISON:  Head CT 08/05/2020 and MRI 04/30/2021 FINDINGS: Brain: There is no evidence of an acute infarct,  intracranial hemorrhage, mass, midline shift, or extra-axial fluid collection. Cerebral volume is normal. The ventricles are normal in size. Vascular: No hyperdense vessel. Skull: No acute fracture or suspicious lesion. Sinuses/Orbits: Small left frontal sinus osteoma. Clear mastoid air cells. Unremarkable orbits. Other: None. ASPECTS ([place] Stroke Program Early CT Score) - Ganglionic level infarction (caudate, lentiform nuclei, internal capsule, insula, M1-M3 cortex): 7 - Supraganglionic infarction (M4-M6 cortex): 3 Total score (0-10 with 10 being normal): 10 These results were communicated to Dr. Iver Nestle at 3:37 pm on 03/27/2024 by text page via the [facility] messaging system. IMPRESSION: Unremarkable CT appearance of the brain.  ASPECTS of 10. Electronically Signed   By: Sebastian Ache M.D.   On: 03/27/2024 15:37    Procedures Procedures  Medications Ordered in ED Medications  sodium chloride flush (NS) 0.9 % injection 3 mL (3 mLs Intravenous Given 03/27/24 1621)  prochlorperazine (COMPAZINE) injection 5 mg (5 mg Intravenous Given 03/27/24 1615)  diphenhydrAMINE (BENADRYL) injection 12.5 mg (12.5 mg Intravenous Given 03/27/24 1615)    ED Course/ Medical Decision Making/ A&P                                 Medical Decision Making Amount and/or Complexity of Data Reviewed Labs: ordered. Radiology: ordered.  Risk Prescription drug management.   63 yo F with a chief complaint of left-sided weakness feeling and dizziness.  She arrived as a code stroke.  Was taken urgently back to CT after her airway was cleared at the bridge.  She is complaining of a bit of a left-sided headache.  Will give a headache cocktail here.  Neurology also thinks that this could be a complicated migraine.  Has a history of the same.  Recommended MRI.  This has resulted and is negative.  Patient is feeling better after headache cocktail.  Neurology follow-up.  No significant electrolyte abnormalities.  No acute  anemia.  Glucose normal.  6:21 PM:  I have discussed the diagnosis/risks/treatment options with the patient and family.  Evaluation and diagnostic testing in the emergency department does not suggest an emergent condition requiring admission or immediate intervention beyond what has been performed at this time.  They will follow up with PCP, neuro. We also discussed returning to the ED immediately if new or worsening sx occur. We discussed the sx which are most concerning (e.g., sudden worsening pain, fever, inability to tolerate by mouth) that necessitate immediate return. Medications administered to the patient during their visit and any new prescriptions provided to the patient are listed below.  Medications given during this visit Medications  sodium chloride flush (NS) 0.9 % injection 3 mL (3 mLs Intravenous Given 03/27/24 1621)  prochlorperazine (COMPAZINE) injection 5 mg (5 mg Intravenous Given 03/27/24 1615)  diphenhydrAMINE (BENADRYL) injection 12.5 mg (12.5 mg Intravenous Given 03/27/24 1615)     The patient appears reasonably screen and/or stabilized for discharge and I doubt any other medical condition or other [facility] requiring further screening, evaluation, or treatment in the ED at this time prior to discharge.          Final Clinical Impression(s) / ED Diagnoses Final diagnoses:  Left-sided weakness    Rx / DC Orders ED Discharge Orders     None         Melene Plan, DO 03/27/24 1821

## 2024-04-14 DIAGNOSIS — I6523 Occlusion and stenosis of bilateral carotid arteries: Secondary | ICD-10-CM | POA: Diagnosis not present

## 2024-04-14 DIAGNOSIS — I6381 Other cerebral infarction due to occlusion or stenosis of small artery: Secondary | ICD-10-CM | POA: Diagnosis not present

## 2024-04-14 DIAGNOSIS — M542 Cervicalgia: Secondary | ICD-10-CM | POA: Diagnosis not present

## 2024-04-14 DIAGNOSIS — G43009 Migraine without aura, not intractable, without status migrainosus: Secondary | ICD-10-CM | POA: Diagnosis not present

## 2024-04-14 DIAGNOSIS — R200 Anesthesia of skin: Secondary | ICD-10-CM | POA: Diagnosis not present

## 2024-04-14 DIAGNOSIS — G250 Essential tremor: Secondary | ICD-10-CM | POA: Diagnosis not present

## 2024-04-14 DIAGNOSIS — I6529 Occlusion and stenosis of unspecified carotid artery: Secondary | ICD-10-CM | POA: Diagnosis not present

## 2024-04-14 DIAGNOSIS — R29898 Other symptoms and signs involving the musculoskeletal system: Secondary | ICD-10-CM | POA: Diagnosis not present

## 2024-04-14 DIAGNOSIS — R202 Paresthesia of skin: Secondary | ICD-10-CM | POA: Diagnosis not present

## 2024-04-19 ENCOUNTER — Other Ambulatory Visit: Payer: Self-pay | Admitting: Family Medicine

## 2024-04-19 DIAGNOSIS — E782 Mixed hyperlipidemia: Secondary | ICD-10-CM

## 2024-04-19 NOTE — Telephone Encounter (Signed)
 Requested medication (s) are due for refill today: yes  Requested medication (s) are on the active medication list: yes  Last refill:  03/17/23 #90 3 RF  Future visit scheduled: no  Notes to clinic:  abnormal lab work    Requested Prescriptions  Pending Prescriptions Disp Refills   ezetimibe  (ZETIA ) 10 MG tablet [Pharmacy Med Name: EZETIMIBE  10 MG TABLET] 90 tablet 3    Sig: TAKE 1 TABLET BY MOUTH EVERY DAY     Cardiovascular:  Antilipid - Sterol Transport Inhibitors Failed - 04/19/2024  4:25 PM      Failed - AST in normal range and within 360 days    AST  Date Value Ref Range Status  03/27/2024 68 (H) 15 - 41 U/L Final    Comment:    HEMOLYSIS AT THIS LEVEL MAY AFFECT RESULT   SGOT(AST)  Date Value Ref Range Status  05/03/2012 30 15 - 37 Unit/L Final         Failed - ALT in normal range and within 360 days    ALT  Date Value Ref Range Status  03/27/2024 57 (H) 0 - 44 U/L Final    Comment:    HEMOLYSIS AT THIS LEVEL MAY AFFECT RESULT   SGPT (ALT)  Date Value Ref Range Status  05/03/2012 25 U/L Final    Comment:    12-78 NOTE: NEW REFERENCE RANGE 11/22/2011          Failed - Valid encounter within last 12 months    Recent Outpatient Visits   None     Future Appointments             In 3 weeks Artemio Larry, MD [facility]            Failed - Lipid Panel in normal range within the last 12 months    Cholesterol  Date Value Ref Range Status  03/17/2023 181 <200 mg/dL Final   LDL Cholesterol (Calc)  Date Value Ref Range Status  03/17/2023 93 mg/dL (calc) Final    Comment:    Reference range: <100 . Desirable range <100 mg/dL for primary prevention;   <70 mg/dL for patients with CHD or diabetic patients  with > or = 2 CHD risk factors. Aaron Aas LDL-C is now calculated using the Martin-Hopkins  calculation, which is a validated novel method providing  better accuracy than the Friedewald equation in the  estimation of LDL-C.  Melinda Sprawls et al. Erroll Heard. 8295;621(30): 2061-2068  (http://education.[facility].com/faq/FAQ164)    HDL  Date Value Ref Range Status  03/17/2023 74 > OR = 50 mg/dL Final   Triglycerides  Date Value Ref Range Status  03/17/2023 58 <150 mg/dL Final         Passed - Patient is not pregnant

## 2024-04-23 ENCOUNTER — Other Ambulatory Visit: Payer: Self-pay

## 2024-04-23 ENCOUNTER — Encounter: Payer: Self-pay | Admitting: Family Medicine

## 2024-04-23 ENCOUNTER — Ambulatory Visit (INDEPENDENT_AMBULATORY_CARE_PROVIDER_SITE_OTHER): Admitting: Family Medicine

## 2024-04-23 VITALS — BP 124/82 | HR 77 | Temp 97.8°F | Resp 16 | Ht 59.0 in | Wt 127.6 lb

## 2024-04-23 DIAGNOSIS — I6523 Occlusion and stenosis of bilateral carotid arteries: Secondary | ICD-10-CM | POA: Diagnosis not present

## 2024-04-23 DIAGNOSIS — J329 Chronic sinusitis, unspecified: Secondary | ICD-10-CM | POA: Diagnosis not present

## 2024-04-23 DIAGNOSIS — R7303 Prediabetes: Secondary | ICD-10-CM | POA: Insufficient documentation

## 2024-04-23 DIAGNOSIS — J309 Allergic rhinitis, unspecified: Secondary | ICD-10-CM

## 2024-04-23 DIAGNOSIS — E782 Mixed hyperlipidemia: Secondary | ICD-10-CM

## 2024-04-23 DIAGNOSIS — R350 Frequency of micturition: Secondary | ICD-10-CM | POA: Diagnosis not present

## 2024-04-23 DIAGNOSIS — Z8673 Personal history of transient ischemic attack (TIA), and cerebral infarction without residual deficits: Secondary | ICD-10-CM

## 2024-04-23 DIAGNOSIS — Z5181 Encounter for therapeutic drug level monitoring: Secondary | ICD-10-CM

## 2024-04-23 DIAGNOSIS — Z09 Encounter for follow-up examination after completed treatment for conditions other than malignant neoplasm: Secondary | ICD-10-CM

## 2024-04-23 DIAGNOSIS — G43009 Migraine without aura, not intractable, without status migrainosus: Secondary | ICD-10-CM | POA: Diagnosis not present

## 2024-04-23 DIAGNOSIS — R748 Abnormal levels of other serum enzymes: Secondary | ICD-10-CM | POA: Diagnosis not present

## 2024-04-23 MED ORDER — AMOXICILLIN-POT CLAVULANATE 875-125 MG PO TABS: 1.0000 | ORAL_TABLET | Freq: Two times a day (BID) | ORAL | 0 refills | Status: AC

## 2024-04-23 MED ORDER — MOMETASONE FUROATE 50 MCG/ACT NA SUSP: 2.0000 | Freq: Every day | NASAL | 12 refills | Status: AC

## 2024-04-23 NOTE — Progress Notes (Signed)
 Name: Elizabeth Mata   MRN: [id number]    DOB: [birth year]   Date:04/23/2024       Progress Note  Chief Complaint  Patient presents with   Follow-up    ER at [facility] for symptoms of mini stroke.      Subjective:   Elizabeth Mata is a 63 y.o. female, presents to clinic for routine follow up on chronic conditions  She has a list of labs she wants checked - cholesterol kidney/liver function, sugars Hx of prediabetes but last labs were in normal range Lab Results  Component Value Date   HGBA1C 5.6 08/06/2021   HLD on statin and zetia   Sinus/rhinitis pain pressure congestion on allegra  and started mucinex-d w/o improvement, she has pain pressure HA, tenderness to face  She did not mention anything about ER visit or f/up for "ministroke"    Also she wanted kidneys checked due to urinary sx she reported - urgency, inability to go to the bathroom and then feeling like bladder if full after she urinates, no blood in urine or pain with urination    Current Outpatient Medications:    aspirin  EC 81 MG EC tablet, Take 1 tablet (81 mg total) by mouth daily., Disp: 30 tablet, Rfl: 1   atorvastatin  (LIPITOR) 80 MG tablet, Take 1 tablet (80 mg total) by mouth daily., Disp: 90 tablet, Rfl: 3   ezetimibe  (ZETIA ) 10 MG tablet, TAKE 1 TABLET BY MOUTH EVERY DAY, Disp: 90 tablet, Rfl: 1   ibuprofen  (ADVIL ) 200 MG tablet, Take 200 mg by mouth daily as needed for headache., Disp: , Rfl:    mometasone  (ELOCON ) 0.1 % cream, Apply topically to flares at body qd/bid prn for up to 2 weeks. Avoid applying to face, groin, and axilla. Use as directed., Disp: 45 g, Rfl: 3   topiramate  (TOPAMAX ) 200 MG tablet, Take 200 mg by mouth 2 (two) times daily. , Disp: , Rfl: 11  Patient Active Problem List   Diagnosis Date Noted   Vulvar cancer (HCC) 12/18/2022   Mixed hyperlipidemia 09/16/2022   Pelvic floor weakness in female 03/03/2020   Polyp of ascending colon    Abdominal pain, chronic, epigastric     Carotid atherosclerosis, bilateral 11/06/2017   Hx of completed stroke 10/28/2017   Skin pigmentation disorder 07/24/2016   Allergic rhinitis 06/30/2016   Eczema 09/13/2015   Irritable bowel syndrome with constipation 09/13/2015   Seizures (HCC) 08/16/2015   Bickerstaff's migraine 07/06/2012    Past Surgical History:  Procedure Laterality Date   ABDOMINAL HYSTERECTOMY  40s   endometriosis, fibroid tumors; no cancer, partial   BREAST EXCISIONAL BIOPSY Left 1980'S   EXCISIONAL - NEG   BREAST SURGERY     CHOLECYSTECTOMY  31   COLONOSCOPY WITH PROPOFOL  N/A 06/23/2019   Procedure: COLONOSCOPY WITH PROPOFOL ;  Surgeon: Irby Mannan, MD;  Location: [facility] ENDOSCOPY;  Service: Endoscopy;  Laterality: N/A;   COLONOSCOPY WITH PROPOFOL  N/A 12/28/2020   Procedure: COLONOSCOPY WITH PROPOFOL ;  Surgeon: Irby Mannan, MD;  Location: [facility] ENDOSCOPY;  Service: Endoscopy;  Laterality: N/A;   ESOPHAGOGASTRODUODENOSCOPY (EGD) WITH PROPOFOL  N/A 06/23/2019   Procedure: ESOPHAGOGASTRODUODENOSCOPY (EGD) WITH PROPOFOL ;  Surgeon: Irby Mannan, MD;  Location: [facility] ENDOSCOPY;  Service: Endoscopy;  Laterality: N/A;   ESOPHAGOGASTRODUODENOSCOPY (EGD) WITH PROPOFOL  N/A 12/28/2020   Procedure: ESOPHAGOGASTRODUODENOSCOPY (EGD) WITH PROPOFOL ;  Surgeon: Irby Mannan, MD;  Location: [facility] ENDOSCOPY;  Service: Endoscopy;  Laterality: N/A;   ESOPHAGOGASTRODUODENOSCOPY (EGD) WITH PROPOFOL  N/A 12/27/2022  Procedure: ESOPHAGOGASTRODUODENOSCOPY (EGD) WITH PROPOFOL ;  Surgeon: Luke Salaam, MD;  Location: [facility] ENDOSCOPY;  Service: Gastroenterology;  Laterality: N/A;   FOOT SURGERY Right    tendon release   VULVECTOMY PARTIAL N/A 01/01/2023   Procedure: EXAM UNDER ANESTHESIA,VULVECTOMY PARTIAL, COLPOSCOPY;  Surgeon: Nobie Batch, MD;  Location: [facility] ORS;  Service: Gynecology;  Laterality: N/A;    Family History  Problem Relation Age of Onset   Coronary artery disease Maternal Uncle     Diabetes Mellitus II Mother    Arthritis Mother    Diabetes Maternal Uncle    Kidney cancer Father 3       died 2017/05/10   Kidney disease Father    Cancer Father        kidney and bladder   Arthritis Maternal Uncle    Cancer Maternal Grandmother        stomach?   Jaundice Maternal Grandmother    Stomach cancer Paternal Grandmother    Cancer Paternal Grandmother        stomach   Hypertension Sister    Stroke Maternal Grandfather    Diabetes Paternal Grandfather    Diabetes Sister    Hypertension Sister    Breast cancer Neg Hx    Bladder Cancer Neg Hx     Social History   Tobacco Use   Smoking status: Never    Passive exposure: Never   Smokeless tobacco: Never  Vaping Use   Vaping status: Never Used  Substance Use Topics   Alcohol use: Yes    Comment: occasionally   Drug use: Never     Allergies  Allergen Reactions   Clindamycin /Lincomycin Hives   Flagyl  [Metronidazole ] Hives   Hydrocodone  Nausea Only   Sulfa Antibiotics Hives   Roxicodone  [Oxycodone ] Nausea And Vomiting    Health Maintenance  Topic Date Due   Zoster Vaccines- Shingrix (1 of 2) Never done   Cervical Cancer Screening (HPV/Pap Cotest)  10/08/2016   COVID-19 Vaccine (3 - Pfizer risk series) 05/17/2020   Medicare Annual Wellness (AWV)  06/05/2024   INFLUENZA VACCINE  07/30/2024   MAMMOGRAM  10/01/2025   Colonoscopy  12/28/2025   DTaP/Tdap/Td (2 - Td or Tdap) 10/28/2027   Hepatitis C Screening  Completed   HIV Screening  Completed   HPV VACCINES  Aged Out   Meningococcal B Vaccine  Aged Out    Chart Review Today: I personally reviewed active problem list, medication list, allergies, family history, social history, health maintenance, notes from last encounter, lab results, imaging with the patient/caregiver today.   Review of Systems  Constitutional: Negative.   HENT: Negative.    Eyes: Negative.   Respiratory: Negative.    Cardiovascular: Negative.   Gastrointestinal:  Negative.   Endocrine: Negative.   Genitourinary: Negative.   Musculoskeletal: Negative.   Skin: Negative.   Allergic/Immunologic: Negative.   Neurological: Negative.   Hematological: Negative.   Psychiatric/Behavioral: Negative.    All other systems reviewed and are negative.    Objective:   Vitals:   04/23/24 1405  BP: 124/82  Pulse: 77  Resp: 16  Temp: 97.8 F (36.6 C)  TempSrc: Oral  SpO2: 93%  Weight: 127 lb 9.6 oz (57.9 kg)  Height: 4\' 11"  (1.499 m)    Body mass index is 25.77 kg/m.  Physical Exam Vitals and nursing note reviewed.  Constitutional:      General: She is not in acute distress.    Appearance: Normal appearance. She is well-developed. She is not  ill-appearing, toxic-appearing or diaphoretic.  HENT:     Head: Normocephalic and atraumatic.     Right Ear: Tympanic membrane, ear canal and external ear normal. There is no impacted cerumen.     Left Ear: Tympanic membrane, ear canal and external ear normal. There is no impacted cerumen.     Nose: Congestion and rhinorrhea present.     Right Turbinates: Enlarged, swollen and pale.     Left Turbinates: Enlarged, swollen and pale.     Right Sinus: Maxillary sinus tenderness and frontal sinus tenderness present.     Left Sinus: Maxillary sinus tenderness and frontal sinus tenderness present.     Mouth/Throat:     Mouth: Mucous membranes are moist.     Pharynx: Oropharynx is clear. Posterior oropharyngeal erythema present. No oropharyngeal exudate.  Eyes:     General: No scleral icterus.       Right eye: No discharge.        Left eye: No discharge.     Conjunctiva/sclera: Conjunctivae normal.  Neck:     Trachea: No tracheal deviation.  Cardiovascular:     Rate and Rhythm: Normal rate and regular rhythm.     Pulses: Normal pulses.     Heart sounds: Normal heart sounds.  Pulmonary:     Effort: Pulmonary effort is normal. No respiratory distress.     Breath sounds: Normal breath sounds. No stridor. No  wheezing, rhonchi or rales.  Abdominal:     General: Bowel sounds are normal.     Palpations: Abdomen is soft.  Skin:    General: Skin is warm and dry.     Findings: No rash.  Neurological:     Mental Status: She is alert.     Motor: No abnormal muscle tone.     Coordination: Coordination normal.  Psychiatric:        Behavior: Behavior normal.      Functional Status Survey: Is the patient deaf or have difficulty hearing?: No Does the patient have difficulty seeing, even when wearing glasses/contacts?: No Does the patient have difficulty concentrating, remembering, or making decisions?: No Does the patient have difficulty walking or climbing stairs?: No Does the patient have difficulty dressing or bathing?: No Does the patient have difficulty doing errands alone such as visiting a doctor's office or shopping?: No Results for orders placed or performed during the hospital encounter of 03/27/24  CBG monitoring, ED   Collection Time: 03/27/24  3:21 PM  Result Value Ref Range   Glucose-Capillary 82 70 - 99 mg/dL  Protime-INR   Collection Time: 03/27/24  3:25 PM  Result Value Ref Range   Prothrombin Time 13.5 11.4 - 15.2 seconds   INR 1.0 0.8 - 1.2  APTT   Collection Time: 03/27/24  3:25 PM  Result Value Ref Range   aPTT 26 24 - 36 seconds  CBC   Collection Time: 03/27/24  3:25 PM  Result Value Ref Range   WBC 5.2 4.0 - 10.5 K/uL   RBC 4.32 3.87 - 5.11 MIL/uL   Hemoglobin 13.7 12.0 - 15.0 g/dL   HCT 16.1 09.6 - 04.5 %   MCV 94.2 80.0 - 100.0 fL   MCH 31.7 26.0 - 34.0 pg   MCHC 33.7 30.0 - 36.0 g/dL   RDW 40.9 81.1 - 91.4 %   Platelets 237 150 - 400 K/uL   nRBC 0.0 0.0 - 0.2 %  Differential   Collection Time: 03/27/24  3:25 PM  Result Value Ref Range  Neutrophils Relative % 38 %   Neutro Abs 2.0 1.7 - 7.7 K/uL   Lymphocytes Relative 53 %   Lymphs Abs 2.8 0.7 - 4.0 K/uL   Monocytes Relative 7 %   Monocytes Absolute 0.3 0.1 - 1.0 K/uL   Eosinophils Relative 1 %    Eosinophils Absolute 0.0 0.0 - 0.5 K/uL   Basophils Relative 1 %   Basophils Absolute 0.0 0.0 - 0.1 K/uL   Immature Granulocytes 0 %   Abs Immature Granulocytes 0.01 0.00 - 0.07 K/uL  Comprehensive metabolic panel   Collection Time: 03/27/24  3:25 PM  Result Value Ref Range   Sodium 139 135 - 145 mmol/L   Potassium 4.1 3.5 - 5.1 mmol/L   Chloride 114 (H) 98 - 111 mmol/L   CO2 17 (L) 22 - 32 mmol/L   Glucose, Bld 94 70 - 99 mg/dL   BUN 7 (L) 8 - 23 mg/dL   Creatinine, Ser 6.04 0.44 - 1.00 mg/dL   Calcium  8.8 (L) 8.9 - 10.3 mg/dL   Total Protein 5.9 (L) 6.5 - 8.1 g/dL   Albumin 3.5 3.5 - 5.0 g/dL   AST 68 (H) 15 - 41 U/L   ALT 57 (H) 0 - 44 U/L   Alkaline Phosphatase 70 38 - 126 U/L   Total Bilirubin 1.1 0.0 - 1.2 mg/dL   GFR, Estimated >54 >09 mL/min   Anion gap 8 5 - 15  Ethanol   Collection Time: 03/27/24  3:25 PM  Result Value Ref Range   Alcohol, Ethyl (B) <10 <10 mg/dL  I-stat chem 8, ED   Collection Time: 03/27/24  3:28 PM  Result Value Ref Range   Sodium 143 135 - 145 mmol/L   Potassium 3.2 (L) 3.5 - 5.1 mmol/L   Chloride 112 (H) 98 - 111 mmol/L   BUN 6 (L) 8 - 23 mg/dL   Creatinine, Ser 8.11 0.44 - 1.00 mg/dL   Glucose, Bld 85 70 - 99 mg/dL   Calcium , Ion 1.17 1.15 - 1.40 mmol/L   TCO2 18 (L) 22 - 32 mmol/L   Hemoglobin 13.6 12.0 - 15.0 g/dL   HCT 91.4 78.2 - 95.6 %      Assessment & Plan:     ICD-10-CM   1. Mixed hyperlipidemia  E78.2 Lipid panel   on statin and zetia , recheck lipids, tolerating meds, no SE or concers    2. Medication monitoring encounter  Z51.81 Comprehensive metabolic panel with GFR    Lipid panel    3. Prediabetes  R73.03 Comprehensive metabolic panel with GFR    Hemoglobin A1c   recheck A1c    4. Elevated liver enzymes  R74.8 Comprehensive metabolic panel with GFR   recheck CMP    5. Allergic rhinitis, unspecified seasonality, unspecified trigger  J30.9    not well controlled, add or change antihistamine and start steroid nose  spray    6. Carotid atherosclerosis, bilateral  I65.23    on statin, imaging f/up ordered by neurology    7. Rhinosinusitis  J32.9 amoxicillin -clavulanate (AUGMENTIN ) 875-125 MG tablet   pain pressure and worse sx for more than 3 weeks, encouraged allergy  management with more meds as above, tx with abx, f/up if not improving    8. Urinary frequency  R35.0 Ambulatory referral to Urogynecology   multiple urinary sx - trouble going to the bathroom, feels like she never empties bladder - she wanted kidney testing, recommended urogyn consult/bladder eval    9. Encounter for examination  following treatment at hospital  Z09    see below - ED record and results and specialists f/up reviewed today    10. Migraine without aura and without status migrainosus, not intractable  G43.009    managed by neurology, complex migraines, on topiramate , OTC NSAIDs and nurtec    11. History of stroke  Z86.73    TIA; with MRI and MRA of the brain showed remote small infarcts in the right thalamus and basal ganglia and patent vasculature.     ED f/up? TIA vs complex migraine - she did f/up with her neurologist at [facility] - note reviewed Interval History:  She returns today for follow-up after 1 year.   Patient is here for follow-up, but has new issues she wishes to discuss. On March 29 patient went to the emergency room locally due to left-sided weakness and dizziness. She was also speaking with her daughter prior to reporting to the ER and had issues getting the words out that she wanted to say, describes it as "babbling".  She did go to the emergency room and was consulted by neurology felt to be having a complicated migraine. She did seem to improve after a migraine cocktail.  Patient does have a history of a stroke, and questionable TIA events. Has had a carotid ultrasound in the past that was considered abnormal.   Primary concern for patient today is that ever since that episode she is having continuously  numbing and tingling sensation in her left upper extremity. Denies any significant weakness, but she does feel like she is weak as a whole.   Since last visit she is continues topiramate  200 mg twice daily for migraines. Has since discontinued gabapentin . For abortive relief patient often takes over-the-counter NSAIDs and/or Nurtec. Previously used [place] with benefit but insurance would not cover, Nurtec works just as well.   Patient does have a history of TIA; with MRI and MRA of the brain showed remote small infarcts in the right thalamus and basal ganglia and patent vasculature.   She does continue to endorse a mild resting tremor in her left hand and on occasion with her right upper lip. Patient reports both are intermittent. Do not affect her day-to-day. Has not noticed any specific triggers which make the tremors worse. Patient denies having issues with grip or dropping objects.  There is a questionable history of seizures in the past, patient denies any type of seizure activity in the past 10 years.   A&P per neurology: Diagnoses and all orders for this visit:  Migraine without aura and without status migrainosus, not intractable - CARD holter monitoring from 3 to 7 days - hook up; Future - MRI brain with and without contrast; Future  Lacunar stroke (CMS/HHS-HCC) - CARD holter monitoring from 3 to 7 days - hook up; Future - MRI brain with and without contrast; Future  Carotid atherosclerosis, bilateral - US  carotid bilateral; Future  Occlusion and stenosis of unspecified carotid artery - US  carotid bilateral; Future  Numbness and tingling in left arm - CARD holter monitoring from 3 to 7 days - hook up; Future - US  carotid bilateral; Future - MRI brain with and without contrast; Future - Ambulatory Referral to Physical Therapy  Cervicalgia - Ambulatory Referral to Physical Therapy  Bilateral arm weakness - Ambulatory Referral to Physical Therapy  Essential  tremor  Follow Up:  Return to Clinic in 4 months or sooner if needed   I spent a total of 45 minutes in both face-to-face  and non-face-to-face activities for this visit on the date of this encounter.  I personally performed the service, non-incident to. (WP)   Pooja Parmar, PA-C    Spent more than 30 min with patient today, more than 15 min with chart review of hospital and specialists records and more tahn addition 15 to complete chart documentation and orders today for level 5 time based billing   Return in about 6 months (around 10/23/2024).   Adeline Hone, PA-C 04/23/24 2:29 PM

## 2024-04-24 LAB — COMPREHENSIVE METABOLIC PANEL WITH GFR
AG Ratio: 1.9 (calc) (ref 1.0–2.5)
ALT: 31 U/L — ABNORMAL HIGH (ref 6–29)
AST: 29 U/L (ref 10–35)
Albumin: 4.4 g/dL (ref 3.6–5.1)
Alkaline phosphatase (APISO): 91 U/L (ref 37–153)
BUN: 11 mg/dL (ref 7–25)
CO2: 22 mmol/L (ref 20–32)
Calcium: 9.9 mg/dL (ref 8.6–10.4)
Chloride: 111 mmol/L — ABNORMAL HIGH (ref 98–110)
Creat: 0.77 mg/dL (ref 0.50–1.05)
Globulin: 2.3 g/dL (ref 1.9–3.7)
Glucose, Bld: 84 mg/dL (ref 65–99)
Potassium: 4.2 mmol/L (ref 3.5–5.3)
Sodium: 142 mmol/L (ref 135–146)
Total Bilirubin: 0.7 mg/dL (ref 0.2–1.2)
Total Protein: 6.7 g/dL (ref 6.1–8.1)
eGFR: 88 mL/min/{1.73_m2} (ref >=60)

## 2024-04-24 LAB — HEMOGLOBIN A1C
Hgb A1c MFr Bld: 6 % — ABNORMAL HIGH (ref <5.7)
Mean Plasma Glucose: 126 mg/dL
eAG (mmol/L): 7 mmol/L

## 2024-04-24 LAB — LIPID PANEL
Cholesterol: 167 mg/dL (ref <200)
HDL: 52 mg/dL (ref >=50)
LDL Cholesterol (Calc): 100 mg/dL — ABNORMAL HIGH
Non-HDL Cholesterol (Calc): 115 mg/dL (ref <130)
Total CHOL/HDL Ratio: 3.2 (calc) (ref <5.0)
Triglycerides: 65 mg/dL (ref <150)

## 2024-04-26 ENCOUNTER — Encounter: Payer: Self-pay | Admitting: Family Medicine

## 2024-04-30 ENCOUNTER — Ambulatory Visit: Payer: Self-pay

## 2024-04-30 NOTE — Telephone Encounter (Signed)
  Chief Complaint: lab results Additional Notes: Pt calling re: lab results from 04/23/24. Triager relayed message from [place], [place]. Pt has additional questions re: cholesterol medications. Pt is currently on 2 cholesterol meds and would like to know if Elizabeth Mata recommendation of Crestor would allow her to only take 1 cholesterol medication instead. Triager will forward encounter for Elizabeth Mata 's office to review and advise. Patient verbalized understanding and is expecting call back from office for next steps.    Copied from CRM [id number]. Topic: Clinical - Lab/Test Results >> Apr 30, 2024  9:28 AM Elizabeth Mata wrote: Reason for CRM: Patient would like to go over recent labs Reason for Disposition  [1] Caller requesting NON-URGENT health information AND [2] PCP's office is the best resource  Answer Assessment - Initial Assessment Questions 1. REASON FOR CALL or QUESTION: "What is your reason for calling today?" or "How can I best help you?" or "What question do you have that I can help answer?"     Triager reviewed labs on 04/23/2024: I am writing to let you know that I have reviewed your results:   Your bad cholesterol is not well controlled and it did increase a little bit.  You look like you have been on atorvastatin  80 mg since 2019 - so I recommend trying a different medicine to see if it works better for you to get LDL lower and at goal.  Suggest trying crestor 40.   Liver enzyme are looking better A1c is still in prediabetic range and is 6.0    Please send me back a mychart message if you are willing to try a different statin.  If you change meds I would want to recheck your cholesterol again in about 3 months.  Pt has additional questions re: recommended medication (Crestor) and states that she takes 2 cholesterol medications. She reports taking atorvastatin  (LIPITOR) 80 MG tablet in the AM, and ezetimibe  (ZETIA ) 10 MG tablet in the PM. Pt would like clarification on her cholesterol medication  management.  Protocols used: Information Only Call - No Triage-A-AH

## 2024-04-30 NOTE — Telephone Encounter (Signed)
**Note De-identified  [place] Obfuscation** Please advise 

## 2024-05-03 NOTE — Telephone Encounter (Signed)
Called patient and made aware. Patient verbalized understanding.

## 2024-05-11 ENCOUNTER — Ambulatory Visit: Payer: Medicare HMO | Admitting: Dermatology

## 2024-05-12 ENCOUNTER — Ambulatory Visit: Admitting: Dermatology

## 2024-05-21 ENCOUNTER — Other Ambulatory Visit: Payer: Self-pay | Admitting: Family Medicine

## 2024-05-21 DIAGNOSIS — E782 Mixed hyperlipidemia: Secondary | ICD-10-CM

## 2024-05-21 DIAGNOSIS — I6523 Occlusion and stenosis of bilateral carotid arteries: Secondary | ICD-10-CM

## 2024-05-25 NOTE — Telephone Encounter (Signed)
 Requested Prescriptions  Pending Prescriptions Disp Refills   atorvastatin  (LIPITOR) 80 MG tablet [Pharmacy Med Name: ATORVASTATIN  80 MG TABLET] 90 tablet 1    Sig: TAKE 1 TABLET BY MOUTH EVERY DAY     Cardiovascular:  Antilipid - Statins Failed - 05/25/2024  8:59 AM      Failed - Valid encounter within last 12 months    Recent Outpatient Visits           1 month ago Mixed hyperlipidemia   [facility] Adeline Hone, PA-C       Future Appointments             In 5 months Tapia, Leisa, PA-C  [facility]            Failed - Lipid Panel in normal range within the last 12 months    Cholesterol  Date Value Ref Range Status  04/23/2024 167 <200 mg/dL Final   LDL Cholesterol (Calc)  Date Value Ref Range Status  04/23/2024 100 (H) mg/dL (calc) Final    Comment:    Reference range: <100 . Desirable range <100 mg/dL for primary prevention;   <70 mg/dL for patients with CHD or diabetic patients  with > or = 2 CHD risk factors. Aaron Aas LDL-C is now calculated using the Martin-Hopkins  calculation, which is a validated novel method providing  better accuracy than the Friedewald equation in the  estimation of LDL-C.  Melinda Sprawls et al. Erroll Heard. 6045;409(81): 2061-2068  (http://education.[facility].com/faq/FAQ164)    HDL  Date Value Ref Range Status  04/23/2024 52 > OR = 50 mg/dL Final   Triglycerides  Date Value Ref Range Status  04/23/2024 65 <150 mg/dL Final         Passed - Patient is not pregnant

## 2024-05-27 DIAGNOSIS — I6523 Occlusion and stenosis of bilateral carotid arteries: Secondary | ICD-10-CM | POA: Diagnosis not present

## 2024-05-27 DIAGNOSIS — G43109 Migraine with aura, not intractable, without status migrainosus: Secondary | ICD-10-CM | POA: Diagnosis not present

## 2024-05-27 DIAGNOSIS — G43009 Migraine without aura, not intractable, without status migrainosus: Secondary | ICD-10-CM | POA: Diagnosis not present

## 2024-05-27 DIAGNOSIS — I6381 Other cerebral infarction due to occlusion or stenosis of small artery: Secondary | ICD-10-CM | POA: Diagnosis not present

## 2024-05-27 DIAGNOSIS — Z8673 Personal history of transient ischemic attack (TIA), and cerebral infarction without residual deficits: Secondary | ICD-10-CM | POA: Diagnosis not present

## 2024-05-27 DIAGNOSIS — R200 Anesthesia of skin: Secondary | ICD-10-CM | POA: Diagnosis not present

## 2024-05-27 DIAGNOSIS — R202 Paresthesia of skin: Secondary | ICD-10-CM | POA: Diagnosis not present

## 2024-05-27 DIAGNOSIS — I6529 Occlusion and stenosis of unspecified carotid artery: Secondary | ICD-10-CM | POA: Diagnosis not present

## 2024-05-27 DIAGNOSIS — G459 Transient cerebral ischemic attack, unspecified: Secondary | ICD-10-CM | POA: Diagnosis not present

## 2024-06-08 DIAGNOSIS — R200 Anesthesia of skin: Secondary | ICD-10-CM | POA: Diagnosis not present

## 2024-06-08 DIAGNOSIS — I6381 Other cerebral infarction due to occlusion or stenosis of small artery: Secondary | ICD-10-CM | POA: Diagnosis not present

## 2024-06-08 DIAGNOSIS — G43009 Migraine without aura, not intractable, without status migrainosus: Secondary | ICD-10-CM | POA: Diagnosis not present

## 2024-06-08 DIAGNOSIS — R202 Paresthesia of skin: Secondary | ICD-10-CM | POA: Diagnosis not present

## 2024-06-30 ENCOUNTER — Ambulatory Visit: Admitting: Dermatology

## 2024-07-21 ENCOUNTER — Inpatient Hospital Stay: Attending: Obstetrics and Gynecology | Admitting: Obstetrics and Gynecology

## 2024-07-21 ENCOUNTER — Encounter: Payer: Self-pay | Admitting: Obstetrics and Gynecology

## 2024-07-21 VITALS — BP 109/79 | HR 77 | Temp 98.6°F | Resp 20 | Wt 136.8 lb

## 2024-07-21 DIAGNOSIS — Z90710 Acquired absence of both cervix and uterus: Secondary | ICD-10-CM | POA: Insufficient documentation

## 2024-07-21 DIAGNOSIS — Z8544 Personal history of malignant neoplasm of other female genital organs: Secondary | ICD-10-CM | POA: Diagnosis not present

## 2024-07-21 DIAGNOSIS — N951 Menopausal and female climacteric states: Secondary | ICD-10-CM | POA: Diagnosis not present

## 2024-07-21 DIAGNOSIS — C519 Malignant neoplasm of vulva, unspecified: Secondary | ICD-10-CM

## 2024-07-21 MED ORDER — VENLAFAXINE HCL ER 37.5 MG PO CP24: 37.5000 mg | ORAL_CAPSULE | Freq: Every day | ORAL | 1 refills | Status: DC

## 2024-07-21 NOTE — Progress Notes (Signed)
 Gynecologic Oncology Interval Visit   Referring Provider: [name], PA   Chief Concern: SCC of vulva  Subjective:  Elizabeth Mata is a 63 y.o. female who is seen in consultation from [place], [place] [facility]) for newly diagnosed squamous cell carcinoma of the vulva, s/p WLE on 01/01/23.  She presents today for follow up vulvar cancer surveillance.  She is occasionally sexually active without pain.  She has hot flashes that are fairly significant and keep her from sleeping well.   Gynecologic Oncology History:  63 y.o. G2P2 whose LMP was No LMP recorded. Patient has had a hysterectomy., presented for enlarging labial lesion. She had noticed what she thought was ingrown hair for several months which then became painful and enlarging. She attempted home I&D then sought evaluation with gynecology.     12/03/2022 biopsy performed - Specimen was 0.3 x 0.3 x 0.2 cm fragment  PATHOLOGY: A. LABIA, RIGHT LESION, BIOPSY: - Moderately differentiated squamous cell carcinoma - Carcinoma is present at biopsy edge  11/10/2022 CT A/P IMPRESSION: No acute findings in the abdomen or pelvis.   HIV nonreactive 08/09/2019  No gross lesion identified on initial exam with Dr. Elby.   01/01/23- EUA, colposcopy, and WLE of vulva. Lesion visualized during colposcopy. Defect was approximately 3 x 1.5 cm. Stitch placed at 12:00.   Pathology:  A. VULVA, RIGHT; BIOPSY:  - DERMAL FIBROSIS, CONSISTENT WITH PREVIOUS BIOPSY SITE.  - NO RESIDUAL SQUAMOUS CELL CARCINOMA IDENTIFIED (REF OUTSIDE PATHOLOGY  REPORT; [phone]).   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    01/2023 Postop visit. Incision noted to be well healed. Recommended observation and review of pathology at [facility].   07/2023 At her request we asked Pathology to review her vulvar biopsy. Dr. [name] has reviewed 12/03/2022 biopsy and confirmed pathology  consistent with malignancy.   [name] reviewed recommendations with dermatology and they recommended a 3 month treatment of twice a day Serica for [place].   09/03/2023 Vulvar biopsies 1. Vulva, biopsy, Right lower labia: - SQUAMOUS MUCOSA WITH FIBROSIS, MINIMAL CHRONIC INFLAMMATION AND HYPERGRANULOSIS. NO DYSPLASIA OR MALIGNANCY.  2. Vulva, biopsy, Right middle labia - SQUAMOUS MUCOSA WITH FIBROSIS, MINIMAL CHRONIC INFLAMMATION AND HYPERGRANULOSIS. NO DYSPLASIA OR MALIGNANCY. - SEE COMMENT.  3. Vulva, biopsy, Right upper labia - SQUAMOUS MUCOSA WITH FIBROSIS, MINIMAL CHRONIC INFLAMMATION AND HYPERGRANULOSIS. NO DYSPLASIA OR MALIGNANCY.  Microscopic Comment 1. -3. COMMENT: The findings are non specific and appear reactive.  Problem List: Patient Active Problem List   Diagnosis Date Noted   Prediabetes 04/23/2024   Elevated liver enzymes 04/23/2024   Migraine without aura and without status migrainosus, not intractable 04/23/2024   Vulvar cancer (HCC) 12/18/2022   Mixed hyperlipidemia 09/16/2022   Pelvic floor weakness in female 03/03/2020   Polyp of ascending colon    Abdominal pain, chronic, epigastric    Carotid atherosclerosis, bilateral 11/06/2017   History of stroke 10/28/2017   Skin pigmentation disorder 07/24/2016   Allergic rhinitis 06/30/2016   Eczema 09/13/2015   Irritable bowel syndrome with constipation 09/13/2015   Bickerstaff's migraine 07/06/2012    Past Medical History: Past Medical History:  Diagnosis Date   Allergic rhinitis 06/30/2016   Allergy     Carotid atherosclerosis, bilateral 11/06/2017   US  Nov 2018   Cyst of breast, left, solitary    Flank pain    GERD (gastroesophageal reflux disease)    Hemorrhoid    Hemorrhoids    Hyperlipidemia  IFG (impaired fasting glucose) 07/18/2016   June 2017    Kidney stone on left side 10/24/2015   Kidney stones    Neuropathy 07/24/2016   Seizures (HCC)    Shingles outbreak 02/03/2017   Stomach irritation     Syncope    Recurrent over the last 2 years. had workup at [facility] this year with cardiology and neurology. Had echo and 3-week event monitor that were unremarkable. Has had negative EEG. Echo in 11/11 at [facility] showed no significant abnormalities. Possibly vasovagal   TIA (transient ischemic attack) 10/28/2017   Trigger thumb     Past Surgical History: Past Surgical History:  Procedure Laterality Date   ABDOMINAL HYSTERECTOMY  40s   endometriosis, fibroid tumors; no cancer, partial   BREAST EXCISIONAL BIOPSY Left 1980'S   EXCISIONAL - NEG   BREAST SURGERY     CHOLECYSTECTOMY  31   COLONOSCOPY WITH PROPOFOL  N/A 06/23/2019   Procedure: COLONOSCOPY WITH PROPOFOL ;  Surgeon: Janalyn Keene NOVAK, MD;  Location: [facility] ENDOSCOPY;  Service: Endoscopy;  Laterality: N/A;   COLONOSCOPY WITH PROPOFOL  N/A 12/28/2020   Procedure: COLONOSCOPY WITH PROPOFOL ;  Surgeon: Janalyn Keene NOVAK, MD;  Location: [facility] ENDOSCOPY;  Service: Endoscopy;  Laterality: N/A;   ESOPHAGOGASTRODUODENOSCOPY (EGD) WITH PROPOFOL  N/A 06/23/2019   Procedure: ESOPHAGOGASTRODUODENOSCOPY (EGD) WITH PROPOFOL ;  Surgeon: Janalyn Keene NOVAK, MD;  Location: [facility] ENDOSCOPY;  Service: Endoscopy;  Laterality: N/A;   ESOPHAGOGASTRODUODENOSCOPY (EGD) WITH PROPOFOL  N/A 12/28/2020   Procedure: ESOPHAGOGASTRODUODENOSCOPY (EGD) WITH PROPOFOL ;  Surgeon: Janalyn Keene NOVAK, MD;  Location: [facility] ENDOSCOPY;  Service: Endoscopy;  Laterality: N/A;   ESOPHAGOGASTRODUODENOSCOPY (EGD) WITH PROPOFOL  N/A 12/27/2022   Procedure: ESOPHAGOGASTRODUODENOSCOPY (EGD) WITH PROPOFOL ;  Surgeon: Therisa Bi, MD;  Location: [facility] ENDOSCOPY;  Service: Gastroenterology;  Laterality: N/A;   FOOT SURGERY Right    tendon release   VULVECTOMY PARTIAL N/A 01/01/2023   Procedure: EXAM UNDER ANESTHESIA,VULVECTOMY PARTIAL, COLPOSCOPY;  Surgeon: Elby Webb Loges, MD;  Location: [facility] ORS;  Service: Gynecology;  Laterality: N/A;    Past Gynecologic History:  As per  HPI  OB History:  OB History  Gravida Para Term Preterm AB Living  2 2    2   SAB IAB Ectopic Multiple Live Births          # Outcome Date GA Lbr Len/2nd Weight Sex Type Anes PTL Lv  2 Para           1 Para             Obstetric Comments  Menstrual age: 89    Age 1st Pregnancy: 58    Family History: Family History  Problem Relation Age of Onset   Coronary artery disease Maternal Uncle    Diabetes Mellitus II Mother    Arthritis Mother    Diabetes Maternal Uncle    Kidney cancer Father 30       died 04/25/17   Kidney disease Father    Cancer Father        kidney and bladder   Arthritis Maternal Uncle    Cancer Maternal Grandmother        stomach?   Jaundice Maternal Grandmother    Stomach cancer Paternal Grandmother    Cancer Paternal Grandmother        stomach   Hypertension Sister    Stroke Maternal Grandfather    Diabetes Paternal Grandfather    Diabetes Sister    Hypertension Sister    Breast cancer Neg Hx    Bladder Cancer Neg Hx  Social History: Social History   Socioeconomic History   Marital status: Legally Separated    Spouse name: Not on file   Number of children: 2   Years of education: Not on file   Highest education level: GED or equivalent  Occupational History   Occupation: unemployed  Tobacco Use   Smoking status: Never    Passive exposure: Never   Smokeless tobacco: Never  Vaping Use   Vaping status: Never Used  Substance and Sexual Activity   Alcohol use: Yes    Comment: occasionally   Drug use: Never   Sexual activity: Yes    Partners: Male    Birth control/protection: Surgical    Comment: Hysterectomy  Other Topics Concern   Not on file  Social History Narrative   Regular exercise   Social Drivers of Health   Financial Resource Strain: Low Risk  (06/06/2023)   Overall Financial Resource Strain (CARDIA)    Difficulty of Paying Living Expenses: Not hard at all  Food Insecurity: No Food Insecurity (06/06/2023)    Hunger Vital Sign    Worried About Running Out of Food in the Last Year: Never true    Ran Out of Food in the Last Year: Never true  Transportation Needs: No Transportation Needs (06/06/2023)   PRAPARE - Administrator, Civil Service (Medical): No    Lack of Transportation (Non-Medical): No  Physical Activity: Sufficiently Active (06/06/2023)   Exercise Vital Sign    Days of Exercise per Week: 7 days    Minutes of Exercise per Session: 30 min  Stress: No Stress Concern Present (06/06/2023)   [employer] of Occupational Health - Occupational Stress Questionnaire    Feeling of Stress : Not at all  Social Connections: Moderately Isolated (06/06/2023)   Social Connection and Isolation Panel    Frequency of Communication with Friends and Family: More than three times a week    Frequency of Social Gatherings with Friends and Family: Three times a week    Attends Religious Services: More than 4 times per year    Active Member of Clubs or Organizations: No    Attends Banker Meetings: Never    Marital Status: Separated  Intimate Partner Violence: Not At Risk (06/06/2023)   Humiliation, Afraid, Rape, and Kick questionnaire    Fear of Current or Ex-Partner: No    Emotionally Abused: No    Physically Abused: No    Sexually Abused: No    Allergies: Allergies  Allergen Reactions   Clindamycin /Lincomycin Hives   Flagyl  [Metronidazole ] Hives   Hydrocodone  Nausea Only   Sulfa Antibiotics Hives   Roxicodone  [Oxycodone ] Nausea And Vomiting    Current Medications: Current Outpatient Medications  Medication Sig Dispense Refill   aspirin  EC 81 MG EC tablet Take 1 tablet (81 mg total) by mouth daily. 30 tablet 1   atorvastatin  (LIPITOR) 80 MG tablet TAKE 1 TABLET BY MOUTH EVERY DAY 90 tablet 1   ezetimibe  (ZETIA ) 10 MG tablet TAKE 1 TABLET BY MOUTH EVERY DAY 90 tablet 1   fexofenadine  (ALLEGRA ) 180 MG tablet Take 180 mg by mouth daily.     ibuprofen  (ADVIL ) 200 MG tablet  Take 200 mg by mouth daily as needed for headache.     mometasone  (ELOCON ) 0.1 % cream Apply topically to flares at body qd/bid prn for up to 2 weeks. Avoid applying to face, groin, and axilla. Use as directed. 45 g 3   mometasone  (NASONEX ) 50 MCG/ACT nasal spray  Place 2 sprays into the nose daily. 1 each 12   topiramate  (TOPAMAX ) 200 MG tablet Take 200 mg by mouth 2 (two) times daily.   11   No current facility-administered medications for this visit.    Review of Systems General: no complaints  HEENT: no complaints  Lungs: no complaints, chronic complaint  Cardiac: no complaints  GI: no complaints        Musculoskeletal: no complaints  Extremities: no complaints  Skin: yes as noted in GU  Neuro: no complaints  Endocrine: no complaints  Psych: no complaints     Objective:  Physical Examination:  BP 109/79   Pulse 77   Temp 98.6 F (37 C)   Resp 20   Wt 136 lb 12.8 oz (62.1 kg)   SpO2 100%   BMI 27.63 kg/m    GENERAL: Patient is a well appearing female in no acute distress HEENT:  Atraumatic normocephalic ABDOMEN:  Soft, nontender, nondistended. No masses or ascites.   EXTREMITIES:  No peripheral edema.   NEURO:  Nonfocal. Well oriented.  Appropriate affect.  Pelvic: chaperoned by CMA EGBUS: healed incision on the right vulva. 2-3 cm in length the keloid is not as prominent. 1 cm firmness which is improved.  Vagina: there are two visible paravaginal cystic lesions ~ 8 mm in size.  Uterus: surgically absent.  BME: palpable multiple paravaginal cystic lesions ~ 8 mm in size along the right vaginal sidewall and vaginal apex  Lab Review Per hpi  Radiologic Imaging: Per HPI     Assessment:  AIRICA SCHWARTZKOPF is a 63 y.o. 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 Biopsy of small lesion 12/23 showed squamous cell cancer.  Entire biopsy was only 3 mm, so depth of invasion unclear.  01/01/23- EUA, colposcopy, and  WLE of vulva. No residual dysplasia or cancer in the pathology specimen. 07/2023 concern for keloid formation, biopsies all negative. Stopped topical Serica on vulva because did not feel she needed it anymore. No new complaints and NED on exam.   H/o hypercalcemia  She has hot flashes that are fairly significant and keep her from sleeping well.   Medical co-morbidities complicating care: h/o Carotid atherosclerosis, bilateral with TIA 2018; Hyperlipidemia and h/o seizures on therapy Plan:   Problem List Items Addressed This Visit       Genitourinary   Vulvar cancer (HCC) - Primary    We will treat her hot flashes with Effexor  XR 37.5 mg at bedtime. She will follow up with [name] NP in one month to assess response and possibly increase dose if needed.   Continue surveillance with RTC in 6 months.     The patient's diagnosis, an outline of the further diagnostic and laboratory studies which will be required, the recommendation, and alternatives were discussed.  All questions were answered to the patient's satisfaction.  I personally interviewed and examined the patient. Agreed with the above/below plan of care. I have directly contributed to assessment and plan of care of this patient and educated and discussed with patient and family.   Prentice Agent, MD

## 2024-07-26 ENCOUNTER — Telehealth: Payer: Self-pay | Admitting: *Deleted

## 2024-07-26 NOTE — Telephone Encounter (Signed)
 The patient said that that Dr. Mustoe gave her the wrong medicine because venlafaxine  and it is for depression.  He wanted to know know why he was sending her that medicine.  I told her that it is a second use with that medicine that helps people with hot flashes during menopause to help it so it is less of the symptoms.  She says that she will look at all the information and she does not want to take it.  I told her that we could get Lauren and ask Tinnie can give her another medicine .  For the menopause.  She says at this point she just wants to find something over-the-counter and go from there.  I told her that Tinnie would be glad to help you and she says that she is already made her mind and she does not want to waste her time when she already made up her mind she is going to find stuck over-the-counter. She wanted to d/c the appt with Lauren.

## 2024-08-11 ENCOUNTER — Ambulatory Visit: Payer: Medicare HMO

## 2024-08-16 DIAGNOSIS — G43009 Migraine without aura, not intractable, without status migrainosus: Secondary | ICD-10-CM | POA: Diagnosis not present

## 2024-08-16 DIAGNOSIS — G250 Essential tremor: Secondary | ICD-10-CM | POA: Diagnosis not present

## 2024-08-16 DIAGNOSIS — I6381 Other cerebral infarction due to occlusion or stenosis of small artery: Secondary | ICD-10-CM | POA: Diagnosis not present

## 2024-08-16 DIAGNOSIS — I6523 Occlusion and stenosis of bilateral carotid arteries: Secondary | ICD-10-CM | POA: Diagnosis not present

## 2024-08-17 ENCOUNTER — Ambulatory Visit: Admitting: Nurse Practitioner

## 2024-09-07 DIAGNOSIS — H04123 Dry eye syndrome of bilateral lacrimal glands: Secondary | ICD-10-CM | POA: Diagnosis not present

## 2024-09-07 DIAGNOSIS — H5203 Hypermetropia, bilateral: Secondary | ICD-10-CM | POA: Diagnosis not present

## 2024-09-07 DIAGNOSIS — H2513 Age-related nuclear cataract, bilateral: Secondary | ICD-10-CM | POA: Diagnosis not present

## 2024-09-13 ENCOUNTER — Ambulatory Visit: Admitting: Obstetrics and Gynecology

## 2024-09-27 ENCOUNTER — Other Ambulatory Visit: Payer: Self-pay | Admitting: Family Medicine

## 2024-09-27 DIAGNOSIS — Z1231 Encounter for screening mammogram for malignant neoplasm of breast: Secondary | ICD-10-CM

## 2024-10-05 ENCOUNTER — Ambulatory Visit
Admission: RE | Admit: 2024-10-05 | Discharge: 2024-10-05 | Disposition: A | Discharge status: Home / Self Care | Source: Ambulatory Visit | Attending: Family Medicine | Admitting: Family Medicine

## 2024-10-05 DIAGNOSIS — Z1231 Encounter for screening mammogram for malignant neoplasm of breast: Secondary | ICD-10-CM | POA: Diagnosis not present

## 2024-10-12 NOTE — Progress Notes (Unsigned)
 10/13/2024 Elizabeth Mata [id number] 18-Feb-1961  Gastroenterology Office Note    Primary Care Physician:  Leavy Mole, PA-C  Primary GI Provider: Jinny Carmine, MD   Chief Complaint   Chief Complaint  Patient presents with   Follow-up    Nausea- has abdominal pain before and during meals-bloated-diarrhea-has not noticed any blood-clears throat and coughs-eats a lot of tums     History of Present Illness   Elizabeth Mata is a 63 y.o. female presenting today due to nausea, abdominal pain, and bloating.   Last seen by Dr. Therisa 11/06/2023 for bloating and regurgitation likely due to meloxicam .   Patient reports that she has abdominal pain before meals and after meals.  She often has bloating, pain can be epigastric or generalized.  She has intermittent nausea, denies vomiting.  She feels like she needs to clear her throat a lot and has coughing. She says she has tried several things over the past few years, states IBGard and Prilosec didn't help, unsure if dicyclomine  or helped her in the past.  She takes a lot of Tums.  States she has not used meloxicam  in a long time or any other NSAIDs since seeing Dr. Therisa last.  Denies drinking sodas, rare alcohol use.  She tries to drink a lot of water.   Patient reports having diarrhea often, stools are always soft or loose.  They have 3-4 loose stools in a day some days are better than others.  She has urgency when it is bad. Denies melena or hematochezia. No unintentional weight loss.   12/27/2022: EGD: erosive gastritis. Negative for H. Pylori, intestinal metaplasia, and malignancy.   11/07/2022: CT abdomen pelvis shows no acute findings  12/28/20 EGD: Reactive gastropathy.  Colonoscopy: no evidence of microscopic colitis.     Past Medical History:  Diagnosis Date   Abdominal pain 11/14/2011   Allergic rhinitis 06/30/2016   Allergy     Blurred vision 03/08/2014   Carotid atherosclerosis, bilateral 11/06/2017   US  Nov 2018    Cyst of breast, left, solitary    Encounter for other screening for malignant neoplasm of breast 04/26/2013   Encounter for screening, unspecified 11/22/2008   Esophageal reflux 11/02/2007   Flank pain    GERD (gastroesophageal reflux disease)    Glucose intolerance 04/13/2010   Hemorrhoid    Hemorrhoids    Hyperlipidemia    IFG (impaired fasting glucose) 07/18/2016   June 2017    Irritable bowel syndrome 12/09/2014   Kidney stone on left side 10/24/2015   Kidney stones    Knee pain 12/26/2011   Lateral epicondylitis 12/09/2014   Microscopic hematuria 11/02/2007   Nephrolithiasis 05/08/2012   Neuropathy 07/24/2016   Osteoarthritis 12/23/2007   Pelvic pain 05/04/2013   Seizures (HCC)    Shingles outbreak 02/03/2017   Shoulder pain 04/17/2012   Stomach irritation    Syncope    Recurrent over the last 2 years. had workup at [facility] this year with cardiology and neurology. Had echo and 3-week event monitor that were unremarkable. Has had negative EEG. Echo in 11/11 at [facility] showed no significant abnormalities. Possibly vasovagal   TIA (transient ischemic attack) 10/28/2017   Trigger thumb     Past Surgical History:  Procedure Laterality Date   ABDOMINAL HYSTERECTOMY  40s   endometriosis, fibroid tumors; no cancer, partial   BREAST EXCISIONAL BIOPSY Left 1980'S   EXCISIONAL - NEG   BREAST SURGERY     CHOLECYSTECTOMY  31   COLONOSCOPY WITH  PROPOFOL  N/A 06/23/2019   Procedure: COLONOSCOPY WITH PROPOFOL ;  Surgeon: Janalyn Keene NOVAK, MD;  Location: [facility] ENDOSCOPY;  Service: Endoscopy;  Laterality: N/A;   COLONOSCOPY WITH PROPOFOL  N/A 12/28/2020   Procedure: COLONOSCOPY WITH PROPOFOL ;  Surgeon: Janalyn Keene NOVAK, MD;  Location: [facility] ENDOSCOPY;  Service: Endoscopy;  Laterality: N/A;   ESOPHAGOGASTRODUODENOSCOPY (EGD) WITH PROPOFOL  N/A 06/23/2019   Procedure: ESOPHAGOGASTRODUODENOSCOPY (EGD) WITH PROPOFOL ;  Surgeon: Janalyn Keene NOVAK, MD;  Location: [facility] ENDOSCOPY;  Service:  Endoscopy;  Laterality: N/A;   ESOPHAGOGASTRODUODENOSCOPY (EGD) WITH PROPOFOL  N/A 12/28/2020   Procedure: ESOPHAGOGASTRODUODENOSCOPY (EGD) WITH PROPOFOL ;  Surgeon: Janalyn Keene NOVAK, MD;  Location: [facility] ENDOSCOPY;  Service: Endoscopy;  Laterality: N/A;   ESOPHAGOGASTRODUODENOSCOPY (EGD) WITH PROPOFOL  N/A 12/27/2022   Procedure: ESOPHAGOGASTRODUODENOSCOPY (EGD) WITH PROPOFOL ;  Surgeon: Therisa Bi, MD;  Location: [facility] ENDOSCOPY;  Service: Gastroenterology;  Laterality: N/A;   FOOT SURGERY Right    tendon release   VULVECTOMY PARTIAL N/A 01/01/2023   Procedure: EXAM UNDER ANESTHESIA,VULVECTOMY PARTIAL, COLPOSCOPY;  Surgeon: Elby Webb Loges, MD;  Location: [facility] ORS;  Service: Gynecology;  Laterality: N/A;    Current Outpatient Medications  Medication Sig Dispense Refill   aspirin  EC 81 MG EC tablet Take 1 tablet (81 mg total) by mouth daily. 30 tablet 1   atorvastatin  (LIPITOR) 80 MG tablet TAKE 1 TABLET BY MOUTH EVERY DAY 90 tablet 1   ezetimibe  (ZETIA ) 10 MG tablet TAKE 1 TABLET BY MOUTH EVERY DAY 90 tablet 1   fexofenadine  (ALLEGRA ) 180 MG tablet Take 180 mg by mouth daily.     ibuprofen  (ADVIL ) 200 MG tablet Take 200 mg by mouth daily as needed for headache.     mometasone  (ELOCON ) 0.1 % cream Apply topically to flares at body qd/bid prn for up to 2 weeks. Avoid applying to face, groin, and axilla. Use as directed. 45 g 3   mometasone  (NASONEX ) 50 MCG/ACT nasal spray Place 2 sprays into the nose daily. 1 each 12   pantoprazole  (PROTONIX ) 40 MG tablet Take 1 tablet (40 mg total) by mouth daily. 30 tablet 2   topiramate  (TOPAMAX ) 200 MG tablet Take 200 mg by mouth 2 (two) times daily.   11   No current facility-administered medications for this visit.    Allergies as of 10/13/2024 - Review Complete 10/13/2024  Allergen Reaction Noted   Clindamycin /lincomycin Hives 11/10/2015   Flagyl  [metronidazole ] Hives 11/07/2010   Hydrocodone  Nausea Only 03/01/2015   Sulfa antibiotics Hives  07/05/2014   Roxicodone  [oxycodone ] Nausea And Vomiting 02/05/2017    Family History  Problem Relation Age of Onset   Coronary artery disease Maternal Uncle    Diabetes Mellitus II Mother    Arthritis Mother    Diabetes Maternal Uncle    Kidney cancer Father 60       died 04-22-2017   Kidney disease Father    Cancer Father        kidney and bladder   Arthritis Maternal Uncle    Cancer Maternal Grandmother        stomach?   Jaundice Maternal Grandmother    Stomach cancer Paternal Grandmother    Cancer Paternal Grandmother        stomach   Hypertension Sister    Stroke Maternal Grandfather    Diabetes Paternal Grandfather    Diabetes Sister    Hypertension Sister    Breast cancer Neg Hx    Bladder Cancer Neg Hx     Social History   Socioeconomic History  Marital status: Legally Separated    Spouse name: Not on file   Number of children: 2   Years of education: Not on file   Highest education level: GED or equivalent  Occupational History   Occupation: unemployed  Tobacco Use   Smoking status: Never    Passive exposure: Never   Smokeless tobacco: Never  Vaping Use   Vaping status: Never Used  Substance and Sexual Activity   Alcohol use: Yes    Comment: occasionally   Drug use: Never   Sexual activity: Yes    Partners: Male    Birth control/protection: Surgical    Comment: Hysterectomy  Other Topics Concern   Not on file  Social History Narrative   Regular exercise   Social Drivers of Health   Financial Resource Strain: Low Risk  (06/06/2023)   Overall Financial Resource Strain (CARDIA)    Difficulty of Paying Living Expenses: Not hard at all  Food Insecurity: No Food Insecurity (06/06/2023)   Hunger Vital Sign    Worried About Running Out of Food in the Last Year: Never true    Ran Out of Food in the Last Year: Never true  Transportation Needs: No Transportation Needs (06/06/2023)   PRAPARE - Administrator, Civil Service (Medical): No     Lack of Transportation (Non-Medical): No  Physical Activity: Sufficiently Active (06/06/2023)   Exercise Vital Sign    Days of Exercise per Week: 7 days    Minutes of Exercise per Session: 30 min  Stress: No Stress Concern Present (06/06/2023)   [employer] of Occupational Health - Occupational Stress Questionnaire    Feeling of Stress : Not at all  Social Connections: Moderately Isolated (06/06/2023)   Social Connection and Isolation Panel    Frequency of Communication with Friends and Family: More than three times a week    Frequency of Social Gatherings with Friends and Family: Three times a week    Attends Religious Services: More than 4 times per year    Active Member of Clubs or Organizations: No    Attends Banker Meetings: Never    Marital Status: Separated  Intimate Partner Violence: Not At Risk (06/06/2023)   Humiliation, Afraid, Rape, and Kick questionnaire    Fear of Current or Ex-Partner: No    Emotionally Abused: No    Physically Abused: No    Sexually Abused: No     RELEVANT GI HISTORY, IMAGING AND LABS: CBC    Component Value Date/Time   WBC 5.2 03/27/2024 1525   RBC 4.32 03/27/2024 1525   HGB 13.6 03/27/2024 1528   HGB 14.3 10/24/2015 1645   HCT 40.0 03/27/2024 1528   HCT 42.5 10/24/2015 1645   PLT 237 03/27/2024 1525   PLT 375 10/24/2015 1645   MCV 94.2 03/27/2024 1525   MCV 92 10/24/2015 1645   MCV 95 12/31/2014 1450   MCH 31.7 03/27/2024 1525   MCHC 33.7 03/27/2024 1525   RDW 12.4 03/27/2024 1525   RDW 13.7 10/24/2015 1645   RDW 13.1 12/31/2014 1450   LYMPHSABS 2.8 03/27/2024 1525   LYMPHSABS 3.3 (H) 10/24/2015 1645   MONOABS 0.3 03/27/2024 1525   EOSABS 0.0 03/27/2024 1525   EOSABS 0.1 10/24/2015 1645   BASOSABS 0.0 03/27/2024 1525   BASOSABS 0.0 10/24/2015 1645   Recent Labs    03/27/24 1525 03/27/24 1528  HGB 13.7 13.6    CMP     Component Value Date/Time   NA 142 04/23/2024  1455   NA 145 (H) 10/24/2015 1645   NA 144  12/31/2014 1450   K 4.2 04/23/2024 1455   K 3.6 12/31/2014 1450   CL 111 (H) 04/23/2024 1455   CL 113 (H) 12/31/2014 1450   CO2 22 04/23/2024 1455   CO2 21 12/31/2014 1450   GLUCOSE 84 04/23/2024 1455   GLUCOSE 107 (H) 12/31/2014 1450   BUN 11 04/23/2024 1455   BUN 11 10/24/2015 1645   BUN 17 12/31/2014 1450   CREATININE 0.77 04/23/2024 1455   CALCIUM  9.9 04/23/2024 1455   CALCIUM  9.4 12/31/2014 1450   PROT 6.7 04/23/2024 1455   PROT 6.5 07/21/2019 1535   PROT 7.8 05/03/2012 0903   ALBUMIN 3.5 03/27/2024 1525   ALBUMIN 4.4 07/21/2019 1535   ALBUMIN 3.8 05/03/2012 0903   AST 29 04/23/2024 1455   AST 30 05/03/2012 0903   ALT 31 (H) 04/23/2024 1455   ALT 25 05/03/2012 0903   ALKPHOS 70 03/27/2024 1525   ALKPHOS 88 05/03/2012 0903   BILITOT 0.7 04/23/2024 1455   BILITOT 0.4 07/21/2019 1535   BILITOT 0.6 05/03/2012 0903   GFRNONAA >60 03/27/2024 1525   GFRNONAA 70 05/18/2019 1137   GFRAA >60 08/05/2020 0533   GFRAA 81 05/18/2019 1137      Latest Ref Rng & Units 04/23/2024    2:55 PM 03/27/2024    3:25 PM 03/17/2023   12:19 PM  Hepatic Function  Total Protein 6.1 - 8.1 g/dL 6.7  5.9  6.8   Albumin 3.5 - 5.0 g/dL  3.5    AST 10 - 35 U/L 29  68  47   ALT 6 - 29 U/L 31  57  62   Alk Phosphatase 38 - 126 U/L  70    Total Bilirubin 0.2 - 1.2 mg/dL 0.7  1.1  1.1       Review of Systems   All systems reviewed and negative except where noted in HPI.    Physical Exam  BP 118/80   Pulse 78   Temp 98 F (36.7 C)   Ht 4' 11 (1.499 m)   Wt 135 lb 12.8 oz (61.6 kg)   SpO2 100%   BMI 27.43 kg/m  No LMP recorded. Patient has had a hysterectomy. General:   Alert and oriented. Pleasant and cooperative. Well-nourished and well-developed. In no acute distress.  Head:  Normocephalic and atraumatic. Eyes:  Without icterus Ears:  Normal auditory acuity. Lungs:  Respirations even and unlabored.  Clear throughout to auscultation.   No wheezes, crackles, or rhonchi. No acute  distress. Heart:  Regular rate and rhythm; no murmurs, clicks, rubs, or gallops. Abdomen:  Normal bowel sounds. No bruits. Diffuse mild tenderness, soft, non-distended without masses, hepatosplenomegaly or hernias noted.  No guarding or rebound tenderness.  Rectal:  Deferred.  Msk:  Symmetrical without gross deformities. Normal posture. Extremities:  Without edema. Neurologic:  Alert and  oriented x4;  grossly normal neurologically. Skin:  Intact without significant lesions or rashes. Psych:  Alert and cooperative. Normal mood and affect.   Assessment & Plan   Elizabeth Mata is a 63 y.o. female presenting today with multiple GI complaints including nausea, chronic diarrhea, abdominal pain, and throat clearing/cough.   Dyspepsia with nausea, bloating, abdominal pain. Also has throat clearing and cough. Patient is not consistent with past therapies and was unsure on some medications that were prescribed in the past. Unsure of when she stopped omeprazole . - Continue to avoid NSAIDS, caffeine -  start pantoprazole  40 mg once daily 30 min prior to meals. - proceed with EGD due to prior history of erosive gastritis.   Diarrhea. Likely IBS-D.  Patient was unsure if Questran  helped with diarrhea  - check CBC, CMP, celiac, stool pathogen and fecal elastase.  - Increase fiber.  - take Imodium  as needed. - Discussed TCA treatment for IBS, patient would like to wait and discuss next visit.   Follow-up in 4 weeks  The patient was advised to call back or seek an in-person evaluation if the symptoms worsen or if the condition fails to improve as anticipated.  Grayce Bohr, DNP, AGNP-C [facility]

## 2024-10-13 ENCOUNTER — Ambulatory Visit (INDEPENDENT_AMBULATORY_CARE_PROVIDER_SITE_OTHER): Admitting: Family Medicine

## 2024-10-13 ENCOUNTER — Encounter: Payer: Self-pay | Admitting: Family Medicine

## 2024-10-13 VITALS — BP 118/80 | HR 78 | Temp 98.0°F | Ht 59.0 in | Wt 135.8 lb

## 2024-10-13 DIAGNOSIS — R110 Nausea: Secondary | ICD-10-CM

## 2024-10-13 DIAGNOSIS — R1084 Generalized abdominal pain: Secondary | ICD-10-CM

## 2024-10-13 DIAGNOSIS — R1013 Epigastric pain: Secondary | ICD-10-CM

## 2024-10-13 DIAGNOSIS — R109 Unspecified abdominal pain: Secondary | ICD-10-CM | POA: Diagnosis not present

## 2024-10-13 DIAGNOSIS — R197 Diarrhea, unspecified: Secondary | ICD-10-CM

## 2024-10-13 MED ORDER — PANTOPRAZOLE SODIUM 40 MG PO TBEC: 40.0000 mg | DELAYED_RELEASE_TABLET | Freq: Every day | ORAL | 2 refills | Status: DC

## 2024-10-13 NOTE — Patient Instructions (Addendum)
 Start Benefiber Mix 1 TBSP in a drink daily.  Please see your follow appointment listed below.

## 2024-10-14 ENCOUNTER — Telehealth: Payer: Self-pay | Admitting: Family Medicine

## 2024-10-14 NOTE — Telephone Encounter (Signed)
 Pt requesting call back has question in ref to procedure on 10/23./2025 [phone]

## 2024-10-15 LAB — CELIAC DISEASE AB SCREEN W/RFX
Antigliadin Abs, IgA: 2 U (ref 0–19)
IgA/Immunoglobulin A, Serum: 126 mg/dL (ref 87–352)
Transglutaminase IgA: <2 U/mL (ref 0–3)

## 2024-10-15 LAB — CBC WITH DIFFERENTIAL/PLATELET
Basophils Absolute: 0 x10E3/uL (ref 0.0–0.2)
Basos: 0 %
EOS (ABSOLUTE): 0 x10E3/uL (ref 0.0–0.4)
Eos: 1 %
Hematocrit: 44.9 % (ref 34.0–46.6)
Hemoglobin: 14.6 g/dL (ref 11.1–15.9)
Immature Grans (Abs): 0 x10E3/uL (ref 0.0–0.1)
Immature Granulocytes: 0 %
Lymphocytes Absolute: 2.4 x10E3/uL (ref 0.7–3.1)
Lymphs: 56 %
MCH: 31 pg (ref 26.6–33.0)
MCHC: 32.5 g/dL (ref 31.5–35.7)
MCV: 95 fL (ref 79–97)
Monocytes Absolute: 0.2 x10E3/uL (ref 0.1–0.9)
Monocytes: 6 %
Neutrophils Absolute: 1.5 x10E3/uL (ref 1.4–7.0)
Neutrophils: 37 %
Platelets: 283 x10E3/uL (ref 150–450)
RBC: 4.71 x10E6/uL (ref 3.77–5.28)
RDW: 12.7 % (ref 11.7–15.4)
WBC: 4.2 x10E3/uL (ref 3.4–10.8)

## 2024-10-15 LAB — COMPREHENSIVE METABOLIC PANEL WITH GFR
ALT: 37 IU/L — ABNORMAL HIGH (ref 0–32)
AST: 36 IU/L (ref 0–40)
Albumin: 4.5 g/dL (ref 3.9–4.9)
Alkaline Phosphatase: 100 IU/L (ref 49–135)
BUN/Creatinine Ratio: 10 — ABNORMAL LOW (ref 12–28)
BUN: 10 mg/dL (ref 8–27)
Bilirubin Total: 0.5 mg/dL (ref 0.0–1.2)
CO2: 17 mmol/L — ABNORMAL LOW (ref 20–29)
Calcium: 10.1 mg/dL (ref 8.7–10.3)
Chloride: 111 mmol/L — ABNORMAL HIGH (ref 96–106)
Creatinine, Ser: 1.01 mg/dL — ABNORMAL HIGH (ref 0.57–1.00)
Globulin, Total: 2.2 g/dL (ref 1.5–4.5)
Glucose: 94 mg/dL (ref 70–99)
Potassium: 4 mmol/L (ref 3.5–5.2)
Sodium: 143 mmol/L (ref 134–144)
Total Protein: 6.7 g/dL (ref 6.0–8.5)
eGFR: 63 mL/min/1.73 (ref >59)

## 2024-10-18 ENCOUNTER — Other Ambulatory Visit: Payer: Self-pay | Admitting: Family Medicine

## 2024-10-18 ENCOUNTER — Ambulatory Visit: Payer: Self-pay | Admitting: Family Medicine

## 2024-10-18 DIAGNOSIS — E782 Mixed hyperlipidemia: Secondary | ICD-10-CM

## 2024-10-19 ENCOUNTER — Telehealth: Payer: Self-pay | Admitting: Family Medicine

## 2024-10-19 NOTE — Telephone Encounter (Signed)
 LVM to return call to office.  Kidney function stable, ALT mildly elevated at 37. CBC unremarkable. Celiac negative.    Dorothe, please call and let patient know that her liver enzyme ALT was mildly elevated. Otherwise her labs are stable, no celiac or gluten allergy . We can recheck the ALT in 3 months.    Also please remind her to do her stool studies that were also ordered. Thanks.    Grayce Bohr, DNP, AGNP-C

## 2024-10-19 NOTE — Telephone Encounter (Signed)
 Pt returning call to discuss blood work [phone]

## 2024-10-19 NOTE — Telephone Encounter (Signed)
 Pt returning call to discuss labs please return call

## 2024-10-19 NOTE — Progress Notes (Signed)
 Patient was concerned about ALT elevated at 37.  I called the patient and explained to her this is a very mild elevation.  All other liver enzymes within normal range along with platelets.  Discussed dietary modifications and weight loss.  Assured patient that we will monitor this in 3 months.   .resig

## 2024-10-19 NOTE — Telephone Encounter (Signed)
 Requested Prescriptions  Pending Prescriptions Disp Refills   ezetimibe  (ZETIA ) 10 MG tablet [Pharmacy Med Name: EZETIMIBE  10 MG TABLET] 90 tablet 1    Sig: TAKE 1 TABLET BY MOUTH EVERY DAY     Cardiovascular:  Antilipid - Sterol Transport Inhibitors Failed - 10/19/2024  2:38 PM      Failed - ALT in normal range and within 360 days    ALT  Date Value Ref Range Status  10/13/2024 37 (H) 0 - 32 IU/L Final   SGPT (ALT)  Date Value Ref Range Status  05/03/2012 25 U/L Final    Comment:    12-78 NOTE: NEW REFERENCE RANGE 11/22/2011          Failed - Lipid Panel in normal range within the last 12 months    Cholesterol  Date Value Ref Range Status  04/23/2024 167 <200 mg/dL Final   LDL Cholesterol (Calc)  Date Value Ref Range Status  04/23/2024 100 (H) mg/dL (calc) Final    Comment:    Reference range: <100 . Desirable range <100 mg/dL for primary prevention;   <70 mg/dL for patients with CHD or diabetic patients  with > or = 2 CHD risk factors. SABRA LDL-C is now calculated using the Martin-Hopkins  calculation, which is a validated novel method providing  better accuracy than the Friedewald equation in the  estimation of LDL-C.  Gladis APPLETHWAITE et al. SANDREA. 7986;689(80): 2061-2068  (http://education.[facility].com/faq/FAQ164)    HDL  Date Value Ref Range Status  04/23/2024 52 > OR = 50 mg/dL Final   Triglycerides  Date Value Ref Range Status  04/23/2024 65 <150 mg/dL Final         Passed - AST in normal range and within 360 days    AST  Date Value Ref Range Status  10/13/2024 36 0 - 40 IU/L Final   SGOT(AST)  Date Value Ref Range Status  05/03/2012 30 15 - 37 Unit/L Final         Passed - Patient is not pregnant      Passed - Valid encounter within last 12 months    Recent Outpatient Visits           5 months ago Mixed hyperlipidemia   [facility] Leavy Mole, PA-C

## 2024-10-21 ENCOUNTER — Ambulatory Visit
Admission: RE | Admit: 2024-10-21 | Discharge: 2024-10-21 | Disposition: A | Discharge status: Home / Self Care | Attending: Gastroenterology | Admitting: Gastroenterology

## 2024-10-21 ENCOUNTER — Other Ambulatory Visit: Payer: Self-pay

## 2024-10-21 ENCOUNTER — Ambulatory Visit: Payer: Self-pay

## 2024-10-21 ENCOUNTER — Encounter: Payer: Self-pay | Admitting: Gastroenterology

## 2024-10-21 ENCOUNTER — Ambulatory Visit: Admitting: Nurse Practitioner

## 2024-10-21 ENCOUNTER — Encounter: Admission: RE | Disposition: A | Payer: Self-pay | Source: Home / Self Care | Attending: Gastroenterology

## 2024-10-21 DIAGNOSIS — Z8673 Personal history of transient ischemic attack (TIA), and cerebral infarction without residual deficits: Secondary | ICD-10-CM | POA: Diagnosis not present

## 2024-10-21 DIAGNOSIS — R569 Unspecified convulsions: Secondary | ICD-10-CM | POA: Insufficient documentation

## 2024-10-21 DIAGNOSIS — K449 Diaphragmatic hernia without obstruction or gangrene: Secondary | ICD-10-CM | POA: Insufficient documentation

## 2024-10-21 DIAGNOSIS — K2990 Gastroduodenitis, unspecified, without bleeding: Secondary | ICD-10-CM | POA: Diagnosis not present

## 2024-10-21 DIAGNOSIS — E782 Mixed hyperlipidemia: Secondary | ICD-10-CM | POA: Diagnosis not present

## 2024-10-21 DIAGNOSIS — K30 Functional dyspepsia: Secondary | ICD-10-CM | POA: Insufficient documentation

## 2024-10-21 DIAGNOSIS — K219 Gastro-esophageal reflux disease without esophagitis: Secondary | ICD-10-CM | POA: Diagnosis not present

## 2024-10-21 DIAGNOSIS — K2970 Gastritis, unspecified, without bleeding: Secondary | ICD-10-CM | POA: Diagnosis not present

## 2024-10-21 DIAGNOSIS — K2980 Duodenitis without bleeding: Secondary | ICD-10-CM | POA: Insufficient documentation

## 2024-10-21 DIAGNOSIS — Z79899 Other long term (current) drug therapy: Secondary | ICD-10-CM | POA: Diagnosis not present

## 2024-10-21 DIAGNOSIS — K319 Disease of stomach and duodenum, unspecified: Secondary | ICD-10-CM | POA: Diagnosis not present

## 2024-10-21 DIAGNOSIS — K3189 Other diseases of stomach and duodenum: Secondary | ICD-10-CM

## 2024-10-21 DIAGNOSIS — R140 Abdominal distension (gaseous): Secondary | ICD-10-CM | POA: Insufficient documentation

## 2024-10-21 DIAGNOSIS — R1084 Generalized abdominal pain: Secondary | ICD-10-CM | POA: Diagnosis present

## 2024-10-21 HISTORY — PX: ESOPHAGOGASTRODUODENOSCOPY: SHX5428

## 2024-10-21 SURGERY — EGD (ESOPHAGOGASTRODUODENOSCOPY)
Anesthesia: General

## 2024-10-21 MED ORDER — LIDOCAINE HCL (PF) 2 % IJ SOLN
INTRAMUSCULAR | Status: AC
  Filled 2024-10-21: qty 5

## 2024-10-21 MED ORDER — GLYCOPYRROLATE 0.2 MG/ML IJ SOLN
INTRAMUSCULAR | Status: AC
  Filled 2024-10-21: qty 1

## 2024-10-21 MED ORDER — PROPOFOL 500 MG/50ML IV EMUL
INTRAVENOUS | Status: DC | PRN
  Administered 2024-10-21: 150 mg via INTRAVENOUS
  Administered 2024-10-21: 250 ug/kg/min via INTRAVENOUS

## 2024-10-21 MED ORDER — SODIUM CHLORIDE 0.9 % IV SOLN: INTRAVENOUS | Status: DC

## 2024-10-21 MED ORDER — LIDOCAINE HCL (PF) 2 % IJ SOLN
INTRAMUSCULAR | Status: DC | PRN
  Administered 2024-10-21: 100 mg via INTRADERMAL

## 2024-10-21 MED ORDER — PROPOFOL 1000 MG/100ML IV EMUL
INTRAVENOUS | Status: AC
  Filled 2024-10-21: qty 200

## 2024-10-21 NOTE — Anesthesia Postprocedure Evaluation (Signed)
 Anesthesia Post Note  Patient: Elizabeth Mata  Procedure(s) Performed: EGD (ESOPHAGOGASTRODUODENOSCOPY)  Patient location during evaluation: Endoscopy Anesthesia Type: General Level of consciousness: awake and alert Pain management: pain level controlled Vital Signs Assessment: post-procedure vital signs reviewed and stable Respiratory status: spontaneous breathing, nonlabored ventilation, respiratory function stable and patient connected to nasal cannula oxygen Cardiovascular status: blood pressure returned to baseline and stable Postop Assessment: no apparent nausea or vomiting Anesthetic complications: no   There were no known notable events for this encounter.   Last Vitals:  Vitals:   10/21/24 0919 10/21/24 0947  BP: (!) 115/99 113/82  Pulse: 85 68  Resp: 16   Temp: (!) 35.8 C (!) 35.8 C  SpO2: 98% 100%    Last Pain:  Vitals:   10/21/24 0947  TempSrc: Temporal                 Debby Mines

## 2024-10-21 NOTE — Op Note (Signed)
 [facility] Gastroenterology Patient Name: Elizabeth Mata Procedure Date: 10/21/2024 9:17 AM MRN: [id number] Account #: [id number] Date of Birth: 04-15-1961 Admit Type: Outpatient Age: 63 Room: [facility] ENDO ROOM 4 Gender: Female Note Status: Finalized Instrument Name: Upper GI Scope [id number] Procedure:             Upper GI endoscopy Indications:           Functional Dyspepsia Providers:             Rogelia Copping MD, MD Referring MD:          Rogelia Copping MD, MD (Referring MD), Michelene Cower                         (Referring MD) Medicines:             Propofol  per Anesthesia Complications:         No immediate complications. Procedure:             Pre-Anesthesia Assessment:                        - Prior to the procedure, a History and Physical was                         performed, and patient medications and allergies were                         reviewed. The patient's tolerance of previous                         anesthesia was also reviewed. The risks and benefits                         of the procedure and the sedation options and risks                         were discussed with the patient. All questions were                         answered, and informed consent was obtained. Prior                         Anticoagulants: The patient has taken no anticoagulant                         or antiplatelet agents. ASA Grade Assessment: II - A                         patient with mild systemic disease. After reviewing                         the risks and benefits, the patient was deemed in                         satisfactory condition to undergo the procedure.                        After obtaining informed consent, the endoscope was  passed under direct vision. Throughout the procedure,                         the patient's blood pressure, pulse, and oxygen                         saturations were monitored continuously. The Endoscope                          was introduced through the mouth, and advanced to the                         second part of duodenum. The upper GI endoscopy was                         accomplished without difficulty. The patient tolerated                         the procedure well. Findings:      A small hiatal hernia was present.      Localized mildly erythematous mucosa without bleeding was found in the       gastric antrum. Biopsies were taken with a cold forceps for histology.      Localized moderate inflammation characterized by erythema was found in       the duodenal bulb and in the first portion of the duodenum. Biopsies       were taken with a cold forceps for histology. Impression:            - Small hiatal hernia.                        - Erythematous mucosa in the antrum. Biopsied.                        - Duodenitis. Biopsied. Recommendation:        - Discharge patient to home.                        - Resume previous diet.                        - Continue present medications.                        - Await pathology results. Procedure Code(s):     --- Professional ---                        [phone], Esophagogastroduodenoscopy, flexible,                         transoral; with biopsy, single or multiple Diagnosis Code(s):     --- Professional ---                        K30, Functional dyspepsia                        K29.80, Duodenitis without bleeding                        K31.89, Other  diseases of stomach and duodenum CPT copyright 2022 American Medical Association. All rights reserved. The codes documented in this report are preliminary and upon coder review may  be revised to meet current compliance requirements. Rogelia Copping MD, MD 10/21/2024 9:45:19 AM This report has been signed electronically. Number of Addenda: 0 Note Initiated On: 10/21/2024 9:17 AM Estimated Blood Loss:  Estimated blood loss: none.      [facility]

## 2024-10-21 NOTE — Anesthesia Preprocedure Evaluation (Signed)
 Anesthesia Evaluation  Patient identified by MRN, date of birth, ID band Patient awake    Reviewed: Allergy  & Precautions, NPO status , Patient's Chart, lab work & pertinent test results  History of Anesthesia Complications (+) PONV and history of anesthetic complications  Airway Mallampati: II  TM Distance: >3 FB Neck ROM: Full    Dental  (+) Teeth Intact, Dental Advidsory Given, Partial Upper, Partial Lower, Missing   Pulmonary neg pulmonary ROS   Pulmonary exam normal breath sounds clear to auscultation       Cardiovascular Exercise Tolerance: Good negative cardio ROS Normal cardiovascular exam Rhythm:Regular Rate:Normal     Neuro/Psych  Headaches, Seizures -, Well Controlled,  TIAnegative neurological ROS  negative psych ROS   GI/Hepatic negative GI ROS, Neg liver ROS,GERD  Medicated,,  Endo/Other  negative endocrine ROS    Renal/GU      Musculoskeletal   Abdominal   Peds  Hematology negative hematology ROS (+)   Anesthesia Other Findings Past Medical History: 06/30/2016: Allergic rhinitis No date: Allergy  11/06/2017: Carotid atherosclerosis, bilateral     Comment:  US  Nov 2018 No date: Cyst of breast, left, solitary No date: Flank pain No date: GERD (gastroesophageal reflux disease) No date: Hemorrhoid No date: Hemorrhoids No date: Hyperlipidemia 07/18/2016: IFG (impaired fasting glucose)     Comment:  June 2017  10/24/2015: Kidney stone on left side No date: Kidney stones 07/24/2016: Neuropathy No date: Seizures (HCC) 02/03/2017: Shingles outbreak No date: Stomach irritation No date: Syncope     Comment:  Recurrent over the last 2 years. had workup at [facility] this               year with cardiology and neurology. Had echo and 3-week               event monitor that were unremarkable. Has had negative               EEG. Echo in 11/11 at [facility] showed no significant               abnormalities. Possibly  vasovagal 10/28/2017: TIA (transient ischemic attack) No date: Trigger thumb  Past Surgical History: 40s: ABDOMINAL HYSTERECTOMY     Comment:  endometriosis, fibroid tumors; no cancer, partial 1980'S: BREAST EXCISIONAL BIOPSY; Left     Comment:  EXCISIONAL - NEG No date: BREAST SURGERY 31: CHOLECYSTECTOMY 06/23/2019: COLONOSCOPY WITH PROPOFOL ; N/A     Comment:  Procedure: COLONOSCOPY WITH PROPOFOL ;  Surgeon:               Janalyn Keene NOVAK, MD;  Location: [facility] ENDOSCOPY;                Service: Endoscopy;  Laterality: N/A; 12/28/2020: COLONOSCOPY WITH PROPOFOL ; N/A     Comment:  Procedure: COLONOSCOPY WITH PROPOFOL ;  Surgeon:               Janalyn Keene NOVAK, MD;  Location: [facility] ENDOSCOPY;                Service: Endoscopy;  Laterality: N/A; 06/23/2019: ESOPHAGOGASTRODUODENOSCOPY (EGD) WITH PROPOFOL ; N/A     Comment:  Procedure: ESOPHAGOGASTRODUODENOSCOPY (EGD) WITH               PROPOFOL ;  Surgeon: Janalyn Keene NOVAK, MD;  Location:               [facility] ENDOSCOPY;  Service: Endoscopy;  Laterality: N/A; 12/28/2020: ESOPHAGOGASTRODUODENOSCOPY (EGD) WITH PROPOFOL ; N/A     Comment:  Procedure:  ESOPHAGOGASTRODUODENOSCOPY (EGD) WITH               PROPOFOL ;  Surgeon: Janalyn Keene NOVAK, MD;  Location:               [facility] ENDOSCOPY;  Service: Endoscopy;  Laterality: N/A; No date: FOOT SURGERY; Right     Comment:  tendon release     Reproductive/Obstetrics negative OB ROS                              Anesthesia Physical Anesthesia Plan  ASA: 3  Anesthesia Plan: General   Post-op Pain Management: Minimal or no pain anticipated   Induction: Intravenous  PONV Risk Score and Plan: 2 and Propofol  infusion and TIVA  Airway Management Planned: Nasal Cannula  Additional Equipment: None  Intra-op Plan:   Post-operative Plan:   Informed Consent: I have reviewed the patients History and Physical, chart, labs and discussed the procedure including the  risks, benefits and alternatives for the proposed anesthesia with the patient or authorized representative who has indicated his/her understanding and acceptance.     Dental advisory given  Plan Discussed with: CRNA and Surgeon  Anesthesia Plan Comments: (Discussed risks of anesthesia with patient, including possibility of difficulty with spontaneous ventilation under anesthesia necessitating airway intervention, PONV, and rare risks such as cardiac or respiratory or neurological events, and allergic reactions. Discussed the role of CRNA in patient's perioperative care. Patient understands.)        Anesthesia Quick Evaluation

## 2024-10-21 NOTE — H&P (Signed)
 Elizabeth Copping, MD [facility] [address] [place], [place] [address] Phone:[phone] Fax : [phone]  Primary Care Physician:  Leavy Mole, PA-C Primary Gastroenterologist:  Dr. Copping  Pre-Procedure History & Physical: HPI:  Elizabeth Mata is a 63 y.o. female is here for an endoscopy.   Past Medical History:  Diagnosis Date   Abdominal pain 11/14/2011   Allergic rhinitis 06/30/2016   Allergy     Blurred vision 03/08/2014   Carotid atherosclerosis, bilateral 11/06/2017   US  Nov 2018   Cyst of breast, left, solitary    Encounter for other screening for malignant neoplasm of breast 04/26/2013   Encounter for screening, unspecified 11/22/2008   Esophageal reflux 11/02/2007   Flank pain    GERD (gastroesophageal reflux disease)    Glucose intolerance 04/13/2010   Hemorrhoid    Hemorrhoids    Hyperlipidemia    IFG (impaired fasting glucose) 07/18/2016   June 2017    Irritable bowel syndrome 12/09/2014   Kidney stone on left side 10/24/2015   Kidney stones    Knee pain 12/26/2011   Lateral epicondylitis 12/09/2014   Microscopic hematuria 11/02/2007   Nephrolithiasis 05/08/2012   Neuropathy 07/24/2016   Osteoarthritis 12/23/2007   Pelvic pain 05/04/2013   Seizures (HCC)    Shingles outbreak 02/03/2017   Shoulder pain 04/17/2012   Stomach irritation    Syncope    Recurrent over the last 2 years. had workup at [facility] this year with cardiology and neurology. Had echo and 3-week event monitor that were unremarkable. Has had negative EEG. Echo in 11/11 at [facility] showed no significant abnormalities. Possibly vasovagal   TIA (transient ischemic attack) 10/28/2017   Trigger thumb     Past Surgical History:  Procedure Laterality Date   ABDOMINAL HYSTERECTOMY  40s   endometriosis, fibroid tumors; no cancer, partial   BREAST EXCISIONAL BIOPSY Left 1980'S   EXCISIONAL - NEG   BREAST SURGERY     CHOLECYSTECTOMY  31   COLONOSCOPY WITH PROPOFOL  N/A 06/23/2019   Procedure:  COLONOSCOPY WITH PROPOFOL ;  Surgeon: Janalyn Keene NOVAK, MD;  Location: [facility] ENDOSCOPY;  Service: Endoscopy;  Laterality: N/A;   COLONOSCOPY WITH PROPOFOL  N/A 12/28/2020   Procedure: COLONOSCOPY WITH PROPOFOL ;  Surgeon: Janalyn Keene NOVAK, MD;  Location: [facility] END[facility];  Service: Endoscopy;  Laterality: N/A;   ESOPHAGOGASTRODUODENOSCOPY (EGD) WITH PROPOFOL  N/A 06/23/2019   Procedure: ESOPHAGOGASTRODUODENOSCOPY (EGD) WITH PROPOFOL ;  Surgeon: Janalyn Keene NOVAK, MD;  Location: [facility] ENDOSCOPY;  Service: Endoscopy;  Laterality: N/A;   ESOPHAGOGASTRODUODENOSCOPY (EGD) WITH PROPOFOL  N/A 12/28/2020   Procedure: ESOPHAGOGASTRODUODENOSCOPY (EGD) WITH PROPOFOL ;  Surgeon: Janalyn Keene NOVAK, MD;  Location: [facility] ENDOSCOPY;  Service: Endoscopy;  Laterality: N/A;   ESOPHAGOGASTRODUODENOSCOPY (EGD) WITH PROPOFOL  N/A 12/27/2022   Procedure: ESOPHAGOGASTRODUODENOSCOPY (EGD) WITH PROPOFOL ;  Surgeon: Therisa Bi, MD;  Location[facility]COPY;  Service: Gastroenterology;  Laterality: N/A;   FOOT SURGERY Right    tendon release   VULVECTOMY PARTIAL N/A 01/01/2023   Procedure: EXAM UNDER ANESTHESIA,VULVECTOMY PARTIAL, COLPOSCOPY;  Surgeon: Elby Webb Loges, MD;  Location: [facility] ORS;  Service: Gynecology;  Laterality: N/A;    Prior to Admission medications   Medication Sig Start Date End Date Taking? Authorizing Provider  aspirin  EC 81 MG EC tablet Take 1 tablet (81 mg total) by mouth daily. 10/10/17  Yes Sainani, Vivek J, MD  atorvastatin  (LIPITOR) 80 MG tablet TAKE 1 TABLET BY MOUTH EVERY DAY 05/25/24  Yes Tapia, Leisa, PA-C  ezetimibe  (ZETIA ) 10 MG tablet TAKE 1 TABLET BY MOUTH EVERY  DAY 10/19/24  Yes Tapia, Leisa, PA-C  fexofenadine  (ALLEGRA ) 180 MG tablet Take 180 mg by mouth daily.   Yes [provider]  pantoprazole  (PROTONIX ) 40 MG tablet Take 1 tablet (40 mg total) by mouth daily. 10/13/24  Yes Celestia Rima, NP  topiramate  (TOPAMAX ) 200 MG tablet Take 200 mg by mouth 2 (two) times  daily.  01/23/17  Yes [provider]  ibuprofen  (ADVIL ) 200 MG tablet Take 200 mg by mouth daily as needed for headache.    [provider]  mometasone  (ELOCON ) 0.1 % cream Apply topically to flares at body qd/bid prn for up to 2 weeks. Avoid applying to face, groin, and axilla. Use as directed. 10/20/23   Jackquline Sawyer, MD  mometasone  (NASONEX ) 50 MCG/ACT nasal spray Place 2 sprays into the nose daily. 04/23/24   Tapia, Leisa, PA-C    Allergies as of 10/13/2024 - Review Complete 10/13/2024  Allergen Reaction Noted   Clindamycin /lincomycin Hives 11/10/2015   Flagyl  [metronidazole ] Hives 11/07/2010   Hydrocodone  Nausea Only 03/01/2015   Sulfa antibiotics Hives 07/05/2014   Roxicodone  [oxycodone ] Nausea And Vomiting 02/05/2017    Family History  Problem Relation Age of Onset   Coronary artery disease Maternal Uncle    Diabetes Mellitus II Mother    Arthritis Mother    Diabetes Maternal Uncle    Kidney cancer Father 103       died 04-28-2017   Kidney disease Father    Cancer Father        kidney and bladder   Arthritis Maternal Uncle    Cancer Maternal Grandmother        stomach?   Jaundice Maternal Grandmother    Stomach cancer Paternal Grandmother    Cancer Paternal Grandmother        stomach   Hypertension Sister    Stroke Maternal Grandfather    Diabetes Paternal Grandfather    Diabetes Sister    Hypertension Sister    Breast cancer Neg Hx    Bladder Cancer Neg Hx     Social History   Socioeconomic History   Marital status: Legally Separated    Spouse name: Not on file   Number of children: 2   Years of education: Not on file   Highest education level: GED or equivalent  Occupational History   Occupation: unemployed  Tobacco Use   Smoking status: Never    Passive exposure: Never   Smokeless tobacco: Never  Vaping Use   Vaping status: Never Used  Substance and Sexual Activity   Alcohol use: Yes    Comment: occasionally   Drug use: Never    Sexual activity: Yes    Partners: Male    Birth control/protection: Surgical    Comment: Hysterectomy  Other Topics Concern   Not on file  Social History Narrative   Regular exercise   Social Drivers of Health   Financial Resource Strain: Low Risk  (06/06/2023)   Overall Financial Resource Strain (CARDIA)    Difficulty of Paying Living Expenses: Not hard at all  Food Insecurity: No Food Insecurity (06/06/2023)   Hunger Vital Sign    Worried About Running Out of Food in the Last Year: Never true    Ran Out of Food in the Last Year: Never true  Transportation Needs: No Transportation Needs (06/06/2023)   PRAPARE - Administrator, Civil Service (Medical): No    Lack of Transportation (Non-Medical): No  Physical Activity: Sufficiently Active (06/06/2023)  Exercise Vital Sign    Days of Exercise per Week: 7 days    Minutes of Exercise per Session: 30 min  Stress: No Stress Concern Present (06/06/2023)   [employer] of Occupational Health - Occupational Stress Questionnaire    Feeling of Stress : Not at all  Social Connections: Moderately Isolated (06/06/2023)   Social Connection and Isolation Panel    Frequency of Communication with Friends and Family: More than three times a week    Frequency of Social Gatherings with Friends and Family: Three times a week    Attends Religious Services: More than 4 times per year    Active Member of Clubs or Organizations: No    Attends Banker Meetings: Never    Marital Status: Separated  Intimate Partner Violence: Not At Risk (06/06/2023)   Humiliation, Afraid, Rape, and Kick questionnaire    Fear of Current or Ex-Partner: No    Emotionally Abused: No    Physically Abused: No    Sexually Abused: No    Review of Systems: See HPI, otherwise negative ROS  Physical Exam: BP (!) 115/99   Pulse 85   Temp (!) 96.5 F (35.8 C) (Temporal)   Resp 16   Wt 61.2 kg   SpO2 98%   BMI 27.27 kg/m  General:   Alert,  pleasant  and cooperative in NAD Head:  Normocephalic and atraumatic. Neck:  Supple; no masses or thyromegaly. Lungs:  Clear throughout to auscultation.    Heart:  Regular rate and rhythm. Abdomen:  Soft, nontender and nondistended. Normal bowel sounds, without guarding, and without rebound.   Neurologic:  Alert and  oriented x4;  grossly normal neurologically.  Impression/Plan: Elizabeth Mata is here for an endoscopy to be performed for dyspepsia bloating epigastric pain.  Risks, benefits, limitations, and alternatives regarding  endoscopy have been reviewed with the patient.  Questions have been answered.  All parties agreeable.   Elizabeth Copping, MD  10/21/2024, 9:28 AM

## 2024-10-21 NOTE — Transfer of Care (Signed)
 Immediate Anesthesia Transfer of Care Note  Patient: Elizabeth Mata  Procedure(s) Performed: EGD (ESOPHAGOGASTRODUODENOSCOPY)  Patient Location: PACU  Anesthesia Type:General  Level of Consciousness: sedated  Airway & Oxygen Therapy: Patient Spontanous Breathing  Post-op Assessment: Report given to RN and Post -op Vital signs reviewed and stable  Post vital signs: Reviewed and stable  Last Vitals:  Vitals Value Taken Time  BP 113/82 10/21/24 09:47  Temp    Pulse 67 10/21/24 09:48  Resp 15 10/21/24 09:48  SpO2 100 % 10/21/24 09:48  Vitals shown include unfiled device data.  Last Pain:  Vitals:   10/21/24 0919  TempSrc: Temporal         Complications: There were no known notable events for this encounter.

## 2024-10-25 ENCOUNTER — Ambulatory Visit: Admitting: Family Medicine

## 2024-10-25 ENCOUNTER — Ambulatory Visit: Admitting: Nurse Practitioner

## 2024-10-25 LAB — SURGICAL PATHOLOGY

## 2024-10-28 ENCOUNTER — Telehealth: Payer: Self-pay

## 2024-10-28 ENCOUNTER — Ambulatory Visit: Payer: Self-pay | Admitting: Gastroenterology

## 2024-10-28 NOTE — Telephone Encounter (Signed)
 Pt called requesting pathology results from her recent EGD... Please advise

## 2024-10-29 NOTE — Telephone Encounter (Signed)
 I spoke to pt and she is aware of results and expressed understanding

## 2024-11-05 ENCOUNTER — Ambulatory Visit: Admitting: Family Medicine

## 2024-11-10 ENCOUNTER — Ambulatory Visit (INDEPENDENT_AMBULATORY_CARE_PROVIDER_SITE_OTHER): Admitting: Nurse Practitioner

## 2024-11-10 ENCOUNTER — Encounter: Payer: Self-pay | Admitting: Nurse Practitioner

## 2024-11-10 VITALS — BP 112/64 | HR 83 | Temp 98.4°F | Resp 16 | Ht 59.0 in | Wt 134.6 lb

## 2024-11-10 DIAGNOSIS — K581 Irritable bowel syndrome with constipation: Secondary | ICD-10-CM | POA: Diagnosis not present

## 2024-11-10 DIAGNOSIS — R748 Abnormal levels of other serum enzymes: Secondary | ICD-10-CM | POA: Diagnosis not present

## 2024-11-10 DIAGNOSIS — R202 Paresthesia of skin: Secondary | ICD-10-CM | POA: Diagnosis not present

## 2024-11-10 DIAGNOSIS — G43009 Migraine without aura, not intractable, without status migrainosus: Secondary | ICD-10-CM | POA: Diagnosis not present

## 2024-11-10 DIAGNOSIS — R7303 Prediabetes: Secondary | ICD-10-CM

## 2024-11-10 DIAGNOSIS — C519 Malignant neoplasm of vulva, unspecified: Secondary | ICD-10-CM | POA: Diagnosis not present

## 2024-11-10 DIAGNOSIS — Z8673 Personal history of transient ischemic attack (TIA), and cerebral infarction without residual deficits: Secondary | ICD-10-CM | POA: Diagnosis not present

## 2024-11-10 DIAGNOSIS — J309 Allergic rhinitis, unspecified: Secondary | ICD-10-CM

## 2024-11-10 DIAGNOSIS — E782 Mixed hyperlipidemia: Secondary | ICD-10-CM | POA: Diagnosis not present

## 2024-11-10 DIAGNOSIS — I6523 Occlusion and stenosis of bilateral carotid arteries: Secondary | ICD-10-CM | POA: Diagnosis not present

## 2024-11-10 DIAGNOSIS — R200 Anesthesia of skin: Secondary | ICD-10-CM | POA: Diagnosis not present

## 2024-11-10 DIAGNOSIS — K3189 Other diseases of stomach and duodenum: Secondary | ICD-10-CM

## 2024-11-10 MED ORDER — ATORVASTATIN CALCIUM 80 MG PO TABS: 80.0000 mg | ORAL_TABLET | Freq: Every day | ORAL | 1 refills | Status: AC

## 2024-11-10 MED ORDER — EZETIMIBE 10 MG PO TABS: 10.0000 mg | ORAL_TABLET | Freq: Every day | ORAL | 3 refills | Status: AC

## 2024-11-10 NOTE — Assessment & Plan Note (Signed)
Rechecking liver enzymes today

## 2024-11-10 NOTE — Assessment & Plan Note (Signed)
 Continue taking Protonix  40 mg daily. Follow-up with GI, appt scheduled for December 2nd.

## 2024-11-10 NOTE — Assessment & Plan Note (Signed)
 Taking atorvastatin  80 mg and Zetia  10 mg at night. LDL 6 months ago was 100. Plan to recheck today. Discussed further treatment options if LDL is not at goal (<70)

## 2024-11-10 NOTE — Progress Notes (Signed)
 BP 112/64   Pulse 83   Temp 98.4 F (36.9 C)   Resp 16   Ht 4' 11 (1.499 m)   Wt 134 lb 9.6 oz (61.1 kg)   SpO2 93%   BMI 27.19 kg/m    Subjective:    Patient ID: Elizabeth Mata, female    DOB: [birth year], 63 y.o.   MRN: [id number]  HPI: Elizabeth Mata is a 63 y.o. female presenting for a 6 month routine follow-up.  Hyperlipidemia: Currently raking atorvastatin  80 mg and and zetia  10 mg daily at night. Denies any side effects to medication. LDL 6 months ago was 100. Plan to recheck today.   Prediabetes: Managed through diet and exercise. Endorses eating more vegetables and increasing her protein. Avoids processed foods and excess sugar in diet. Has been working on walking daily.   Elevated liver enzymes:  Slightly elevated for the past year. She  is asymptomatic. We will recheck levels today.   GI symptoms: Followed by Gastroenterology for irritable bowel syndrome, chronic abdominal pain, and duodenitis Taking Protonix  40 mg daily. Has upcoming GI appt December 2nd. No current issues.   Allergic Rhinitis: Takes allegra  and nasonex  nasal spray for symptom relief.    Migraines: Followed by Neurology. Taking Nurtec as needed and Topamax  200 mg twice a day. Requesting to see Neurology here in [place] for more convenience.    Bilateral hand arthritis: Reports cold weather has exacerbated arthirtis pain in hands.She has not been taking anything for symptom relief.   Resting tremor bilateral hands:  She does endorse a mild resting tremor in her hands, especially her left hand. She has been evaluated by Neurology for sx. She reports tremor is  intermittent and does not affect her daily activities.  She has noticed that caffeine intake is a trigger for her. She denies having issues with grip or dropping objects.   Vulvar Cancer: Patient reports she is seen by Oncology every 6 months. She gets routine Paps through Oncology.   Mammogram: She had mammogram done in  October 2025 and showed no evidence of malignancy. Repeat screening in one year per imaging note.             11/10/2024   10:38 AM 04/23/2024    2:04 PM 06/06/2023   10:52 AM  Depression screen PHQ 2/9  Decreased Interest 0 0 0  Down, Depressed, Hopeless 0 0 0  PHQ - 2 Score 0 0 0  Altered sleeping 0    Tired, decreased energy 0    Change in appetite 0    Feeling bad or failure about yourself  0    Trouble concentrating 0    Moving slowly or fidgety/restless 0    Suicidal thoughts 0    PHQ-9 Score 0    Difficult doing work/chores Not difficult at all      Relevant past medical, surgical, family and social history reviewed and updated as indicated. Interim medical history since our last visit reviewed. Allergies and medications reviewed and updated.  Review of Systems Ten systems reviewed and is negative except as mentioned in HPI      Objective:     BP 112/64   Pulse 83   Temp 98.4 F (36.9 C)   Resp 16   Ht 4' 11 (1.499 m)   Wt 134 lb 9.6 oz (61.1 kg)   SpO2 93%   BMI 27.19 kg/m    Wt Readings from Last 3 Encounters:  11/10/24 134 lb 9.6  oz (61.1 kg)  10/21/24 135 lb (61.2 kg)  10/13/24 135 lb 12.8 oz (61.6 kg)    Physical Exam Constitutional:      Appearance: Normal appearance.  HENT:     Head: Normocephalic and atraumatic.  Eyes:     Extraocular Movements: Extraocular movements intact.     Pupils: Pupils are equal, round, and reactive to light.  Cardiovascular:     Rate and Rhythm: Normal rate and regular rhythm.     Pulses: Normal pulses.     Heart sounds: Normal heart sounds.  Pulmonary:     Effort: Pulmonary effort is normal.     Breath sounds: Normal breath sounds.  Musculoskeletal:        General: Normal range of motion.     Cervical back: Normal range of motion and neck supple.  Skin:    General: Skin is warm and dry.  Neurological:     General: No focal deficit present.     Mental Status: She is alert and oriented to person, place,  and time.     Sensory: No sensory deficit.     Coordination: Coordination normal.  Psychiatric:        Mood and Affect: Mood normal.        Behavior: Behavior normal.        Thought Content: Thought content normal.        Judgment: Judgment normal.      Results for orders placed or performed during the hospital encounter of 10/21/24  Surgical pathology   Collection Time: 10/21/24 12:00 AM  Result Value Ref Range   SURGICAL PATHOLOGY      SURGICAL PATHOLOGY [facility] [address], [address] [place], [place] [address] Telephone [phone] or [phone] Fax [phone]  REPORT OF SURGICAL PATHOLOGY   Accession #: [id number] Patient Name: Elizabeth Mata, Elizabeth Mata Visit # : [id number]  MRN: [id number] Physician: Jinny Carmine DOB/Age 16-Jan-1961 (Age: 43) Gender: F Collected Date: 10/21/2024 Received Date: 10/21/2024  FINAL DIAGNOSIS       1. Small Intestine Biopsy, small bowel, cbx :       - GASTRIC MUCOSA WITH MILD REACTIVE GASTRITIS.      - NEGATIVE FOR H. PYLORI, INTESTINAL METAPLASIA, DYSPLASIA, AND MALIGNANCY.       2. Stomach, biopsy, cbx :       - DUODENAL MUCOSA WITH PROMINENT BRUNNER'S GLANDS AND NON-SPECIFIC REACTIVE      OVERLYING EPITHELIAL CHANGES.      - NEGATIVE FOR FEATURES OF CELIAC, DYSPLASIA, AND MALIGNANCY.       Diagnosis Note : The specimen containers and blocks were verified on this      case.The most likely explanation for the presence of gastric muc osa in the      specimen container labelled small intestine and small intestinal mucosa in the      specimen container labelled stomach is inadvertent switch in specimen      labelling at the time of collection.Ginger Feldpausch, CMA was notified of these      findings on 10/25/2024.      ELECTRONIC SIGNATURE : Rubinas Md, Rexene , Sports Administrator, Electronic Signature  MICROSCOPIC DESCRIPTION  CASE COMMENTS STAINS USED IN DIAGNOSIS: H&E H&E    CLINICAL  HISTORY  SPECIMEN(S) OBTAINED 1. Small Intestine Biopsy, Small Bowel, Cbx 2. Stomach, biopsy, Cbx  SPECIMEN COMMENTS: 1. R/O celiac 2. R/O h pylori SPECIMEN CLINICAL INFORMATION: 1. ABD pain, gastritis, duodenitis    Gross Description 1. Received in  formalin is a tan, soft tissue fragment that is submitted in toto.Size:  0.4 cm, 1 block submitted.mb 10-21-24 2. Received in formalin are tan, soft tissue fragments that are submitted in toto.Number: 6  Size:  0.4 cm, 1 block.mb 10-21-24        R eport signed out from the following location(s) [place] [address] [place], [place] [address] CLIA #: [id number]  [facility] [address] [place], [place] [address] CLIA #: [id number]           Assessment & Plan:   Problem List Items Addressed This Visit       Cardiovascular and Mediastinum   Carotid atherosclerosis, bilateral   Taking atorvastatin  80 mg and Zetia  10 mg at night. LDL 6 months ago was 100. Plan to recheck today. Discussed further treatment options if LDL is not at goal (<70)      Relevant Medications   atorvastatin  (LIPITOR) 80 MG tablet   ezetimibe  (ZETIA ) 10 MG tablet   Other Relevant Orders   Lipid panel   Migraine without aura and without status migrainosus, not intractable   Followed by Neurology. Taking Nurtec as needed along with Topamax  200 mg twice a day, which is managing her symptoms.       Relevant Medications   atorvastatin  (LIPITOR) 80 MG tablet   ezetimibe  (ZETIA ) 10 MG tablet   Other Relevant Orders   Ambulatory referral to Neurology     Respiratory   Allergic rhinitis   Taking allegra  and nasonex  for symptom relief which is effective in managing her allergic rhinitis.         Digestive   Irritable bowel syndrome with constipation   Other diseases of stomach and duodenum   Continue taking Protonix  40 mg daily. Follow-up with GI, appt scheduled for December 2nd.          Genitourinary   Vulvar cancer [facility])   Continue seeing Oncology        Other   History of stroke   Continue seeing Neurology. New Neurology referral placed for [facility] Neurology.       Mixed hyperlipidemia - Primary   Continue taking Atorvastatin  80 mg and Zetia  10 mg at night. Checking LDL today. LDL 6 months ago was 100. Discussed Repatha injection if LDL is not at goal. Continue working on reducing saturated fats in diet.       Relevant Medications   atorvastatin  (LIPITOR) 80 MG tablet   ezetimibe  (ZETIA ) 10 MG tablet   Other Relevant Orders   Lipid panel   CBC with Differential/Platelet   Prediabetes   Checking Hemoglobin A1C today. A1C at last appt was 6.0. Continue working on reducing processed foods and sugar in diet.       Relevant Orders   Hemoglobin A1c   CBC with Differential/Platelet   Comprehensive metabolic panel with GFR   Elevated liver enzymes   Rechecking liver enzymes today      Other Visit Diagnoses       Numbness and tingling of both feet       Checking B12 and iron panel today. Patient is followed by Neurology.   Relevant Orders   Vitamin B12   Iron, TIBC and Ferritin Panel       Continue medication regimen as prescribed. Refills sent in of Atorvastatin  80 mg and Zetia  10 mg. Attend your follow-up appointments with GI, Neurology, and Oncology. Discussed trying OTC topical voltaren  gel to help with  arthritis pain of hands. We will call patient with lab results when they have returned. If LDL is not at goal we briefly discussed trying Repatha injection for more optimal control.          Follow up plan: Return in about 6 months (around 05/10/2025).    I have reviewed this encounter including the documentation in this note and/or discussed this patient with the provider, Aislinn Womack, SNP, I am certifying that I agree with the content of this note as supervising/preceptor nurse practitioner.  Mliss Spray, FNP-C [facility] 11/10/2024, 12:20 PM

## 2024-11-10 NOTE — Assessment & Plan Note (Signed)
 Continue seeing Oncology

## 2024-11-10 NOTE — Assessment & Plan Note (Signed)
 Continue taking Atorvastatin  80 mg and Zetia  10 mg at night. Checking LDL today. LDL 6 months ago was 100. Discussed Repatha injection if LDL is not at goal. Continue working on reducing saturated fats in diet.

## 2024-11-10 NOTE — Assessment & Plan Note (Signed)
 Taking allegra  and nasonex  for symptom relief which is effective in managing her allergic rhinitis.

## 2024-11-10 NOTE — Assessment & Plan Note (Signed)
 Checking Hemoglobin A1C today. A1C at last appt was 6.0. Continue working on reducing processed foods and sugar in diet.

## 2024-11-10 NOTE — Assessment & Plan Note (Signed)
 Continue seeing Neurology. New Neurology referral placed for [facility].

## 2024-11-10 NOTE — Assessment & Plan Note (Signed)
 Followed by Neurology. Taking Nurtec as needed along with Topamax  200 mg twice a day, which is managing her symptoms.

## 2024-11-11 ENCOUNTER — Ambulatory Visit: Payer: Self-pay | Admitting: Nurse Practitioner

## 2024-11-11 LAB — CBC WITH DIFFERENTIAL/PLATELET
Absolute Lymphocytes: 2656 {cells}/uL (ref 850–3900)
Absolute Monocytes: 304 {cells}/uL (ref 200–950)
Basophils Absolute: 20 {cells}/uL (ref 0–200)
Basophils Relative: 0.4 %
Eosinophils Absolute: 29 {cells}/uL (ref 15–500)
Eosinophils Relative: 0.6 %
HCT: 41.7 % (ref 35.0–45.0)
Hemoglobin: 13.6 g/dL (ref 11.7–15.5)
MCH: 31.1 pg (ref 27.0–33.0)
MCHC: 32.6 g/dL (ref 32.0–36.0)
MCV: 95.2 fL (ref 80.0–100.0)
MPV: 9 fL (ref 7.5–12.5)
Monocytes Relative: 6.2 %
Neutro Abs: 1891 {cells}/uL (ref 1500–7800)
Neutrophils Relative %: 38.6 %
Platelets: 339 Thousand/uL (ref 140–400)
RBC: 4.38 Million/uL (ref 3.80–5.10)
RDW: 12.8 % (ref 11.0–15.0)
Total Lymphocyte: 54.2 %
WBC: 4.9 Thousand/uL (ref 3.8–10.8)

## 2024-11-11 LAB — COMPREHENSIVE METABOLIC PANEL WITH GFR
AG Ratio: 2.1 (calc) (ref 1.0–2.5)
ALT: 23 U/L (ref 6–29)
AST: 26 U/L (ref 10–35)
Albumin: 4.5 g/dL (ref 3.6–5.1)
Alkaline phosphatase (APISO): 73 U/L (ref 37–153)
BUN: 9 mg/dL (ref 7–25)
CO2: 21 mmol/L (ref 20–32)
Calcium: 10.3 mg/dL (ref 8.6–10.4)
Chloride: 112 mmol/L — ABNORMAL HIGH (ref 98–110)
Creat: 0.79 mg/dL (ref 0.50–1.05)
Globulin: 2.1 g/dL (ref 1.9–3.7)
Glucose, Bld: 94 mg/dL (ref 65–99)
Potassium: 4.2 mmol/L (ref 3.5–5.3)
Sodium: 143 mmol/L (ref 135–146)
Total Bilirubin: 0.7 mg/dL (ref 0.2–1.2)
Total Protein: 6.6 g/dL (ref 6.1–8.1)
eGFR: 85 mL/min/1.73m2 (ref >=60)

## 2024-11-11 LAB — LIPID PANEL
Cholesterol: 186 mg/dL (ref <200)
HDL: 75 mg/dL (ref >=50)
LDL Cholesterol (Calc): 96 mg/dL
Non-HDL Cholesterol (Calc): 111 mg/dL (ref <130)
Total CHOL/HDL Ratio: 2.5 (calc) (ref <5.0)
Triglycerides: 63 mg/dL (ref <150)

## 2024-11-11 LAB — HEMOGLOBIN A1C
Hgb A1c MFr Bld: 5.9 % — ABNORMAL HIGH (ref <5.7)
Mean Plasma Glucose: 123 mg/dL
eAG (mmol/L): 6.8 mmol/L

## 2024-11-11 LAB — IRON,TIBC AND FERRITIN PANEL
%SAT: 21 % (ref 16–45)
Ferritin: 72 ng/mL (ref 16–288)
Iron: 79 ug/dL (ref 45–160)
TIBC: 373 ug/dL (ref 250–450)

## 2024-11-11 LAB — VITAMIN B12: Vitamin B-12: 467 pg/mL (ref 200–1100)

## 2024-11-12 ENCOUNTER — Ambulatory Visit: Admitting: Family Medicine

## 2024-11-18 ENCOUNTER — Ambulatory Visit: Payer: Self-pay

## 2024-11-18 NOTE — Telephone Encounter (Signed)
 RN answered pt questions about B12 and cholesterol.   Copied from CRM [id number]. Topic: Clinical - Lab/Test Results >> Nov 18, 2024 10:14 AM Larissa RAMAN wrote: Reason for CRM: Patient has questions regarding lab results, cholesterol.

## 2024-11-29 NOTE — Progress Notes (Unsigned)
 11/30/2024 Elizabeth Mata [id number] [birth year]  Gastroenterology Office Note     Primary Care Physician:  Gareth Mliss FALCON, FNP  Primary GI Provider: Jinny Carmine, MD    Chief Complaint   Chief Complaint  Patient presents with   Follow-up    Pantoprazole  has helped     History of Present Illness   Elizabeth Mata is a 63 y.o. female presenting today for follow-up.  Patient last seen by myself on 10/13/2024 for dyspepsia, nausea, bloating, abdominal pain.  Was started on pantoprazole  40 mg once daily.   Today, patient reports she is doing much better.  EGD was performed on 10/21/2024.  She has been taking Protonix  daily and can tell a big difference.  Reports that her stomach is not hurting every day, bloating has also improved.  She does not eat a lot of fried fatty greasy foods and she continues to not take any NSAIDs. Denies nausea and vomiting.   Patient reports diarrhea has also improved, last bowel movement today.  She may still have loose stool some days but frequency is not as bad.  Patient did not complete stool studies that were ordered last visit.  She has not tried taking fiber.   Denies NSAID use. Denies fried, fatty greasy foods.   Patient last seen by myself on 10/13/2024 for dyspepsia, nausea, bloating, abdominal pain.  Was started on pantoprazole  40 mg once daily.   Kidney function stable, ALT mildly elevated at 37. CBC unremarkable. Celiac negative.   10/21/2024 EGD - Small hiatal hernia.  - Erythematous mucosa in the antrum. Biopsied. GASTRIC MUCOSA WITH MILD REACTIVE GASTRITIS. NEGATIVE FOR H. PYLORI, INTESTINAL METAPLASIA, DYSPLASIA, AND MALIGNANCY  - Duodenitis. Biopsied. NEGATIVE FOR FEATURES OF CELIAC, DYSPLASIA, AND MALIGNANCY.    12/27/2022: EGD: erosive gastritis. Negative for H. Pylori, intestinal metaplasia, and malignancy.    11/07/2022: CT abdomen pelvis shows no acute findings   12/28/20 EGD: Reactive gastropathy.  Colonoscopy: no  evidence of microscopic colitis.      Past Medical History:  Diagnosis Date   Abdominal pain 11/14/2011   Allergic rhinitis 06/30/2016   Allergy     Blurred vision 03/08/2014   Carotid atherosclerosis, bilateral 11/06/2017   US  Nov 2018   Cyst of breast, left, solitary    Encounter for other screening for malignant neoplasm of breast 04/26/2013   Encounter for screening, unspecified 11/22/2008   Esophageal reflux 11/02/2007   Flank pain    GERD (gastroesophageal reflux disease)    Glucose intolerance 04/13/2010   Hemorrhoid    Hemorrhoids    Hyperlipidemia    IFG (impaired fasting glucose) 07/18/2016   June 2017    Irritable bowel syndrome 12/09/2014   Kidney stone on left side 10/24/2015   Kidney stones    Knee pain 12/26/2011   Lateral epicondylitis 12/09/2014   Microscopic hematuria 11/02/2007   Nephrolithiasis 05/08/2012   Neuropathy 07/24/2016   Osteoarthritis 12/23/2007   Pelvic pain 05/04/2013   Seizures (HCC)    Shingles outbreak 02/03/2017   Shoulder pain 04/17/2012   Stomach irritation    Syncope    Recurrent over the last 2 years. had workup at [facility] this year with cardiology and neurology. Had echo and 3-week event monitor that were unremarkable. Has had negative EEG. Echo in 11/11 at [facility] showed no significant abnormalities. Possibly vasovagal   TIA (transient ischemic attack) 10/28/2017   Trigger thumb     Past Surgical History:  Procedure Laterality Date   ABDOMINAL HYSTERECTOMY  40s   endometriosis, fibroid tumors; no cancer, partial   BREAST EXCISIONAL BIOPSY Left 1980'S   EXCISIONAL - NEG   BREAST SURGERY     CHOLECYSTECTOMY  31   COLONOSCOPY WITH PROPOFOL  N/A 06/23/2019   Procedure: COLONOSCOPY WITH PROPOFOL ;  Surgeon: Janalyn Keene NOVAK, MD;  Location: [facility] ENDOSCOPY;  Service: Endoscopy;  Laterality: N/A;   COLONOSCOPY WITH PROPOFOL  N/A 12/28/2020   Procedure: COLONOSCOPY WITH PROPOFOL ;  Surgeon: Janalyn Keene NOVAK, MD;  Location: [facility]  ENDOSCOPY;  Service: Endoscopy;  Laterality: N/A;   ESOPHAGOGASTRODUODENOSCOPY N/A 10/21/2024   Procedure: EGD (ESOPHAGOGASTRODUODENOSCOPY);  Surgeon: Jinny Carmine, MD;  Location: [facility] ENDOSCOPY;  Service: Endoscopy;  Laterality: N/A;   ESOPHAGOGASTRODUODENOSCOPY (EGD) WITH PROPOFOL  N/A 06/23/2019   Procedure: ESOPHAGOGASTRODUODENOSCOPY (EGD) WITH PROPOFOL ;  Surgeon: Janalyn Keene NOVAK, MD;  Location: [facility] ENDOSCOPY;  Service: Endoscopy;  Laterality: N/A;   ESOPHAGOGASTRODUODENOSCOPY (EGD) WITH PROPOFOL  N/A 12/28/2020   Procedure: ESOPHAGOGASTRODUODENOSCOPY (EGD) WITH PROPOFOL ;  Surgeon: Janalyn Keene NOVAK, MD;  Location: [facility] ENDOSCOPY;  Service: Endoscopy;  Laterality: N/A;   ESOPHAGOGASTRODUODENOSCOPY (EGD) WITH PROPOFOL  N/A 12/27/2022   Procedure: ESOPHAGOGASTRODUODENOSCOPY (EGD) WITH PROPOFOL ;  Surgeon: Therisa Bi, MD;  Location: [facility] ENDOSCOPY;  Service: Gastroenterology;  Laterality: N/A;   FOOT SURGERY Right    tendon release   VULVECTOMY PARTIAL N/A 01/01/2023   Procedure: EXAM UNDER ANESTHESIA,VULVECTOMY PARTIAL, COLPOSCOPY;  Surgeon: Elby Webb Loges, MD;  Location: [facility] ORS;  Service: Gynecology;  Laterality: N/A;    Current Outpatient Medications  Medication Sig Dispense Refill   aspirin  EC 81 MG EC tablet Take 1 tablet (81 mg total) by mouth daily. 30 tablet 1   atorvastatin  (LIPITOR) 80 MG tablet Take 1 tablet (80 mg total) by mouth daily. 90 tablet 1   ezetimibe  (ZETIA ) 10 MG tablet Take 1 tablet (10 mg total) by mouth daily. 90 tablet 3   fexofenadine  (ALLEGRA ) 180 MG tablet Take 180 mg by mouth daily.     FLUCELVAX 0.5 ML injection      ibuprofen  (ADVIL ) 200 MG tablet Take 200 mg by mouth daily as needed for headache.     mometasone  (ELOCON ) 0.1 % cream Apply topically to flares at body qd/bid prn for up to 2 weeks. Avoid applying to face, groin, and axilla. Use as directed. 45 g 3   mometasone  (NASONEX ) 50 MCG/ACT nasal spray Place 2 sprays into the nose daily. 1  each 12   topiramate  (TOPAMAX ) 200 MG tablet Take 200 mg by mouth 2 (two) times daily.   11   pantoprazole  (PROTONIX ) 40 MG tablet Take 1 tablet (40 mg total) by mouth daily. 90 tablet 1   No current facility-administered medications for this visit.    Allergies as of 11/30/2024 - Review Complete 11/30/2024  Allergen Reaction Noted   Clindamycin /lincomycin Hives 11/10/2015   Flagyl  [metronidazole ] Hives 11/07/2010   Hydrocodone  Nausea Only 03/01/2015   Sulfa antibiotics Hives 07/05/2014   Roxicodone  [oxycodone ] Nausea And Vomiting 02/05/2017    Family History  Problem Relation Age of Onset   Coronary artery disease Maternal Uncle    Diabetes Mellitus II Mother    Arthritis Mother    Diabetes Maternal Uncle    Kidney cancer Father 58       died 05/13/17   Kidney disease Father    Cancer Father        kidney and bladder   Arthritis Maternal Uncle    Cancer Maternal Grandmother        stomach?   Jaundice  Maternal Grandmother    Stomach cancer Paternal Grandmother    Cancer Paternal Grandmother        stomach   Hypertension Sister    Stroke Maternal Grandfather    Diabetes Paternal Grandfather    Diabetes Sister    Hypertension Sister    Breast cancer Neg Hx    Bladder Cancer Neg Hx     Social History   Socioeconomic History   Marital status: Legally Separated    Spouse name: Not on file   Number of children: 2   Years of education: Not on file   Highest education level: GED or equivalent  Occupational History   Occupation: unemployed  Tobacco Use   Smoking status: Never    Passive exposure: Never   Smokeless tobacco: Never  Vaping Use   Vaping status: Never Used  Substance and Sexual Activity   Alcohol use: Yes    Comment: occasionally   Drug use: Never   Sexual activity: Yes    Partners: Male    Birth control/protection: Surgical    Comment: Hysterectomy  Other Topics Concern   Not on file  Social History Narrative   Regular exercise    Social Drivers of Health   Financial Resource Strain: Low Risk  (06/06/2023)   Overall Financial Resource Strain (CARDIA)    Difficulty of Paying Living Expenses: Not hard at all  Food Insecurity: No Food Insecurity (06/06/2023)   Hunger Vital Sign    Worried About Running Out of Food in the Last Year: Never true    Ran Out of Food in the Last Year: Never true  Transportation Needs: No Transportation Needs (06/06/2023)   PRAPARE - Administrator, Civil Service (Medical): No    Lack of Transportation (Non-Medical): No  Physical Activity: Sufficiently Active (06/06/2023)   Exercise Vital Sign    Days of Exercise per Week: 7 days    Minutes of Exercise per Session: 30 min  Stress: No Stress Concern Present (06/06/2023)   [employer] of Occupational Health - Occupational Stress Questionnaire    Feeling of Stress : Not at all  Social Connections: Moderately Isolated (06/06/2023)   Social Connection and Isolation Panel    Frequency of Communication with Friends and Family: More than three times a week    Frequency of Social Gatherings with Friends and Family: Three times a week    Attends Religious Services: More than 4 times per year    Active Member of Clubs or Organizations: No    Attends Banker Meetings: Never    Marital Status: Separated  Intimate Partner Violence: Not At Risk (06/06/2023)   Humiliation, Afraid, Rape, and Kick questionnaire    Fear of Current or Ex-Partner: No    Emotionally Abused: No    Physically Abused: No    Sexually Abused: No     RELEVANT GI HISTORY, IMAGING AND LABS: CBC    Component Value Date/Time   WBC 4.9 11/10/2024 1214   RBC 4.38 11/10/2024 1214   HGB 13.6 11/10/2024 1214   HGB 14.6 10/13/2024 1000   HCT 41.7 11/10/2024 1214   HCT 44.9 10/13/2024 1000   PLT 339 11/10/2024 1214   PLT 283 10/13/2024 1000   MCV 95.2 11/10/2024 1214   MCV 95 10/13/2024 1000   MCV 95 12/31/2014 1450   MCH 31.1 11/10/2024 1214   MCHC  32.6 11/10/2024 1214   RDW 12.8 11/10/2024 1214   RDW 12.7 10/13/2024 1000   RDW 13.1  12/31/2014 1450   LYMPHSABS 2.4 10/13/2024 1000   MONOABS 0.3 03/27/2024 1525   EOSABS 29 11/10/2024 1214   EOSABS 0.0 10/13/2024 1000   BASOSABS 20 11/10/2024 1214   BASOSABS 0.0 10/13/2024 1000   Recent Labs    03/27/24 1525 03/27/24 1528 10/13/24 1000 11/10/24 1214  HGB 13.7 13.6 14.6 13.6    CMP     Component Value Date/Time   NA 143 11/10/2024 1214   NA 143 10/13/2024 1000   NA 144 12/31/2014 1450   K 4.2 11/10/2024 1214   K 3.6 12/31/2014 1450   CL 112 (H) 11/10/2024 1214   CL 113 (H) 12/31/2014 1450   CO2 21 11/10/2024 1214   CO2 21 12/31/2014 1450   GLUCOSE 94 11/10/2024 1214   GLUCOSE 107 (H) 12/31/2014 1450   BUN 9 11/10/2024 1214   BUN 10 10/13/2024 1000   BUN 17 12/31/2014 1450   CREATININE 0.79 11/10/2024 1214   CALCIUM  10.3 11/10/2024 1214   CALCIUM  9.4 12/31/2014 1450   PROT 6.6 11/10/2024 1214   PROT 6.7 10/13/2024 1000   PROT 7.8 05/03/2012 0903   ALBUMIN 4.5 10/13/2024 1000   ALBUMIN 3.8 05/03/2012 0903   AST 26 11/10/2024 1214   AST 30 05/03/2012 0903   ALT 23 11/10/2024 1214   ALT 25 05/03/2012 0903   ALKPHOS 100 10/13/2024 1000   ALKPHOS 88 05/03/2012 0903   BILITOT 0.7 11/10/2024 1214   BILITOT 0.5 10/13/2024 1000   BILITOT 0.6 05/03/2012 0903   GFRNONAA >60 03/27/2024 1525   GFRNONAA 70 05/18/2019 1137   GFRAA >60 08/05/2020 0533   GFRAA 81 05/18/2019 1137      Latest Ref Rng & Units 11/10/2024   12:14 PM 10/13/2024   10:00 AM 04/23/2024    2:55 PM  Hepatic Function  Total Protein 6.1 - 8.1 g/dL 6.6  6.7  6.7   Albumin 3.9 - 4.9 g/dL  4.5    AST 10 - 35 U/L 26  36  29   ALT 6 - 29 U/L 23  37  31   Alk Phosphatase 49 - 135 IU/L  100    Total Bilirubin 0.2 - 1.2 mg/dL 0.7  0.5  0.7       Review of Systems   All systems reviewed and negative except where noted in HPI.    Physical Exam  BP 110/73   Pulse 77   Temp 98 F (36.7 C)    Ht 4' 11 (1.499 m)   Wt 133 lb 12.8 oz (60.7 kg)   SpO2 99%   BMI 27.02 kg/m  No LMP recorded. Patient has had a hysterectomy. General:   Alert and oriented. Pleasant and cooperative. Well-nourished and well-developed.  Head:  Normocephalic and atraumatic. Eyes:  Without icterus Ears:  Normal auditory acuity. Abdomen:  Normal bowel sounds.  No bruits.  Soft, non-tender and non-distended without masses, hepatosplenomegaly or hernias noted.  No guarding or rebound tenderness.   Rectal:  Deferred. Msk:  Symmetrical without gross deformities. Normal posture. Extremities:  Without edema. Neurologic:  Alert and  oriented x4;  grossly normal neurologically. Skin:  Intact without significant lesions or rashes. Psych:  Alert and cooperative. Normal mood and affect.   Assessment & Plan   KATARINA RIEBE is a 63 y.o. female presenting today for follow-up on dyspepsia, bloating, abdominal pain, diarrhea.  Dyspepsia with nausea, bloating, abdominal pain.  Symptoms have improved.  - Discussed small hiatal hernia noted on EGD.  Discussed lifestyle modifications: Avoid trigger foods, eat smaller meals throughout the day, no eating within 3 hours of bed. - continue pantoprazole  40 mg once daily. Refill sent in to pharmacy.   Diarrhea.  Still may have loose stools but frequency has decreased. - Start taking Benefiber and drink plenty of water.   - can take imodium  if needed.  - Submit stool studies  Follow up in 3 months.   Elizabeth Bohr, Elizabeth Mata, [facility]

## 2024-11-30 ENCOUNTER — Ambulatory Visit

## 2024-11-30 ENCOUNTER — Encounter: Payer: Self-pay | Admitting: Family Medicine

## 2024-11-30 ENCOUNTER — Ambulatory Visit: Admitting: Family Medicine

## 2024-11-30 VITALS — BP 110/73 | HR 77 | Temp 98.0°F | Ht 59.0 in | Wt 133.8 lb

## 2024-11-30 DIAGNOSIS — K30 Functional dyspepsia: Secondary | ICD-10-CM

## 2024-11-30 DIAGNOSIS — E538 Deficiency of other specified B group vitamins: Secondary | ICD-10-CM

## 2024-11-30 DIAGNOSIS — R110 Nausea: Secondary | ICD-10-CM

## 2024-11-30 DIAGNOSIS — R197 Diarrhea, unspecified: Secondary | ICD-10-CM

## 2024-11-30 DIAGNOSIS — R1084 Generalized abdominal pain: Secondary | ICD-10-CM

## 2024-11-30 MED ORDER — PANTOPRAZOLE SODIUM 40 MG PO TBEC: 40.0000 mg | DELAYED_RELEASE_TABLET | Freq: Every day | ORAL | 1 refills | Status: DC

## 2024-11-30 MED ORDER — CYANOCOBALAMIN 1000 MCG/ML IJ SOLN
1000.0000 ug | Freq: Once | INTRAMUSCULAR | Status: AC
  Administered 2024-11-30: 1000 ug via INTRAMUSCULAR

## 2024-11-30 NOTE — Patient Instructions (Addendum)
 Please start taking your fiber and drink plenty of water.

## 2024-12-31 ENCOUNTER — Ambulatory Visit

## 2024-12-31 DIAGNOSIS — E538 Deficiency of other specified B group vitamins: Secondary | ICD-10-CM | POA: Diagnosis not present

## 2024-12-31 MED ORDER — CYANOCOBALAMIN 1000 MCG/ML IJ SOLN
1000.0000 ug | Freq: Once | INTRAMUSCULAR | Status: AC
  Administered 2024-12-31: 1000 ug via INTRAMUSCULAR

## 2025-01-04 LAB — GI PROFILE, STOOL, PCR

## 2025-01-04 LAB — STOOL CULTURE: E coli, Shiga toxin Assay: NEGATIVE

## 2025-01-04 LAB — PANCREATIC ELASTASE, FECAL: Pancreatic Elastase, Fecal: 428 ug Elast./g

## 2025-01-04 LAB — SPECIMEN STATUS REPORT

## 2025-01-05 ENCOUNTER — Inpatient Hospital Stay: Attending: Obstetrics and Gynecology | Admitting: Obstetrics and Gynecology

## 2025-01-05 VITALS — BP 122/79 | HR 53 | Temp 97.0°F | Ht 59.0 in | Wt 132.0 lb

## 2025-01-05 DIAGNOSIS — L910 Hypertrophic scar: Secondary | ICD-10-CM | POA: Insufficient documentation

## 2025-01-05 DIAGNOSIS — C519 Malignant neoplasm of vulva, unspecified: Secondary | ICD-10-CM

## 2025-01-05 DIAGNOSIS — Z8544 Personal history of malignant neoplasm of other female genital organs: Secondary | ICD-10-CM | POA: Diagnosis not present

## 2025-01-05 NOTE — Progress Notes (Signed)
 Gynecologic Oncology Interval Visit   Referring Provider: [name], PA   Chief Concern: SCC of vulva  Subjective:  Elizabeth Mata is a 64 y.o. female who is seen in consultation from Alicia Copland, [place] [facility]) for newly diagnosed squamous cell carcinoma of the vulva, s/p WLE on 01/01/23.  She presents today for follow up vulvar cancer surveillance.  She is occasionally sexually active without pain. She was last seen by Dr Mancil for surveillance in July 2025. NED at that time.   We previously discussed hot flashes and she was provided prescription for venlafaxine  37.5 mg but decided not to start it. Her hot flashes have subsided.    Gynecologic Oncology History:  64 y.o. G2P2 whose LMP was No LMP recorded. Patient has had a hysterectomy., presented for enlarging labial lesion. She had noticed what she thought was ingrown hair for several months which then became painful and enlarging. She attempted home I&D then sought evaluation with gynecology.     12/03/2022 biopsy performed - Specimen was 0.3 x 0.3 x 0.2 cm fragment  PATHOLOGY: A. LABIA, RIGHT LESION, BIOPSY: - Moderately differentiated squamous cell carcinoma - Carcinoma is present at biopsy edge  11/10/2022 CT A/P IMPRESSION: No acute findings in the abdomen or pelvis.   HIV nonreactive 08/09/2019  No gross lesion identified on initial exam with Dr. Elby.   01/01/23- EUA, colposcopy, and WLE of vulva. Lesion visualized during colposcopy. Defect was approximately 3 x 1.5 cm. Stitch placed at 12:00.   Pathology:  A. VULVA, RIGHT; BIOPSY:  - DERMAL FIBROSIS, CONSISTENT WITH PREVIOUS BIOPSY SITE.  - NO RESIDUAL SQUAMOUS CELL CARCINOMA IDENTIFIED (REF OUTSIDE PATHOLOGY  REPORT; [phone]).   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    01/2023 Postop visit. Incision noted to be well healed. Recommended observation and review of pathology at Tumor Board.    07/2023 At her request we asked Pathology to review her vulvar biopsy. Dr. [name] has reviewed 12/03/2022 biopsy and confirmed pathology consistent with malignancy.   [name] reviewed recommendations with dermatology and they recommended a 3 month treatment of twice a day Serica for [place].   09/03/2023 Vulvar biopsies 1. Vulva, biopsy, Right lower labia: - SQUAMOUS MUCOSA WITH FIBROSIS, MINIMAL CHRONIC INFLAMMATION AND HYPERGRANULOSIS. NO DYSPLASIA OR MALIGNANCY.  2. Vulva, biopsy, Right middle labia - SQUAMOUS MUCOSA WITH FIBROSIS, MINIMAL CHRONIC INFLAMMATION AND HYPERGRANULOSIS. NO DYSPLASIA OR MALIGNANCY. - SEE COMMENT.  3. Vulva, biopsy, Right upper labia - SQUAMOUS MUCOSA WITH FIBROSIS, MINIMAL CHRONIC INFLAMMATION AND HYPERGRANULOSIS. NO DYSPLASIA OR MALIGNANCY.  Microscopic Comment 1. -3. COMMENT: The findings are non specific and appear reactive.  Problem List: Patient Active Problem List   Diagnosis Date Noted   Duodenitis 10/21/2024   Prediabetes 04/23/2024   Elevated liver enzymes 04/23/2024   Migraine without aura and without status migrainosus, not intractable 04/23/2024   Vulvar cancer (HCC) 12/18/2022   Mixed hyperlipidemia 09/16/2022   Pelvic floor weakness in female 03/03/2020   Polyp of ascending colon    Other diseases of stomach and duodenum    Abdominal pain, chronic, epigastric    Carotid atherosclerosis, bilateral 11/06/2017   History of stroke 10/28/2017   Skin pigmentation disorder 07/24/2016   Allergic rhinitis 06/30/2016   Eczema 09/13/2015   Irritable bowel syndrome with constipation 09/13/2015   Bickerstaff's migraine 07/06/2012   Generalized abdominal pain 11/14/2011   Routine general medical examination at a health care facility 11/22/2008  Past Medical History: Past Medical History:  Diagnosis Date   Abdominal pain 11/14/2011   Allergic rhinitis 06/30/2016   Allergy     Blurred vision 03/08/2014   Carotid  atherosclerosis, bilateral 11/06/2017   US  Nov 2018   Cyst of breast, left, solitary    Encounter for other screening for malignant neoplasm of breast 04/26/2013   Encounter for screening, unspecified 11/22/2008   Esophageal reflux 11/02/2007   Flank pain    GERD (gastroesophageal reflux disease)    Glucose intolerance 04/13/2010   Hemorrhoid    Hemorrhoids    Hyperlipidemia    IFG (impaired fasting glucose) 07/18/2016   June 2017    Irritable bowel syndrome 12/09/2014   Kidney stone on left side 10/24/2015   Kidney stones    Knee pain 12/26/2011   Lateral epicondylitis 12/09/2014   Microscopic hematuria 11/02/2007   Nephrolithiasis 05/08/2012   Neuropathy 07/24/2016   Osteoarthritis 12/23/2007   Pelvic pain 05/04/2013   Seizures (HCC)    Shingles outbreak 02/03/2017   Shoulder pain 04/17/2012   Stomach irritation    Syncope    Recurrent over the last 2 years. had workup at [facility] this year with cardiology and neurology. Had echo and 3-week event monitor that were unremarkable. Has had negative EEG. Echo in 11/11 at [facility] showed no significant abnormalities. Possibly vasovagal   TIA (transient ischemic attack) 10/28/2017   Trigger thumb     Past Surgical History: Past Surgical History:  Procedure Laterality Date   ABDOMINAL HYSTERECTOMY  40s   endometriosis, fibroid tumors; no cancer, partial   BREAST EXCISIONAL BIOPSY Left 1980'S   EXCISIONAL - NEG   BREAST SURGERY     CHOLECYSTECTOMY  31   COLONOSCOPY WITH PROPOFOL  N/A 06/23/2019   Procedure: COLONOSCOPY WITH PROPOFOL ;  Surgeon: Janalyn Keene NOVAK, MD;  Location: [facility] ENDOSCOPY;  Service: Endoscopy;  Laterality: N/A;   COLONOSCOPY WITH PROPOFOL  N/A 12/28/2020   Procedure: COLONOSCOPY WITH PROPOFOL ;  Surgeon: Janalyn Keene NOVAK, MD;  Location: [facility] ENDOSCOPY;  Service: Endoscopy;  Laterality: N/A;   ESOPHAGOGASTRODUODENOSCOPY N/A 10/21/2024   Procedure: EGD (ESOPHAGOGASTRODUODENOSCOPY);  Surgeon: Jinny Carmine, MD;   Location: [facility] ENDOSCOPY;  Service: Endoscopy;  Laterality: N/A;   ESOPHAGOGASTRODUODENOSCOPY (EGD) WITH PROPOFOL  N/A 06/23/2019   Procedure: ESOPHAGOGASTRODUODENOSCOPY (EGD) WITH PROPOFOL ;  Surgeon: Janalyn Keene NOVAK, MD;  Location: [facility] ENDOSCOPY;  Service: Endoscopy;  Laterality: N/A;   ESOPHAGOGASTRODUODENOSCOPY (EGD) WITH PROPOFOL  N/A 12/28/2020   Procedure: ESOPHAGOGASTRODUODENOSCOPY (EGD) WITH PROPOFOL ;  Surgeon: Janalyn Keene NOVAK, MD;  Location: [facility] ENDOSCOPY;  Service: Endoscopy;  Laterality: N/A;   ESOPHAGOGASTRODUODENOSCOPY (EGD) WITH PROPOFOL  N/A 12/27/2022   Procedure: ESOPHAGOGASTRODUODENOSCOPY (EGD) WITH PROPOFOL ;  Surgeon: Therisa Bi, MD;  Location: [facility] ENDOSCOPY;  Service: Gastroenterology;  Laterality: N/A;   FOOT SURGERY Right    tendon release   VULVECTOMY PARTIAL N/A 01/01/2023   Procedure: EXAM UNDER ANESTHESIA,VULVECTOMY PARTIAL, COLPOSCOPY;  Surgeon: Elby Webb Loges, MD;  Location: [facility] ORS;  Service: Gynecology;  Laterality: N/A;    Past Gynecologic History:  As per HPI Mammogram 10/07/2024 negative BI-RADS 1  OB History:  OB History  Gravida Para Term Preterm AB Living  2 2    2   SAB IAB Ectopic Multiple Live Births          # Outcome Date GA Lbr Len/2nd Weight Sex Type Anes PTL Lv  2 Para           1 Para             Obstetric Comments  Menstrual age: 7    Age 1st Pregnancy: 34    Family History: Family History  Problem Relation Age of Onset   Coronary artery disease Maternal Uncle    Diabetes Mellitus II Mother    Arthritis Mother    Diabetes Maternal Uncle    Kidney cancer Father 37       died 25-Apr-2017   Kidney disease Father    Cancer Father        kidney and bladder   Arthritis Maternal Uncle    Cancer Maternal Grandmother        stomach?   Jaundice Maternal Grandmother    Stomach cancer Paternal Grandmother    Cancer Paternal Grandmother        stomach   Hypertension Sister    Stroke Maternal Grandfather     Diabetes Paternal Grandfather    Diabetes Sister    Hypertension Sister    Breast cancer Neg Hx    Bladder Cancer Neg Hx     Social History: Social History   Socioeconomic History   Marital status: Legally Separated    Spouse name: Not on file   Number of children: 2   Years of education: Not on file   Highest education level: GED or equivalent  Occupational History   Occupation: unemployed  Tobacco Use   Smoking status: Never    Passive exposure: Never   Smokeless tobacco: Never  Vaping Use   Vaping status: Never Used  Substance and Sexual Activity   Alcohol use: Yes    Comment: occasionally   Drug use: Never   Sexual activity: Yes    Partners: Male    Birth control/protection: Surgical    Comment: Hysterectomy  Other Topics Concern   Not on file  Social History Narrative   Regular exercise   Social Drivers of Health   Tobacco Use: Low Risk (01/05/2025)   Patient History    Smoking Tobacco Use: Never    Smokeless Tobacco Use: Never    Passive Exposure: Never  Financial Resource Strain: Low Risk (06/06/2023)   Overall Financial Resource Strain (CARDIA)    Difficulty of Paying Living Expenses: Not hard at all  Food Insecurity: No Food Insecurity (06/06/2023)   Hunger Vital Sign    Worried About Running Out of Food in the Last Year: Never true    Ran Out of Food in the Last Year: Never true  Transportation Needs: No Transportation Needs (06/06/2023)   PRAPARE - Administrator, Civil Service (Medical): No    Lack of Transportation (Non-Medical): No  Physical Activity: Sufficiently Active (06/06/2023)   Exercise Vital Sign    Days of Exercise per Week: 7 days    Minutes of Exercise per Session: 30 min  Stress: No Stress Concern Present (06/06/2023)   [employer] of Occupational Health - Occupational Stress Questionnaire    Feeling of Stress : Not at all  Social Connections: Moderately Isolated (06/06/2023)   Social Connection and Isolation Panel     Frequency of Communication with Friends and Family: More than three times a week    Frequency of Social Gatherings with Friends and Family: Three times a week    Attends Religious Services: More than 4 times per year    Active Member of Clubs or Organizations: No    Attends Banker Meetings: Never    Marital Status: Separated  Intimate Partner Violence: Not At Risk (06/06/2023)   Humiliation, Afraid, Rape, and Kick questionnaire  Fear of Current or Ex-Partner: No    Emotionally Abused: No    Physically Abused: No    Sexually Abused: No  Depression (PHQ2-9): Low Risk (01/05/2025)   Depression (PHQ2-9)    PHQ-2 Score: 0  Alcohol Screen: Low Risk (04/23/2024)   Alcohol Screen    Last Alcohol Screening Score (AUDIT): 0  Housing: Low Risk (05/09/2022)   Housing    Last Housing Risk Score: 0  Utilities: Not At Risk (06/06/2023)   AHC Utilities    Threatened with loss of utilities: No  Health Literacy: Not on file    Allergies: Allergies  Allergen Reactions   Clindamycin /Lincomycin Hives   Flagyl  [Metronidazole ] Hives   Hydrocodone  Nausea Only   Sulfa Antibiotics Hives   Roxicodone  [Oxycodone ] Nausea And Vomiting    Current Medications: Current Outpatient Medications  Medication Sig Dispense Refill   aspirin  EC 81 MG EC tablet Take 1 tablet (81 mg total) by mouth daily. 30 tablet 1   atorvastatin  (LIPITOR) 80 MG tablet Take 1 tablet (80 mg total) by mouth daily. 90 tablet 1   ezetimibe  (ZETIA ) 10 MG tablet Take 1 tablet (10 mg total) by mouth daily. 90 tablet 3   fexofenadine  (ALLEGRA ) 180 MG tablet Take 180 mg by mouth daily.     FLUCELVAX 0.5 ML injection      ibuprofen  (ADVIL ) 200 MG tablet Take 200 mg by mouth daily as needed for headache.     mometasone  (ELOCON ) 0.1 % cream Apply topically to flares at body qd/bid prn for up to 2 weeks. Avoid applying to face, groin, and axilla. Use as directed. 45 g 3   mometasone  (NASONEX ) 50 MCG/ACT nasal spray Place 2 sprays  into the nose daily. 1 each 12   naproxen  (NAPROSYN ) 500 MG tablet NAPROSYN  500 MG ORAL TABLET     pantoprazole  (PROTONIX ) 40 MG tablet Take 1 tablet (40 mg total) by mouth daily. 90 tablet 1   topiramate  (TOPAMAX ) 200 MG tablet Take 200 mg by mouth 2 (two) times daily.   11   No current facility-administered medications for this visit.    Review of Systems General:  fatigue o/w no complaints.  Her primary care doctor started her on B12 Skin: no complaints Eyes: no complaints HEENT: no complaints Breasts: no complaints Pulmonary: no complaints Cardiac: no complaints Gastrointestinal: bloated x 2 years; possible constipation o/w no complaints. She has a GI.  Genitourinary/Sexual: no complaints Ob/Gyn: no complaints Musculoskeletal: no complaints Hematology: no complaints Neurologic/Psych: [place] sad otherwise no complaints   Objective:  Physical Examination:  BP 122/79 (BP Location: Left Arm, Patient Position: Sitting)   Pulse (!) 53   Temp (!) 97 F (36.1 C) (Tympanic)   Ht 4' 11 (1.499 m)   Wt 132 lb (59.9 kg)   SpO2 100%   BMI 26.66 kg/m    GENERAL: Patient is a well appearing female in no acute distress HEENT:  Atraumatic normocephalic LUNGS: Normal respiratory effort LYMPH NODES: No supraclavicular axillary or inguinal adenopathy ABDOMEN:  Soft, nontender, nondistended. No masses or ascites.   EXTREMITIES:  No peripheral edema.   NEURO:  Nonfocal. Well oriented.  Appropriate affect.  Pelvic: chaperoned by CMA EGBUS: Right vulvar at 7-8 o'clock 2-3 cm in length the keloid is not as prominent. 1 cm firmness stable Vagina: there is 1 visible paravaginal cystic lesions ~ 8 mm in size.  [place] tumor noted.  I was not able to see or palpate the second 1 on today's visit Uterus:  surgically absent.  BME: palpableparavaginal cystic lesions ~ 8 mm in size along the right vaginal apex.  I could not palpate the 1 on the right vaginal sidewall as previously felt.  Lab  Review Per hpi  Radiologic Imaging: Per HPI     Assessment:  Elizabeth Mata is a 64 y.o. 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 Biopsy of small lesion 12/23 showed squamous cell cancer.  Entire biopsy was only 3 mm, so depth of invasion unclear.  01/01/23- EUA, colposcopy, and WLE of vulva. No residual dysplasia or cancer in the pathology specimen. 07/2023 concern for keloid formation, biopsies all negative. Stopped topical Serica on vulva because did not feel she needed it anymore. No new complaints and NED on exam.   H/o hypercalcemia  Hot flashes resolved  Chronic bloating and possible constipation  Medical co-morbidities complicating care: h/o Carotid atherosclerosis, bilateral with TIA 2018; Hyperlipidemia and h/o seizures on therapy Plan:   Problem List Items Addressed This Visit       Genitourinary   Vulvar cancer (HCC) - Primary   Other Visit Diagnoses       Keloid           Continue surveillance with RTC in 6 months.     Recommended that she follow-up with her primary care doctor with regard to management of fatigue as well as her gastroenterologist regarding the management of bloating and possible constipation.  The patient's diagnosis, an outline of the further diagnostic and laboratory studies which will be required, the recommendation, and alternatives were discussed.  All questions were answered to the patient's satisfaction.  I personally interviewed and examined the patient. Agreed with the above/below plan of care. I have directly contributed to assessment and plan of care of this patient and educated and discussed with patient and family.   I personally had the patient interaction and performed performed all the physical exam. I agree with the above documentation, assessment and plan which was fully formulated by me.  Counseling was completed by me. [name], DNP, AGNP-C, AOCNP helped scribe  the note   Dewan Emond Isidor Constable, MD

## 2025-01-10 ENCOUNTER — Telehealth: Payer: Self-pay | Admitting: *Deleted

## 2025-01-10 NOTE — Telephone Encounter (Signed)
 Patient called office stating that pantoprazole  (PROTONIX ) 40 MG is not working as well as patient first taken it. Also patient noticed symptoms of dry mouth and constipation. Should patient just stop or is there something else patient can take instead?  Please advise.

## 2025-01-12 ENCOUNTER — Other Ambulatory Visit: Payer: Self-pay | Admitting: Family Medicine

## 2025-01-12 ENCOUNTER — Telehealth: Payer: Self-pay

## 2025-01-12 MED ORDER — PANTOPRAZOLE SODIUM 40 MG PO TBEC: 40.0000 mg | DELAYED_RELEASE_TABLET | Freq: Two times a day (BID) | ORAL | 0 refills | Status: AC

## 2025-01-12 NOTE — Telephone Encounter (Signed)
 Patient stated the Protonix  isn't working that well and that she notices she is having the reflux returning- also dry mouth- we discussed taking a capful of miralax daily for some constipation that she is having -also may add in benefiber- instructed to increase fluids - I let her know to check with her pharmacy later today and to call and let us  know how the new medication is working.   Patient sent a message about Protonix  not working as well for her.  I tried to call her to discuss and left a message.  If she calls back, please see what symptoms she is having.  She also mentions dry mouth and that can be a side effect of PPIs.  We can try to change her to a different PPI or do Protonix  twice a day if she is having more reflux.  We can just wait to hear back from her thanks.

## 2025-01-12 NOTE — Telephone Encounter (Signed)
 Spoke with patient -per Robin increase Protonix  to twice a day-new protonix  RX sent to pharmacy.

## 2025-01-12 NOTE — Progress Notes (Signed)
 Patient called the office with reports of having breakthrough reflux. Will increase Protonix  to twice daily.  If that is not sufficient, we will try different PPI.

## 2025-01-17 NOTE — Telephone Encounter (Signed)
 Patient called office at this time- she stated she picked up the new prescription for protonix  and it stated to take once daily- 90 day supply- I let her know to take twice a day -30 minutes prior to breakfast and the 30 minutes prior to dinner and to not eat dinner 3 hours prior to bedtime- I let her know that her insurance probably wouldn't allow another prescription this soon and to call when she is about a week away from needing the medication and we can send new RX.

## 2025-01-26 ENCOUNTER — Ambulatory Visit

## 2025-02-28 ENCOUNTER — Ambulatory Visit: Admitting: Family Medicine

## 2025-05-10 ENCOUNTER — Ambulatory Visit: Admitting: Nurse Practitioner

## 2025-07-06 ENCOUNTER — Inpatient Hospital Stay
